# Patient Record
Sex: Female | Born: 1945 | Race: White | Hispanic: No | Smoking: Never smoker
Health system: Southern US, Community
[De-identification: ages and names within clinical notes are randomized; demographics above are authoritative.]

## PROBLEM LIST (undated history)

## (undated) DIAGNOSIS — E039 Hypothyroidism, unspecified: Secondary | ICD-10-CM

## (undated) DIAGNOSIS — N289 Disorder of kidney and ureter, unspecified: Secondary | ICD-10-CM

## (undated) DIAGNOSIS — F32A Depression, unspecified: Secondary | ICD-10-CM

## (undated) DIAGNOSIS — C9000 Multiple myeloma not having achieved remission: Secondary | ICD-10-CM

## (undated) DIAGNOSIS — K219 Gastro-esophageal reflux disease without esophagitis: Secondary | ICD-10-CM

## (undated) DIAGNOSIS — F419 Anxiety disorder, unspecified: Secondary | ICD-10-CM

## (undated) DIAGNOSIS — F329 Major depressive disorder, single episode, unspecified: Secondary | ICD-10-CM

## (undated) DIAGNOSIS — G8929 Other chronic pain: Secondary | ICD-10-CM

## (undated) DIAGNOSIS — N3281 Overactive bladder: Secondary | ICD-10-CM

## (undated) HISTORY — PX: LIMBAL STEM CELL TRANSPLANT: SHX1969

## (undated) HISTORY — PX: TONSILLECTOMY: SUR1361

## (undated) HISTORY — PX: CHOLECYSTECTOMY: SHX55

## (undated) HISTORY — PX: LEG SURGERY: SHX1003

## (undated) HISTORY — PX: BREAST SURGERY: SHX581

## (undated) HISTORY — PX: ABDOMINAL HYSTERECTOMY: SHX81

---

## 2007-07-31 ENCOUNTER — Ambulatory Visit: Payer: Self-pay | Admitting: Hematology & Oncology

## 2007-09-08 LAB — CBC WITH DIFFERENTIAL/PLATELET
BASO%: 0 % (ref 0.0–2.0)
LYMPH%: 23.3 % (ref 14.0–48.0)
MCHC: 34.6 g/dL (ref 32.0–36.0)
MONO#: 0.6 10*3/uL (ref 0.1–0.9)
Platelets: 290 10*3/uL (ref 145–400)
RBC: 3.35 10*6/uL — ABNORMAL LOW (ref 3.70–5.32)
RDW: 14.1 % (ref 11.3–14.5)
WBC: 4.7 10*3/uL (ref 3.9–10.0)

## 2007-09-08 LAB — CHCC SMEAR

## 2007-09-12 LAB — SPEP & IFE WITH QIG
Beta Globulin: 5.9 % (ref 4.7–7.2)
IgA: 9 mg/dL — ABNORMAL LOW (ref 68–378)
IgG (Immunoglobin G), Serum: 826 mg/dL (ref 694–1618)
Total Protein, Serum Electrophoresis: 6.3 g/dL (ref 6.0–8.3)

## 2007-09-12 LAB — COMPREHENSIVE METABOLIC PANEL
ALT: 21 U/L (ref 0–35)
AST: 17 U/L (ref 0–37)
Calcium: 9.3 mg/dL (ref 8.4–10.5)
Chloride: 105 mEq/L (ref 96–112)
Creatinine, Ser: 1.11 mg/dL (ref 0.40–1.20)
Total Bilirubin: 0.3 mg/dL (ref 0.3–1.2)

## 2007-10-04 ENCOUNTER — Ambulatory Visit: Payer: Self-pay | Admitting: Hematology & Oncology

## 2007-10-06 LAB — CBC & DIFF AND RETIC
BASO%: 0.4 % (ref 0.0–2.0)
EOS%: 2.9 % (ref 0.0–7.0)
LYMPH%: 25.4 % (ref 14.0–48.0)
MCH: 31 pg (ref 26.0–34.0)
MCHC: 34.4 g/dL (ref 32.0–36.0)
MONO#: 0.6 10*3/uL (ref 0.1–0.9)
Platelets: 330 10*3/uL (ref 145–400)
RBC: 3.3 10*6/uL — ABNORMAL LOW (ref 3.70–5.32)
Retic %: 2.2 % (ref 0.4–2.3)
WBC: 4.3 10*3/uL (ref 3.9–10.0)

## 2007-10-06 LAB — CHCC SMEAR

## 2007-10-10 LAB — COMPREHENSIVE METABOLIC PANEL
Albumin: 4.1 g/dL (ref 3.5–5.2)
Alkaline Phosphatase: 62 U/L (ref 39–117)
BUN: 18 mg/dL (ref 6–23)
Glucose, Bld: 134 mg/dL — ABNORMAL HIGH (ref 70–99)
Total Bilirubin: 0.5 mg/dL (ref 0.3–1.2)

## 2007-10-10 LAB — PROTEIN ELECTROPHORESIS, SERUM
Albumin ELP: 60.3 % (ref 55.8–66.1)
Alpha-1-Globulin: 5.7 % — ABNORMAL HIGH (ref 2.9–4.9)
Gamma Globulin: 10.2 % — ABNORMAL LOW (ref 11.1–18.8)

## 2007-10-10 LAB — IGG, IGA, IGM
IgA: 17 mg/dL — ABNORMAL LOW (ref 68–378)
IgM, Serum: 53 mg/dL — ABNORMAL LOW (ref 60–263)

## 2007-10-10 LAB — KAPPA/LAMBDA LIGHT CHAINS: Kappa free light chain: 5.94 mg/dL — ABNORMAL HIGH (ref 0.33–1.94)

## 2007-10-27 LAB — BASIC METABOLIC PANEL
CO2: 28 mEq/L (ref 19–32)
Calcium: 9.7 mg/dL (ref 8.4–10.5)
Creatinine, Ser: 1.08 mg/dL (ref 0.40–1.20)
Sodium: 143 mEq/L (ref 135–145)

## 2007-10-27 LAB — CBC WITH DIFFERENTIAL/PLATELET
BASO%: 0.2 % (ref 0.0–2.0)
EOS%: 2.6 % (ref 0.0–7.0)
LYMPH%: 20.7 % (ref 14.0–48.0)
MCH: 29.8 pg (ref 26.0–34.0)
MCHC: 33 g/dL (ref 32.0–36.0)
MONO#: 0.6 10*3/uL (ref 0.1–0.9)
MONO%: 9 % (ref 0.0–13.0)
Platelets: 354 10*3/uL (ref 145–400)
RBC: 3.64 10*6/uL — ABNORMAL LOW (ref 3.70–5.32)
WBC: 6.2 10*3/uL (ref 3.9–10.0)

## 2007-11-06 ENCOUNTER — Ambulatory Visit (HOSPITAL_COMMUNITY): Admission: RE | Admit: 2007-11-06 | Discharge: 2007-11-06 | Payer: Self-pay | Admitting: Hematology & Oncology

## 2007-11-14 ENCOUNTER — Ambulatory Visit: Payer: Self-pay | Admitting: Hematology & Oncology

## 2007-11-21 ENCOUNTER — Encounter: Admission: RE | Admit: 2007-11-21 | Discharge: 2008-01-31 | Payer: Self-pay | Admitting: Orthopaedic Surgery

## 2007-12-01 ENCOUNTER — Ambulatory Visit (HOSPITAL_COMMUNITY): Admission: RE | Admit: 2007-12-01 | Discharge: 2007-12-01 | Payer: Self-pay | Admitting: Hematology & Oncology

## 2007-12-04 LAB — CHCC SMEAR

## 2007-12-04 LAB — CBC WITH DIFFERENTIAL/PLATELET
BASO%: 0.4 % (ref 0.0–2.0)
EOS%: 3.4 % (ref 0.0–7.0)
HCT: 33.5 % — ABNORMAL LOW (ref 34.8–46.6)
HGB: 11.2 g/dL — ABNORMAL LOW (ref 11.6–15.9)
MCV: 89.3 fL (ref 81.0–101.0)
MONO#: 0.3 10*3/uL (ref 0.1–0.9)
NEUT#: 2.7 10*3/uL (ref 1.5–6.5)
NEUT%: 60.6 % (ref 39.6–76.8)
Platelets: 292 10*3/uL (ref 145–400)
lymph#: 1.3 10*3/uL (ref 0.9–3.3)

## 2007-12-04 LAB — TSH: TSH: 7.144 u[IU]/mL — ABNORMAL HIGH (ref 0.350–5.500)

## 2007-12-06 LAB — KAPPA/LAMBDA LIGHT CHAINS
Kappa free light chain: 5.23 mg/dL — ABNORMAL HIGH (ref 0.33–1.94)
Lambda Free Lght Chn: <0.72 mg/dL (ref 0.57–2.63)

## 2007-12-28 ENCOUNTER — Ambulatory Visit: Payer: Self-pay | Admitting: Hematology & Oncology

## 2008-01-01 LAB — CBC WITH DIFFERENTIAL/PLATELET
BASO%: 0.2 % (ref 0.0–2.0)
Eosinophils Absolute: 0.1 10*3/uL (ref 0.0–0.5)
HCT: 31.4 % — ABNORMAL LOW (ref 34.8–46.6)
LYMPH%: 22.2 % (ref 14.0–48.0)
MCHC: 33.8 g/dL (ref 32.0–36.0)
MCV: 88.6 fL (ref 81.0–101.0)
MONO#: 0.6 10*3/uL (ref 0.1–0.9)
MONO%: 9 % (ref 0.0–13.0)
NEUT%: 67.2 % (ref 39.6–76.8)
Platelets: 257 10*3/uL (ref 145–400)
RBC: 3.54 10*6/uL — ABNORMAL LOW (ref 3.70–5.32)
WBC: 6.9 10*3/uL (ref 3.9–10.0)

## 2008-01-15 LAB — CBC WITH DIFFERENTIAL/PLATELET
BASO%: 0.4 % (ref 0.0–2.0)
EOS%: 3.6 % (ref 0.0–7.0)
HCT: 35.9 % (ref 34.8–46.6)
LYMPH%: 30.9 % (ref 14.0–48.0)
MCH: 29.7 pg (ref 26.0–34.0)
MCHC: 32.5 g/dL (ref 32.0–36.0)
NEUT%: 52.4 % (ref 39.6–76.8)
Platelets: 274 10*3/uL (ref 145–400)
RBC: 3.92 10*6/uL (ref 3.70–5.32)

## 2008-01-17 LAB — PROTEIN ELECTROPHORESIS, SERUM
Albumin ELP: 60.3 % (ref 55.8–66.1)
Alpha-1-Globulin: 5.3 % — ABNORMAL HIGH (ref 2.9–4.9)
Alpha-2-Globulin: 12 % — ABNORMAL HIGH (ref 7.1–11.8)
Beta Globulin: 6.8 % (ref 4.7–7.2)
Total Protein, Serum Electrophoresis: 6.6 g/dL (ref 6.0–8.3)

## 2008-01-17 LAB — COMPREHENSIVE METABOLIC PANEL
ALT: 24 U/L (ref 0–35)
AST: 23 U/L (ref 0–37)
Alkaline Phosphatase: 55 U/L (ref 39–117)
Creatinine, Ser: 1.11 mg/dL (ref 0.40–1.20)
Sodium: 138 mEq/L (ref 135–145)
Total Bilirubin: 0.6 mg/dL (ref 0.3–1.2)

## 2008-02-09 ENCOUNTER — Ambulatory Visit: Payer: Self-pay | Admitting: Hematology & Oncology

## 2008-02-13 LAB — CBC WITH DIFFERENTIAL/PLATELET
Basophils Absolute: 0 10*3/uL (ref 0.0–0.1)
Eosinophils Absolute: 0.1 10*3/uL (ref 0.0–0.5)
HCT: 35 % (ref 34.8–46.6)
HGB: 11.6 g/dL (ref 11.6–15.9)
LYMPH%: 26.5 % (ref 14.0–48.0)
MCV: 87.5 fL (ref 81.0–101.0)
MONO#: 0.5 10*3/uL (ref 0.1–0.9)
MONO%: 9.8 % (ref 0.0–13.0)
NEUT#: 3.3 10*3/uL (ref 1.5–6.5)
NEUT%: 61.3 % (ref 39.6–76.8)
Platelets: 265 10*3/uL (ref 145–400)
RBC: 4 10*6/uL (ref 3.70–5.32)
WBC: 5.4 10*3/uL (ref 3.9–10.0)

## 2008-02-26 LAB — CBC WITH DIFFERENTIAL/PLATELET
BASO%: 0.5 % (ref 0.0–2.0)
EOS%: 2.6 % (ref 0.0–7.0)
HCT: 34.8 % (ref 34.8–46.6)
LYMPH%: 21.4 % (ref 14.0–48.0)
MCH: 29.1 pg (ref 26.0–34.0)
MCHC: 33.3 g/dL (ref 32.0–36.0)
MCV: 87.5 fL (ref 81.0–101.0)
MONO%: 9.5 % (ref 0.0–13.0)
NEUT%: 66 % (ref 39.6–76.8)
lymph#: 1.1 10*3/uL (ref 0.9–3.3)

## 2008-02-26 LAB — COMPREHENSIVE METABOLIC PANEL
ALT: 15 U/L (ref 0–35)
AST: 18 U/L (ref 0–37)
Alkaline Phosphatase: 54 U/L (ref 39–117)
BUN: 16 mg/dL (ref 6–23)
Creatinine, Ser: 1.03 mg/dL (ref 0.40–1.20)
Total Bilirubin: 0.4 mg/dL (ref 0.3–1.2)

## 2008-02-28 LAB — BETA 2 MICROGLOBULIN, SERUM: Beta-2 Microglobulin: 3.3 mg/L — ABNORMAL HIGH (ref 1.01–1.73)

## 2008-02-28 LAB — PROTEIN ELECTROPHORESIS, SERUM
Albumin ELP: 61.5 % (ref 55.8–66.1)
Alpha-1-Globulin: 5.2 % — ABNORMAL HIGH (ref 2.9–4.9)
Gamma Globulin: 10 % — ABNORMAL LOW (ref 11.1–18.8)

## 2008-02-28 LAB — KAPPA/LAMBDA LIGHT CHAINS

## 2008-02-28 LAB — IGG, IGA, IGM: IgG (Immunoglobin G), Serum: 711 mg/dL (ref 694–1618)

## 2008-03-25 LAB — CBC WITH DIFFERENTIAL/PLATELET
Eosinophils Absolute: 0.2 10*3/uL (ref 0.0–0.5)
HCT: 34.2 % — ABNORMAL LOW (ref 34.8–46.6)
LYMPH%: 25.5 % (ref 14.0–48.0)
MCHC: 33.4 g/dL (ref 32.0–36.0)
MCV: 89.3 fL (ref 81.0–101.0)
MONO#: 0.7 10*3/uL (ref 0.1–0.9)
MONO%: 10.6 % (ref 0.0–13.0)
NEUT#: 3.8 10*3/uL (ref 1.5–6.5)
NEUT%: 61 % (ref 39.6–76.8)
Platelets: 255 10*3/uL (ref 145–400)
RBC: 3.83 10*6/uL (ref 3.70–5.32)
WBC: 6.2 10*3/uL (ref 3.9–10.0)

## 2008-04-17 ENCOUNTER — Ambulatory Visit: Payer: Self-pay | Admitting: Hematology & Oncology

## 2008-05-14 ENCOUNTER — Ambulatory Visit: Payer: Self-pay | Admitting: Hematology & Oncology

## 2008-05-20 LAB — CBC WITH DIFFERENTIAL (CANCER CENTER ONLY)
BASO%: 0.3 % (ref 0.0–2.0)
EOS%: 4.6 % (ref 0.0–7.0)
HCT: 31.3 % — ABNORMAL LOW (ref 34.8–46.6)
LYMPH%: 25.3 % (ref 14.0–48.0)
MCH: 29.4 pg (ref 26.0–34.0)
MCHC: 33.4 g/dL (ref 32.0–36.0)
MCV: 88 fL (ref 81–101)
MONO%: 11.5 % (ref 0.0–13.0)
NEUT%: 58.3 % (ref 39.6–80.0)
Platelets: 321 10*3/uL (ref 145–400)
RDW: 15.7 % — ABNORMAL HIGH (ref 10.5–14.6)

## 2008-05-22 LAB — COMPREHENSIVE METABOLIC PANEL
Alkaline Phosphatase: 51 U/L (ref 39–117)
BUN: 15 mg/dL (ref 6–23)
Glucose, Bld: 107 mg/dL — ABNORMAL HIGH (ref 70–99)
Sodium: 142 mEq/L (ref 135–145)
Total Bilirubin: 0.4 mg/dL (ref 0.3–1.2)
Total Protein: 6.4 g/dL (ref 6.0–8.3)

## 2008-05-22 LAB — PROTEIN ELECTROPHORESIS, SERUM
Alpha-1-Globulin: 4.8 % (ref 2.9–4.9)
Alpha-2-Globulin: 11.6 % (ref 7.1–11.8)
Beta 2: 5.1 % (ref 3.2–6.5)
Beta Globulin: 6.7 % (ref 4.7–7.2)
Gamma Globulin: 10.1 % — ABNORMAL LOW (ref 11.1–18.8)

## 2008-05-30 ENCOUNTER — Encounter: Admission: RE | Admit: 2008-05-30 | Discharge: 2008-05-30 | Payer: Self-pay | Admitting: Hematology & Oncology

## 2008-06-10 ENCOUNTER — Encounter: Admission: RE | Admit: 2008-06-10 | Discharge: 2008-09-08 | Payer: Self-pay | Admitting: Orthopaedic Surgery

## 2008-06-17 ENCOUNTER — Ambulatory Visit: Payer: Self-pay | Admitting: Hematology & Oncology

## 2008-06-17 LAB — CBC WITH DIFFERENTIAL/PLATELET
Basophils Absolute: 0 10*3/uL (ref 0.0–0.1)
Eosinophils Absolute: 0.1 10*3/uL (ref 0.0–0.5)
HGB: 11.8 g/dL (ref 11.6–15.9)
LYMPH%: 22.9 % (ref 14.0–48.0)
MCV: 91.9 fL (ref 81.0–101.0)
MONO#: 0.5 10*3/uL (ref 0.1–0.9)
MONO%: 7.9 % (ref 0.0–13.0)
NEUT#: 4.2 10*3/uL (ref 1.5–6.5)
Platelets: 278 10*3/uL (ref 145–400)
RBC: 3.94 10*6/uL (ref 3.70–5.32)
WBC: 6.3 10*3/uL (ref 3.9–10.0)

## 2008-07-12 ENCOUNTER — Ambulatory Visit: Payer: Self-pay | Admitting: Hematology & Oncology

## 2008-07-18 LAB — CBC WITH DIFFERENTIAL (CANCER CENTER ONLY)
Eosinophils Absolute: 0.1 10*3/uL (ref 0.0–0.5)
HCT: 30 % — ABNORMAL LOW (ref 34.8–46.6)
LYMPH%: 28.3 % (ref 14.0–48.0)
MCH: 30.2 pg (ref 26.0–34.0)
MCV: 88 fL (ref 81–101)
MONO#: 0.5 10*3/uL (ref 0.1–0.9)
MONO%: 8.8 % (ref 0.0–13.0)
NEUT%: 60.3 % (ref 39.6–80.0)
Platelets: 262 10*3/uL (ref 145–400)
RBC: 3.41 10*6/uL — ABNORMAL LOW (ref 3.70–5.32)
WBC: 6 10*3/uL (ref 3.9–10.0)

## 2008-07-22 LAB — PROTEIN ELECTROPHORESIS, SERUM
Albumin ELP: 59.9 % (ref 55.8–66.1)
Beta Globulin: 7.5 % — ABNORMAL HIGH (ref 4.7–7.2)
Total Protein, Serum Electrophoresis: 5.6 g/dL — ABNORMAL LOW (ref 6.0–8.3)

## 2008-07-22 LAB — IGG, IGA, IGM
IgA: 8 mg/dL — ABNORMAL LOW (ref 68–378)
IgG (Immunoglobin G), Serum: 575 mg/dL — ABNORMAL LOW (ref 694–1618)

## 2008-07-22 LAB — KAPPA/LAMBDA LIGHT CHAINS
Kappa free light chain: 3.29 mg/dL — ABNORMAL HIGH (ref 0.33–1.94)
Kappa:Lambda Ratio: 3.32 — ABNORMAL HIGH (ref 0.26–1.65)

## 2008-07-22 LAB — COMPREHENSIVE METABOLIC PANEL
ALT: 21 U/L (ref 0–35)
AST: 21 U/L (ref 0–37)
BUN: 21 mg/dL (ref 6–23)
CO2: 25 mEq/L (ref 19–32)
Calcium: 8.6 mg/dL (ref 8.4–10.5)
Chloride: 104 mEq/L (ref 96–112)
Creatinine, Ser: 0.96 mg/dL (ref 0.40–1.20)
Total Bilirubin: 0.4 mg/dL (ref 0.3–1.2)

## 2008-08-22 LAB — CBC WITH DIFFERENTIAL (CANCER CENTER ONLY)
BASO#: 0 10*3/uL (ref 0.0–0.2)
Eosinophils Absolute: 0.2 10*3/uL (ref 0.0–0.5)
HGB: 11.5 g/dL — ABNORMAL LOW (ref 11.6–15.9)
LYMPH%: 22.5 % (ref 14.0–48.0)
MCH: 30.3 pg (ref 26.0–34.0)
MCV: 91 fL (ref 81–101)
MONO#: 0.6 10*3/uL (ref 0.1–0.9)
MONO%: 8.5 % (ref 0.0–13.0)
NEUT#: 4.4 10*3/uL (ref 1.5–6.5)
Platelets: 280 10*3/uL (ref 145–400)
RBC: 3.79 10*6/uL (ref 3.70–5.32)
WBC: 6.6 10*3/uL (ref 3.9–10.0)

## 2008-09-03 ENCOUNTER — Ambulatory Visit: Payer: Self-pay | Admitting: Hematology & Oncology

## 2008-09-19 LAB — CBC WITH DIFFERENTIAL (CANCER CENTER ONLY)
BASO%: 0.4 % (ref 0.0–2.0)
EOS%: 2.5 % (ref 0.0–7.0)
LYMPH#: 1.6 10*3/uL (ref 0.9–3.3)
LYMPH%: 21.1 % (ref 14.0–48.0)
MCHC: 34.1 g/dL (ref 32.0–36.0)
MONO#: 0.7 10*3/uL (ref 0.1–0.9)
Platelets: 287 10*3/uL (ref 145–400)
RDW: 12.6 % (ref 10.5–14.6)
WBC: 7.5 10*3/uL (ref 3.9–10.0)

## 2008-11-08 ENCOUNTER — Ambulatory Visit: Payer: Self-pay | Admitting: Hematology & Oncology

## 2008-11-14 ENCOUNTER — Encounter: Admission: RE | Admit: 2008-11-14 | Discharge: 2009-01-09 | Payer: Self-pay | Admitting: Orthopaedic Surgery

## 2009-01-03 ENCOUNTER — Ambulatory Visit: Payer: Self-pay | Admitting: Hematology & Oncology

## 2009-01-06 LAB — CBC WITH DIFFERENTIAL (CANCER CENTER ONLY)
BASO%: 0.2 % (ref 0.0–2.0)
LYMPH%: 28.7 % (ref 14.0–48.0)
MCH: 30.1 pg (ref 26.0–34.0)
MCHC: 33.3 g/dL (ref 32.0–36.0)
MCV: 90 fL (ref 81–101)
MONO%: 8.4 % (ref 0.0–13.0)
Platelets: 295 10*3/uL (ref 145–400)
RDW: 12.1 % (ref 10.5–14.6)

## 2009-01-29 ENCOUNTER — Encounter: Admission: RE | Admit: 2009-01-29 | Discharge: 2009-01-29 | Payer: Self-pay | Admitting: Internal Medicine

## 2009-02-28 ENCOUNTER — Ambulatory Visit: Payer: Self-pay | Admitting: Hematology & Oncology

## 2009-03-03 LAB — CBC WITH DIFFERENTIAL (CANCER CENTER ONLY)
BASO#: 0 10*3/uL (ref 0.0–0.2)
Eosinophils Absolute: 0.2 10*3/uL (ref 0.0–0.5)
LYMPH%: 32.6 % (ref 14.0–48.0)
MCH: 29.4 pg (ref 26.0–34.0)
MCV: 87 fL (ref 81–101)
MONO%: 7.9 % (ref 0.0–13.0)
NEUT%: 54 % (ref 39.6–80.0)
Platelets: 259 10*3/uL (ref 145–400)
RBC: 3.59 10*6/uL — ABNORMAL LOW (ref 3.70–5.32)

## 2009-03-05 LAB — IGG, IGA, IGM
IgA: 12 mg/dL — ABNORMAL LOW (ref 68–378)
IgM, Serum: 20 mg/dL — ABNORMAL LOW (ref 60–263)

## 2009-03-05 LAB — KAPPA/LAMBDA LIGHT CHAINS: Lambda Free Lght Chn: 0.76 mg/dL (ref 0.57–2.63)

## 2009-03-05 LAB — COMPREHENSIVE METABOLIC PANEL
ALT: 14 U/L (ref 0–35)
Alkaline Phosphatase: 52 U/L (ref 39–117)
Creatinine, Ser: 1.13 mg/dL (ref 0.40–1.20)
Sodium: 143 mEq/L (ref 135–145)
Total Bilirubin: 0.3 mg/dL (ref 0.3–1.2)
Total Protein: 6.3 g/dL (ref 6.0–8.3)

## 2009-03-05 LAB — PROTEIN ELECTROPHORESIS, SERUM
Albumin ELP: 60.6 % (ref 55.8–66.1)
Total Protein, Serum Electrophoresis: 6.3 g/dL (ref 6.0–8.3)

## 2009-04-03 ENCOUNTER — Encounter: Payer: Self-pay | Admitting: Hematology & Oncology

## 2009-04-03 ENCOUNTER — Ambulatory Visit (HOSPITAL_COMMUNITY): Admission: RE | Admit: 2009-04-03 | Discharge: 2009-04-03 | Payer: Self-pay | Admitting: Hematology & Oncology

## 2009-04-03 ENCOUNTER — Ambulatory Visit: Payer: Self-pay | Admitting: Vascular Surgery

## 2009-05-21 ENCOUNTER — Encounter: Admission: RE | Admit: 2009-05-21 | Discharge: 2009-06-18 | Payer: Self-pay | Admitting: Internal Medicine

## 2009-06-13 ENCOUNTER — Encounter: Admission: RE | Admit: 2009-06-13 | Discharge: 2009-06-13 | Payer: Self-pay | Admitting: Family Medicine

## 2009-06-19 ENCOUNTER — Ambulatory Visit (HOSPITAL_COMMUNITY): Admission: RE | Admit: 2009-06-19 | Discharge: 2009-06-19 | Payer: Self-pay | Admitting: Hematology & Oncology

## 2009-06-19 ENCOUNTER — Ambulatory Visit: Payer: Self-pay | Admitting: Hematology & Oncology

## 2009-06-25 LAB — CBC WITH DIFFERENTIAL (CANCER CENTER ONLY)
BASO#: 0 10*3/uL (ref 0.0–0.2)
Eosinophils Absolute: 0.2 10*3/uL (ref 0.0–0.5)
HGB: 9.9 g/dL — ABNORMAL LOW (ref 11.6–15.9)
MCH: 30.1 pg (ref 26.0–34.0)
MCV: 90 fL (ref 81–101)
MONO%: 9.4 % (ref 0.0–13.0)
NEUT#: 2.9 10*3/uL (ref 1.5–6.5)
RBC: 3.29 10*6/uL — ABNORMAL LOW (ref 3.70–5.32)

## 2009-06-27 LAB — SPEP & IFE WITH QIG
Gamma Globulin: 9.4 % — ABNORMAL LOW (ref 11.1–18.8)
IgA: 10 mg/dL — ABNORMAL LOW (ref 68–378)
IgG (Immunoglobin G), Serum: 709 mg/dL (ref 694–1618)
IgM, Serum: 15 mg/dL — ABNORMAL LOW (ref 60–263)

## 2009-06-27 LAB — COMPREHENSIVE METABOLIC PANEL
AST: 20 U/L (ref 0–37)
Alkaline Phosphatase: 53 U/L (ref 39–117)
BUN: 18 mg/dL (ref 6–23)
Creatinine, Ser: 1.05 mg/dL (ref 0.40–1.20)
Potassium: 4.2 mEq/L (ref 3.5–5.3)
Total Bilirubin: 0.4 mg/dL (ref 0.3–1.2)

## 2009-06-27 LAB — KAPPA/LAMBDA LIGHT CHAINS: Kappa:Lambda Ratio: 5.52 — ABNORMAL HIGH (ref 0.26–1.65)

## 2009-07-10 ENCOUNTER — Encounter (INDEPENDENT_AMBULATORY_CARE_PROVIDER_SITE_OTHER): Payer: Self-pay | Admitting: Interventional Radiology

## 2009-07-10 ENCOUNTER — Ambulatory Visit (HOSPITAL_COMMUNITY): Admission: RE | Admit: 2009-07-10 | Discharge: 2009-07-10 | Payer: Self-pay | Admitting: Hematology & Oncology

## 2009-07-24 ENCOUNTER — Ambulatory Visit: Payer: Self-pay | Admitting: Hematology & Oncology

## 2009-07-28 LAB — UIFE/LIGHT CHAINS/TP QN, 24-HR UR
Alpha 1, Urine: DETECTED — AB
Free Kappa Lt Chains,Ur: 6.06 mg/dL — ABNORMAL HIGH (ref 0.04–1.51)
Free Lambda Excretion/Day: 3.24 mg/d
Free Lt Chn Excr Rate: 81.81 mg/d
Gamma Globulin, Urine: DETECTED — AB
Time: 24 hours
Total Protein, Urine: 7.1 mg/dL

## 2009-07-29 ENCOUNTER — Inpatient Hospital Stay (HOSPITAL_COMMUNITY)
Admission: RE | Admit: 2009-07-29 | Discharge: 2009-08-08 | Payer: Self-pay | Admitting: Physical Medicine & Rehabilitation

## 2009-07-29 ENCOUNTER — Ambulatory Visit: Payer: Self-pay | Admitting: Physical Medicine & Rehabilitation

## 2009-08-02 ENCOUNTER — Ambulatory Visit: Payer: Self-pay | Admitting: Physical Medicine & Rehabilitation

## 2009-08-21 LAB — CBC WITH DIFFERENTIAL (CANCER CENTER ONLY)
BASO%: 0.5 % (ref 0.0–2.0)
Eosinophils Absolute: 0.4 10*3/uL (ref 0.0–0.5)
LYMPH#: 1.9 10*3/uL (ref 0.9–3.3)
MCV: 83 fL (ref 81–101)
MONO#: 0.5 10*3/uL (ref 0.1–0.9)
NEUT#: 3.6 10*3/uL (ref 1.5–6.5)
Platelets: 240 10*3/uL (ref 145–400)
RBC: 4.24 10*6/uL (ref 3.70–5.32)
RDW: 12.9 % (ref 10.5–14.6)
WBC: 6.4 10*3/uL (ref 3.9–10.0)

## 2009-08-27 ENCOUNTER — Ambulatory Visit: Payer: Self-pay | Admitting: Hematology & Oncology

## 2009-08-28 ENCOUNTER — Ambulatory Visit (HOSPITAL_BASED_OUTPATIENT_CLINIC_OR_DEPARTMENT_OTHER): Admission: RE | Admit: 2009-08-28 | Discharge: 2009-08-28 | Payer: Self-pay | Admitting: Hematology & Oncology

## 2009-08-28 ENCOUNTER — Ambulatory Visit: Payer: Self-pay | Admitting: Radiology

## 2009-08-28 LAB — BASIC METABOLIC PANEL - CANCER CENTER ONLY
BUN, Bld: 12 mg/dL (ref 7–22)
CO2: 28 mEq/L (ref 18–33)
Calcium: 8.8 mg/dL (ref 8.0–10.3)
Creat: 1 mg/dl (ref 0.6–1.2)
Glucose, Bld: 94 mg/dL (ref 73–118)
Sodium: 139 mEq/L (ref 128–145)

## 2009-08-28 LAB — CBC WITH DIFFERENTIAL (CANCER CENTER ONLY)
BASO#: 0.1 10*3/uL (ref 0.0–0.2)
BASO%: 0.9 % (ref 0.0–2.0)
Eosinophils Absolute: 0.3 10*3/uL (ref 0.0–0.5)
HCT: 34.1 % — ABNORMAL LOW (ref 34.8–46.6)
HGB: 11.8 g/dL (ref 11.6–15.9)
LYMPH#: 1.3 10*3/uL (ref 0.9–3.3)
LYMPH%: 16.2 % (ref 14.0–48.0)
MCV: 83 fL (ref 81–101)
MONO#: 0.6 10*3/uL (ref 0.1–0.9)
NEUT%: 72.1 % (ref 39.6–80.0)
RBC: 4.13 10*6/uL (ref 3.70–5.32)
RDW: 13.3 % (ref 10.5–14.6)
WBC: 8 10*3/uL (ref 3.9–10.0)

## 2009-08-28 LAB — PROTIME-INR (CHCC SATELLITE): Protime: 12 Seconds (ref 10.6–13.4)

## 2009-09-04 LAB — CBC WITH DIFFERENTIAL (CANCER CENTER ONLY)
BASO#: 0.1 10*3/uL (ref 0.0–0.2)
EOS%: 2.3 % (ref 0.0–7.0)
Eosinophils Absolute: 0.2 10*3/uL (ref 0.0–0.5)
HGB: 11 g/dL — ABNORMAL LOW (ref 11.6–15.9)
LYMPH#: 1.8 10*3/uL (ref 0.9–3.3)
MONO#: 1 10*3/uL — ABNORMAL HIGH (ref 0.1–0.9)
NEUT#: 3.7 10*3/uL (ref 1.5–6.5)
RBC: 3.9 10*6/uL (ref 3.70–5.32)
WBC: 6.7 10*3/uL (ref 3.9–10.0)

## 2009-09-04 LAB — PROTIME-INR (CHCC SATELLITE): Protime: 12 Seconds (ref 10.6–13.4)

## 2009-09-10 LAB — CBC WITH DIFFERENTIAL (CANCER CENTER ONLY)
BASO%: 0.6 % (ref 0.0–2.0)
EOS%: 3.5 % (ref 0.0–7.0)
LYMPH#: 1.7 10*3/uL (ref 0.9–3.3)
MCHC: 34 g/dL (ref 32.0–36.0)
NEUT#: 3.1 10*3/uL (ref 1.5–6.5)
RDW: 14.4 % (ref 10.5–14.6)

## 2009-09-10 LAB — PROTIME-INR (CHCC SATELLITE)
INR: 1 — ABNORMAL LOW (ref 2.0–3.5)
Protime: 12 Seconds (ref 10.6–13.4)

## 2009-09-26 ENCOUNTER — Ambulatory Visit: Payer: Self-pay | Admitting: Hematology & Oncology

## 2009-09-26 LAB — CBC WITH DIFFERENTIAL (CANCER CENTER ONLY)
EOS%: 2.6 % (ref 0.0–7.0)
LYMPH%: 28 % (ref 14.0–48.0)
MCH: 29 pg (ref 26.0–34.0)
MCHC: 34.2 g/dL (ref 32.0–36.0)
MCV: 85 fL (ref 81–101)
MONO%: 18.1 % — ABNORMAL HIGH (ref 0.0–13.0)
NEUT#: 2.7 10*3/uL (ref 1.5–6.5)
Platelets: 211 10*3/uL (ref 145–400)

## 2009-10-03 LAB — CBC WITH DIFFERENTIAL (CANCER CENTER ONLY)
BASO%: 0.4 % (ref 0.0–2.0)
Eosinophils Absolute: 0.2 10*3/uL (ref 0.0–0.5)
LYMPH%: 32 % (ref 14.0–48.0)
MCH: 29.1 pg (ref 26.0–34.0)
MCV: 85 fL (ref 81–101)
MONO%: 11.2 % (ref 0.0–13.0)
NEUT#: 2.1 10*3/uL (ref 1.5–6.5)
Platelets: 126 10*3/uL — ABNORMAL LOW (ref 145–400)
RBC: 3.43 10*6/uL — ABNORMAL LOW (ref 3.70–5.32)
RDW: 17.4 % — ABNORMAL HIGH (ref 10.5–14.6)
WBC: 4 10*3/uL (ref 3.9–10.0)

## 2009-10-06 ENCOUNTER — Ambulatory Visit (HOSPITAL_COMMUNITY): Admission: RE | Admit: 2009-10-06 | Discharge: 2009-10-06 | Payer: Self-pay | Admitting: Hematology & Oncology

## 2009-10-15 LAB — CBC WITH DIFFERENTIAL (CANCER CENTER ONLY)
BASO#: 0 10*3/uL (ref 0.0–0.2)
EOS%: 1.2 % (ref 0.0–7.0)
HCT: 32 % — ABNORMAL LOW (ref 34.8–46.6)
HGB: 10.7 g/dL — ABNORMAL LOW (ref 11.6–15.9)
LYMPH#: 1.7 10*3/uL (ref 0.9–3.3)
LYMPH%: 43.5 % (ref 14.0–48.0)
MCHC: 33.5 g/dL (ref 32.0–36.0)
MCV: 88 fL (ref 81–101)
NEUT%: 44.2 % (ref 39.6–80.0)
RDW: 18.5 % — ABNORMAL HIGH (ref 10.5–14.6)

## 2009-10-20 LAB — IGG, IGA, IGM: IgA: 41 mg/dL — ABNORMAL LOW (ref 68–378)

## 2009-10-20 LAB — COMPREHENSIVE METABOLIC PANEL
ALT: 19 U/L (ref 0–35)
Albumin: 3.9 g/dL (ref 3.5–5.2)
CO2: 23 mEq/L (ref 19–32)
Potassium: 4.3 mEq/L (ref 3.5–5.3)
Sodium: 142 mEq/L (ref 135–145)
Total Bilirubin: 0.5 mg/dL (ref 0.3–1.2)
Total Protein: 5.7 g/dL — ABNORMAL LOW (ref 6.0–8.3)

## 2009-10-20 LAB — KAPPA/LAMBDA LIGHT CHAINS
Kappa free light chain: 3.42 mg/dL — ABNORMAL HIGH (ref 0.33–1.94)
Lambda Free Lght Chn: 0.51 mg/dL — ABNORMAL LOW (ref 0.57–2.63)

## 2009-10-20 LAB — PROTEIN ELECTROPHORESIS, SERUM: Total Protein, Serum Electrophoresis: 5.7 g/dL — ABNORMAL LOW (ref 6.0–8.3)

## 2009-10-27 ENCOUNTER — Ambulatory Visit: Payer: Self-pay | Admitting: Hematology & Oncology

## 2009-10-31 LAB — CBC WITH DIFFERENTIAL (CANCER CENTER ONLY)
BASO#: 0 10*3/uL (ref 0.0–0.2)
BASO%: 0.4 % (ref 0.0–2.0)
Eosinophils Absolute: 0.2 10*3/uL (ref 0.0–0.5)
HCT: 32.3 % — ABNORMAL LOW (ref 34.8–46.6)
LYMPH#: 1.1 10*3/uL (ref 0.9–3.3)
LYMPH%: 31.1 % (ref 14.0–48.0)
MCH: 30.2 pg (ref 26.0–34.0)
MONO%: 12.2 % (ref 0.0–13.0)
NEUT#: 1.9 10*3/uL (ref 1.5–6.5)
NEUT%: 52 % (ref 39.6–80.0)
RBC: 3.58 10*6/uL — ABNORMAL LOW (ref 3.70–5.32)
WBC: 3.6 10*3/uL — ABNORMAL LOW (ref 3.9–10.0)

## 2009-10-31 LAB — COMPREHENSIVE METABOLIC PANEL
ALT: 25 U/L (ref 0–35)
Alkaline Phosphatase: 66 U/L (ref 39–117)
Calcium: 7.7 mg/dL — ABNORMAL LOW (ref 8.4–10.5)
Potassium: 3.9 mEq/L (ref 3.5–5.3)
Sodium: 141 mEq/L (ref 135–145)
Total Bilirubin: 0.5 mg/dL (ref 0.3–1.2)

## 2009-11-03 LAB — KAPPA/LAMBDA LIGHT CHAINS
Kappa free light chain: 4.7 mg/dL — ABNORMAL HIGH (ref 0.33–1.94)
Lambda Free Lght Chn: 1 mg/dL (ref 0.57–2.63)

## 2009-11-05 LAB — CBC WITH DIFFERENTIAL (CANCER CENTER ONLY)
BASO%: 0.4 % (ref 0.0–2.0)
EOS%: 3.8 % (ref 0.0–7.0)
HCT: 30.8 % — ABNORMAL LOW (ref 34.8–46.6)
HGB: 10.3 g/dL — ABNORMAL LOW (ref 11.6–15.9)
MCV: 91 fL (ref 81–101)
MONO%: 15.4 % — ABNORMAL HIGH (ref 0.0–13.0)
NEUT#: 1.8 10*3/uL (ref 1.5–6.5)
NEUT%: 50.5 % (ref 39.6–80.0)
Platelets: 112 10*3/uL — ABNORMAL LOW (ref 145–400)
RBC: 3.38 10*6/uL — ABNORMAL LOW (ref 3.70–5.32)
WBC: 3.6 10*3/uL — ABNORMAL LOW (ref 3.9–10.0)

## 2009-11-05 LAB — BASIC METABOLIC PANEL
BUN: 9 mg/dL (ref 6–23)
CO2: 23 mEq/L (ref 19–32)
Glucose, Bld: 82 mg/dL (ref 70–99)
Potassium: 3.7 mEq/L (ref 3.5–5.3)

## 2009-11-10 ENCOUNTER — Ambulatory Visit (HOSPITAL_COMMUNITY): Admission: RE | Admit: 2009-11-10 | Discharge: 2009-11-10 | Payer: Self-pay | Admitting: Hematology & Oncology

## 2009-11-12 ENCOUNTER — Ambulatory Visit: Payer: Self-pay | Admitting: Hematology & Oncology

## 2009-11-12 ENCOUNTER — Inpatient Hospital Stay (HOSPITAL_COMMUNITY): Admission: AD | Admit: 2009-11-12 | Discharge: 2009-11-15 | Payer: Self-pay | Admitting: Hematology & Oncology

## 2009-11-12 LAB — CBC WITH DIFFERENTIAL (CANCER CENTER ONLY)
BASO#: 0.1 10*3/uL (ref 0.0–0.2)
BASO%: 1.9 % (ref 0.0–2.0)
Eosinophils Absolute: 0.1 10*3/uL (ref 0.0–0.5)
LYMPH%: 15.9 % (ref 14.0–48.0)
MCV: 92 fL (ref 81–101)
MONO#: 0.7 10*3/uL (ref 0.1–0.9)
MONO%: 20.6 % — ABNORMAL HIGH (ref 0.0–13.0)
NEUT#: 1.9 10*3/uL (ref 1.5–6.5)
RDW: 16.4 % — ABNORMAL HIGH (ref 10.5–14.6)

## 2009-11-15 ENCOUNTER — Ambulatory Visit: Payer: Self-pay | Admitting: Oncology

## 2009-11-19 LAB — CULTURE, BLOOD (SINGLE)

## 2009-11-19 LAB — CBC WITH DIFFERENTIAL (CANCER CENTER ONLY)
Eosinophils Absolute: 0.1 10*3/uL (ref 0.0–0.5)
HCT: 35.8 % (ref 34.8–46.6)
LYMPH%: 33.7 % (ref 14.0–48.0)
MCH: 31 pg (ref 26.0–34.0)
MCV: 92 fL (ref 81–101)
MONO#: 0.7 10*3/uL (ref 0.1–0.9)
MONO%: 18.4 % — ABNORMAL HIGH (ref 0.0–13.0)
NEUT%: 46 % (ref 39.6–80.0)
RBC: 3.89 10*6/uL (ref 3.70–5.32)
RDW: 14.8 % — ABNORMAL HIGH (ref 10.5–14.6)
WBC: 3.9 10*3/uL (ref 3.9–10.0)

## 2009-11-19 LAB — COMPREHENSIVE METABOLIC PANEL
CO2: 22 mEq/L (ref 19–32)
Creatinine, Ser: 0.75 mg/dL (ref 0.40–1.20)
Glucose, Bld: 134 mg/dL — ABNORMAL HIGH (ref 70–99)
Total Bilirubin: 0.4 mg/dL (ref 0.3–1.2)

## 2009-12-02 ENCOUNTER — Ambulatory Visit: Payer: Self-pay | Admitting: Hematology & Oncology

## 2009-12-03 LAB — CBC WITH DIFFERENTIAL (CANCER CENTER ONLY)
BASO#: 0 10*3/uL (ref 0.0–0.2)
Eosinophils Absolute: 0.1 10*3/uL (ref 0.0–0.5)
HCT: 36.4 % (ref 34.8–46.6)
HGB: 12.3 g/dL (ref 11.6–15.9)
LYMPH%: 29.2 % (ref 14.0–48.0)
MCV: 94 fL (ref 81–101)
MONO#: 0.5 10*3/uL (ref 0.1–0.9)
NEUT%: 57.2 % (ref 39.6–80.0)
RBC: 3.88 10*6/uL (ref 3.70–5.32)
WBC: 4.8 10*3/uL (ref 3.9–10.0)

## 2009-12-05 LAB — IGG, IGA, IGM: IgM, Serum: 52 mg/dL — ABNORMAL LOW (ref 60–263)

## 2009-12-05 LAB — PROTEIN ELECTROPHORESIS, SERUM
Albumin ELP: 59.3 % (ref 55.8–66.1)
Beta 2: 5.5 % (ref 3.2–6.5)
Gamma Globulin: 12.3 % (ref 11.1–18.8)

## 2009-12-05 LAB — COMPREHENSIVE METABOLIC PANEL
CO2: 22 mEq/L (ref 19–32)
Glucose, Bld: 119 mg/dL — ABNORMAL HIGH (ref 70–99)
Sodium: 143 mEq/L (ref 135–145)
Total Bilirubin: 0.4 mg/dL (ref 0.3–1.2)
Total Protein: 5.9 g/dL — ABNORMAL LOW (ref 6.0–8.3)

## 2009-12-05 LAB — KAPPA/LAMBDA LIGHT CHAINS
Kappa free light chain: <0.06 mg/dL — ABNORMAL LOW (ref 0.33–1.94)
Lambda Free Lght Chn: <0.47 mg/dL — ABNORMAL LOW (ref 0.57–2.63)

## 2009-12-10 LAB — CBC WITH DIFFERENTIAL (CANCER CENTER ONLY)
BASO%: 0.6 % (ref 0.0–2.0)
EOS%: 4.9 % (ref 0.0–7.0)
HCT: 34.3 % — ABNORMAL LOW (ref 34.8–46.6)
LYMPH%: 30.4 % (ref 14.0–48.0)
MCH: 31.8 pg (ref 26.0–34.0)
MCHC: 33.9 g/dL (ref 32.0–36.0)
MCV: 94 fL (ref 81–101)
MONO%: 14.6 % — ABNORMAL HIGH (ref 0.0–13.0)
NEUT%: 49.5 % (ref 39.6–80.0)
RDW: 14.1 % (ref 10.5–14.6)

## 2009-12-17 LAB — CBC WITH DIFFERENTIAL (CANCER CENTER ONLY)
BASO#: 0 10*3/uL (ref 0.0–0.2)
BASO%: 0.5 % (ref 0.0–2.0)
EOS%: 2.7 % (ref 0.0–7.0)
HCT: 34.9 % (ref 34.8–46.6)
HGB: 11.5 g/dL — ABNORMAL LOW (ref 11.6–15.9)
LYMPH#: 1.4 10*3/uL (ref 0.9–3.3)
LYMPH%: 17 % (ref 14.0–48.0)
MCH: 31.5 pg (ref 26.0–34.0)
MCHC: 33.1 g/dL (ref 32.0–36.0)
MCV: 95 fL (ref 81–101)
MONO%: 9.3 % (ref 0.0–13.0)
NEUT%: 70.5 % (ref 39.6–80.0)
RDW: 13.7 % (ref 10.5–14.6)

## 2009-12-24 LAB — CBC WITH DIFFERENTIAL (CANCER CENTER ONLY)
BASO%: 0.6 % (ref 0.0–2.0)
EOS%: 1.7 % (ref 0.0–7.0)
HGB: 11.2 g/dL — ABNORMAL LOW (ref 11.6–15.9)
LYMPH#: 1.6 10*3/uL (ref 0.9–3.3)
MCH: 31.3 pg (ref 26.0–34.0)
MCHC: 32.8 g/dL (ref 32.0–36.0)
MONO%: 11.7 % (ref 0.0–13.0)
NEUT#: 4.8 10*3/uL (ref 1.5–6.5)
Platelets: 157 10*3/uL (ref 145–400)

## 2010-01-06 ENCOUNTER — Ambulatory Visit: Payer: Self-pay | Admitting: Hematology & Oncology

## 2010-01-07 LAB — CBC WITH DIFFERENTIAL (CANCER CENTER ONLY)
BASO#: 0 10*3/uL (ref 0.0–0.2)
Eosinophils Absolute: 0.1 10*3/uL (ref 0.0–0.5)
HGB: 10.6 g/dL — ABNORMAL LOW (ref 11.6–15.9)
LYMPH#: 2.2 10*3/uL (ref 0.9–3.3)
MCH: 32.2 pg (ref 26.0–34.0)
MONO#: 0.7 10*3/uL (ref 0.1–0.9)
MONO%: 12.8 % (ref 0.0–13.0)
NEUT#: 2.5 10*3/uL (ref 1.5–6.5)
Platelets: 264 10*3/uL (ref 145–400)
RBC: 3.3 10*6/uL — ABNORMAL LOW (ref 3.70–5.32)
WBC: 5.5 10*3/uL (ref 3.9–10.0)

## 2010-01-14 LAB — CBC WITH DIFFERENTIAL (CANCER CENTER ONLY)
BASO#: 0 10*3/uL (ref 0.0–0.2)
Eosinophils Absolute: 0.1 10*3/uL (ref 0.0–0.5)
HGB: 11 g/dL — ABNORMAL LOW (ref 11.6–15.9)
LYMPH%: 27.4 % (ref 14.0–48.0)
MCH: 31.8 pg (ref 26.0–34.0)
MCV: 95 fL (ref 81–101)
MONO#: 0.6 10*3/uL (ref 0.1–0.9)
Platelets: 199 10*3/uL (ref 145–400)
RBC: 3.45 10*6/uL — ABNORMAL LOW (ref 3.70–5.32)
WBC: 3.9 10*3/uL (ref 3.9–10.0)

## 2010-01-22 ENCOUNTER — Ambulatory Visit (HOSPITAL_COMMUNITY): Admission: RE | Admit: 2010-01-22 | Discharge: 2010-01-22 | Payer: Self-pay | Admitting: Hematology & Oncology

## 2010-01-23 LAB — CBC WITH DIFFERENTIAL (CANCER CENTER ONLY)
BASO#: 0 10*3/uL (ref 0.0–0.2)
Eosinophils Absolute: 0.1 10*3/uL (ref 0.0–0.5)
HCT: 33.3 % — ABNORMAL LOW (ref 34.8–46.6)
HGB: 10.9 g/dL — ABNORMAL LOW (ref 11.6–15.9)
LYMPH%: 23 % (ref 14.0–48.0)
MCH: 31.3 pg (ref 26.0–34.0)
MCV: 96 fL (ref 81–101)
MONO%: 12.7 % (ref 0.0–13.0)
RBC: 3.47 10*6/uL — ABNORMAL LOW (ref 3.70–5.32)

## 2010-01-27 LAB — PROTEIN ELECTROPHORESIS, SERUM
Albumin ELP: 59.5 % (ref 55.8–66.1)
Beta 2: 4.9 % (ref 3.2–6.5)

## 2010-01-27 LAB — COMPREHENSIVE METABOLIC PANEL
Albumin: 4 g/dL (ref 3.5–5.2)
Alkaline Phosphatase: 58 U/L (ref 39–117)
BUN: 13 mg/dL (ref 6–23)
CO2: 22 mEq/L (ref 19–32)
Glucose, Bld: 105 mg/dL — ABNORMAL HIGH (ref 70–99)
Total Bilirubin: 0.3 mg/dL (ref 0.3–1.2)

## 2010-01-27 LAB — KAPPA/LAMBDA LIGHT CHAINS
Kappa free light chain: 1.84 mg/dL (ref 0.33–1.94)
Kappa:Lambda Ratio: 2.56 — ABNORMAL HIGH (ref 0.26–1.65)
Lambda Free Lght Chn: 0.72 mg/dL (ref 0.57–2.63)

## 2010-01-27 LAB — IGG, IGA, IGM: IgM, Serum: 26 mg/dL — ABNORMAL LOW (ref 60–263)

## 2010-02-24 ENCOUNTER — Ambulatory Visit (HOSPITAL_COMMUNITY): Admission: RE | Admit: 2010-02-24 | Discharge: 2010-02-24 | Payer: Self-pay | Admitting: Hematology & Oncology

## 2010-02-26 ENCOUNTER — Ambulatory Visit: Payer: Self-pay | Admitting: Hematology & Oncology

## 2010-02-27 LAB — COMPREHENSIVE METABOLIC PANEL
AST: 21 U/L (ref 0–37)
Albumin: 4 g/dL (ref 3.5–5.2)
BUN: 12 mg/dL (ref 6–23)
Calcium: 8.8 mg/dL (ref 8.4–10.5)
Chloride: 105 mEq/L (ref 96–112)
Potassium: 4.6 mEq/L (ref 3.5–5.3)

## 2010-02-27 LAB — CBC WITH DIFFERENTIAL (CANCER CENTER ONLY)
BASO#: 0 10*3/uL (ref 0.0–0.2)
EOS%: 3.3 % (ref 0.0–7.0)
Eosinophils Absolute: 0.2 10*3/uL (ref 0.0–0.5)
HGB: 9.9 g/dL — ABNORMAL LOW (ref 11.6–15.9)
LYMPH#: 1.3 10*3/uL (ref 0.9–3.3)
NEUT#: 3 10*3/uL (ref 1.5–6.5)
RBC: 3.08 10*6/uL — ABNORMAL LOW (ref 3.70–5.32)
WBC: 5.1 10*3/uL (ref 3.9–10.0)

## 2010-03-09 LAB — BASIC METABOLIC PANEL - CANCER CENTER ONLY
Chloride: 103 mEq/L (ref 98–108)
Potassium: 4.4 mEq/L (ref 3.3–4.7)
Sodium: 140 mEq/L (ref 128–145)

## 2010-03-09 LAB — CBC WITH DIFFERENTIAL (CANCER CENTER ONLY)
BASO#: 0 10*3/uL (ref 0.0–0.2)
BASO%: 0.3 % (ref 0.0–2.0)
EOS%: 3.5 % (ref 0.0–7.0)
HGB: 10.6 g/dL — ABNORMAL LOW (ref 11.6–15.9)
LYMPH#: 0.8 10*3/uL — ABNORMAL LOW (ref 0.9–3.3)
MCHC: 33.5 g/dL (ref 32.0–36.0)
NEUT#: 3 10*3/uL (ref 1.5–6.5)
RDW: 11.8 % (ref 10.5–14.6)

## 2010-03-23 LAB — CMP (CANCER CENTER ONLY)
ALT(SGPT): 16 U/L (ref 10–47)
Albumin: 3.6 g/dL (ref 3.3–5.5)
BUN, Bld: 20 mg/dL (ref 7–22)
CO2: 28 mEq/L (ref 18–33)
Calcium: 9.5 mg/dL (ref 8.0–10.3)
Chloride: 103 mEq/L (ref 98–108)
Creat: 1.1 mg/dl (ref 0.6–1.2)

## 2010-03-23 LAB — CBC WITH DIFFERENTIAL (CANCER CENTER ONLY)
BASO#: 0 10*3/uL (ref 0.0–0.2)
HCT: 32.7 % — ABNORMAL LOW (ref 34.8–46.6)
HGB: 11.2 g/dL — ABNORMAL LOW (ref 11.6–15.9)
LYMPH#: 0.9 10*3/uL (ref 0.9–3.3)
MONO#: 0.5 10*3/uL (ref 0.1–0.9)
NEUT%: 50.8 % (ref 39.6–80.0)
RBC: 3.39 10*6/uL — ABNORMAL LOW (ref 3.70–5.32)
WBC: 3.2 10*3/uL — ABNORMAL LOW (ref 3.9–10.0)

## 2010-03-24 LAB — KAPPA/LAMBDA LIGHT CHAINS
Kappa free light chain: 2.27 mg/dL — ABNORMAL HIGH (ref 0.33–1.94)
Kappa:Lambda Ratio: 3.49 — ABNORMAL HIGH (ref 0.26–1.65)

## 2010-03-30 ENCOUNTER — Ambulatory Visit: Payer: Self-pay | Admitting: Hematology & Oncology

## 2010-03-30 LAB — CBC WITH DIFFERENTIAL (CANCER CENTER ONLY)
BASO%: 0.8 % (ref 0.0–2.0)
EOS%: 3.4 % (ref 0.0–7.0)
LYMPH%: 26.3 % (ref 14.0–48.0)
MCHC: 33.6 g/dL (ref 32.0–36.0)
MCV: 97 fL (ref 81–101)
MONO#: 0.5 10*3/uL (ref 0.1–0.9)
Platelets: 200 10*3/uL (ref 145–400)
RDW: 11.3 % (ref 10.5–14.6)
WBC: 3.7 10*3/uL — ABNORMAL LOW (ref 3.9–10.0)

## 2010-04-13 LAB — CBC WITH DIFFERENTIAL (CANCER CENTER ONLY)
BASO%: 0.3 % (ref 0.0–2.0)
EOS%: 3.7 % (ref 0.0–7.0)
HCT: 30 % — ABNORMAL LOW (ref 34.8–46.6)
LYMPH#: 0.7 10*3/uL — ABNORMAL LOW (ref 0.9–3.3)
MCHC: 34.5 g/dL (ref 32.0–36.0)
MONO#: 0.5 10*3/uL (ref 0.1–0.9)
NEUT#: 2 10*3/uL (ref 1.5–6.5)
NEUT%: 58.7 % (ref 39.6–80.0)
Platelets: 252 10*3/uL (ref 145–400)
RDW: 11.9 % (ref 10.5–14.6)
WBC: 3.4 10*3/uL — ABNORMAL LOW (ref 3.9–10.0)

## 2010-04-15 LAB — SPEP & IFE WITH QIG
Albumin ELP: 60.8 % (ref 55.8–66.1)
Beta 2: 5.1 % (ref 3.2–6.5)
Beta Globulin: 5.9 % (ref 4.7–7.2)
IgA: 42 mg/dL — ABNORMAL LOW (ref 68–378)
IgG (Immunoglobin G), Serum: 757 mg/dL (ref 694–1618)

## 2010-04-15 LAB — COMPREHENSIVE METABOLIC PANEL
CO2: 22 mEq/L (ref 19–32)
Chloride: 106 mEq/L (ref 96–112)
Potassium: 4.3 mEq/L (ref 3.5–5.3)
Sodium: 143 mEq/L (ref 135–145)

## 2010-04-15 LAB — KAPPA/LAMBDA LIGHT CHAINS
Kappa:Lambda Ratio: 3.8 — ABNORMAL HIGH (ref 0.26–1.65)
Lambda Free Lght Chn: 0.49 mg/dL — ABNORMAL LOW (ref 0.57–2.63)

## 2010-04-22 LAB — CBC WITH DIFFERENTIAL (CANCER CENTER ONLY)
Eosinophils Absolute: 0.2 10*3/uL (ref 0.0–0.5)
LYMPH#: 1 10*3/uL (ref 0.9–3.3)
MCH: 32.2 pg (ref 26.0–34.0)
MONO#: 0.6 10*3/uL (ref 0.1–0.9)
MONO%: 14 % — ABNORMAL HIGH (ref 0.0–13.0)
NEUT#: 2.4 10*3/uL (ref 1.5–6.5)
Platelets: 223 10*3/uL (ref 145–400)
RBC: 3.43 10*6/uL — ABNORMAL LOW (ref 3.70–5.32)
WBC: 4.1 10*3/uL (ref 3.9–10.0)

## 2010-05-07 ENCOUNTER — Ambulatory Visit: Payer: Self-pay | Admitting: Hematology & Oncology

## 2010-05-11 LAB — CBC WITH DIFFERENTIAL (CANCER CENTER ONLY)
BASO#: 0 10*3/uL (ref 0.0–0.2)
Eosinophils Absolute: 0.2 10*3/uL (ref 0.0–0.5)
HGB: 9.8 g/dL — ABNORMAL LOW (ref 11.6–15.9)
LYMPH%: 26.4 % (ref 14.0–48.0)
MCV: 95 fL (ref 81–101)
MONO#: 0.7 10*3/uL (ref 0.1–0.9)
Platelets: 260 10*3/uL (ref 145–400)
RBC: 3.01 10*6/uL — ABNORMAL LOW (ref 3.70–5.32)
WBC: 3.7 10*3/uL — ABNORMAL LOW (ref 3.9–10.0)

## 2010-05-11 LAB — CHCC SATELLITE - SMEAR

## 2010-05-13 LAB — IGG, IGA, IGM
IgA: 40 mg/dL — ABNORMAL LOW (ref 68–378)
IgM, Serum: 20 mg/dL — ABNORMAL LOW (ref 60–263)

## 2010-05-13 LAB — COMPREHENSIVE METABOLIC PANEL
ALT: 10 U/L (ref 0–35)
CO2: 22 mEq/L (ref 19–32)
Calcium: 8.7 mg/dL (ref 8.4–10.5)
Chloride: 106 mEq/L (ref 96–112)
Creatinine, Ser: 1.06 mg/dL (ref 0.40–1.20)
Sodium: 143 mEq/L (ref 135–145)
Total Protein: 6.2 g/dL (ref 6.0–8.3)

## 2010-05-13 LAB — PROTEIN ELECTROPHORESIS, SERUM
Albumin ELP: 59.9 % (ref 55.8–66.1)
Alpha-1-Globulin: 5.3 % — ABNORMAL HIGH (ref 2.9–4.9)
Beta 2: 4.8 % (ref 3.2–6.5)

## 2010-05-13 LAB — KAPPA/LAMBDA LIGHT CHAINS: Kappa free light chain: 2.05 mg/dL — ABNORMAL HIGH (ref 0.33–1.94)

## 2010-05-18 LAB — CBC WITH DIFFERENTIAL (CANCER CENTER ONLY)
BASO%: 0.5 % (ref 0.0–2.0)
EOS%: 5.7 % (ref 0.0–7.0)
HCT: 27.5 % — ABNORMAL LOW (ref 34.8–46.6)
HGB: 9.5 g/dL — ABNORMAL LOW (ref 11.6–15.9)
LYMPH#: 0.7 10*3/uL — ABNORMAL LOW (ref 0.9–3.3)
MCV: 95 fL (ref 81–101)
MONO%: 8.8 % (ref 0.0–13.0)
NEUT#: 2.6 10*3/uL (ref 1.5–6.5)
RBC: 2.9 10*6/uL — ABNORMAL LOW (ref 3.70–5.32)
WBC: 3.8 10*3/uL — ABNORMAL LOW (ref 3.9–10.0)

## 2010-06-19 ENCOUNTER — Ambulatory Visit: Payer: Self-pay | Admitting: Hematology & Oncology

## 2010-06-23 ENCOUNTER — Ambulatory Visit (HOSPITAL_COMMUNITY): Admission: RE | Admit: 2010-06-23 | Discharge: 2010-06-23 | Payer: Self-pay | Admitting: Hematology & Oncology

## 2010-06-30 LAB — CBC WITH DIFFERENTIAL (CANCER CENTER ONLY)
BASO#: 0 10*3/uL (ref 0.0–0.2)
BASO%: 0.3 % (ref 0.0–2.0)
HCT: 28.2 % — ABNORMAL LOW (ref 34.8–46.6)
HGB: 9.6 g/dL — ABNORMAL LOW (ref 11.6–15.9)
LYMPH#: 0.7 10*3/uL — ABNORMAL LOW (ref 0.9–3.3)
MONO#: 0.6 10*3/uL (ref 0.1–0.9)
NEUT%: 68.6 % (ref 39.6–80.0)
RBC: 2.94 10*6/uL — ABNORMAL LOW (ref 3.70–5.32)
WBC: 4.7 10*3/uL (ref 3.9–10.0)

## 2010-06-30 LAB — CMP (CANCER CENTER ONLY)
ALT(SGPT): 16 U/L (ref 10–47)
BUN, Bld: 11 mg/dL (ref 7–22)
CO2: 27 mEq/L (ref 18–33)
Calcium: 9 mg/dL (ref 8.0–10.3)
Chloride: 102 mEq/L (ref 98–108)
Creat: 1 mg/dl (ref 0.6–1.2)

## 2010-07-02 LAB — SPEP & IFE WITH QIG
Albumin ELP: 61 % (ref 55.8–66.1)
Alpha-1-Globulin: 5.1 % — ABNORMAL HIGH (ref 2.9–4.9)
Alpha-2-Globulin: 12.1 % — ABNORMAL HIGH (ref 7.1–11.8)
Gamma Globulin: 11.4 % (ref 11.1–18.8)

## 2010-07-02 LAB — BETA 2 MICROGLOBULIN, SERUM: Beta-2 Microglobulin: 4.97 mg/L — ABNORMAL HIGH (ref 1.01–1.73)

## 2010-07-02 LAB — KAPPA/LAMBDA LIGHT CHAINS: Kappa free light chain: 1.99 mg/dL — ABNORMAL HIGH (ref 0.33–1.94)

## 2010-07-21 ENCOUNTER — Ambulatory Visit: Payer: Self-pay | Admitting: Hematology & Oncology

## 2010-07-22 LAB — CBC WITH DIFFERENTIAL (CANCER CENTER ONLY)
BASO#: 0 10*3/uL (ref 0.0–0.2)
EOS%: 4.1 % (ref 0.0–7.0)
Eosinophils Absolute: 0.2 10*3/uL (ref 0.0–0.5)
LYMPH%: 19.6 % (ref 14.0–48.0)
MCH: 32 pg (ref 26.0–34.0)
MCHC: 33.1 g/dL (ref 32.0–36.0)
MONO#: 0.7 10*3/uL (ref 0.1–0.9)
NEUT%: 61.5 % (ref 39.6–80.0)

## 2010-07-24 LAB — COMPREHENSIVE METABOLIC PANEL
AST: 17 U/L (ref 0–37)
Chloride: 102 mEq/L (ref 96–112)
Potassium: 3.8 mEq/L (ref 3.5–5.3)
Total Protein: 6.3 g/dL (ref 6.0–8.3)

## 2010-07-24 LAB — KAPPA/LAMBDA LIGHT CHAINS
Kappa free light chain: 2.01 mg/dL — ABNORMAL HIGH (ref 0.33–1.94)
Kappa:Lambda Ratio: 4.1 — ABNORMAL HIGH (ref 0.26–1.65)
Lambda Free Lght Chn: 0.49 mg/dL — ABNORMAL LOW (ref 0.57–2.63)

## 2010-07-24 LAB — SPEP & IFE WITH QIG
Albumin ELP: 59.3 % (ref 55.8–66.1)
IgA: 36 mg/dL — ABNORMAL LOW (ref 68–378)
IgG (Immunoglobin G), Serum: 983 mg/dL (ref 694–1618)

## 2010-07-24 LAB — FERRITIN: Ferritin: 725 ng/mL — ABNORMAL HIGH (ref 10–291)

## 2010-08-06 LAB — BASIC METABOLIC PANEL
BUN: 16 mg/dL (ref 6–23)
CO2: 24 mEq/L (ref 19–32)
Calcium: 9.5 mg/dL (ref 8.4–10.5)
Chloride: 104 mEq/L (ref 96–112)
Creatinine, Ser: 1.18 mg/dL (ref 0.40–1.20)

## 2010-08-06 LAB — MANUAL DIFFERENTIAL (CHCC SATELLITE)
ALC: 0.6 10*3/uL (ref 0.6–2.2)
MONO: 12 % (ref 0–13)

## 2010-08-06 LAB — CBC WITH DIFFERENTIAL (CANCER CENTER ONLY)
HCT: 29.1 % — ABNORMAL LOW (ref 34.8–46.6)
MCH: 32.4 pg (ref 26.0–34.0)
MCHC: 33.8 g/dL (ref 32.0–36.0)
Platelets: 263 10*3/uL (ref 145–400)
RBC: 3.03 10*6/uL — ABNORMAL LOW (ref 3.70–5.32)
WBC: 5.1 10*3/uL (ref 3.9–10.0)

## 2010-08-12 ENCOUNTER — Encounter: Admission: RE | Admit: 2010-08-12 | Discharge: 2010-08-12 | Payer: Self-pay | Admitting: General Surgery

## 2010-08-25 ENCOUNTER — Ambulatory Visit: Payer: Self-pay | Admitting: Diagnostic Radiology

## 2010-08-25 ENCOUNTER — Ambulatory Visit (HOSPITAL_BASED_OUTPATIENT_CLINIC_OR_DEPARTMENT_OTHER)
Admission: RE | Admit: 2010-08-25 | Discharge: 2010-08-25 | Payer: Self-pay | Source: Home / Self Care | Admitting: Hematology & Oncology

## 2010-09-01 ENCOUNTER — Ambulatory Visit: Payer: Self-pay | Admitting: Hematology & Oncology

## 2010-09-02 LAB — CBC WITH DIFFERENTIAL (CANCER CENTER ONLY)
BASO%: 0.4 % (ref 0.0–2.0)
HCT: 32.7 % — ABNORMAL LOW (ref 34.8–46.6)
HGB: 11 g/dL — ABNORMAL LOW (ref 11.6–15.9)
LYMPH#: 0.8 10*3/uL — ABNORMAL LOW (ref 0.9–3.3)
MONO#: 0.5 10*3/uL (ref 0.1–0.9)
NEUT%: 61.2 % (ref 39.6–80.0)
RBC: 3.37 10*6/uL — ABNORMAL LOW (ref 3.70–5.32)
RDW: 12.9 % (ref 10.5–14.6)
WBC: 3.6 10*3/uL — ABNORMAL LOW (ref 3.9–10.0)

## 2010-09-02 LAB — CMP (CANCER CENTER ONLY)
ALT(SGPT): 33 U/L (ref 10–47)
CO2: 30 mEq/L (ref 18–33)
Calcium: 9.7 mg/dL (ref 8.0–10.3)
Chloride: 98 mEq/L (ref 98–108)
Creat: 1.4 mg/dl — ABNORMAL HIGH (ref 0.6–1.2)
Total Protein: 6.9 g/dL (ref 6.4–8.1)

## 2010-09-07 LAB — KAPPA/LAMBDA LIGHT CHAINS: Lambda Free Lght Chn: 0.82 mg/dL (ref 0.57–2.63)

## 2010-09-07 LAB — IRON AND TIBC: %SAT: 35 % (ref 20–55)

## 2010-09-07 LAB — SPEP & IFE WITH QIG
Alpha-1-Globulin: 5.1 % — ABNORMAL HIGH (ref 2.9–4.9)
Beta 2: 4.3 % (ref 3.2–6.5)
Beta Globulin: 5.5 % (ref 4.7–7.2)
Gamma Globulin: 13.8 % (ref 11.1–18.8)
IgA: 54 mg/dL — ABNORMAL LOW (ref 68–378)
IgM, Serum: 78 mg/dL (ref 60–263)

## 2010-09-07 LAB — LACTATE DEHYDROGENASE: LDH: 144 U/L (ref 94–250)

## 2010-09-29 LAB — CBC WITH DIFFERENTIAL (CANCER CENTER ONLY)
BASO%: 0.4 % (ref 0.0–2.0)
Eosinophils Absolute: 0.2 10*3/uL (ref 0.0–0.5)
HCT: 32 % — ABNORMAL LOW (ref 34.8–46.6)
LYMPH#: 1 10*3/uL (ref 0.9–3.3)
MONO#: 0.5 10*3/uL (ref 0.1–0.9)
NEUT%: 63.3 % (ref 39.6–80.0)
Platelets: 215 10*3/uL (ref 145–400)
RBC: 3.3 10*6/uL — ABNORMAL LOW (ref 3.70–5.32)
RDW: 12.2 % (ref 10.5–14.6)
WBC: 4.6 10*3/uL (ref 3.9–10.0)

## 2010-09-29 LAB — CMP (CANCER CENTER ONLY)
ALT(SGPT): 34 U/L (ref 10–47)
AST: 33 U/L (ref 11–38)
Albumin: 3.6 g/dL (ref 3.3–5.5)
CO2: 27 mEq/L (ref 18–33)
Calcium: 9.6 mg/dL (ref 8.0–10.3)
Chloride: 102 mEq/L (ref 98–108)
Potassium: 4.4 mEq/L (ref 3.3–4.7)

## 2010-09-29 LAB — CHCC SATELLITE - SMEAR

## 2010-10-02 LAB — SPEP & IFE WITH QIG
Albumin ELP: 58.4 % (ref 55.8–66.1)
Alpha-1-Globulin: 5.4 % — ABNORMAL HIGH (ref 2.9–4.9)
IgM, Serum: 37 mg/dL — ABNORMAL LOW (ref 60–263)
Total Protein, Serum Electrophoresis: 6.6 g/dL (ref 6.0–8.3)

## 2010-10-14 ENCOUNTER — Ambulatory Visit (HOSPITAL_BASED_OUTPATIENT_CLINIC_OR_DEPARTMENT_OTHER)
Admission: RE | Admit: 2010-10-14 | Discharge: 2010-10-14 | Payer: Self-pay | Source: Home / Self Care | Attending: Hematology & Oncology | Admitting: Hematology & Oncology

## 2010-10-26 ENCOUNTER — Ambulatory Visit: Payer: Self-pay | Admitting: Hematology & Oncology

## 2010-10-27 LAB — CMP (CANCER CENTER ONLY)
ALT(SGPT): 37 U/L (ref 10–47)
AST: 34 U/L (ref 11–38)
Albumin: 3.3 g/dL (ref 3.3–5.5)
Alkaline Phosphatase: 52 U/L (ref 26–84)
BUN, Bld: 11 mg/dL (ref 7–22)
CO2: 22 meq/L (ref 18–33)
Calcium: 8.6 mg/dL (ref 8.0–10.3)
Chloride: 104 meq/L (ref 98–108)
Creat: 1.2 mg/dL (ref 0.6–1.2)
Glucose, Bld: 127 mg/dL — ABNORMAL HIGH (ref 73–118)
Potassium: 3.1 meq/L — ABNORMAL LOW (ref 3.3–4.7)
Sodium: 143 meq/L (ref 128–145)
Total Bilirubin: 0.7 mg/dL (ref 0.20–1.60)
Total Protein: 6.8 g/dL (ref 6.4–8.1)

## 2010-10-27 LAB — CBC WITH DIFFERENTIAL (CANCER CENTER ONLY)
BASO#: 0 10*3/uL (ref 0.0–0.2)
BASO%: 0.3 % (ref 0.0–2.0)
EOS%: 2.6 % (ref 0.0–7.0)
Eosinophils Absolute: 0.1 10*3/uL (ref 0.0–0.5)
HCT: 30.9 % — ABNORMAL LOW (ref 34.8–46.6)
HGB: 10.4 g/dL — ABNORMAL LOW (ref 11.6–15.9)
LYMPH#: 1.2 10*3/uL (ref 0.9–3.3)
LYMPH%: 30.8 % (ref 14.0–48.0)
MCH: 32 pg (ref 26.0–34.0)
MCHC: 33.6 g/dL (ref 32.0–36.0)
MCV: 95 fL (ref 81–101)
MONO#: 0.5 10*3/uL (ref 0.1–0.9)
MONO%: 11.7 % (ref 0.0–13.0)
NEUT#: 2.2 10*3/uL (ref 1.5–6.5)
NEUT%: 54.6 % (ref 39.6–80.0)
Platelets: 165 10*3/uL (ref 145–400)
RBC: 3.24 10*6/uL — ABNORMAL LOW (ref 3.70–5.32)
RDW: 12.3 % (ref 10.5–14.6)
WBC: 4 10*3/uL (ref 3.9–10.0)

## 2010-11-02 LAB — FERRITIN: Ferritin: 529 ng/mL — ABNORMAL HIGH (ref 10–291)

## 2010-11-02 LAB — SPEP & IFE WITH QIG
Albumin ELP: 59 % (ref 55.8–66.1)
Alpha-1-Globulin: 5.2 % — ABNORMAL HIGH (ref 2.9–4.9)
Alpha-2-Globulin: 11.5 % (ref 7.1–11.8)
Beta 2: 5.3 % (ref 3.2–6.5)
Beta Globulin: 6.4 % (ref 4.7–7.2)
Gamma Globulin: 12.6 % (ref 11.1–18.8)
IgA: 108 mg/dL (ref 68–378)
IgG (Immunoglobin G), Serum: 921 mg/dL (ref 694–1618)
IgM, Serum: 26 mg/dL — ABNORMAL LOW (ref 60–263)
Total Protein, Serum Electrophoresis: 6.7 g/dL (ref 6.0–8.3)

## 2010-11-02 LAB — KAPPA/LAMBDA LIGHT CHAINS
Kappa free light chain: 3.06 mg/dL — ABNORMAL HIGH (ref 0.33–1.94)
Kappa:Lambda Ratio: 2.76 — ABNORMAL HIGH (ref 0.26–1.65)
Lambda Free Lght Chn: 1.11 mg/dL (ref 0.57–2.63)

## 2010-11-08 ENCOUNTER — Encounter: Payer: Self-pay | Admitting: Hematology & Oncology

## 2010-12-14 ENCOUNTER — Other Ambulatory Visit: Payer: Self-pay | Admitting: Hematology & Oncology

## 2010-12-14 ENCOUNTER — Encounter (HOSPITAL_BASED_OUTPATIENT_CLINIC_OR_DEPARTMENT_OTHER): Payer: Medicare Other | Admitting: Hematology & Oncology

## 2010-12-14 DIAGNOSIS — N289 Disorder of kidney and ureter, unspecified: Secondary | ICD-10-CM

## 2010-12-14 DIAGNOSIS — D801 Nonfamilial hypogammaglobulinemia: Secondary | ICD-10-CM

## 2010-12-14 DIAGNOSIS — C9000 Multiple myeloma not having achieved remission: Secondary | ICD-10-CM

## 2010-12-14 DIAGNOSIS — D649 Anemia, unspecified: Secondary | ICD-10-CM

## 2010-12-14 LAB — CBC WITH DIFFERENTIAL (CANCER CENTER ONLY)
BASO#: 0 10*3/uL (ref 0.0–0.2)
EOS%: 2.2 % (ref 0.0–7.0)
Eosinophils Absolute: 0.2 10*3/uL (ref 0.0–0.5)
HCT: 34.8 % (ref 34.8–46.6)
HGB: 11.8 g/dL (ref 11.6–15.9)
LYMPH#: 1 10*3/uL (ref 0.9–3.3)
MCHC: 34 g/dL (ref 32.0–36.0)
NEUT%: 73.9 % (ref 39.6–80.0)
RBC: 3.63 10*6/uL — ABNORMAL LOW (ref 3.70–5.32)

## 2010-12-14 LAB — COMPREHENSIVE METABOLIC PANEL
Albumin: 4.1 g/dL (ref 3.5–5.2)
BUN: 25 mg/dL — ABNORMAL HIGH (ref 6–23)
CO2: 26 mEq/L (ref 19–32)
Calcium: 9.9 mg/dL (ref 8.4–10.5)
Chloride: 102 mEq/L (ref 96–112)
Creatinine, Ser: 1.23 mg/dL — ABNORMAL HIGH (ref 0.40–1.20)
Glucose, Bld: 89 mg/dL (ref 70–99)
Potassium: 4.7 mEq/L (ref 3.5–5.3)

## 2010-12-31 LAB — CBC
HCT: 26.7 % — ABNORMAL LOW (ref 36.0–46.0)
Hemoglobin: 9.1 g/dL — ABNORMAL LOW (ref 12.0–15.0)
RBC: 2.76 MIL/uL — ABNORMAL LOW (ref 3.87–5.11)
WBC: 5.1 10*3/uL (ref 4.0–10.5)

## 2010-12-31 LAB — DIFFERENTIAL
Basophils Relative: 0 % (ref 0–1)
Lymphocytes Relative: 13 % (ref 12–46)
Monocytes Absolute: 0.7 10*3/uL (ref 0.1–1.0)
Monocytes Relative: 14 % — ABNORMAL HIGH (ref 3–12)
Neutro Abs: 3.5 10*3/uL (ref 1.7–7.7)
Neutrophils Relative %: 69 % (ref 43–77)

## 2011-01-03 LAB — URINE CULTURE: Special Requests: NEGATIVE

## 2011-01-03 LAB — CBC
HCT: 29.3 % — ABNORMAL LOW (ref 36.0–46.0)
MCHC: 32.8 g/dL (ref 30.0–36.0)
MCHC: 33 g/dL (ref 30.0–36.0)
MCV: 93.3 fL (ref 78.0–100.0)
MCV: 93.8 fL (ref 78.0–100.0)
MCV: 94.1 fL (ref 78.0–100.0)
Platelets: 103 10*3/uL — ABNORMAL LOW (ref 150–400)
Platelets: 136 10*3/uL — ABNORMAL LOW (ref 150–400)
RBC: 3.12 MIL/uL — ABNORMAL LOW (ref 3.87–5.11)
RBC: 3.16 MIL/uL — ABNORMAL LOW (ref 3.87–5.11)
WBC: 2.6 10*3/uL — ABNORMAL LOW (ref 4.0–10.5)
WBC: 2.9 10*3/uL — ABNORMAL LOW (ref 4.0–10.5)
WBC: 3.4 10*3/uL — ABNORMAL LOW (ref 4.0–10.5)
WBC: 4 10*3/uL (ref 4.0–10.5)

## 2011-01-03 LAB — BASIC METABOLIC PANEL
BUN: 12 mg/dL (ref 6–23)
BUN: 13 mg/dL (ref 6–23)
Calcium: 7 mg/dL — ABNORMAL LOW (ref 8.4–10.5)
Calcium: 7.7 mg/dL — ABNORMAL LOW (ref 8.4–10.5)
Chloride: 109 mEq/L (ref 96–112)
Chloride: 114 mEq/L — ABNORMAL HIGH (ref 96–112)
Creatinine, Ser: 0.77 mg/dL (ref 0.4–1.2)
Creatinine, Ser: 1.11 mg/dL (ref 0.4–1.2)
Creatinine, Ser: 1.13 mg/dL (ref 0.4–1.2)
GFR calc Af Amer: >60 mL/min (ref >60)
GFR calc non Af Amer: 49 mL/min — ABNORMAL LOW (ref >60)

## 2011-01-03 LAB — CROSSMATCH
ABO/RH(D): A NEG
Antibody Screen: NEGATIVE

## 2011-01-03 LAB — TSH: TSH: 0.261 u[IU]/mL — ABNORMAL LOW (ref 0.350–4.500)

## 2011-01-03 LAB — ABO/RH: ABO/RH(D): A NEG

## 2011-01-05 LAB — CBC
HCT: 27.2 % — ABNORMAL LOW (ref 36.0–46.0)
Hemoglobin: 8.9 g/dL — ABNORMAL LOW (ref 12.0–15.0)
MCV: 97.1 fL (ref 78.0–100.0)
RBC: 2.8 MIL/uL — ABNORMAL LOW (ref 3.87–5.11)
WBC: 4.1 10*3/uL (ref 4.0–10.5)

## 2011-01-05 LAB — CHROMOSOME ANALYSIS, BONE MARROW

## 2011-01-05 LAB — BONE MARROW EXAM

## 2011-01-11 ENCOUNTER — Other Ambulatory Visit: Payer: Self-pay | Admitting: Hematology & Oncology

## 2011-01-11 ENCOUNTER — Ambulatory Visit (HOSPITAL_COMMUNITY): Payer: Medicare Other | Attending: Hematology & Oncology

## 2011-01-11 DIAGNOSIS — D649 Anemia, unspecified: Secondary | ICD-10-CM | POA: Insufficient documentation

## 2011-01-11 DIAGNOSIS — D696 Thrombocytopenia, unspecified: Secondary | ICD-10-CM | POA: Insufficient documentation

## 2011-01-11 LAB — CBC
Hemoglobin: 9.4 g/dL — ABNORMAL LOW (ref 12.0–15.0)
MCHC: 32.2 g/dL (ref 30.0–36.0)
Platelets: 80 10*3/uL — ABNORMAL LOW (ref 150–400)
RBC: 2.93 MIL/uL — ABNORMAL LOW (ref 3.87–5.11)

## 2011-01-11 LAB — DIFFERENTIAL
Basophils Absolute: 0 10*3/uL (ref 0.0–0.1)
Basophils Relative: 0 % (ref 0–1)
Basophils Relative: 0 % (ref 0–1)
Monocytes Relative: 10 % (ref 3–12)
Neutro Abs: 4.4 10*3/uL (ref 1.7–7.7)
Neutro Abs: 4.4 10*3/uL (ref 1.7–7.7)
Neutrophils Relative %: 77 % (ref 43–77)
Neutrophils Relative %: 77 % (ref 43–77)

## 2011-01-14 NOTE — Op Note (Signed)
  [name], [name]             ACCOUNT NO.:  [id number]  MEDICAL RECORD NO.:  [id number]           PATIENT TYPE:  O  LOCATION:  MDC                          FACILITY:  [facility]  PHYSICIAN:  Rose Phi. Myna Hidalgo, M.D. DATE OF BIRTH:  12-04-1945  DATE OF PROCEDURE:  01/11/2011 DATE OF DISCHARGE:                              OPERATIVE REPORT   NATURE OF PROCEDURE:  Left posterior iliac crest bone marrow biopsy and aspirate.  [name] was brought to short stay unit.  She had a Port-A-Cath already placed.  The Port-A-Cath was accessed for IV fluid and sedation.  Her Mallampati score was 1.  Her ASA class was 1.  We placed her on to her right side.  We did the appropriate time-out procedure.  She then received a total of 50 mg of Demerol IV and Versed 6.5 mg IV for sedation.  The left posterior iliac crest region was prepped and draped in sterile fashion.  A 10 cc of 2% lidocaine was infiltrated under the skin down to the periosteum.  A #11 scalpel was used to make an incision into the skin.  Despite multiple attempts with a bone marrow aspiration needle, we could not aspirate any bone marrow.  I then used the Jamshidi biopsy needle.  We did obtain an aspirate with the Jamshidi biopsy needle.  We also obtained an excellent biopsy core.  The patient tolerated the procedure well.  There were no complications.     Rose Phi. Myna Hidalgo, M.D.     PRE/MEDQ  D:  01/11/2011  T:  01/11/2011  Job:  [id number]  Electronically Signed by Arlan Organ  on 01/14/2011 07:48:49 AM

## 2011-01-21 LAB — URINALYSIS, MICROSCOPIC ONLY
Hgb urine dipstick: NEGATIVE
Leukocytes, UA: NEGATIVE
Nitrite: NEGATIVE
Specific Gravity, Urine: 1.013 (ref 1.005–1.030)
Urobilinogen, UA: 0.2 mg/dL (ref 0.0–1.0)

## 2011-01-21 LAB — URINALYSIS, ROUTINE W REFLEX MICROSCOPIC
Bilirubin Urine: NEGATIVE
Glucose, UA: NEGATIVE mg/dL
Ketones, ur: NEGATIVE mg/dL
Nitrite: POSITIVE — AB
Protein, ur: NEGATIVE mg/dL
Specific Gravity, Urine: 1.007 (ref 1.005–1.030)
Urobilinogen, UA: 0.2 mg/dL (ref 0.0–1.0)
pH: 6 (ref 5.0–8.0)

## 2011-01-21 LAB — ABO/RH: ABO/RH(D): A NEG

## 2011-01-21 LAB — CBC
HCT: 24 % — ABNORMAL LOW (ref 36.0–46.0)
HCT: 24.2 % — ABNORMAL LOW (ref 36.0–46.0)
Hemoglobin: 8 g/dL — ABNORMAL LOW (ref 12.0–15.0)
Hemoglobin: 8.1 g/dL — ABNORMAL LOW (ref 12.0–15.0)
MCHC: 33.1 g/dL (ref 30.0–36.0)
MCHC: 33.5 g/dL (ref 30.0–36.0)
MCV: 88.1 fL (ref 78.0–100.0)
MCV: 88.3 fL (ref 78.0–100.0)
MCV: 88.7 fL (ref 78.0–100.0)
Platelets: 228 10*3/uL (ref 150–400)
Platelets: 270 10*3/uL (ref 150–400)
RBC: 4.01 MIL/uL (ref 3.87–5.11)
RBC: 2.71 MIL/uL — ABNORMAL LOW (ref 3.87–5.11)
RBC: 2.74 MIL/uL — ABNORMAL LOW (ref 3.87–5.11)
RDW: 16.2 % — ABNORMAL HIGH (ref 11.5–15.5)
RDW: 16.2 % — ABNORMAL HIGH (ref 11.5–15.5)
WBC: 7.6 10*3/uL (ref 4.0–10.5)
WBC: 7.3 10*3/uL (ref 4.0–10.5)
WBC: 7.9 10*3/uL (ref 4.0–10.5)

## 2011-01-21 LAB — DIFFERENTIAL
Basophils Absolute: 0 10*3/uL (ref 0.0–0.1)
Basophils Relative: 0 % (ref 0–1)
Eosinophils Absolute: 0.2 10*3/uL (ref 0.0–0.7)
Eosinophils Relative: 3 % (ref 0–5)
Lymphocytes Relative: 21 % (ref 12–46)
Lymphs Abs: 1.6 10*3/uL (ref 0.7–4.0)
Monocytes Absolute: 0.9 10*3/uL (ref 0.1–1.0)
Monocytes Relative: 12 % (ref 3–12)
Neutro Abs: 4.6 10*3/uL (ref 1.7–7.7)
Neutrophils Relative %: 63 % (ref 43–77)

## 2011-01-21 LAB — URINE CULTURE: Colony Count: >=100000

## 2011-01-21 LAB — CROSSMATCH
ABO/RH(D): A NEG
Antibody Screen: NEGATIVE

## 2011-01-21 LAB — COMPREHENSIVE METABOLIC PANEL
AST: 41 U/L — ABNORMAL HIGH (ref 0–37)
Albumin: 2.3 g/dL — ABNORMAL LOW (ref 3.5–5.2)
Alkaline Phosphatase: 76 U/L (ref 39–117)
Chloride: 103 mEq/L (ref 96–112)
GFR calc Af Amer: 58 mL/min — ABNORMAL LOW (ref >60)
Potassium: 3.6 mEq/L (ref 3.5–5.1)
Sodium: 139 mEq/L (ref 135–145)
Total Bilirubin: 0.5 mg/dL (ref 0.3–1.2)

## 2011-01-21 LAB — URINE MICROSCOPIC-ADD ON

## 2011-01-21 LAB — HEMOCCULT GUIAC POC 1CARD (OFFICE)
Fecal Occult Bld: NEGATIVE
Fecal Occult Bld: NEGATIVE

## 2011-01-22 LAB — PROTIME-INR
INR: 0.9 (ref 0.00–1.49)
Prothrombin Time: 11.8 seconds (ref 11.6–15.2)

## 2011-01-22 LAB — CBC
Hemoglobin: 9.4 g/dL — ABNORMAL LOW (ref 12.0–15.0)
MCHC: 32.8 g/dL (ref 30.0–36.0)
Platelets: 279 10*3/uL (ref 150–400)
RDW: 15.2 % (ref 11.5–15.5)

## 2011-01-22 LAB — APTT: aPTT: 25 seconds (ref 24–37)

## 2011-02-08 ENCOUNTER — Other Ambulatory Visit: Payer: Self-pay | Admitting: Hematology & Oncology

## 2011-02-08 ENCOUNTER — Encounter (HOSPITAL_BASED_OUTPATIENT_CLINIC_OR_DEPARTMENT_OTHER): Payer: Medicare Other | Admitting: Hematology & Oncology

## 2011-02-08 DIAGNOSIS — E559 Vitamin D deficiency, unspecified: Secondary | ICD-10-CM

## 2011-02-08 DIAGNOSIS — R5381 Other malaise: Secondary | ICD-10-CM

## 2011-02-08 DIAGNOSIS — C9000 Multiple myeloma not having achieved remission: Secondary | ICD-10-CM

## 2011-02-08 DIAGNOSIS — D801 Nonfamilial hypogammaglobulinemia: Secondary | ICD-10-CM

## 2011-02-08 DIAGNOSIS — D630 Anemia in neoplastic disease: Secondary | ICD-10-CM

## 2011-02-08 DIAGNOSIS — N289 Disorder of kidney and ureter, unspecified: Secondary | ICD-10-CM

## 2011-02-08 LAB — CMP (CANCER CENTER ONLY)
ALT(SGPT): 101 U/L — ABNORMAL HIGH (ref 10–47)
Alkaline Phosphatase: 63 U/L (ref 26–84)
CO2: 24 mEq/L (ref 18–33)
Creat: 1.3 mg/dl — ABNORMAL HIGH (ref 0.6–1.2)
Sodium: 132 mEq/L (ref 128–145)
Total Bilirubin: 0.6 mg/dl (ref 0.20–1.60)
Total Protein: 6.4 g/dL (ref 6.4–8.1)

## 2011-02-08 LAB — CBC WITH DIFFERENTIAL (CANCER CENTER ONLY)
BASO%: 0.2 % (ref 0.0–2.0)
HCT: 28.8 % — ABNORMAL LOW (ref 34.8–46.6)
LYMPH%: 12 % — ABNORMAL LOW (ref 14.0–48.0)
MCH: 33 pg (ref 26.0–34.0)
MCHC: 33.3 g/dL (ref 32.0–36.0)
MCV: 99 fL (ref 81–101)
MONO%: 11.1 % (ref 0.0–13.0)
NEUT%: 74.7 % (ref 39.6–80.0)
Platelets: 96 10*3/uL — ABNORMAL LOW (ref 145–400)
RDW: 19.5 % — ABNORMAL HIGH (ref 11.1–15.7)

## 2011-02-08 LAB — TECHNOLOGIST REVIEW CHCC SATELLITE

## 2011-02-09 LAB — VITAMIN D 25 HYDROXY (VIT D DEFICIENCY, FRACTURES): Vit D, 25-Hydroxy: 11 ng/mL — ABNORMAL LOW (ref 30–89)

## 2011-02-09 LAB — MAGNESIUM: Magnesium: 2.2 mg/dL (ref 1.5–2.5)

## 2011-03-09 ENCOUNTER — Other Ambulatory Visit: Payer: Self-pay | Admitting: Hematology & Oncology

## 2011-03-09 ENCOUNTER — Ambulatory Visit (HOSPITAL_COMMUNITY): Payer: Medicare Other | Attending: Hematology & Oncology

## 2011-03-09 DIAGNOSIS — C9000 Multiple myeloma not having achieved remission: Secondary | ICD-10-CM | POA: Insufficient documentation

## 2011-03-09 LAB — DIFFERENTIAL
Lymphocytes Relative: 18 % (ref 12–46)
Monocytes Absolute: 0.7 10*3/uL (ref 0.1–1.0)
Monocytes Relative: 11 % (ref 3–12)
Neutro Abs: 4.2 10*3/uL (ref 1.7–7.7)

## 2011-03-09 LAB — CBC
HCT: 32.9 % — ABNORMAL LOW (ref 36.0–46.0)
MCV: 100.9 fL — ABNORMAL HIGH (ref 78.0–100.0)
Platelets: 98 10*3/uL — ABNORMAL LOW (ref 150–400)
RBC: 3.26 MIL/uL — ABNORMAL LOW (ref 3.87–5.11)
RDW: 17.8 % — ABNORMAL HIGH (ref 11.5–15.5)
WBC: 6.1 10*3/uL (ref 4.0–10.5)

## 2011-03-10 NOTE — Op Note (Signed)
  [name], [name] NO.:  [id number]  MEDICAL RECORD NO.:  [id number]           PATIENT TYPE:  O  LOCATION:  MDC                          FACILITY[facility]  [facility]  PHYSICIAN:  Josph Macho, M.D.  DATE OF BIRTH:  23-Aug-1946  DATE OF PROCEDURE:  03/09/2011 DATE OF DISCHARGE:                              OPERATIVE REPORT   PROCEDURE:  Left posterior iliac crest bone marrow biopsy and aspirate.  [name] was brought to short-stay unit.  She had a Port-A-Cath accessed without difficulty.  Her Mallampati score was 1.  ASA class was II.  We did the appropriate time-out procedure with her.  She was then placed on to her right side.  The left posterior iliac crest region was prepped and draped in sterile fashion.  She received a total of 4.5 mg of Versed IV and 37.5 mg of Demerol IV for sedation.  10 mL of 2% lidocaine was infiltrated into the skin down to the periosteum.  #11 scalpel was used make an incision into the skin.  We obtained 2 bone marrow aspirates without difficulty.  The Jamshidi biopsy needle was then used to obtain an excellent bone marrow biopsy core.  The patient tolerated the procedure well.  There were no complications.     Josph Macho, M.D.     PRE/MEDQ  D:  03/09/2011  T:  03/09/2011  Job:  [id number]  Electronically Signed by Arlan Organ  on 03/10/2011 06:54:44 PM

## 2011-03-19 ENCOUNTER — Other Ambulatory Visit: Payer: Self-pay | Admitting: Family

## 2011-03-19 ENCOUNTER — Encounter (HOSPITAL_BASED_OUTPATIENT_CLINIC_OR_DEPARTMENT_OTHER): Payer: Medicare Other | Admitting: Hematology & Oncology

## 2011-03-19 ENCOUNTER — Ambulatory Visit (HOSPITAL_BASED_OUTPATIENT_CLINIC_OR_DEPARTMENT_OTHER)
Admission: RE | Admit: 2011-03-19 | Discharge: 2011-03-19 | Disposition: A | Discharge status: Home / Self Care | Payer: Medicare Other | Source: Ambulatory Visit | Attending: Family | Admitting: Family

## 2011-03-19 DIAGNOSIS — N289 Disorder of kidney and ureter, unspecified: Secondary | ICD-10-CM

## 2011-03-19 DIAGNOSIS — C9000 Multiple myeloma not having achieved remission: Secondary | ICD-10-CM | POA: Insufficient documentation

## 2011-03-19 DIAGNOSIS — R52 Pain, unspecified: Secondary | ICD-10-CM

## 2011-03-19 DIAGNOSIS — D801 Nonfamilial hypogammaglobulinemia: Secondary | ICD-10-CM

## 2011-03-19 DIAGNOSIS — R109 Unspecified abdominal pain: Secondary | ICD-10-CM | POA: Insufficient documentation

## 2011-03-19 DIAGNOSIS — D630 Anemia in neoplastic disease: Secondary | ICD-10-CM

## 2011-04-05 ENCOUNTER — Other Ambulatory Visit: Payer: Self-pay | Admitting: Family

## 2011-04-05 ENCOUNTER — Encounter (HOSPITAL_BASED_OUTPATIENT_CLINIC_OR_DEPARTMENT_OTHER): Payer: Medicare Other | Admitting: Hematology & Oncology

## 2011-04-05 DIAGNOSIS — N289 Disorder of kidney and ureter, unspecified: Secondary | ICD-10-CM

## 2011-04-05 DIAGNOSIS — D801 Nonfamilial hypogammaglobulinemia: Secondary | ICD-10-CM

## 2011-04-05 DIAGNOSIS — D649 Anemia, unspecified: Secondary | ICD-10-CM

## 2011-04-05 DIAGNOSIS — C9000 Multiple myeloma not having achieved remission: Secondary | ICD-10-CM

## 2011-04-05 LAB — COMPREHENSIVE METABOLIC PANEL
ALT: 11 U/L (ref 0–35)
AST: 15 U/L (ref 0–37)
Albumin: 3.7 g/dL (ref 3.5–5.2)
Calcium: 9 mg/dL (ref 8.4–10.5)
Chloride: 103 mEq/L (ref 96–112)
Creatinine, Ser: 1.1 mg/dL (ref 0.50–1.10)
Potassium: 4.4 mEq/L (ref 3.5–5.3)

## 2011-04-05 LAB — CBC WITH DIFFERENTIAL (CANCER CENTER ONLY)
BASO%: 0.3 % (ref 0.0–2.0)
EOS%: 2.1 % (ref 0.0–7.0)
LYMPH#: 1 10*3/uL (ref 0.9–3.3)
MCHC: 34.3 g/dL (ref 32.0–36.0)
NEUT#: 4.5 10*3/uL (ref 1.5–6.5)
Platelets: 115 10*3/uL — ABNORMAL LOW (ref 145–400)
RDW: 17.4 % — ABNORMAL HIGH (ref 11.1–15.7)

## 2011-05-19 ENCOUNTER — Ambulatory Visit: Payer: Medicare Other | Admitting: Physical Therapy

## 2011-06-07 ENCOUNTER — Encounter (HOSPITAL_BASED_OUTPATIENT_CLINIC_OR_DEPARTMENT_OTHER): Payer: Medicare Other | Admitting: Hematology & Oncology

## 2011-06-07 ENCOUNTER — Other Ambulatory Visit: Payer: Self-pay | Admitting: Hematology & Oncology

## 2011-06-07 DIAGNOSIS — C9000 Multiple myeloma not having achieved remission: Secondary | ICD-10-CM

## 2011-06-07 DIAGNOSIS — N289 Disorder of kidney and ureter, unspecified: Secondary | ICD-10-CM

## 2011-06-07 DIAGNOSIS — D649 Anemia, unspecified: Secondary | ICD-10-CM

## 2011-06-07 DIAGNOSIS — Z5111 Encounter for antineoplastic chemotherapy: Secondary | ICD-10-CM

## 2011-06-07 DIAGNOSIS — D638 Anemia in other chronic diseases classified elsewhere: Secondary | ICD-10-CM

## 2011-06-07 DIAGNOSIS — D801 Nonfamilial hypogammaglobulinemia: Secondary | ICD-10-CM

## 2011-06-07 LAB — CBC WITH DIFFERENTIAL (CANCER CENTER ONLY)
BASO%: 0.3 % (ref 0.0–2.0)
EOS%: 2.3 % (ref 0.0–7.0)
HCT: 32.2 % — ABNORMAL LOW (ref 34.8–46.6)
LYMPH#: 1.1 10*3/uL (ref 0.9–3.3)
MCHC: 34.8 g/dL (ref 32.0–36.0)
NEUT%: 70.6 % (ref 39.6–80.0)
RDW: 19.4 % — ABNORMAL HIGH (ref 11.1–15.7)

## 2011-06-07 LAB — CMP (CANCER CENTER ONLY)
AST: 22 U/L (ref 11–38)
Albumin: 3.3 g/dL (ref 3.3–5.5)
Alkaline Phosphatase: 52 U/L (ref 26–84)
BUN, Bld: 16 mg/dL (ref 7–22)
Calcium: 9.9 mg/dL (ref 8.0–10.3)
Chloride: 99 mEq/L (ref 98–108)
Glucose, Bld: 117 mg/dL (ref 73–118)
Potassium: 4.4 mEq/L (ref 3.3–4.7)
Sodium: 140 mEq/L (ref 128–145)
Total Protein: 6.3 g/dL — ABNORMAL LOW (ref 6.4–8.1)

## 2011-06-08 ENCOUNTER — Encounter (HOSPITAL_BASED_OUTPATIENT_CLINIC_OR_DEPARTMENT_OTHER): Payer: Medicare Other | Admitting: Hematology & Oncology

## 2011-06-08 DIAGNOSIS — C9000 Multiple myeloma not having achieved remission: Secondary | ICD-10-CM

## 2011-06-08 DIAGNOSIS — D649 Anemia, unspecified: Secondary | ICD-10-CM

## 2011-06-08 DIAGNOSIS — Z5111 Encounter for antineoplastic chemotherapy: Secondary | ICD-10-CM

## 2011-06-08 DIAGNOSIS — D801 Nonfamilial hypogammaglobulinemia: Secondary | ICD-10-CM

## 2011-06-09 LAB — SPEP & IFE WITH QIG
Albumin ELP: 66.6 % — ABNORMAL HIGH (ref 55.8–66.1)
Alpha-1-Globulin: 5.4 % — ABNORMAL HIGH (ref 2.9–4.9)
Alpha-2-Globulin: 12.5 % — ABNORMAL HIGH (ref 7.1–11.8)
Gamma Globulin: 4.1 % — ABNORMAL LOW (ref 11.1–18.8)
IgM, Serum: 27 mg/dL — ABNORMAL LOW (ref 52–322)
Total Protein, Serum Electrophoresis: 5.8 g/dL — ABNORMAL LOW (ref 6.0–8.3)

## 2011-06-09 LAB — LACTATE DEHYDROGENASE: LDH: 200 U/L (ref 94–250)

## 2011-06-09 LAB — KAPPA/LAMBDA LIGHT CHAINS: Kappa:Lambda Ratio: 7.21 — ABNORMAL HIGH (ref 0.26–1.65)

## 2011-06-14 ENCOUNTER — Other Ambulatory Visit: Payer: Self-pay | Admitting: Hematology & Oncology

## 2011-06-14 ENCOUNTER — Encounter (HOSPITAL_BASED_OUTPATIENT_CLINIC_OR_DEPARTMENT_OTHER): Payer: Medicare Other | Admitting: Hematology & Oncology

## 2011-06-14 DIAGNOSIS — E559 Vitamin D deficiency, unspecified: Secondary | ICD-10-CM

## 2011-06-14 DIAGNOSIS — D649 Anemia, unspecified: Secondary | ICD-10-CM

## 2011-06-14 DIAGNOSIS — D801 Nonfamilial hypogammaglobulinemia: Secondary | ICD-10-CM

## 2011-06-14 DIAGNOSIS — C9000 Multiple myeloma not having achieved remission: Secondary | ICD-10-CM

## 2011-06-14 DIAGNOSIS — N289 Disorder of kidney and ureter, unspecified: Secondary | ICD-10-CM

## 2011-06-14 DIAGNOSIS — Z5111 Encounter for antineoplastic chemotherapy: Secondary | ICD-10-CM

## 2011-06-14 LAB — CBC WITH DIFFERENTIAL (CANCER CENTER ONLY)
BASO%: 0.2 % (ref 0.0–2.0)
EOS%: 2.2 % (ref 0.0–7.0)
HCT: 29.9 % — ABNORMAL LOW (ref 34.8–46.6)
LYMPH#: 0.8 10*3/uL — ABNORMAL LOW (ref 0.9–3.3)
MCHC: 34.1 g/dL (ref 32.0–36.0)
NEUT%: 73 % (ref 39.6–80.0)
Platelets: 130 10*3/uL — ABNORMAL LOW (ref 145–400)
RDW: 19.8 % — ABNORMAL HIGH (ref 11.1–15.7)
WBC: 5.4 10*3/uL (ref 3.9–10.0)

## 2011-06-14 LAB — TECHNOLOGIST REVIEW CHCC SATELLITE

## 2011-06-14 LAB — COMPREHENSIVE METABOLIC PANEL
ALT: 16 U/L (ref 0–35)
Alkaline Phosphatase: 47 U/L (ref 39–117)
Creatinine, Ser: 1.04 mg/dL (ref 0.50–1.10)
Sodium: 145 mEq/L (ref 135–145)
Total Bilirubin: 0.6 mg/dL (ref 0.3–1.2)
Total Protein: 5.6 g/dL — ABNORMAL LOW (ref 6.0–8.3)

## 2011-06-15 ENCOUNTER — Emergency Department (INDEPENDENT_AMBULATORY_CARE_PROVIDER_SITE_OTHER): Payer: Medicare Other

## 2011-06-15 ENCOUNTER — Encounter: Payer: Self-pay | Admitting: *Deleted

## 2011-06-15 ENCOUNTER — Emergency Department (HOSPITAL_BASED_OUTPATIENT_CLINIC_OR_DEPARTMENT_OTHER)
Admission: EM | Admit: 2011-06-15 | Discharge: 2011-06-15 | Disposition: A | Discharge status: Home / Self Care | Payer: Medicare Other | Attending: Emergency Medicine | Admitting: Emergency Medicine

## 2011-06-15 DIAGNOSIS — C9000 Multiple myeloma not having achieved remission: Secondary | ICD-10-CM | POA: Insufficient documentation

## 2011-06-15 DIAGNOSIS — G319 Degenerative disease of nervous system, unspecified: Secondary | ICD-10-CM

## 2011-06-15 DIAGNOSIS — R0902 Hypoxemia: Secondary | ICD-10-CM

## 2011-06-15 DIAGNOSIS — I517 Cardiomegaly: Secondary | ICD-10-CM

## 2011-06-15 DIAGNOSIS — R0602 Shortness of breath: Secondary | ICD-10-CM | POA: Insufficient documentation

## 2011-06-15 DIAGNOSIS — R4182 Altered mental status, unspecified: Secondary | ICD-10-CM

## 2011-06-15 HISTORY — DX: Other chronic pain: G89.29

## 2011-06-15 HISTORY — DX: Major depressive disorder, single episode, unspecified: F32.9

## 2011-06-15 HISTORY — DX: Overactive bladder: N32.81

## 2011-06-15 HISTORY — DX: Depression, unspecified: F32.A

## 2011-06-15 HISTORY — DX: Multiple myeloma not having achieved remission: C90.00

## 2011-06-15 HISTORY — DX: Hypothyroidism, unspecified: E03.9

## 2011-06-15 HISTORY — DX: Anxiety disorder, unspecified: F41.9

## 2011-06-15 HISTORY — DX: Disorder of kidney and ureter, unspecified: N28.9

## 2011-06-15 HISTORY — DX: Gastro-esophageal reflux disease without esophagitis: K21.9

## 2011-06-15 LAB — COMPREHENSIVE METABOLIC PANEL
ALT: 19 U/L (ref 0–35)
Albumin: 4 g/dL (ref 3.5–5.2)
Alkaline Phosphatase: 46 U/L (ref 39–117)
Potassium: 4.3 mEq/L (ref 3.5–5.1)
Sodium: 142 mEq/L (ref 135–145)
Total Protein: 6.4 g/dL (ref 6.0–8.3)

## 2011-06-15 LAB — TSH: TSH: 0.884 u[IU]/mL (ref 0.350–4.500)

## 2011-06-15 LAB — URINALYSIS, ROUTINE W REFLEX MICROSCOPIC
Glucose, UA: NEGATIVE mg/dL
Hgb urine dipstick: NEGATIVE
Leukocytes, UA: NEGATIVE
Specific Gravity, Urine: 1.009 (ref 1.005–1.030)
Urobilinogen, UA: 0.2 mg/dL (ref 0.0–1.0)

## 2011-06-15 LAB — CBC
HCT: 27.1 % — ABNORMAL LOW (ref 36.0–46.0)
Hemoglobin: 9.1 g/dL — ABNORMAL LOW (ref 12.0–15.0)
RBC: 2.62 MIL/uL — ABNORMAL LOW (ref 3.87–5.11)
WBC: 9 10*3/uL (ref 4.0–10.5)

## 2011-06-15 LAB — CARDIAC PANEL(CRET KIN+CKTOT+MB+TROPI)
CK, MB: 4.3 ng/mL — ABNORMAL HIGH (ref 0.3–4.0)
Troponin I: <0.3 ng/mL (ref <0.30)

## 2011-06-15 LAB — PRO B NATRIURETIC PEPTIDE: Pro B Natriuretic peptide (BNP): 727.9 pg/mL — ABNORMAL HIGH (ref 0–125)

## 2011-06-15 MED ORDER — HEPARIN SOD (PORK) LOCK FLUSH 100 UNIT/ML IV SOLN
INTRAVENOUS | Status: AC
  Administered 2011-06-15: 18:00:00
  Filled 2011-06-15: qty 5

## 2011-06-15 NOTE — ED Notes (Signed)
Family at bedside. Pt oob to [facility] with assist x 1 for UA sample.
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
## 2011-06-15 NOTE — ED Provider Notes (Signed)
  Physical Exam  BP 146/127  Pulse 96  Temp(Src) 98.1 F (36.7 C) (Oral)  Resp 14  Ht 4\' 11"  (1.499 m)  Wt 142 lb (64.411 kg)  BMI 28.68 kg/m2  SpO2 97%  Physical Exam  ED Course  Procedures  MDM Patient was signed out to me with lab work-up pending.  CBC was not significantly changed and renal panel was unremarkable.  UA was also unremarkable.  TSH and T4 were pending but these will not return today.  I reassessed the patient and spoke with family who continued to be comfortable with plan for discharge.  Dr. Gustavo Lah office did call back to say that if patient was still altered she should be transferred and admitted to hospitalist.  This was not the case and patient agreed with this.  Patient was discharged home in good condition.  Family and patient were comfortable with plan.      Cyndra Numbers, MD 06/15/11 [phone]

## 2011-06-15 NOTE — ED Notes (Signed)
Pt here from dr Tama Gander office for evaluation of her behavior and change in mental status pt is awake and alert she appears to be oriented but according to family at bedside she often speaks of things in the past.
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
## 2011-06-16 ENCOUNTER — Encounter (HOSPITAL_BASED_OUTPATIENT_CLINIC_OR_DEPARTMENT_OTHER): Payer: Medicare Other | Admitting: Hematology & Oncology

## 2011-06-16 ENCOUNTER — Other Ambulatory Visit: Payer: Self-pay | Admitting: Hematology & Oncology

## 2011-06-16 DIAGNOSIS — R42 Dizziness and giddiness: Secondary | ICD-10-CM

## 2011-06-16 DIAGNOSIS — C9000 Multiple myeloma not having achieved remission: Secondary | ICD-10-CM

## 2011-06-16 DIAGNOSIS — M79609 Pain in unspecified limb: Secondary | ICD-10-CM

## 2011-06-16 LAB — CBC WITH DIFFERENTIAL (CANCER CENTER ONLY)
BASO%: 0.3 % (ref 0.0–2.0)
EOS%: 1 % (ref 0.0–7.0)
HCT: 27.3 % — ABNORMAL LOW (ref 34.8–46.6)
LYMPH#: 0.9 10*3/uL (ref 0.9–3.3)
MONO#: 0.9 10*3/uL (ref 0.1–0.9)
NEUT#: 4 10*3/uL (ref 1.5–6.5)
NEUT%: 68.5 % (ref 39.6–80.0)
RDW: 20 % — ABNORMAL HIGH (ref 11.1–15.7)
WBC: 5.8 10*3/uL (ref 3.9–10.0)

## 2011-06-16 LAB — CMP (CANCER CENTER ONLY)
ALT(SGPT): 17 U/L (ref 10–47)
AST: 16 U/L (ref 11–38)
Albumin: 3.3 g/dL (ref 3.3–5.5)
CO2: 27 mEq/L (ref 18–33)
Calcium: 9 mg/dL (ref 8.0–10.3)
Chloride: 102 mEq/L (ref 98–108)
Creat: 1.3 mg/dl — ABNORMAL HIGH (ref 0.6–1.2)
Potassium: 4.5 mEq/L (ref 3.3–4.7)
Sodium: 144 mEq/L (ref 128–145)
Total Protein: 5.8 g/dL — ABNORMAL LOW (ref 6.4–8.1)

## 2011-06-16 LAB — PROTIME-INR (CHCC SATELLITE): Protime: 10.8 Seconds (ref 10.6–13.4)

## 2011-06-22 ENCOUNTER — Other Ambulatory Visit: Payer: Self-pay | Admitting: Hematology & Oncology

## 2011-06-22 ENCOUNTER — Encounter (HOSPITAL_BASED_OUTPATIENT_CLINIC_OR_DEPARTMENT_OTHER): Payer: Medicare Other | Admitting: Hematology & Oncology

## 2011-06-22 DIAGNOSIS — Z5112 Encounter for antineoplastic immunotherapy: Secondary | ICD-10-CM

## 2011-06-22 DIAGNOSIS — D801 Nonfamilial hypogammaglobulinemia: Secondary | ICD-10-CM

## 2011-06-22 DIAGNOSIS — C9000 Multiple myeloma not having achieved remission: Secondary | ICD-10-CM

## 2011-06-22 DIAGNOSIS — D649 Anemia, unspecified: Secondary | ICD-10-CM

## 2011-06-22 DIAGNOSIS — N289 Disorder of kidney and ureter, unspecified: Secondary | ICD-10-CM

## 2011-06-22 LAB — CBC WITH DIFFERENTIAL (CANCER CENTER ONLY)
BASO#: 0 10*3/uL (ref 0.0–0.2)
BASO%: 0.2 % (ref 0.0–2.0)
EOS%: 3.2 % (ref 0.0–7.0)
HCT: 28.8 % — ABNORMAL LOW (ref 34.8–46.6)
HGB: 9.8 g/dL — ABNORMAL LOW (ref 11.6–15.9)
LYMPH#: 0.6 10*3/uL — ABNORMAL LOW (ref 0.9–3.3)
LYMPH%: 12.8 % — ABNORMAL LOW (ref 14.0–48.0)
MCHC: 34 g/dL (ref 32.0–36.0)
MCV: 105 fL — ABNORMAL HIGH (ref 81–101)
NEUT%: 69.5 % (ref 39.6–80.0)
RDW: 19.2 % — ABNORMAL HIGH (ref 11.1–15.7)

## 2011-06-23 ENCOUNTER — Encounter (HOSPITAL_BASED_OUTPATIENT_CLINIC_OR_DEPARTMENT_OTHER): Payer: Medicare Other | Admitting: Hematology & Oncology

## 2011-06-23 DIAGNOSIS — C9000 Multiple myeloma not having achieved remission: Secondary | ICD-10-CM

## 2011-06-24 ENCOUNTER — Encounter (HOSPITAL_BASED_OUTPATIENT_CLINIC_OR_DEPARTMENT_OTHER): Payer: Medicare Other | Admitting: Hematology & Oncology

## 2011-06-24 DIAGNOSIS — Z5112 Encounter for antineoplastic immunotherapy: Secondary | ICD-10-CM

## 2011-06-28 LAB — LACTATE DEHYDROGENASE: LDH: 196 U/L (ref 94–250)

## 2011-06-28 LAB — SPEP & IFE WITH QIG
Albumin ELP: 66.3 % — ABNORMAL HIGH (ref 55.8–66.1)
Alpha-1-Globulin: 6.1 % — ABNORMAL HIGH (ref 2.9–4.9)
Beta 2: 5.2 % (ref 3.2–6.5)
IgM, Serum: 21 mg/dL — ABNORMAL LOW (ref 52–322)

## 2011-06-28 LAB — COMPREHENSIVE METABOLIC PANEL
ALT: 17 U/L (ref 0–35)
BUN: 15 mg/dL (ref 6–23)
CO2: 25 mEq/L (ref 19–32)
Calcium: 9 mg/dL (ref 8.4–10.5)
Chloride: 105 mEq/L (ref 96–112)
Creatinine, Ser: 1.07 mg/dL (ref 0.50–1.10)
Glucose, Bld: 113 mg/dL — ABNORMAL HIGH (ref 70–99)

## 2011-06-28 LAB — KAPPA/LAMBDA LIGHT CHAINS
Kappa free light chain: 0.78 mg/dL (ref 0.33–1.94)
Kappa:Lambda Ratio: 7.09 — ABNORMAL HIGH (ref 0.26–1.65)

## 2011-07-05 ENCOUNTER — Other Ambulatory Visit: Payer: Self-pay | Admitting: Family

## 2011-07-05 ENCOUNTER — Other Ambulatory Visit: Payer: Self-pay | Admitting: Hematology & Oncology

## 2011-07-05 ENCOUNTER — Encounter (HOSPITAL_BASED_OUTPATIENT_CLINIC_OR_DEPARTMENT_OTHER): Payer: Medicare Other | Admitting: Hematology & Oncology

## 2011-07-05 DIAGNOSIS — D649 Anemia, unspecified: Secondary | ICD-10-CM

## 2011-07-05 DIAGNOSIS — C9000 Multiple myeloma not having achieved remission: Secondary | ICD-10-CM

## 2011-07-05 DIAGNOSIS — R5381 Other malaise: Secondary | ICD-10-CM

## 2011-07-05 DIAGNOSIS — D801 Nonfamilial hypogammaglobulinemia: Secondary | ICD-10-CM

## 2011-07-05 DIAGNOSIS — Z1231 Encounter for screening mammogram for malignant neoplasm of breast: Secondary | ICD-10-CM

## 2011-07-05 DIAGNOSIS — G8929 Other chronic pain: Secondary | ICD-10-CM

## 2011-07-05 DIAGNOSIS — N289 Disorder of kidney and ureter, unspecified: Secondary | ICD-10-CM

## 2011-07-05 DIAGNOSIS — Z5112 Encounter for antineoplastic immunotherapy: Secondary | ICD-10-CM

## 2011-07-05 LAB — CBC WITH DIFFERENTIAL (CANCER CENTER ONLY)
BASO#: 0 10*3/uL (ref 0.0–0.2)
BASO%: 0.4 % (ref 0.0–2.0)
EOS%: 2.9 % (ref 0.0–7.0)
HCT: 27.1 % — ABNORMAL LOW (ref 34.8–46.6)
HGB: 9.4 g/dL — ABNORMAL LOW (ref 11.6–15.9)
LYMPH#: 1.3 10*3/uL (ref 0.9–3.3)
LYMPH%: 23.3 % (ref 14.0–48.0)
MCHC: 34.7 g/dL (ref 32.0–36.0)
MCV: 105 fL — ABNORMAL HIGH (ref 81–101)
NEUT%: 60.3 % (ref 39.6–80.0)
RDW: 18.4 % — ABNORMAL HIGH (ref 11.1–15.7)

## 2011-07-05 LAB — COMPREHENSIVE METABOLIC PANEL
AST: 17 U/L (ref 0–37)
BUN: 19 mg/dL (ref 6–23)
CO2: 23 mEq/L (ref 19–32)
Calcium: 9.4 mg/dL (ref 8.4–10.5)
Chloride: 104 mEq/L (ref 96–112)
Creatinine, Ser: 1.18 mg/dL — ABNORMAL HIGH (ref 0.50–1.10)
Total Bilirubin: 0.5 mg/dL (ref 0.3–1.2)

## 2011-07-06 ENCOUNTER — Encounter (HOSPITAL_BASED_OUTPATIENT_CLINIC_OR_DEPARTMENT_OTHER): Payer: Medicare Other | Admitting: Hematology & Oncology

## 2011-07-06 DIAGNOSIS — Z5112 Encounter for antineoplastic immunotherapy: Secondary | ICD-10-CM

## 2011-07-06 DIAGNOSIS — C9000 Multiple myeloma not having achieved remission: Secondary | ICD-10-CM

## 2011-07-09 LAB — APTT: aPTT: 45 — ABNORMAL HIGH

## 2011-07-09 LAB — PLATELET COUNT: Platelets: 237

## 2011-07-09 LAB — PROTIME-INR: INR: 0.9

## 2011-07-13 ENCOUNTER — Other Ambulatory Visit: Payer: Self-pay | Admitting: Hematology & Oncology

## 2011-07-13 ENCOUNTER — Encounter (HOSPITAL_BASED_OUTPATIENT_CLINIC_OR_DEPARTMENT_OTHER): Payer: Medicare Other | Admitting: Hematology & Oncology

## 2011-07-13 ENCOUNTER — Ambulatory Visit (INDEPENDENT_AMBULATORY_CARE_PROVIDER_SITE_OTHER)
Admission: RE | Admit: 2011-07-13 | Discharge: 2011-07-13 | Disposition: A | Discharge status: Home / Self Care | Payer: Medicare Other | Source: Ambulatory Visit | Attending: Hematology & Oncology | Admitting: Hematology & Oncology

## 2011-07-13 DIAGNOSIS — R05 Cough: Secondary | ICD-10-CM

## 2011-07-13 DIAGNOSIS — R509 Fever, unspecified: Secondary | ICD-10-CM

## 2011-07-13 DIAGNOSIS — C9000 Multiple myeloma not having achieved remission: Secondary | ICD-10-CM | POA: Insufficient documentation

## 2011-07-13 DIAGNOSIS — R059 Cough, unspecified: Secondary | ICD-10-CM | POA: Insufficient documentation

## 2011-07-13 DIAGNOSIS — M8448XA Pathological fracture, other site, initial encounter for fracture: Secondary | ICD-10-CM | POA: Insufficient documentation

## 2011-07-13 LAB — CBC WITH DIFFERENTIAL (CANCER CENTER ONLY)
BASO#: 0 10*3/uL (ref 0.0–0.2)
Eosinophils Absolute: 0 10*3/uL (ref 0.0–0.5)
HCT: 27 % — ABNORMAL LOW (ref 34.8–46.6)
HGB: 9.2 g/dL — ABNORMAL LOW (ref 11.6–15.9)
MCH: 36.2 pg — ABNORMAL HIGH (ref 26.0–34.0)
MCHC: 34.1 g/dL (ref 32.0–36.0)
MONO%: 8.5 % (ref 0.0–13.0)
NEUT#: 8 10*3/uL — ABNORMAL HIGH (ref 1.5–6.5)
NEUT%: 82.9 % — ABNORMAL HIGH (ref 39.6–80.0)
RBC: 2.54 10*6/uL — ABNORMAL LOW (ref 3.70–5.32)

## 2011-07-13 LAB — CMP (CANCER CENTER ONLY)
Albumin: 3.6 g/dL (ref 3.3–5.5)
Alkaline Phosphatase: 51 U/L (ref 26–84)
BUN, Bld: 16 mg/dL (ref 7–22)
Calcium: 9.6 mg/dL (ref 8.0–10.3)
Chloride: 100 mEq/L (ref 98–108)
Creat: 1.2 mg/dl (ref 0.6–1.2)
Glucose, Bld: 173 mg/dL — ABNORMAL HIGH (ref 73–118)
Potassium: 3.7 mEq/L (ref 3.3–4.7)

## 2011-07-14 ENCOUNTER — Encounter: Payer: Medicare Other | Admitting: Hematology & Oncology

## 2011-07-14 ENCOUNTER — Inpatient Hospital Stay (HOSPITAL_COMMUNITY)
Admission: AD | Admit: 2011-07-14 | Discharge: 2011-07-22 | DRG: 866 | Disposition: A | Discharge status: Home / Self Care | Payer: Medicare Other | Source: Ambulatory Visit | Attending: Hematology & Oncology | Admitting: Hematology & Oncology

## 2011-07-14 ENCOUNTER — Ambulatory Visit (HOSPITAL_COMMUNITY): Payer: Medicare Other

## 2011-07-14 DIAGNOSIS — R197 Diarrhea, unspecified: Secondary | ICD-10-CM | POA: Diagnosis not present

## 2011-07-14 DIAGNOSIS — D61818 Other pancytopenia: Secondary | ICD-10-CM | POA: Diagnosis present

## 2011-07-14 DIAGNOSIS — E039 Hypothyroidism, unspecified: Secondary | ICD-10-CM | POA: Diagnosis present

## 2011-07-14 DIAGNOSIS — Z23 Encounter for immunization: Secondary | ICD-10-CM

## 2011-07-14 DIAGNOSIS — I10 Essential (primary) hypertension: Secondary | ICD-10-CM | POA: Diagnosis present

## 2011-07-14 DIAGNOSIS — C9000 Multiple myeloma not having achieved remission: Secondary | ICD-10-CM

## 2011-07-14 DIAGNOSIS — IMO0001 Reserved for inherently not codable concepts without codable children: Secondary | ICD-10-CM | POA: Diagnosis present

## 2011-07-14 DIAGNOSIS — E876 Hypokalemia: Secondary | ICD-10-CM | POA: Diagnosis present

## 2011-07-14 DIAGNOSIS — J45909 Unspecified asthma, uncomplicated: Secondary | ICD-10-CM | POA: Diagnosis present

## 2011-07-14 DIAGNOSIS — D631 Anemia in chronic kidney disease: Secondary | ICD-10-CM | POA: Diagnosis present

## 2011-07-14 DIAGNOSIS — J984 Other disorders of lung: Secondary | ICD-10-CM | POA: Diagnosis present

## 2011-07-14 DIAGNOSIS — Z880 Allergy status to penicillin: Secondary | ICD-10-CM

## 2011-07-14 DIAGNOSIS — D839 Common variable immunodeficiency, unspecified: Secondary | ICD-10-CM | POA: Diagnosis present

## 2011-07-14 DIAGNOSIS — N289 Disorder of kidney and ureter, unspecified: Secondary | ICD-10-CM | POA: Diagnosis present

## 2011-07-14 DIAGNOSIS — B370 Candidal stomatitis: Secondary | ICD-10-CM | POA: Diagnosis not present

## 2011-07-14 DIAGNOSIS — R112 Nausea with vomiting, unspecified: Secondary | ICD-10-CM | POA: Diagnosis present

## 2011-07-14 DIAGNOSIS — D630 Anemia in neoplastic disease: Secondary | ICD-10-CM | POA: Diagnosis present

## 2011-07-14 DIAGNOSIS — Z8701 Personal history of pneumonia (recurrent): Secondary | ICD-10-CM

## 2011-07-14 DIAGNOSIS — B9789 Other viral agents as the cause of diseases classified elsewhere: Principal | ICD-10-CM | POA: Diagnosis present

## 2011-07-14 DIAGNOSIS — Z79899 Other long term (current) drug therapy: Secondary | ICD-10-CM

## 2011-07-15 ENCOUNTER — Inpatient Hospital Stay (HOSPITAL_COMMUNITY): Payer: Medicare Other

## 2011-07-15 LAB — TSH: TSH: 1.633 u[IU]/mL (ref 0.350–4.500)

## 2011-07-15 LAB — COMPREHENSIVE METABOLIC PANEL
ALT: 29 U/L (ref 0–35)
Alkaline Phosphatase: 45 U/L (ref 39–117)
CO2: 28 mEq/L (ref 19–32)
Chloride: 107 mEq/L (ref 96–112)
GFR calc Af Amer: >60 mL/min (ref >60)
GFR calc non Af Amer: 54 mL/min — ABNORMAL LOW (ref >60)
Glucose, Bld: 126 mg/dL — ABNORMAL HIGH (ref 70–99)
Potassium: 3.3 mEq/L — ABNORMAL LOW (ref 3.5–5.1)
Sodium: 142 mEq/L (ref 135–145)
Total Bilirubin: 0.2 mg/dL — ABNORMAL LOW (ref 0.3–1.2)

## 2011-07-15 LAB — CBC
HCT: 22.6 % — ABNORMAL LOW (ref 36.0–46.0)
Hemoglobin: 7.4 g/dL — ABNORMAL LOW (ref 12.0–15.0)
RBC: 2.09 MIL/uL — ABNORMAL LOW (ref 3.87–5.11)

## 2011-07-15 MED ORDER — IOHEXOL 300 MG/ML  SOLN
100.0000 mL | Freq: Once | INTRAMUSCULAR | Status: AC | PRN
  Administered 2011-07-15: 86 mL via INTRAVENOUS

## 2011-07-16 LAB — BASIC METABOLIC PANEL
CO2: 26 mEq/L (ref 19–32)
Calcium: 8.5 mg/dL (ref 8.4–10.5)
Creatinine, Ser: 0.95 mg/dL (ref 0.50–1.10)
GFR calc non Af Amer: 59 mL/min — ABNORMAL LOW (ref >60)
Glucose, Bld: 104 mg/dL — ABNORMAL HIGH (ref 70–99)
Sodium: 141 mEq/L (ref 135–145)

## 2011-07-16 LAB — URINE CULTURE
Colony Count: NO GROWTH
Culture: NO GROWTH
Special Requests: NEGATIVE

## 2011-07-16 LAB — CBC
Hemoglobin: 7.3 g/dL — ABNORMAL LOW (ref 12.0–15.0)
MCH: 35.3 pg — ABNORMAL HIGH (ref 26.0–34.0)
MCHC: 32.6 g/dL (ref 30.0–36.0)
MCV: 108.2 fL — ABNORMAL HIGH (ref 78.0–100.0)
Platelets: 123 10*3/uL — ABNORMAL LOW (ref 150–400)

## 2011-07-17 DIAGNOSIS — C9000 Multiple myeloma not having achieved remission: Secondary | ICD-10-CM

## 2011-07-17 DIAGNOSIS — R509 Fever, unspecified: Secondary | ICD-10-CM

## 2011-07-17 LAB — TYPE AND SCREEN: Unit division: 0

## 2011-07-17 LAB — BASIC METABOLIC PANEL
Calcium: 8.9 mg/dL (ref 8.4–10.5)
GFR calc Af Amer: >60 mL/min (ref >60)
GFR calc non Af Amer: 56 mL/min — ABNORMAL LOW (ref >60)
Potassium: 3.5 mEq/L (ref 3.5–5.1)
Sodium: 142 mEq/L (ref 135–145)

## 2011-07-17 LAB — CBC
Hemoglobin: 10.5 g/dL — ABNORMAL LOW (ref 12.0–15.0)
MCH: 33.3 pg (ref 26.0–34.0)
MCHC: 33.2 g/dL (ref 30.0–36.0)
Platelets: 130 10*3/uL — ABNORMAL LOW (ref 150–400)
RDW: 20.5 % — ABNORMAL HIGH (ref 11.5–15.5)

## 2011-07-18 DIAGNOSIS — R5081 Fever presenting with conditions classified elsewhere: Secondary | ICD-10-CM

## 2011-07-18 DIAGNOSIS — C9000 Multiple myeloma not having achieved remission: Secondary | ICD-10-CM

## 2011-07-18 DIAGNOSIS — D709 Neutropenia, unspecified: Secondary | ICD-10-CM

## 2011-07-18 LAB — BASIC METABOLIC PANEL
CO2: 29 mEq/L (ref 19–32)
Calcium: 8.5 mg/dL (ref 8.4–10.5)
Glucose, Bld: 96 mg/dL (ref 70–99)
Potassium: 3.1 mEq/L — ABNORMAL LOW (ref 3.5–5.1)
Sodium: 143 mEq/L (ref 135–145)

## 2011-07-18 LAB — CBC
Hemoglobin: 10.3 g/dL — ABNORMAL LOW (ref 12.0–15.0)
MCH: 34.1 pg — ABNORMAL HIGH (ref 26.0–34.0)
Platelets: 129 10*3/uL — ABNORMAL LOW (ref 150–400)
RBC: 3.02 MIL/uL — ABNORMAL LOW (ref 3.87–5.11)
WBC: 3.9 10*3/uL — ABNORMAL LOW (ref 4.0–10.5)

## 2011-07-19 DIAGNOSIS — C9000 Multiple myeloma not having achieved remission: Secondary | ICD-10-CM

## 2011-07-19 LAB — DIFFERENTIAL
Basophils Absolute: 0 10*3/uL (ref 0.0–0.1)
Basophils Relative: 0 % (ref 0–1)
Eosinophils Absolute: 0.2 10*3/uL (ref 0.0–0.7)
Lymphocytes Relative: 15 % (ref 12–46)
Monocytes Relative: 15 % — ABNORMAL HIGH (ref 3–12)
Neutro Abs: 2.1 10*3/uL (ref 1.7–7.7)
Neutrophils Relative %: 64 % (ref 43–77)

## 2011-07-19 LAB — PROTIME-INR
INR: 1.03 (ref 0.00–1.49)
Prothrombin Time: 13.7 seconds (ref 11.6–15.2)

## 2011-07-19 LAB — CBC
HCT: 27 % — ABNORMAL LOW (ref 36.0–46.0)
Platelets: 123 10*3/uL — ABNORMAL LOW (ref 150–400)
RBC: 2.64 MIL/uL — ABNORMAL LOW (ref 3.87–5.11)
RDW: 19.1 % — ABNORMAL HIGH (ref 11.5–15.5)
WBC: 3.3 10*3/uL — ABNORMAL LOW (ref 4.0–10.5)

## 2011-07-19 LAB — BASIC METABOLIC PANEL
CO2: 23 mEq/L (ref 19–32)
Chloride: 115 mEq/L — ABNORMAL HIGH (ref 96–112)
GFR calc Af Amer: >90 mL/min (ref >90)
Potassium: 2.8 mEq/L — ABNORMAL LOW (ref 3.5–5.1)

## 2011-07-19 LAB — CULTURE, BLOOD (SINGLE)

## 2011-07-20 ENCOUNTER — Inpatient Hospital Stay (HOSPITAL_COMMUNITY): Payer: Medicare Other

## 2011-07-20 LAB — BASIC METABOLIC PANEL
BUN: 8 mg/dL (ref 6–23)
GFR calc Af Amer: 70 mL/min — ABNORMAL LOW (ref >90)
GFR calc non Af Amer: 60 mL/min — ABNORMAL LOW (ref >90)
Potassium: 4.6 mEq/L (ref 3.5–5.1)
Sodium: 140 mEq/L (ref 135–145)

## 2011-07-20 LAB — CBC
MCHC: 32.3 g/dL (ref 30.0–36.0)
Platelets: 144 10*3/uL — ABNORMAL LOW (ref 150–400)
RDW: 18.7 % — ABNORMAL HIGH (ref 11.5–15.5)
WBC: 3.9 10*3/uL — ABNORMAL LOW (ref 4.0–10.5)

## 2011-07-20 LAB — PREALBUMIN: Prealbumin: 20 mg/dL (ref 17.0–34.0)

## 2011-07-21 ENCOUNTER — Inpatient Hospital Stay (HOSPITAL_COMMUNITY): Payer: Medicare Other

## 2011-07-21 ENCOUNTER — Other Ambulatory Visit: Payer: Self-pay | Admitting: Interventional Radiology

## 2011-07-21 LAB — CBC
Hemoglobin: 11.2 g/dL — ABNORMAL LOW (ref 12.0–15.0)
MCH: 33.4 pg (ref 26.0–34.0)
MCHC: 32.7 g/dL (ref 30.0–36.0)
Platelets: 156 10*3/uL (ref 150–400)
RDW: 18.1 % — ABNORMAL HIGH (ref 11.5–15.5)

## 2011-07-21 LAB — CULTURE, BLOOD (ROUTINE X 2)
Culture  Setup Time: 201209270357
Culture: NO GROWTH

## 2011-07-21 LAB — BASIC METABOLIC PANEL
Calcium: 10.6 mg/dL — ABNORMAL HIGH (ref 8.4–10.5)
GFR calc Af Amer: 69 mL/min — ABNORMAL LOW (ref >90)
GFR calc non Af Amer: 60 mL/min — ABNORMAL LOW (ref >90)
Potassium: 4.3 mEq/L (ref 3.5–5.1)
Sodium: 142 mEq/L (ref 135–145)

## 2011-07-22 LAB — CBC
Hemoglobin: 10.5 g/dL — ABNORMAL LOW (ref 12.0–15.0)
Platelets: 156 10*3/uL (ref 150–400)
RBC: 3.14 MIL/uL — ABNORMAL LOW (ref 3.87–5.11)

## 2011-07-22 LAB — BASIC METABOLIC PANEL
CO2: 30 mEq/L (ref 19–32)
Calcium: 10.5 mg/dL (ref 8.4–10.5)
GFR calc non Af Amer: 55 mL/min — ABNORMAL LOW (ref >90)
Glucose, Bld: 121 mg/dL — ABNORMAL HIGH (ref 70–99)
Potassium: 3.9 mEq/L (ref 3.5–5.1)
Sodium: 141 mEq/L (ref 135–145)

## 2011-08-02 ENCOUNTER — Ambulatory Visit: Payer: Medicare Other

## 2011-08-04 ENCOUNTER — Other Ambulatory Visit: Payer: Self-pay | Admitting: Hematology & Oncology

## 2011-08-04 ENCOUNTER — Encounter (HOSPITAL_BASED_OUTPATIENT_CLINIC_OR_DEPARTMENT_OTHER): Payer: Medicare Other | Admitting: Hematology & Oncology

## 2011-08-04 DIAGNOSIS — D649 Anemia, unspecified: Secondary | ICD-10-CM

## 2011-08-04 DIAGNOSIS — D801 Nonfamilial hypogammaglobulinemia: Secondary | ICD-10-CM

## 2011-08-04 DIAGNOSIS — N289 Disorder of kidney and ureter, unspecified: Secondary | ICD-10-CM

## 2011-08-04 LAB — COMPREHENSIVE METABOLIC PANEL
Albumin: 4.1 g/dL (ref 3.5–5.2)
BUN: 13 mg/dL (ref 6–23)
CO2: 25 mEq/L (ref 19–32)
Calcium: 9.4 mg/dL (ref 8.4–10.5)
Glucose, Bld: 94 mg/dL (ref 70–99)
Potassium: 4.1 mEq/L (ref 3.5–5.3)
Sodium: 141 mEq/L (ref 135–145)
Total Protein: 6.6 g/dL (ref 6.0–8.3)

## 2011-08-04 LAB — CBC WITH DIFFERENTIAL (CANCER CENTER ONLY)
BASO#: 0 10*3/uL (ref 0.0–0.2)
Eosinophils Absolute: 0.2 10*3/uL (ref 0.0–0.5)
HGB: 12.5 g/dL (ref 11.6–15.9)
MCH: 35.3 pg — ABNORMAL HIGH (ref 26.0–34.0)
MCV: 103 fL — ABNORMAL HIGH (ref 81–101)
MONO#: 0.7 10*3/uL (ref 0.1–0.9)
MONO%: 14.9 % — ABNORMAL HIGH (ref 0.0–13.0)
NEUT#: 2.6 10*3/uL (ref 1.5–6.5)
Platelets: 167 10*3/uL (ref 145–400)
RBC: 3.54 10*6/uL — ABNORMAL LOW (ref 3.70–5.32)
WBC: 4.4 10*3/uL (ref 3.9–10.0)

## 2011-08-05 ENCOUNTER — Encounter: Payer: Medicare Other | Admitting: Hematology & Oncology

## 2011-08-05 ENCOUNTER — Ambulatory Visit: Payer: Medicare Other | Attending: Hematology & Oncology

## 2011-08-05 DIAGNOSIS — M6281 Muscle weakness (generalized): Secondary | ICD-10-CM | POA: Insufficient documentation

## 2011-08-05 DIAGNOSIS — C9000 Multiple myeloma not having achieved remission: Secondary | ICD-10-CM

## 2011-08-05 DIAGNOSIS — R5381 Other malaise: Secondary | ICD-10-CM | POA: Insufficient documentation

## 2011-08-05 DIAGNOSIS — Z5112 Encounter for antineoplastic immunotherapy: Secondary | ICD-10-CM

## 2011-08-05 DIAGNOSIS — IMO0001 Reserved for inherently not codable concepts without codable children: Secondary | ICD-10-CM | POA: Insufficient documentation

## 2011-08-05 NOTE — Discharge Summary (Signed)
[name], [name] NO.:  [id number]  MEDICAL RECORD NO.:  [id number]  LOCATION:  1311                         FACILITY:  [facility]  PHYSICIAN:  Josph Macho, M.D.  DATE OF BIRTH:  [birth year]  DATE OF ADMISSION:  07/14/2011 DATE OF DISCHARGE:  07/22/2011                              DISCHARGE SUMMARY   DISCHARGE DIAGNOSES: 1. Fever, likely secondary to viral syndrome. 2. Recurrent kappa light chain myeloma. 3. Patient is status post bone marrow biopsy and aspirate by     Interventional Radiology. 4. Chronic muscle pain. 5. Hypokalemia. 6. 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  CONDITION UPON DISCHARGE:  Stable.  ACTIVITIES:  As tolerated.  The patient will likely need outpatient physical therapy to try to help keep her leg strength up.  FOLLOWUP:  The patient will follow up with Dr. Myna Hidalgo in the [facility] in a week or so.  DISCHARGE MEDICATIONS: 1. Advair Diskus (250/50) one puff p.o. b.i.d. 2. Acyclovir 800 mg p.o. b.i.d. 3. Ambien CR 12.5 mg p.o. q.h.s. 4. Aspirin 81 mg p.o. q. day. 5. Celebrex 100 mg p.o. b.i.d. 6. Cymbalta 30 mg p.o. q. day. 7. Detrol LA 4 mg p.o. q. day. 8. Valium 5 mg p.o. q.6 hours p.r.n. 9. Lomotil 1-2 p.o. q.6 hours p.r.n. diarrhea. 10.Vicodin (10/500) one p.o. q.6 hours p.r.n. 11.Nexium 40 mg p.o. q. day. 12.Olopatadine eyedrops 0.1% one drop both eyes b.i.d. 13.Synthroid 0.05 mg p.o. q. day. 14.Vitamin D 50,000 units p.o. q. week.  HOSPITAL COURSE:  [name] was admitted because of fever.  She had a temperature of about 102 at home.  We admitted her.  Because of her myeloma, we started her empirically on IV antibiotics.  She was placed on Primaxin and vancomycin.  All of her cultures came back negative.  She had urine and blood cultures done. Again, these were all negative.  We did do a chest x-ray on her.  This was done in the office on the 25th.  The chest x-ray showed  some myelomatous involvement of the left clavicle.  There is no parenchymal lung involvement.  I did order a CT of the chest.  This was done on the 27th.  This did not show any evidence of pulmonary emboli.  She had some 3- mm nodules in the right upper lobe.  These were felt to be more inflammatory/infectious.  There are small bilateral pleural effusions. There is no hilar or mediastinal adenopathy.  Again, her cultures were all negative.  We did stop her antibiotics.  I felt that she may have had a viral syndrome.  She was put on a Tamiflu in the office and we continued her on this.  She did have problems with hypokalemia.  This may have been from the diuretic.  She was on torsemide.  I did start the torsemide as blood pressure was not a real problem for her.  We did replace her potassium with IV.  On the 4th, a count of her potassium was 3.9.  She will hold on taking potassium at home.  She did receive a dose of Aranesp in the hospital.  She got 300 mcg on July 19, 2011.  She responded well to this.  On the 3rd, her hemoglobin was 11.2.  On the 4th, her CBC showed a white cell count of 4, hemoglobin 10.5, hematocrit 32.8, platelet count 156.  Her BUN and creatinine were okay during the hospitalization.  On discharge, her sodium was 141, potassium 3.9, BUN 11, creatinine 1.05. Calcium was 10.5.  She did have a bone marrow biopsy and aspirate done by Interventional Radiology.  She was due to have one done to assess for response to her chemotherapy.  This was done on October 3.  She tolerated this well.  She defervesced quite nicely.  Again, she did not have any temperature over the past four hospital days.  Upon discharge, her temperature was 98.  Her blood pressure upon discharge was 117/65.  She was not complaining of any dyspnea.  There was no cough.  The patient does have asthma.  We did give her some nebulizers and inhalers in the hospital.  I thought she would  benefit from Advair as an outpatient.  As such, we gave her a prescription for Advair as an outpatient.  She did get physical therapy in the hospital.  I would like to continue physical therapy as an outpatient.  I think this will help her out quite a bit.  I felt that she was ready to be discharged home.  DISCHARGE PHYSICAL EXAM:  GENERAL:  Shows a well-developed, well- nourished white female, in no obvious distress.  She is alert and oriented times 3. HEAD AND NECK:  Show some trace periorbital edema.  She has good extraocular muscle movement.  She has no adenopathy in the neck.  There is no oral thrush. LUNGS:  Clear bilaterally.  She has occasional expiratory wheeze, which is mild. CARDIAC:  Regular rate and rhythm with normal S1 and S2.  There are no murmurs, rubs, or bruits. ABDOMINAL  EXAM:  Soft with good bowel sounds.  There is no palpable abdominal mass.  There is no fluid wave.  There is no palpable hepatosplenomegaly. EXTREMITIES:  Show firmness in her thighs, which is chronic.  She has no edema in her lower legs. NEUROLOGICAL EXAM:  Show no focal neurological deficits.     Josph Macho, M.D.     PRE/MEDQ  D:  07/22/2011  T:  07/22/2011  Job:  [id number]  Electronically Signed by Arlan Organ  on 08/05/2011 07:17:48 AM

## 2011-08-05 NOTE — H&P (Signed)
[name], [name] NO.:  [id number]  MEDICAL RECORD NO.:  [id number]  LOCATION:  1311                         FACILITY:  [facility]  PHYSICIAN:  Josph Macho, M.D.  DATE OF BIRTH:  08-Dec-1945  DATE OF ADMISSION:  07/14/2011 DATE OF DISCHARGE:                             HISTORY & PHYSICAL   REASON FOR ADMISSION: 1. Fever of unclear etiology. 2. Recurrent kappa light chain myeloma. 3. Nausea and vomiting. 4. Hypothyroidism. 5. History of asthma. 6. Probable viral syndrome.  HISTORY OF PRESENT ILLNESS:  [name] is a 65 year old white female with recurrent kappa light chain myeloma.  She has been on chemotherapy with Kyprolis.  Her last dose was back on September 18th.  She is in the office today again, IVIG.  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  She has had problems of pneumonia in the past.  She has had a temperature for the past couple of days.  On Tuesday, her temperature was 101.9.  We saw her in the office.  We gave her a dose of Rocephin.  We got a chest x-ray on her, which did not show any infiltrate.  I put her on some Tamiflu thinking that this may have been a viral syndrome.  She said she had diffuse arthralgias and myalgias.  In the office today, she had a low-grade temperature.  She is given IVIG.  She has some bouts of nausea and vomiting.  I felt that she needed to be admitted to help prevent dehydration, also to try to identify source of infection.  There has been no diarrhea.  She has had continued arthralgias.  There has been no rashes.  She has had, maybe a little bit of a sore throat. She has had no headache.  There has been no visual problems.  As such, we admitted her for management.  PAST MEDICAL HISTORY:  Remarkable for: 1. 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 3. Hypothyroidism. 4. History of asthma. 5. Hypertension.  ALLERGIES: 1. PENICILLIN. 2. DILAUDID. 3.  MORPHINE.  ADMISSION MEDICATIONS:  Are in the medication reconciliation sheet.  SOCIAL HISTORY:  Negative for tobacco or alcohol use.  PHYSICAL EXAM:  GENERAL:  This is a somewhat ill-appearing white female, in no obvious distress. VITAL SIGNS:  Temperature 100.9, pulse 88, respiratory rate 16, blood pressure 118/69. HEAD AND NECK EXAM:  Shows normocephalic, atraumatic skull.  There are no ocular or oral lesions.  There is no thrush.  There is no adenopathy in her neck.  Pupils react appropriately. LUNGS:  Show some scattered crackles bilaterally.  She has occasional expiratory wheeze. CARDIAC EXAM:  Regular rate and rhythm with normal S1 and S2.  There are no murmurs, rubs, or bruits. ABDOMINAL EXAM:  Soft with good bowel sounds.  There is no fluid wave. No palpable hepatosplenomegaly is noted.  There is no guarding or rebound tenderness. EXTREMITIES:  Show firmness in her thighs.  This is chronic for her. She has had thigh surgery in the past. SKIN EXAM:  Shows no rashes. NEUROLOGICAL EXAM:  No focal neurological deficits.  Labs are pending.  IMPRESSION:  [name] is a 65 year old white female with acute recurrent kappa light  chain myeloma.  She is being admitted now because of fever.  We will do blood and urine cultures on her.  We will start her on Primaxin.  I will continue her on the Tamiflu for right now.  We will get a CT of the chest to make sure that we are not missing some type of pneumonitis.  We will put her on IV fluids.  We will support her along.  Hopefully, this is just a viral syndrome so that she will improve.  For now, she is a full code.     Josph Macho, M.D.     PRE/MEDQ  D:  07/15/2011  T:  07/15/2011  Job:  [id number]  Electronically Signed by Arlan Organ  on 08/05/2011 07:17:50 AM

## 2011-08-09 ENCOUNTER — Ambulatory Visit: Payer: Medicare Other | Admitting: Physical Therapy

## 2011-08-10 ENCOUNTER — Other Ambulatory Visit: Payer: Self-pay | Admitting: Family

## 2011-08-10 ENCOUNTER — Ambulatory Visit (HOSPITAL_BASED_OUTPATIENT_CLINIC_OR_DEPARTMENT_OTHER): Payer: Medicare Other | Admitting: Hematology & Oncology

## 2011-08-10 DIAGNOSIS — N289 Disorder of kidney and ureter, unspecified: Secondary | ICD-10-CM

## 2011-08-10 DIAGNOSIS — R5381 Other malaise: Secondary | ICD-10-CM

## 2011-08-10 DIAGNOSIS — G8929 Other chronic pain: Secondary | ICD-10-CM

## 2011-08-10 DIAGNOSIS — D630 Anemia in neoplastic disease: Secondary | ICD-10-CM

## 2011-08-10 DIAGNOSIS — R5383 Other fatigue: Secondary | ICD-10-CM

## 2011-08-10 DIAGNOSIS — Z5111 Encounter for antineoplastic chemotherapy: Secondary | ICD-10-CM

## 2011-08-10 DIAGNOSIS — C9000 Multiple myeloma not having achieved remission: Secondary | ICD-10-CM

## 2011-08-10 LAB — COMPREHENSIVE METABOLIC PANEL
Albumin: 4.2 g/dL (ref 3.5–5.2)
Alkaline Phosphatase: 41 U/L (ref 39–117)
Calcium: 9.4 mg/dL (ref 8.4–10.5)
Chloride: 104 mEq/L (ref 96–112)
Glucose, Bld: 109 mg/dL — ABNORMAL HIGH (ref 70–99)
Potassium: 4.1 mEq/L (ref 3.5–5.3)
Sodium: 143 mEq/L (ref 135–145)
Total Protein: 6.1 g/dL (ref 6.0–8.3)

## 2011-08-10 LAB — CBC WITH DIFFERENTIAL (CANCER CENTER ONLY)
BASO#: 0 10*3/uL (ref 0.0–0.2)
Eosinophils Absolute: 0.4 10*3/uL (ref 0.0–0.5)
HGB: 12.2 g/dL (ref 11.6–15.9)
LYMPH#: 1 10*3/uL (ref 0.9–3.3)
MONO#: 0.7 10*3/uL (ref 0.1–0.9)
MONO%: 12 % (ref 0.0–13.0)
NEUT#: 3.6 10*3/uL (ref 1.5–6.5)
Platelets: 114 10*3/uL — ABNORMAL LOW (ref 145–400)
RBC: 3.48 10*6/uL — ABNORMAL LOW (ref 3.70–5.32)
WBC: 5.7 10*3/uL (ref 3.9–10.0)

## 2011-08-11 ENCOUNTER — Encounter (HOSPITAL_BASED_OUTPATIENT_CLINIC_OR_DEPARTMENT_OTHER): Payer: Medicare Other | Admitting: Hematology & Oncology

## 2011-08-11 ENCOUNTER — Encounter: Payer: Self-pay | Admitting: *Deleted

## 2011-08-11 DIAGNOSIS — Z5112 Encounter for antineoplastic immunotherapy: Secondary | ICD-10-CM

## 2011-08-11 DIAGNOSIS — C9000 Multiple myeloma not having achieved remission: Secondary | ICD-10-CM

## 2011-08-12 ENCOUNTER — Encounter (HOSPITAL_BASED_OUTPATIENT_CLINIC_OR_DEPARTMENT_OTHER): Payer: Medicare Other | Admitting: Hematology & Oncology

## 2011-08-12 DIAGNOSIS — C9000 Multiple myeloma not having achieved remission: Secondary | ICD-10-CM

## 2011-08-12 DIAGNOSIS — Z5112 Encounter for antineoplastic immunotherapy: Secondary | ICD-10-CM

## 2011-08-16 ENCOUNTER — Encounter (HOSPITAL_BASED_OUTPATIENT_CLINIC_OR_DEPARTMENT_OTHER): Payer: Medicare Other | Admitting: Hematology & Oncology

## 2011-08-16 ENCOUNTER — Other Ambulatory Visit: Payer: Self-pay | Admitting: Hematology & Oncology

## 2011-08-16 ENCOUNTER — Ambulatory Visit: Payer: Medicare Other | Admitting: Physical Therapy

## 2011-08-16 DIAGNOSIS — Z5112 Encounter for antineoplastic immunotherapy: Secondary | ICD-10-CM

## 2011-08-16 DIAGNOSIS — C9000 Multiple myeloma not having achieved remission: Secondary | ICD-10-CM

## 2011-08-16 DIAGNOSIS — D801 Nonfamilial hypogammaglobulinemia: Secondary | ICD-10-CM

## 2011-08-16 DIAGNOSIS — N289 Disorder of kidney and ureter, unspecified: Secondary | ICD-10-CM

## 2011-08-16 DIAGNOSIS — D649 Anemia, unspecified: Secondary | ICD-10-CM

## 2011-08-16 DIAGNOSIS — Z5111 Encounter for antineoplastic chemotherapy: Secondary | ICD-10-CM

## 2011-08-16 LAB — CMP (CANCER CENTER ONLY)
ALT(SGPT): 20 U/L (ref 10–47)
Alkaline Phosphatase: 42 U/L (ref 26–84)
Sodium: 138 mEq/L (ref 128–145)
Total Bilirubin: 0.6 mg/dl (ref 0.20–1.60)
Total Protein: 6.8 g/dL (ref 6.4–8.1)

## 2011-08-16 LAB — CBC WITH DIFFERENTIAL (CANCER CENTER ONLY)
EOS%: 2.4 % (ref 0.0–7.0)
Eosinophils Absolute: 0.2 10*3/uL (ref 0.0–0.5)
LYMPH#: 1 10*3/uL (ref 0.9–3.3)
MCH: 34.9 pg — ABNORMAL HIGH (ref 26.0–34.0)
MCHC: 34.2 g/dL (ref 32.0–36.0)
MONO%: 10.5 % (ref 0.0–13.0)
NEUT#: 4.9 10*3/uL (ref 1.5–6.5)
Platelets: 108 10*3/uL — ABNORMAL LOW (ref 145–400)
RBC: 3.35 10*6/uL — ABNORMAL LOW (ref 3.70–5.32)

## 2011-08-16 LAB — TECHNOLOGIST REVIEW CHCC SATELLITE

## 2011-08-16 LAB — IRON AND TIBC: %SAT: 40 % (ref 20–55)

## 2011-08-17 ENCOUNTER — Encounter (HOSPITAL_BASED_OUTPATIENT_CLINIC_OR_DEPARTMENT_OTHER): Payer: Medicare Other | Admitting: Hematology & Oncology

## 2011-08-17 DIAGNOSIS — Z5112 Encounter for antineoplastic immunotherapy: Secondary | ICD-10-CM

## 2011-08-17 DIAGNOSIS — C9000 Multiple myeloma not having achieved remission: Secondary | ICD-10-CM

## 2011-08-18 ENCOUNTER — Ambulatory Visit: Payer: Medicare Other

## 2011-08-23 ENCOUNTER — Ambulatory Visit: Payer: Medicare Other | Attending: Hematology & Oncology | Admitting: Physical Therapy

## 2011-08-23 DIAGNOSIS — R5381 Other malaise: Secondary | ICD-10-CM | POA: Insufficient documentation

## 2011-08-23 DIAGNOSIS — M6281 Muscle weakness (generalized): Secondary | ICD-10-CM | POA: Insufficient documentation

## 2011-08-23 DIAGNOSIS — IMO0001 Reserved for inherently not codable concepts without codable children: Secondary | ICD-10-CM | POA: Insufficient documentation

## 2011-08-25 ENCOUNTER — Ambulatory Visit: Payer: Medicare Other | Admitting: Physical Therapy

## 2011-08-30 ENCOUNTER — Other Ambulatory Visit: Payer: Self-pay | Admitting: Hematology & Oncology

## 2011-08-30 ENCOUNTER — Ambulatory Visit (HOSPITAL_BASED_OUTPATIENT_CLINIC_OR_DEPARTMENT_OTHER): Payer: Medicare Other | Admitting: Lab

## 2011-08-30 ENCOUNTER — Ambulatory Visit: Payer: Medicare Other

## 2011-08-30 DIAGNOSIS — C9000 Multiple myeloma not having achieved remission: Secondary | ICD-10-CM

## 2011-08-30 DIAGNOSIS — G8929 Other chronic pain: Secondary | ICD-10-CM

## 2011-08-30 DIAGNOSIS — R5383 Other fatigue: Secondary | ICD-10-CM

## 2011-08-30 DIAGNOSIS — R5381 Other malaise: Secondary | ICD-10-CM

## 2011-08-30 DIAGNOSIS — Z5112 Encounter for antineoplastic immunotherapy: Secondary | ICD-10-CM

## 2011-08-30 DIAGNOSIS — Z5111 Encounter for antineoplastic chemotherapy: Secondary | ICD-10-CM

## 2011-08-30 LAB — CBC WITH DIFFERENTIAL (CANCER CENTER ONLY)
BASO#: 0 10*3/uL (ref 0.0–0.2)
BASO%: 0.5 % (ref 0.0–2.0)
HCT: 27.6 % — ABNORMAL LOW (ref 34.8–46.6)
HGB: 9.5 g/dL — ABNORMAL LOW (ref 11.6–15.9)
LYMPH#: 0.5 10*3/uL — ABNORMAL LOW (ref 0.9–3.3)
MONO#: 0.4 10*3/uL (ref 0.1–0.9)
NEUT%: 72.1 % (ref 39.6–80.0)
RBC: 2.66 10*6/uL — ABNORMAL LOW (ref 3.70–5.32)
WBC: 4 10*3/uL (ref 3.9–10.0)

## 2011-08-30 MED ORDER — ALTEPLASE 2 MG IJ SOLR
2.0000 mg | Freq: Once | INTRAMUSCULAR | Status: AC | PRN
  Administered 2011-08-30: 2 mg
  Filled 2011-08-30: qty 2

## 2011-08-30 MED ORDER — DEXTROSE 5 % IV SOLN
25.0000 mg/m2 | Freq: Once | INTRAVENOUS | Status: AC
  Administered 2011-08-30: 40 mg via INTRAVENOUS
  Filled 2011-08-30: qty 20

## 2011-08-30 MED ORDER — SODIUM CHLORIDE 0.9 % IV SOLN: Freq: Once | INTRAVENOUS | Status: DC

## 2011-08-30 MED ORDER — SODIUM CHLORIDE 0.9 % IV SOLN
Freq: Once | INTRAVENOUS | Status: AC
  Administered 2011-08-30: 15:00:00 via INTRAVENOUS

## 2011-08-30 MED ORDER — SODIUM CHLORIDE 0.9 % IJ SOLN
10.0000 mL | INTRAMUSCULAR | Status: DC | PRN
  Filled 2011-08-30: qty 10

## 2011-08-30 MED ORDER — DEXAMETHASONE SODIUM PHOSPHATE 10 MG/ML IJ SOLN
10.0000 mg | Freq: Once | INTRAMUSCULAR | Status: AC
  Administered 2011-08-30: 10 mg via INTRAVENOUS

## 2011-08-30 MED ORDER — ONDANSETRON 8 MG/50ML IVPB (CHCC)
8.0000 mg | Freq: Once | INTRAVENOUS | Status: AC
  Administered 2011-08-30: 8 mg via INTRAVENOUS
  Filled 2011-08-30: qty 8

## 2011-08-30 MED ORDER — HEPARIN SOD (PORK) LOCK FLUSH 100 UNIT/ML IV SOLN
500.0000 [IU] | Freq: Once | INTRAVENOUS | Status: AC | PRN
  Filled 2011-08-30: qty 5

## 2011-08-31 ENCOUNTER — Ambulatory Visit (HOSPITAL_BASED_OUTPATIENT_CLINIC_OR_DEPARTMENT_OTHER): Payer: Medicare Other

## 2011-08-31 VITALS — BP 140/78 | HR 100 | Temp 97.7°F

## 2011-08-31 DIAGNOSIS — Z5111 Encounter for antineoplastic chemotherapy: Secondary | ICD-10-CM

## 2011-08-31 DIAGNOSIS — C9000 Multiple myeloma not having achieved remission: Secondary | ICD-10-CM

## 2011-08-31 DIAGNOSIS — Z452 Encounter for adjustment and management of vascular access device: Secondary | ICD-10-CM

## 2011-08-31 MED ORDER — SODIUM CHLORIDE 0.9 % IV SOLN
Freq: Once | INTRAVENOUS | Status: AC
  Administered 2011-08-31: 11:00:00 via INTRAVENOUS

## 2011-08-31 MED ORDER — HYDROCODONE-ACETAMINOPHEN 5-325 MG PO TABS
2.0000 | ORAL_TABLET | Freq: Once | ORAL | Status: AC
  Administered 2011-08-31: 2 via ORAL

## 2011-08-31 MED ORDER — PROMETHAZINE HCL 25 MG/ML IJ SOLN
12.5000 mg | Freq: Once | INTRAMUSCULAR | Status: AC
  Administered 2011-08-31: 12.5 mg via INTRAVENOUS

## 2011-08-31 MED ORDER — ONDANSETRON 8 MG/50ML IVPB (CHCC)
8.0000 mg | Freq: Once | INTRAVENOUS | Status: AC
  Administered 2011-08-31: 8 mg via INTRAVENOUS
  Filled 2011-08-31: qty 8

## 2011-08-31 MED ORDER — SODIUM CHLORIDE 0.9 % IV SOLN: Freq: Once | INTRAVENOUS | Status: DC

## 2011-08-31 MED ORDER — HEPARIN SOD (PORK) LOCK FLUSH 100 UNIT/ML IV SOLN
500.0000 [IU] | Freq: Once | INTRAVENOUS | Status: AC | PRN
  Administered 2011-08-31: 500 [IU]
  Filled 2011-08-31: qty 5

## 2011-08-31 MED ORDER — DEXAMETHASONE SODIUM PHOSPHATE 10 MG/ML IJ SOLN
10.0000 mg | Freq: Once | INTRAMUSCULAR | Status: AC
  Administered 2011-08-31: 10 mg via INTRAVENOUS

## 2011-08-31 MED ORDER — SODIUM CHLORIDE 0.9 % IJ SOLN
10.0000 mL | INTRAMUSCULAR | Status: DC | PRN
  Filled 2011-08-31: qty 10

## 2011-08-31 MED ORDER — ALTEPLASE 2 MG IJ SOLR
2.0000 mg | Freq: Once | INTRAMUSCULAR | Status: AC | PRN
  Administered 2011-08-31: 2 mg
  Filled 2011-08-31: qty 2

## 2011-08-31 MED ORDER — DEXTROSE 5 % IV SOLN
25.0000 mg/m2 | Freq: Once | INTRAVENOUS | Status: DC
  Filled 2011-08-31: qty 20

## 2011-09-01 ENCOUNTER — Ambulatory Visit: Payer: Medicare Other

## 2011-09-06 ENCOUNTER — Ambulatory Visit (HOSPITAL_BASED_OUTPATIENT_CLINIC_OR_DEPARTMENT_OTHER): Payer: Medicare Other | Admitting: Lab

## 2011-09-06 ENCOUNTER — Ambulatory Visit (HOSPITAL_BASED_OUTPATIENT_CLINIC_OR_DEPARTMENT_OTHER): Payer: Medicare Other

## 2011-09-06 ENCOUNTER — Ambulatory Visit: Payer: Medicare Other | Admitting: Physical Therapy

## 2011-09-06 DIAGNOSIS — C9000 Multiple myeloma not having achieved remission: Secondary | ICD-10-CM

## 2011-09-06 DIAGNOSIS — Z452 Encounter for adjustment and management of vascular access device: Secondary | ICD-10-CM

## 2011-09-06 DIAGNOSIS — Z5112 Encounter for antineoplastic immunotherapy: Secondary | ICD-10-CM

## 2011-09-06 LAB — CBC WITH DIFFERENTIAL (CANCER CENTER ONLY)
BASO%: 0 % (ref 0.0–2.0)
LYMPH#: 0.5 10*3/uL — ABNORMAL LOW (ref 0.9–3.3)
MONO#: 0.5 10*3/uL (ref 0.1–0.9)
Platelets: 112 10*3/uL — ABNORMAL LOW (ref 145–400)
RDW: 16.4 % — ABNORMAL HIGH (ref 11.1–15.7)
WBC: 4.9 10*3/uL (ref 3.9–10.0)

## 2011-09-06 LAB — CMP (CANCER CENTER ONLY)
ALT(SGPT): 17 U/L (ref 10–47)
AST: 18 U/L (ref 11–38)
Albumin: 3.4 g/dL (ref 3.3–5.5)
Calcium: 8.4 mg/dL (ref 8.0–10.3)
Chloride: 103 mEq/L (ref 98–108)
Potassium: 3.9 mEq/L (ref 3.3–4.7)
Sodium: 138 mEq/L (ref 128–145)

## 2011-09-06 LAB — TECHNOLOGIST REVIEW CHCC SATELLITE: Tech Review: 1

## 2011-09-06 MED ORDER — ONDANSETRON 8 MG/50ML IVPB (CHCC)
8.0000 mg | Freq: Once | INTRAVENOUS | Status: AC
  Administered 2011-09-06: 8 mg via INTRAVENOUS
  Filled 2011-09-06: qty 8

## 2011-09-06 MED ORDER — DEXAMETHASONE SODIUM PHOSPHATE 10 MG/ML IJ SOLN
10.0000 mg | Freq: Once | INTRAMUSCULAR | Status: AC
  Administered 2011-09-06: 10 mg via INTRAVENOUS

## 2011-09-06 MED ORDER — SODIUM CHLORIDE 0.9 % IV SOLN
Freq: Once | INTRAVENOUS | Status: AC
  Administered 2011-09-06: 12:00:00 via INTRAVENOUS

## 2011-09-06 MED ORDER — ALTEPLASE 2 MG IJ SOLR
2.0000 mg | Freq: Once | INTRAMUSCULAR | Status: AC | PRN
  Administered 2011-09-06: 2 mg
  Filled 2011-09-06: qty 2

## 2011-09-06 MED ORDER — DEXTROSE 5 % IV SOLN
25.0000 mg/m2 | Freq: Once | INTRAVENOUS | Status: AC
  Administered 2011-09-06: 40 mg via INTRAVENOUS
  Filled 2011-09-06: qty 20

## 2011-09-07 ENCOUNTER — Encounter: Payer: Self-pay | Admitting: Hematology & Oncology

## 2011-09-07 ENCOUNTER — Ambulatory Visit (HOSPITAL_BASED_OUTPATIENT_CLINIC_OR_DEPARTMENT_OTHER): Payer: Medicare Other

## 2011-09-07 ENCOUNTER — Other Ambulatory Visit: Payer: Self-pay | Admitting: Hematology & Oncology

## 2011-09-07 VITALS — BP 154/81 | HR 92 | Temp 97.7°F

## 2011-09-07 DIAGNOSIS — C9000 Multiple myeloma not having achieved remission: Secondary | ICD-10-CM

## 2011-09-07 DIAGNOSIS — Z452 Encounter for adjustment and management of vascular access device: Secondary | ICD-10-CM

## 2011-09-07 DIAGNOSIS — R05 Cough: Secondary | ICD-10-CM

## 2011-09-07 MED ORDER — DEXTROSE 5 % IV SOLN
25.0000 mg/m2 | Freq: Once | INTRAVENOUS | Status: DC
  Filled 2011-09-07: qty 20

## 2011-09-07 MED ORDER — SODIUM CHLORIDE 0.9 % IJ SOLN
3.0000 mL | INTRAMUSCULAR | Status: DC | PRN
  Administered 2011-09-07: 3 mL via INTRAVENOUS
  Filled 2011-09-07: qty 10

## 2011-09-07 MED ORDER — SODIUM CHLORIDE 0.9 % IV SOLN
Freq: Once | INTRAVENOUS | Status: AC
  Administered 2011-09-07: 12:00:00 via INTRAVENOUS

## 2011-09-07 MED ORDER — ONDANSETRON 8 MG/50ML IVPB (CHCC)
8.0000 mg | Freq: Once | INTRAVENOUS | Status: AC
  Administered 2011-09-07: 8 mg via INTRAVENOUS
  Filled 2011-09-07: qty 8

## 2011-09-07 MED ORDER — HYDROCODONE-HOMATROPINE 5-1.5 MG/5ML PO SYRP: 5.0000 mL | ORAL_SOLUTION | Freq: Four times a day (QID) | ORAL | Status: AC | PRN

## 2011-09-07 MED ORDER — ALTEPLASE 2 MG IJ SOLR
2.0000 mg | Freq: Once | INTRAMUSCULAR | Status: AC | PRN
  Administered 2011-09-07: 2 mg
  Filled 2011-09-07: qty 2

## 2011-09-07 MED ORDER — HEPARIN SOD (PORK) LOCK FLUSH 100 UNIT/ML IV SOLN
500.0000 [IU] | Freq: Once | INTRAVENOUS | Status: AC | PRN
  Administered 2011-09-07: 500 [IU]
  Filled 2011-09-07: qty 5

## 2011-09-07 MED ORDER — HEPARIN SOD (PORK) LOCK FLUSH 100 UNIT/ML IV SOLN
250.0000 [IU] | Freq: Once | INTRAVENOUS | Status: DC | PRN
  Filled 2011-09-07: qty 5

## 2011-09-07 MED ORDER — DEXAMETHASONE SODIUM PHOSPHATE 10 MG/ML IJ SOLN
10.0000 mg | Freq: Once | INTRAMUSCULAR | Status: AC
  Administered 2011-09-07: 10 mg via INTRAVENOUS

## 2011-09-08 ENCOUNTER — Encounter: Payer: Medicare Other | Admitting: Physical Therapy

## 2011-09-10 ENCOUNTER — Other Ambulatory Visit: Payer: Self-pay | Admitting: Hematology & Oncology

## 2011-09-13 ENCOUNTER — Other Ambulatory Visit: Payer: Medicare Other | Admitting: Lab

## 2011-09-13 ENCOUNTER — Encounter: Payer: Medicare Other | Admitting: Physical Therapy

## 2011-09-13 ENCOUNTER — Ambulatory Visit: Payer: Medicare Other

## 2011-09-13 ENCOUNTER — Ambulatory Visit: Payer: Medicare Other | Admitting: Family

## 2011-09-14 ENCOUNTER — Other Ambulatory Visit: Payer: Self-pay | Admitting: Hematology & Oncology

## 2011-09-14 ENCOUNTER — Ambulatory Visit (HOSPITAL_BASED_OUTPATIENT_CLINIC_OR_DEPARTMENT_OTHER): Payer: Medicare Other

## 2011-09-14 VITALS — BP 105/64 | HR 83 | Temp 97.8°F

## 2011-09-14 DIAGNOSIS — Z5112 Encounter for antineoplastic immunotherapy: Secondary | ICD-10-CM

## 2011-09-14 DIAGNOSIS — D631 Anemia in chronic kidney disease: Secondary | ICD-10-CM

## 2011-09-14 DIAGNOSIS — D649 Anemia, unspecified: Secondary | ICD-10-CM

## 2011-09-14 DIAGNOSIS — C9000 Multiple myeloma not having achieved remission: Secondary | ICD-10-CM

## 2011-09-14 DIAGNOSIS — N189 Chronic kidney disease, unspecified: Secondary | ICD-10-CM | POA: Insufficient documentation

## 2011-09-14 DIAGNOSIS — N289 Disorder of kidney and ureter, unspecified: Secondary | ICD-10-CM

## 2011-09-14 DIAGNOSIS — Z95828 Presence of other vascular implants and grafts: Secondary | ICD-10-CM

## 2011-09-14 DIAGNOSIS — R5381 Other malaise: Secondary | ICD-10-CM

## 2011-09-14 DIAGNOSIS — G8929 Other chronic pain: Secondary | ICD-10-CM

## 2011-09-14 DIAGNOSIS — D801 Nonfamilial hypogammaglobulinemia: Secondary | ICD-10-CM

## 2011-09-14 DIAGNOSIS — Z5111 Encounter for antineoplastic chemotherapy: Secondary | ICD-10-CM

## 2011-09-14 LAB — CMP (CANCER CENTER ONLY)
ALT(SGPT): 17 U/L (ref 10–47)
Albumin: 2.9 g/dL — ABNORMAL LOW (ref 3.3–5.5)
Alkaline Phosphatase: 37 U/L (ref 26–84)
CO2: 28 mEq/L (ref 18–33)
Glucose, Bld: 177 mg/dL — ABNORMAL HIGH (ref 73–118)
Potassium: 3.4 mEq/L (ref 3.3–4.7)
Sodium: 139 mEq/L (ref 128–145)
Total Bilirubin: 0.5 mg/dl (ref 0.20–1.60)
Total Protein: 5.7 g/dL — ABNORMAL LOW (ref 6.4–8.1)

## 2011-09-14 LAB — CBC WITH DIFFERENTIAL (CANCER CENTER ONLY)
BASO%: 0.2 % (ref 0.0–2.0)
Eosinophils Absolute: 0.1 10*3/uL (ref 0.0–0.5)
LYMPH%: 8.4 % — ABNORMAL LOW (ref 14.0–48.0)
MONO#: 0.5 10*3/uL (ref 0.1–0.9)
NEUT#: 4.2 10*3/uL (ref 1.5–6.5)
Platelets: 117 10*3/uL — ABNORMAL LOW (ref 145–400)
RBC: 2.49 10*6/uL — ABNORMAL LOW (ref 3.70–5.32)
RDW: 16.4 % — ABNORMAL HIGH (ref 11.1–15.7)
WBC: 5.2 10*3/uL (ref 3.9–10.0)

## 2011-09-14 MED ORDER — SODIUM CHLORIDE 0.9 % IV SOLN: Freq: Once | INTRAVENOUS | Status: DC

## 2011-09-14 MED ORDER — DARBEPOETIN ALFA-POLYSORBATE 300 MCG/0.6ML IJ SOLN
300.0000 ug | Freq: Once | INTRAMUSCULAR | Status: AC
  Administered 2011-09-14: 300 ug via SUBCUTANEOUS

## 2011-09-14 MED ORDER — SODIUM CHLORIDE 0.9 % IV SOLN
Freq: Once | INTRAVENOUS | Status: AC
  Administered 2011-09-14: 12:00:00 via INTRAVENOUS

## 2011-09-14 MED ORDER — SODIUM CHLORIDE 0.9 % IV SOLN
Freq: Once | INTRAVENOUS | Status: AC
  Administered 2011-09-14: 16:00:00 via INTRAVENOUS

## 2011-09-14 MED ORDER — ALTEPLASE 2 MG IJ SOLR
2.0000 mg | Freq: Once | INTRAMUSCULAR | Status: AC | PRN
  Administered 2011-09-14: 2 mg
  Filled 2011-09-14: qty 2

## 2011-09-14 MED ORDER — DEXAMETHASONE SODIUM PHOSPHATE 10 MG/ML IJ SOLN
10.0000 mg | Freq: Once | INTRAMUSCULAR | Status: AC
  Administered 2011-09-14: 10 mg via INTRAVENOUS

## 2011-09-14 MED ORDER — DIPHENHYDRAMINE HCL 25 MG PO TABS
25.0000 mg | ORAL_TABLET | Freq: Once | ORAL | Status: AC
  Administered 2011-09-14: 25 mg via ORAL
  Filled 2011-09-14: qty 1

## 2011-09-14 MED ORDER — ACETAMINOPHEN 325 MG PO TABS
650.0000 mg | ORAL_TABLET | Freq: Four times a day (QID) | ORAL | Status: DC | PRN
  Administered 2011-09-14: 650 mg via ORAL

## 2011-09-14 MED ORDER — DEXTROSE 5 % IV SOLN
25.0000 mg/m2 | Freq: Once | INTRAVENOUS | Status: AC
  Administered 2011-09-14: 40 mg via INTRAVENOUS
  Filled 2011-09-14: qty 20

## 2011-09-14 MED ORDER — ONDANSETRON 8 MG/50ML IVPB (CHCC)
8.0000 mg | Freq: Once | INTRAVENOUS | Status: AC
  Administered 2011-09-14: 8 mg via INTRAVENOUS
  Filled 2011-09-14: qty 8

## 2011-09-14 MED ORDER — HEPARIN SOD (PORK) LOCK FLUSH 100 UNIT/ML IV SOLN
500.0000 [IU] | Freq: Once | INTRAVENOUS | Status: AC | PRN
  Administered 2011-09-14: 500 [IU]
  Filled 2011-09-14: qty 5

## 2011-09-14 MED ORDER — SODIUM CHLORIDE 0.9 % IJ SOLN
10.0000 mL | INTRAMUSCULAR | Status: DC | PRN
  Administered 2011-09-14: 10 mL
  Filled 2011-09-14: qty 10

## 2011-09-14 MED ORDER — ALTEPLASE 2 MG IJ SOLR
2.0000 mg | Freq: Once | INTRAMUSCULAR | Status: AC
  Administered 2011-09-14: 2 mg
  Filled 2011-09-14: qty 2

## 2011-09-14 MED ORDER — IMMUNE GLOBULIN (HUMAN) 20 GM/200ML IV SOLN
50.0000 g | Freq: Once | INTRAVENOUS | Status: AC
  Administered 2011-09-14: 50 g via INTRAVENOUS
  Filled 2011-09-14: qty 500

## 2011-09-14 NOTE — Patient Instructions (Addendum)
[facility] Discharge Instructions for Patients Receiving Chemotherapy  Today you received the following chemotherapy agents Kyprolis  To help prevent nausea and vomiting after your treatment, we encourage you to take your nausea medication as directed.    If you develop nausea and vomiting that is not controlled by your nausea medication, call the clinic. If it is after clinic hours your family physician or the after hours number for the clinic or go to the Emergency Department.   BELOW ARE SYMPTOMS THAT SHOULD BE REPORTED IMMEDIATELY:  *FEVER GREATER THAN 100.5 F  *CHILLS WITH OR WITHOUT FEVER  NAUSEA AND VOMITING THAT IS NOT CONTROLLED WITH YOUR NAUSEA MEDICATION  *UNUSUAL SHORTNESS OF BREATH  *UNUSUAL BRUISING OR BLEEDING  TENDERNESS IN MOUTH AND THROAT WITH OR WITHOUT PRESENCE OF ULCERS  *URINARY PROBLEMS  *BOWEL PROBLEMS  UNUSUAL RASH Items with * indicate a potential emergency and should be followed up as soon as possible.   The clinic phone number is [phone].   I have been informed and understand all the instructions given to me. I know to contact the clinic, my physician, or go to the Emergency Department if any problems should occur. I do not have any questions at this time, but understand that I may call the clinic during office hours   should I have any questions or need assistance in obtaining follow up care.   [name] 09/14/2011 6:00 PM    __________________________________________  _____________  __________ Signature of Patient or Authorized Representative            Date                   Time   Waylan Rocher  __________________________________________ Nurse's Signature     November 2012  Sunday Monday Tuesday Wednesday Thursday Friday Saturday              1   2   3            4   5   TREATMENT-ORTHO   4:15 PM  (45 min.)  Elke Naumann-Houegnifio, PT  [facility] 6   7   TREATMENT-ORTHO   4:15 PM  (45 min.)  Elke Naumann-Houegnifio, PT  [facility] 8   9   10            11   12   LAB   1:00 PM  (15 min.)  Gwendolyn A. Thomas  [facility] [facility]   INFUSION 1H 30M   1:15 PM  (90 min.)  [facility] 7  [place] [facility] 13   INFUSION 1H 30M  10:45 AM  (90 min.)  [facility] 3  [place]  [facility] 14   TREATMENT-ORTHO   3:30 PM  (45 min.)  Kelly Takacs, PT  [facility] 15   16   17     Cycle 1, Day 1 Cycle 1, Day 2      18    19   LAB  10:30 AM  (15 min.)  Gwendolyn A. [facility]   INFUSION 1H 95M  11:00 AM  (90 min.)  [facility] 1  [place] [facility]   TREATMENT-ORTHO   4:15 PM  (45 min.)  Elke Naumann-Houegnifio, PT  [facility] 20   INFUSION 1H 95M  10:00  AM  (90 min.)  [facility] 2  [place] [facility] 21   22   23   24      Cycle 1, Day 8 Cycle 1, Day 9      25   26    LAB  10:45 AM  (15 min.)  Gwendolyn A. [facility]   EST PT 30  11:15 AM  (30 min.)  Colman Cater, FNP  [place] [facility]   INFUSION 1H 38M  12:00 PM  (90 min.)  [facility] 4  [place] [facility] 27   INFUSION 1H 38M  10:00 AM  (90 min.)  [facility] 3  [place] [facility] 28   TREATMENT-ORTHO   4:15 PM  (45 min.)  Elke Naumann-Houegnifio, PT  [facility] 29   30        Cycle 1, Day 15  Add'l treatments Cycle 1, Day 16       Treatment Details  08/30/2011 - Cycle 1, Day 1     Other: SCHEDULING COMMUNICATION     Nursing Communication: TREATMENT CONDITIONS     Hydration: sodium chloride      Nursing Orders: sodium chloride, heparin lock flush, heparin lock flush, alteplase (CATHFLO ACTIVASE), sodium chloride     Hydration: sodium chloride     Pre-Medications: ONDANSETRON IVPB ORDERABLE ([facility]), dexamethasone (DECADRON)     Chemotherapy: CARFILZOMIB CHEMO IV INFUSION     Post-Medications: sodium chloride     Emergency Medications: diphenhydrAMINE (BENADRYL), sodium chloride, methylPREDNISolone sodium succinate (SOLU-MEDROL), EPINEPHrine (ADRENALIN), EPINEPHrine (ADRENALIN), diphenhydrAMINE (BENADRYL), albuterol (PROVENTIL)  08/31/2011 - Cycle 1, Day 2     Other: SCHEDULING COMMUNICATION     Hydration: sodium chloride     Nursing Orders: sodium chloride, heparin lock flush, heparin lock flush, alteplase (CATHFLO ACTIVASE), sodium chloride     Hydration: sodium chloride     Pre-Medications: ONDANSETRON IVPB ORDERABLE ([facility]), dexamethasone (DECADRON)     Chemotherapy: CARFILZOMIB CHEMO IV INFUSION     Post-Medications: sodium chloride     Emergency Medications: diphenhydrAMINE (BENADRYL), sodium chloride, methylPREDNISolone sodium succinate (SOLU-MEDROL), EPINEPHrine (ADRENALIN), EPINEPHrine (ADRENALIN), diphenhydrAMINE (BENADRYL), albuterol (PROVENTIL)  09/06/2011 - Cycle 1, Day 8     Other: SCHEDULING COMMUNICATION     Nursing Communication: TREATMENT CONDITIONS     Hydration: sodium chloride     Nursing Orders: sodium chloride, heparin lock flush, heparin lock flush, alteplase (CATHFLO ACTIVASE), sodium chloride     Hydration: sodium chloride     Pre-Medications: ONDANSETRON IVPB ORDERABLE ([facility]), dexamethasone (DECADRON)     Chemotherapy: CARFILZOMIB CHEMO IV INFUSION     Post-Medications: sodium chloride     Emergency Medications: diphenhydrAMINE (BENADRYL), sodium chloride, methylPREDNISolone sodium succinate (SOLU-MEDROL), EPINEPHrine (ADRENALIN), EPINEPHrine (ADRENALIN), diphenhydrAMINE (BENADRYL), albuterol (PROVENTIL)  09/07/2011 - Cycle 1, Day 9     Other: SCHEDULING  COMMUNICATION     Hydration: sodium chloride     Nursing Orders: sodium chloride, heparin lock flush, heparin lock flush, alteplase (CATHFLO ACTIVASE), sodium chloride     Hydration: sodium chloride     Pre-Medications: ONDANSETRON IVPB ORDERABLE ([facility]), dexamethasone (DECADRON)     Chemotherapy: CARFILZOMIB CHEMO IV INFUSION     Post-Medications: sodium chloride     Emergency Medications: diphenhydrAMINE (BENADRYL), sodium chloride, methylPREDNISolone sodium succinate (SOLU-MEDROL), EPINEPHrine (ADRENALIN), EPINEPHrine (ADRENALIN), diphenhydrAMINE (BENADRYL), albuterol (PROVENTIL)  09/13/2011 - Cycle 1, Day 15  Other: SCHEDULING COMMUNICATION     Nursing Communication: TREATMENT CONDITIONS     Hydration: sodium chloride     Nursing Orders: sodium chloride, heparin lock flush, heparin lock flush, alteplase (CATHFLO ACTIVASE), sodium chloride     Hydration: sodium chloride     Pre-Medications: ONDANSETRON IVPB ORDERABLE ([facility]), dexamethasone (DECADRON)     Chemotherapy: CARFILZOMIB CHEMO IV INFUSION     Post-Medications: sodium chloride     Emergency Medications: diphenhydrAMINE (BENADRYL), sodium chloride, methylPREDNISolone sodium succinate (SOLU-MEDROL), EPINEPHrine (ADRENALIN), EPINEPHrine (ADRENALIN), diphenhydrAMINE (BENADRYL), albuterol (PROVENTIL)  09/14/2011 - Additional treatments     Nursing Communication: SCHEDULING COMMUNICATION     Pre-Medications: sodium chloride, heparin lock flush, heparin lock flush, alteplase (CATHFLO ACTIVASE), sodium chloride     Hydration: sodium chloride     Supportive Care: acetaminophen (TYLENOL), diphenhydrAMINE (BENADRYL), IMMUNE GLOBULIN IV ORDERABLE     Other: IMMUNE GLOBULIN IV ORDERABLE     Supportive Care: darbepoetin (ARANESP)     Nursing Communication: darbepoetin (ARANESP)  09/15/2011 - Cycle 1, Day 16     Other: SCHEDULING COMMUNICATION     Hydration: sodium chloride     Nursing Orders: sodium chloride, heparin lock flush, heparin  lock flush, alteplase (CATHFLO ACTIVASE), sodium chloride     Hydration: sodium chloride     Pre-Medications: ONDANSETRON IVPB ORDERABLE ([facility]), dexamethasone (DECADRON)     Chemotherapy: CARFILZOMIB CHEMO IV INFUSION     Post-Medications: sodium chloride     Emergency Medications: diphenhydrAMINE (BENADRYL), sodium chloride, methylPREDNISolone sodium succinate (SOLU-MEDROL), EPINEPHrine (ADRENALIN), EPINEPHrine (ADRENALIN), diphenhydrAMINE (BENADRYL), albuterol (PROVENTIL)     December 2012  Sunday Monday Tuesday Wednesday Thursday Friday Saturday                    1            2   3   LAB  11:00 AM  (15 min.)  Gwendolyn A. Thomas  [place] [facility]   INFUSION 1H 30M  11:30 AM  (90 min.)  [facility] 2  [place] [facility] 4   INFUSION 1H 30M  11:00 AM  (90 min.)  [facility] 4  [place] [facility] 5   6   7   8            9   10   LAB   9:45 AM  (15 min.)  Gwendolyn A. Thomas  [place] [facility]   EST PT 30  10:15 AM  (30 min.)  Nancy Rudolph, FNP  [place] [facility]   INFUSION 1H 30M  11:00 AM  (90 min.)  [facility] 4  [facility] 11   INFUSION 1H 30M  10:00 AM  (90 min.)  [facility] 3  [place] [facility] 12   13   14   15     Cycle 2, Day 1 Cycle 2, Day 2      16   17   18   19   20   21   22     Cycle 2, Day 8 Cycle 2, Day 9      23    24   25   26   27   28   29      Cycle 2, Day 15  Cycle 2, Day 16  30   31                             Treatment Details  09/27/2011 - Cycle 2, Day 1     Physician Communication: PHYSICIAN COMMUNICATION ORDER     Other: SCHEDULING COMMUNICATION     Nursing Communication: TREATMENT CONDITIONS     Hydration: sodium chloride     Nursing Orders: sodium chloride, heparin lock  flush, heparin lock flush, alteplase (CATHFLO ACTIVASE), sodium chloride     Hydration: sodium chloride     Pre-Medications: ONDANSETRON IVPB ORDERABLE ([facility]), dexamethasone (DECADRON)     Chemotherapy: CARFILZOMIB CHEMO IV INFUSION     Post-Medications: sodium chloride     Emergency Medications: diphenhydrAMINE (BENADRYL), sodium chloride, methylPREDNISolone sodium succinate (SOLU-MEDROL), EPINEPHrine (ADRENALIN), EPINEPHrine (ADRENALIN), diphenhydrAMINE (BENADRYL), albuterol (PROVENTIL)  09/28/2011 - Cycle 2, Day 2     Other: SCHEDULING COMMUNICATION     Hydration: sodium chloride     Nursing Orders: sodium chloride, heparin lock flush, heparin lock flush, alteplase (CATHFLO ACTIVASE), sodium chloride     Hydration: sodium chloride     Pre-Medications: ONDANSETRON IVPB ORDERABLE ([facility]), dexamethasone (DECADRON)     Chemotherapy: CARFILZOMIB CHEMO IV INFUSION     Post-Medications: sodium chloride     Emergency Medications: diphenhydrAMINE (BENADRYL), sodium chloride, methylPREDNISolone sodium succinate (SOLU-MEDROL), EPINEPHrine (ADRENALIN), EPINEPHrine (ADRENALIN), diphenhydrAMINE (BENADRYL), albuterol (PROVENTIL)  10/04/2011 - Cycle 2, Day 8     Other: SCHEDULING COMMUNICATION     Nursing Communication: TREATMENT CONDITIONS     Hydration: sodium chloride     Nursing Orders: sodium chloride, heparin lock flush, heparin lock flush, alteplase (CATHFLO ACTIVASE), sodium chloride     Hydration: sodium chloride     Pre-Medications: ONDANSETRON IVPB ORDERABLE ([facility]), dexamethasone (DECADRON)     Chemotherapy: CARFILZOMIB CHEMO IV INFUSION     Post-Medications: sodium chloride     Emergency Medications: diphenhydrAMINE (BENADRYL), sodium chloride, methylPREDNISolone sodium succinate (SOLU-MEDROL), EPINEPHrine (ADRENALIN), EPINEPHrine (ADRENALIN), diphenhydrAMINE (BENADRYL), albuterol (PROVENTIL)  10/05/2011 - Cycle 2, Day 9     Other: SCHEDULING COMMUNICATION     Hydration: sodium chloride      Nursing Orders: sodium chloride, heparin lock flush, heparin lock flush, alteplase (CATHFLO ACTIVASE), sodium chloride     Hydration: sodium chloride     Pre-Medications: ONDANSETRON IVPB ORDERABLE ([facility]), dexamethasone (DECADRON)     Chemotherapy: CARFILZOMIB CHEMO IV INFUSION     Post-Medications: sodium chloride     Emergency Medications: diphenhydrAMINE (BENADRYL), sodium chloride, methylPREDNISolone sodium succinate (SOLU-MEDROL), EPINEPHrine (ADRENALIN), EPINEPHrine (ADRENALIN), diphenhydrAMINE (BENADRYL), albuterol (PROVENTIL)  10/11/2011 - Cycle 2, Day 15     Other: SCHEDULING COMMUNICATION     Nursing Communication: TREATMENT CONDITIONS     Hydration: sodium chloride     Nursing Orders: sodium chloride, heparin lock flush, heparin lock flush, alteplase (CATHFLO ACTIVASE), sodium chloride     Hydration: sodium chloride     Pre-Medications: ONDANSETRON IVPB ORDERABLE ([facility]), dexamethasone (DECADRON)     Chemotherapy: CARFILZOMIB CHEMO IV INFUSION     Post-Medications: sodium chloride     Emergency Medications: diphenhydrAMINE (BENADRYL), sodium chloride, methylPREDNISolone sodium succinate (SOLU-MEDROL), EPINEPHrine (ADRENALIN), EPINEPHrine (ADRENALIN), diphenhydrAMINE (BENADRYL), albuterol (PROVENTIL)  10/13/2011 - Cycle 2, Day 16     Other: SCHEDULING COMMUNICATION     Hydration: sodium chloride     Nursing Orders: sodium chloride, heparin lock flush, heparin lock flush, alteplase (CATHFLO ACTIVASE), sodium chloride     Hydration: sodium chloride  Pre-Medications: ONDANSETRON IVPB ORDERABLE ([facility]), dexamethasone (DECADRON)     Chemotherapy: CARFILZOMIB CHEMO IV INFUSION     Post-Medications: sodium chloride     Emergency Medications: diphenhydrAMINE (BENADRYL), sodium chloride, methylPREDNISolone sodium succinate (SOLU-MEDROL), EPINEPHrine (ADRENALIN), EPINEPHrine (ADRENALIN), diphenhydrAMINE (BENADRYL), albuterol (PROVENTIL)    Results for orders placed in visit on  09/14/11  CBC WITH DIFFERENTIAL ([facility])      Component Value Range   WBC 5.2  3.9 - 10.0 (10e3/uL)   RBC 2.49 (*) 3.70 - 5.32 (10e6/uL)   HGB 8.7 (*) 11.6 - 15.9 (g/dL)   HCT 21.3 (*) 08.6 - 46.6 (%)   MCV 106 (*) 81 - 101 (fL)   MCH 34.9 (*) 26.0 - 34.0 (pg)   MCHC 32.8  32.0 - 36.0 (g/dL)   RDW 57.8 (*) 46.9 - 15.7 (%)   Platelets 117 (*) 145 - 400 (10e3/uL)   NEUT# 4.2  1.5 - 6.5 (10e3/uL)   LYMPH# 0.4 (*) 0.9 - 3.3 (10e3/uL)   MONO# 0.5  0.1 - 0.9 (10e3/uL)   Eosinophils Absolute 0.1  0.0 - 0.5 (10e3/uL)   BASO# 0.0  0.0 - 0.2 (10e3/uL)   NEUT% 80.5 (*) 39.6 - 80.0 (%)   LYMPH% 8.4 (*) 14.0 - 48.0 (%)   MONO% 9.0  0.0 - 13.0 (%)   EOS% 1.9  0.0 - 7.0 (%)   BASO% 0.2  0.0 - 2.0 (%)  COMPREHENSIVE METABOLIC PANEL      Component Value Range   Sodium 139  128 - 145 (mEq/L)   Potassium 3.4  3.3 - 4.7 (mEq/L)   Chloride 105  98 - 108 (mEq/L)   CO2 28  18 - 33 (mEq/L)   Glucose, Bld 177 (*) 73 - 118 (mg/dL)   BUN, Bld 12  7 - 22 (mg/dL)   Creat 0.7  0.6 - 1.2 (mg/dl)   Total Bilirubin 6.29  0.20 - 1.60 (mg/dl)   Alkaline Phosphatase 37  26 - 84 (U/L)   AST 16  11 - 38 (U/L)   ALT(SGPT) 17  10 - 47 (U/L)   Total Protein 5.7 (*) 6.4 - 8.1 (g/dL)   Albumin 2.9 (*) 3.3 - 5.5 (g/dL)   Calcium 8.3  8.0 - 52.8 (mg/dL)  LACTATE DEHYDROGENASE      Component Value Range   LD 193  94 - 250 (U/L)   Current outpatient prescriptions:acyclovir (ZOVIRAX) 400 MG tablet, Take 400 mg by mouth every 4 (four) hours while awake.  , Disp: , Rfl: ;  celecoxib (CELEBREX) 100 MG capsule, Take 100 mg by mouth 2 (two) times daily.  , Disp: , Rfl: ;  diazepam (VALIUM) 5 MG tablet, Take 5 mg by mouth every 6 (six) hours as needed. , Disp: , Rfl:  diphenoxylate-atropine (LOMOTIL) 2.5-0.025 MG per tablet, Take 1 tablet by mouth 4 (four) times daily as needed. , Disp: , Rfl: ;  DULoxetine (CYMBALTA) 30 MG capsule, Take 30 mg by mouth daily.  , Disp: , Rfl: ;  esomeprazole (NEXIUM) 40 MG capsule, Take  40 mg by mouth daily before breakfast.  , Disp: , Rfl: ;  fluticasone-salmeterol (ADVAIR HFA) 230-21 MCG/ACT inhaler, Inhale 2 puffs into the lungs 2 (two) times daily.  , Disp: , Rfl:  HYDROcodone-acetaminophen (LORTAB) 10-500 MG per tablet, Take 1 tablet by mouth every 6 (six) hours as needed.  , Disp: , Rfl: ;  HYDROcodone-homatropine (HYCODAN) 5-1.5 MG/5ML syrup, Take 5 mLs by mouth every 6 (six) hours as needed for  cough., Disp: 180 mL, Rfl: 2;  levothyroxine (SYNTHROID, LEVOTHROID) 50 MCG tablet, Take 50 mcg by mouth daily.  , Disp: , Rfl:  LORazepam (ATIVAN) 1 MG tablet, Take 1 mg by mouth every 8 (eight) hours. , Disp: , Rfl: ;  montelukast (SINGULAIR) 10 MG tablet, Take 10 mg by mouth at bedtime.  , Disp: , Rfl: ;  olopatadine (PATANOL) 0.1 % ophthalmic solution, 1 drop 2 (two) times daily.  , Disp: , Rfl: ;  pirbuterol (MAXAIR) 200 MCG/INH inhaler, Inhale 2 puffs into the lungs 4 (four) times daily.  , Disp: , Rfl:  potassium chloride (KLOR-CON) 10 MEQ CR tablet, Take 10 mEq by mouth daily.  , Disp: , Rfl: ;  tolterodine (DETROL LA) 4 MG 24 hr capsule, Take 4 mg by mouth daily.  , Disp: , Rfl: ;  torsemide (DEMADEX) 20 MG tablet, Take 20 mg by mouth daily.  , Disp: , Rfl: ;  Vitamin D, Ergocalciferol, (DRISDOL) 50000 UNITS CAPS, Take 50,000 Units by mouth every 7 (seven) days. Take on Monday , Disp: , Rfl:  zolpidem (AMBIEN CR) 12.5 MG CR tablet, Take 12.5 mg by mouth at bedtime as needed.  , Disp: , Rfl:  Current facility-administered medications:0.9 %  sodium chloride infusion, , Intravenous, Once, Josph Macho, MD;  0.9 %  sodium chloride infusion, , Intravenous, Once, Josph Macho, MD;  0.9 %  sodium chloride infusion, , Intravenous, Once, Josph Macho, MD;  acetaminophen (TYLENOL) tablet 650 mg, 650 mg, Oral, Q6H PRN, Josph Macho, MD, 650 mg at 09/14/11 1306 alteplase (CATHFLO ACTIVASE) injection 2 mg, 2 mg, Intracatheter, Once PRN, Josph Macho, MD, 2 mg at 09/14/11 1030;   alteplase (CATHFLO ACTIVASE) injection 2 mg, 2 mg, Intracatheter, Once, Colman Cater, FNP, 2 mg at 09/14/11 1030;  carfilzomib (KYPROLIS) 40 mg in dextrose 5 % 50 mL chemo infusion, 25 mg/m2 (Treatment Plan Actual), Intravenous, Once, Josph Macho, MD, 40 mg at 09/14/11 1609 darbepoetin (ARANESP) injection 300 mcg, 300 mcg, Subcutaneous, Once, Colman Cater, FNP, 300 mcg at 09/14/11 1505;  dexamethasone (DECADRON) injection 10 mg, 10 mg, Intravenous, Once, Josph Macho, MD, 10 mg at 09/14/11 1545;  diphenhydrAMINE (BENADRYL) tablet 25 mg, 25 mg, Oral, Once, Josph Macho, MD, 25 mg at 09/14/11 1306;  heparin lock flush 100 unit/mL, 500 Units, Intracatheter, Once PRN, Josph Macho, MD, 500 Units at 09/14/11 1711 Immune Globulin (Human) SOLN 50 g, 50 g, Intravenous, Once, Josph Macho, MD, 50 g at 09/14/11 1502;  ondansetron (ZOFRAN) IVPB 8 mg, 8 mg, Intravenous, Once, Josph Macho, MD, 8 mg at 09/14/11 1545;  sodium chloride 0.9 % injection 10 mL, 10 mL, Intracatheter, PRN, Josph Macho, MD, 10 mL at 09/14/11 1711 Facility-Administered Medications Ordered in Other Visits: 0.9 %  sodium chloride infusion, , Intravenous, Once, Josph Macho, MD;  0.9 %  sodium chloride infusion, , Intravenous, Once, Josph Macho, MD;  carfilzomib (KYPROLIS) 40 mg in dextrose 5 % 50 mL chemo infusion, 25 mg/m2 (Treatment Plan Actual), Intravenous, Once, Josph Macho, MD;  sodium chloride 0.9 % injection 10 mL, 10 mL, Intracatheter, PRN, Josph Macho, MD sodium chloride 0.9 % injection 10 mL, 10 mL, Intracatheter, PRN, Josph Macho, MD

## 2011-09-15 ENCOUNTER — Encounter: Payer: Medicare Other | Admitting: Physical Therapy

## 2011-09-15 ENCOUNTER — Ambulatory Visit (HOSPITAL_BASED_OUTPATIENT_CLINIC_OR_DEPARTMENT_OTHER): Payer: Medicare Other

## 2011-09-15 VITALS — BP 142/92 | HR 88 | Temp 97.1°F

## 2011-09-15 DIAGNOSIS — Z5112 Encounter for antineoplastic immunotherapy: Secondary | ICD-10-CM

## 2011-09-15 DIAGNOSIS — C9000 Multiple myeloma not having achieved remission: Secondary | ICD-10-CM

## 2011-09-15 MED ORDER — SODIUM CHLORIDE 0.9 % IV SOLN
Freq: Once | INTRAVENOUS | Status: AC
  Administered 2011-09-15: 13:00:00 via INTRAVENOUS

## 2011-09-15 MED ORDER — LORAZEPAM 2 MG/ML IJ SOLN
0.5000 mg | Freq: Once | INTRAMUSCULAR | Status: AC
  Administered 2011-09-15: 0.5 mg via INTRAVENOUS

## 2011-09-15 MED ORDER — SODIUM CHLORIDE 0.9 % IJ SOLN
10.0000 mL | INTRAMUSCULAR | Status: DC | PRN
  Administered 2011-09-15: 10 mL
  Filled 2011-09-15: qty 10

## 2011-09-15 MED ORDER — ONDANSETRON 8 MG/50ML IVPB (CHCC)
8.0000 mg | Freq: Once | INTRAVENOUS | Status: AC
  Administered 2011-09-15: 8 mg via INTRAVENOUS
  Filled 2011-09-15: qty 8

## 2011-09-15 MED ORDER — DEXTROSE 5 % IV SOLN
25.0000 mg/m2 | Freq: Once | INTRAVENOUS | Status: AC
  Administered 2011-09-15: 40 mg via INTRAVENOUS
  Filled 2011-09-15: qty 20

## 2011-09-15 MED ORDER — SODIUM CHLORIDE 0.9 % IV SOLN: Freq: Once | INTRAVENOUS | Status: DC

## 2011-09-15 MED ORDER — HEPARIN SOD (PORK) LOCK FLUSH 100 UNIT/ML IV SOLN
500.0000 [IU] | Freq: Once | INTRAVENOUS | Status: AC | PRN
  Administered 2011-09-15: 500 [IU]
  Filled 2011-09-15: qty 5

## 2011-09-15 MED ORDER — SODIUM CHLORIDE 0.9 % IV SOLN
Freq: Once | INTRAVENOUS | Status: AC
  Administered 2011-09-15: 12:00:00 via INTRAVENOUS

## 2011-09-15 MED ORDER — DEXAMETHASONE SODIUM PHOSPHATE 10 MG/ML IJ SOLN
10.0000 mg | Freq: Once | INTRAMUSCULAR | Status: AC
  Administered 2011-09-15: 10 mg via INTRAVENOUS

## 2011-09-16 ENCOUNTER — Ambulatory Visit: Payer: Medicare Other | Admitting: Physical Therapy

## 2011-09-16 LAB — SPEP & IFE WITH QIG
Albumin ELP: 61.6 % (ref 55.8–66.1)
Alpha-1-Globulin: 6 % — ABNORMAL HIGH (ref 2.9–4.9)
Beta 2: 5.4 % (ref 3.2–6.5)
Beta Globulin: 5.9 % (ref 4.7–7.2)
Total Protein, Serum Electrophoresis: 5.3 g/dL — ABNORMAL LOW (ref 6.0–8.3)

## 2011-09-16 LAB — KAPPA/LAMBDA LIGHT CHAINS
Kappa free light chain: 0.66 mg/dL (ref 0.33–1.94)
Lambda Free Lght Chn: 0.03 mg/dL — ABNORMAL LOW (ref 0.57–2.63)

## 2011-09-16 LAB — LACTATE DEHYDROGENASE: LDH: 193 U/L (ref 94–250)

## 2011-09-20 ENCOUNTER — Other Ambulatory Visit: Payer: Self-pay | Admitting: Hematology & Oncology

## 2011-09-20 ENCOUNTER — Telehealth: Payer: Self-pay | Admitting: *Deleted

## 2011-09-20 ENCOUNTER — Other Ambulatory Visit: Payer: Medicare Other | Admitting: Lab

## 2011-09-20 ENCOUNTER — Ambulatory Visit: Payer: Medicare Other

## 2011-09-20 NOTE — Telephone Encounter (Signed)
Pt cancelled 12-3 is having diarrhea will make 12-4 and 5 appointments. RN aware

## 2011-09-21 ENCOUNTER — Other Ambulatory Visit: Payer: Medicare Other | Admitting: Lab

## 2011-09-21 ENCOUNTER — Ambulatory Visit: Payer: Medicare Other

## 2011-09-22 ENCOUNTER — Ambulatory Visit: Payer: Medicare Other

## 2011-09-22 ENCOUNTER — Encounter: Payer: Self-pay | Admitting: Hematology & Oncology

## 2011-09-22 NOTE — Progress Notes (Signed)
Faxed Prescription and Letter of Medical Necessity form [facility] [phone]

## 2011-09-23 ENCOUNTER — Ambulatory Visit: Payer: Medicare Other | Attending: Hematology & Oncology | Admitting: Physical Therapy

## 2011-09-23 DIAGNOSIS — R5381 Other malaise: Secondary | ICD-10-CM | POA: Insufficient documentation

## 2011-09-23 DIAGNOSIS — M6281 Muscle weakness (generalized): Secondary | ICD-10-CM | POA: Insufficient documentation

## 2011-09-23 DIAGNOSIS — IMO0001 Reserved for inherently not codable concepts without codable children: Secondary | ICD-10-CM | POA: Insufficient documentation

## 2011-09-27 ENCOUNTER — Other Ambulatory Visit: Payer: Self-pay | Admitting: Hematology & Oncology

## 2011-09-27 ENCOUNTER — Encounter: Payer: Self-pay | Admitting: Family

## 2011-09-27 ENCOUNTER — Ambulatory Visit (HOSPITAL_BASED_OUTPATIENT_CLINIC_OR_DEPARTMENT_OTHER): Payer: Medicare Other

## 2011-09-27 ENCOUNTER — Other Ambulatory Visit (HOSPITAL_BASED_OUTPATIENT_CLINIC_OR_DEPARTMENT_OTHER): Payer: Medicare Other | Admitting: Lab

## 2011-09-27 ENCOUNTER — Ambulatory Visit (HOSPITAL_BASED_OUTPATIENT_CLINIC_OR_DEPARTMENT_OTHER): Payer: Medicare Other | Admitting: Family

## 2011-09-27 VITALS — BP 130/70 | HR 105 | Temp 98.6°F | Ht 59.0 in | Wt 142.3 lb

## 2011-09-27 DIAGNOSIS — C9000 Multiple myeloma not having achieved remission: Secondary | ICD-10-CM

## 2011-09-27 DIAGNOSIS — Z452 Encounter for adjustment and management of vascular access device: Secondary | ICD-10-CM

## 2011-09-27 DIAGNOSIS — C9002 Multiple myeloma in relapse: Secondary | ICD-10-CM

## 2011-09-27 DIAGNOSIS — N289 Disorder of kidney and ureter, unspecified: Secondary | ICD-10-CM

## 2011-09-27 DIAGNOSIS — Z5112 Encounter for antineoplastic immunotherapy: Secondary | ICD-10-CM

## 2011-09-27 DIAGNOSIS — D801 Nonfamilial hypogammaglobulinemia: Secondary | ICD-10-CM

## 2011-09-27 DIAGNOSIS — R5383 Other fatigue: Secondary | ICD-10-CM

## 2011-09-27 DIAGNOSIS — D649 Anemia, unspecified: Secondary | ICD-10-CM

## 2011-09-27 DIAGNOSIS — R197 Diarrhea, unspecified: Secondary | ICD-10-CM

## 2011-09-27 LAB — CBC WITH DIFFERENTIAL (CANCER CENTER ONLY)
BASO#: 0 10*3/uL (ref 0.0–0.2)
EOS%: 2.2 % (ref 0.0–7.0)
Eosinophils Absolute: 0.1 10*3/uL (ref 0.0–0.5)
HCT: 31 % — ABNORMAL LOW (ref 34.8–46.6)
HGB: 9.8 g/dL — ABNORMAL LOW (ref 11.6–15.9)
LYMPH#: 0.7 10*3/uL — ABNORMAL LOW (ref 0.9–3.3)
MCH: 34.9 pg — ABNORMAL HIGH (ref 26.0–34.0)
MCHC: 31.6 g/dL — ABNORMAL LOW (ref 32.0–36.0)
NEUT%: 74.6 % (ref 39.6–80.0)
RBC: 2.81 10*6/uL — ABNORMAL LOW (ref 3.70–5.32)

## 2011-09-27 LAB — COMPREHENSIVE METABOLIC PANEL
ALT: 12 U/L (ref 0–35)
AST: 17 U/L (ref 0–37)
BUN: 19 mg/dL (ref 6–23)
CO2: 26 mEq/L (ref 19–32)
Calcium: 9.3 mg/dL (ref 8.4–10.5)
Chloride: 103 mEq/L (ref 96–112)
Creatinine, Ser: 1.21 mg/dL — ABNORMAL HIGH (ref 0.50–1.10)
Total Bilirubin: 0.5 mg/dL (ref 0.3–1.2)

## 2011-09-27 LAB — FERRITIN: Ferritin: 1993 ng/mL — ABNORMAL HIGH (ref 10–291)

## 2011-09-27 MED ORDER — CARFILZOMIB CHEMO INJECTION 60 MG
25.0000 mg/m2 | Freq: Once | INTRAVENOUS | Status: AC
  Administered 2011-09-27: 40 mg via INTRAVENOUS
  Filled 2011-09-27: qty 20

## 2011-09-27 MED ORDER — ZOLEDRONIC ACID 4 MG/100ML IV SOLN
4.0000 mg | Freq: Once | INTRAVENOUS | Status: AC
  Administered 2011-09-27: 4 mg via INTRAVENOUS
  Filled 2011-09-27: qty 100

## 2011-09-27 MED ORDER — ONDANSETRON 8 MG/50ML IVPB (CHCC)
8.0000 mg | Freq: Once | INTRAVENOUS | Status: AC
  Administered 2011-09-27: 8 mg via INTRAVENOUS
  Filled 2011-09-27: qty 8

## 2011-09-27 MED ORDER — SODIUM CHLORIDE 0.9 % IV SOLN
Freq: Once | INTRAVENOUS | Status: AC
  Administered 2011-09-27: 13:00:00 via INTRAVENOUS

## 2011-09-27 MED ORDER — ALTEPLASE 2 MG IJ SOLR
2.0000 mg | Freq: Once | INTRAMUSCULAR | Status: AC | PRN
  Administered 2011-09-27: 2 mg
  Filled 2011-09-27: qty 2

## 2011-09-27 MED ORDER — HEPARIN SOD (PORK) LOCK FLUSH 100 UNIT/ML IV SOLN
500.0000 [IU] | Freq: Once | INTRAVENOUS | Status: AC | PRN
  Administered 2011-09-27: 500 [IU]
  Filled 2011-09-27: qty 5

## 2011-09-27 MED ORDER — DEXAMETHASONE SODIUM PHOSPHATE 10 MG/ML IJ SOLN
10.0000 mg | Freq: Once | INTRAMUSCULAR | Status: AC
  Administered 2011-09-27: 10 mg via INTRAVENOUS

## 2011-09-27 MED ORDER — SODIUM CHLORIDE 0.9 % IJ SOLN
10.0000 mL | INTRAMUSCULAR | Status: DC | PRN
  Administered 2011-09-27: 10 mL
  Filled 2011-09-27: qty 10

## 2011-09-27 MED ORDER — SODIUM CHLORIDE 0.9 % IV SOLN: Freq: Once | INTRAVENOUS | Status: DC

## 2011-09-27 NOTE — Progress Notes (Signed)
DIAGNOSIS: Patient Active Problem List  Diagnoses Date Noted  . Anemia in chronic renal disease 09/14/2011  . Myeloma 08/30/2011     Encounter Diagnosis  Name Primary?  . Multiple myeloma Yes    CURRENT THERAPY:  #1. Carfilzomib #2. IVIG every month. #3. Zometa.  INTERIM HISTORY: Patient comes in for scheduled chemotherapy. Chief complaint is of chronic fatigue on this treatment regimen. Starts feeling some better on her week off, and just prior to the next round. Also having several episodes of diarrhea daily. Has Lomotil prescription which she reports using once daily. Had nausea this a.m., has not been an ongoing problem with this treatment regimen.  She asked about delaying treatment the week of Christmas, going to [place] for a week long vacation. We will be happy to adjust the treatment schedule to accommodate her.  Has transient pain, uses Vicodin when necessary but does not require a daily dose.  PHYSICAL EXAM: BP 130/70  Pulse 105  Temp(Src) 98.6 F (37 C) (Oral)  Ht 4\' 11"  (1.499 m)  Wt 142 lb 5 oz (64.553 kg)  BMI 28.74 kg/m2 General: Well developed, well nourished, in no acute distress. Uses a walker for ambulation.  EENT: No ocular or oral lesions. No stomatitis.  Respiratory: Lungs are clear to auscultation bilaterally with normal respiratory movement and no accessory muscle use. Cardiac: No murmur, rub. Tachycardic, rate 105. No upper or lower extremity edema.  GI: Abdomen is soft, no palpable hepatosplenomegaly. No fluid wave. No tenderness. Musculoskeletal: Kyphosis,miled tenderness over the spine and hips. Lymph: No cervical, infraclavicular, axillary or inguinal adenopathy. Neuro: No focal neurological deficits. Psych: Alert and oriented X 3, appropriate mood and affect.   SOCIAL HISTORY:  . Marital Status: Widowed   . Smoking status: Never Smoker   . Smokeless tobacco: Never Used  . Alcohol Use: No  . Drug Use: No  . Sexually Active: No   LABORATORY  STUDIES:   Results for orders placed in visit on 09/27/11  CBC WITH DIFFERENTIAL ([facility])      Component Value Range   WBC 6.5  3.9 - 10.0 (10e3/uL)   RBC 2.81 (*) 3.70 - 5.32 (10e6/uL)   HGB 9.8 (*) 11.6 - 15.9 (g/dL)   HCT 16.1 (*) 09.6 - 46.6 (%)   MCV 110 (*) 81 - 101 (fL)   MCH 34.9 (*) 26.0 - 34.0 (pg)   MCHC 31.6 (*) 32.0 - 36.0 (g/dL)   RDW 04.5 (*) 40.9 - 15.7 (%)   Platelets 183  145 - 400 (10e3/uL)   NEUT# 4.9  1.5 - 6.5 (10e3/uL)   LYMPH# 0.7 (*) 0.9 - 3.3 (10e3/uL)   MONO# 0.8  0.1 - 0.9 (10e3/uL)   Eosinophils Absolute 0.1  0.0 - 0.5 (10e3/uL)   BASO# 0.0  0.0 - 0.2 (10e3/uL)   NEUT% 74.6  39.6 - 80.0 (%)   LYMPH% 10.4 (*) 14.0 - 48.0 (%)   MONO% 12.6  0.0 - 13.0 (%)   EOS% 2.2  0.0 - 7.0 (%)   BASO% 0.2  0.0 - 2.0 (%)    IMPRESSION: 65 year old white female with  #1. Recurrent kappa light chain myeloma, on carfilzomib, IVIG and Zometa. #2. Recurring diarrhea, 2-3 loose stools daily.  PLAN:  #1. Proceed with treatment today. #2. Increase Lomotil to twice daily dose.  #3. Delay treatment scheduled for December 24 and 26 to January 3 and 4 per her request to accommodate vacation.

## 2011-09-28 ENCOUNTER — Ambulatory Visit (HOSPITAL_BASED_OUTPATIENT_CLINIC_OR_DEPARTMENT_OTHER): Payer: Medicare Other

## 2011-09-28 VITALS — BP 170/77 | HR 107 | Temp 96.7°F

## 2011-09-28 DIAGNOSIS — Z5112 Encounter for antineoplastic immunotherapy: Secondary | ICD-10-CM

## 2011-09-28 DIAGNOSIS — L299 Pruritus, unspecified: Secondary | ICD-10-CM

## 2011-09-28 DIAGNOSIS — C9000 Multiple myeloma not having achieved remission: Secondary | ICD-10-CM

## 2011-09-28 MED ORDER — FAMOTIDINE IN NACL 20-0.9 MG/50ML-% IV SOLN
20.0000 mg | Freq: Two times a day (BID) | INTRAVENOUS | Status: DC
  Administered 2011-09-28: 20 mg via INTRAVENOUS

## 2011-09-28 MED ORDER — ONDANSETRON 8 MG/50ML IVPB (CHCC)
8.0000 mg | Freq: Once | INTRAVENOUS | Status: AC
  Administered 2011-09-28: 8 mg via INTRAVENOUS
  Filled 2011-09-28: qty 8

## 2011-09-28 MED ORDER — SODIUM CHLORIDE 0.9 % IJ SOLN
3.0000 mL | INTRAMUSCULAR | Status: DC | PRN
  Administered 2011-09-28: 3 mL via INTRAVENOUS
  Filled 2011-09-28: qty 10

## 2011-09-28 MED ORDER — SODIUM CHLORIDE 0.9 % IV SOLN
Freq: Once | INTRAVENOUS | Status: AC
  Administered 2011-09-28: 11:00:00 via INTRAVENOUS

## 2011-09-28 MED ORDER — HEPARIN SOD (PORK) LOCK FLUSH 100 UNIT/ML IV SOLN
500.0000 [IU] | Freq: Once | INTRAVENOUS | Status: AC | PRN
  Administered 2011-09-28: 500 [IU]
  Filled 2011-09-28: qty 5

## 2011-09-28 MED ORDER — SODIUM CHLORIDE 0.9 % IJ SOLN
10.0000 mL | INTRAMUSCULAR | Status: DC | PRN
  Administered 2011-09-28: 10 mL
  Filled 2011-09-28: qty 10

## 2011-09-28 MED ORDER — DEXAMETHASONE SODIUM PHOSPHATE 10 MG/ML IJ SOLN
10.0000 mg | Freq: Once | INTRAMUSCULAR | Status: AC
  Administered 2011-09-28: 10 mg via INTRAVENOUS

## 2011-09-28 MED ORDER — DEXTROSE 5 % IV SOLN
25.0000 mg/m2 | Freq: Once | INTRAVENOUS | Status: AC
  Administered 2011-09-28: 40 mg via INTRAVENOUS
  Filled 2011-09-28: qty 20

## 2011-09-28 MED ORDER — HEPARIN SOD (PORK) LOCK FLUSH 100 UNIT/ML IV SOLN
250.0000 [IU] | Freq: Once | INTRAVENOUS | Status: DC | PRN
  Filled 2011-09-28: qty 5

## 2011-09-28 MED ORDER — SODIUM CHLORIDE 0.9 % IV SOLN: Freq: Once | INTRAVENOUS | Status: DC

## 2011-09-30 ENCOUNTER — Encounter: Payer: Medicare Other | Admitting: Physical Therapy

## 2011-10-04 ENCOUNTER — Ambulatory Visit (HOSPITAL_BASED_OUTPATIENT_CLINIC_OR_DEPARTMENT_OTHER): Payer: Medicare Other

## 2011-10-04 ENCOUNTER — Other Ambulatory Visit: Payer: Medicare Other | Admitting: Lab

## 2011-10-04 ENCOUNTER — Other Ambulatory Visit (HOSPITAL_BASED_OUTPATIENT_CLINIC_OR_DEPARTMENT_OTHER): Payer: Medicare Other | Admitting: *Deleted

## 2011-10-04 ENCOUNTER — Other Ambulatory Visit: Payer: Self-pay | Admitting: Hematology & Oncology

## 2011-10-04 VITALS — BP 114/66 | HR 77 | Temp 98.7°F

## 2011-10-04 DIAGNOSIS — Z5112 Encounter for antineoplastic immunotherapy: Secondary | ICD-10-CM

## 2011-10-04 DIAGNOSIS — C9000 Multiple myeloma not having achieved remission: Secondary | ICD-10-CM

## 2011-10-04 DIAGNOSIS — Z452 Encounter for adjustment and management of vascular access device: Secondary | ICD-10-CM

## 2011-10-04 LAB — CBC WITH DIFFERENTIAL (CANCER CENTER ONLY)
BASO#: 0 10*3/uL (ref 0.0–0.2)
BASO%: 0.3 % (ref 0.0–2.0)
Eosinophils Absolute: 0.1 10*3/uL (ref 0.0–0.5)
HCT: 27.7 % — ABNORMAL LOW (ref 34.8–46.6)
HGB: 9 g/dL — ABNORMAL LOW (ref 11.6–15.9)
LYMPH#: 0.7 10*3/uL — ABNORMAL LOW (ref 0.9–3.3)
LYMPH%: 17.2 % (ref 14.0–48.0)
MCV: 110 fL — ABNORMAL HIGH (ref 81–101)
MONO#: 0.5 10*3/uL (ref 0.1–0.9)
NEUT%: 67.2 % (ref 39.6–80.0)
RBC: 2.51 10*6/uL — ABNORMAL LOW (ref 3.70–5.32)
WBC: 3.8 10*3/uL — ABNORMAL LOW (ref 3.9–10.0)

## 2011-10-04 MED ORDER — SODIUM CHLORIDE 0.9 % IJ SOLN
10.0000 mL | INTRAMUSCULAR | Status: DC | PRN
  Filled 2011-10-04: qty 10

## 2011-10-04 MED ORDER — HEPARIN SOD (PORK) LOCK FLUSH 100 UNIT/ML IV SOLN
250.0000 [IU] | Freq: Once | INTRAVENOUS | Status: AC | PRN
  Filled 2011-10-04: qty 5

## 2011-10-04 MED ORDER — DEXTROSE 5 % IV SOLN
25.0000 mg/m2 | Freq: Once | INTRAVENOUS | Status: AC
  Administered 2011-10-04: 40 mg via INTRAVENOUS
  Filled 2011-10-04: qty 20

## 2011-10-04 MED ORDER — ALTEPLASE 2 MG IJ SOLR
2.0000 mg | Freq: Once | INTRAMUSCULAR | Status: AC | PRN
  Administered 2011-10-04: 2 mg
  Filled 2011-10-04: qty 2

## 2011-10-04 MED ORDER — SODIUM CHLORIDE 0.9 % IV SOLN: Freq: Once | INTRAVENOUS | Status: DC

## 2011-10-04 MED ORDER — ONDANSETRON 8 MG/50ML IVPB (CHCC)
8.0000 mg | Freq: Once | INTRAVENOUS | Status: AC
  Administered 2011-10-04: 8 mg via INTRAVENOUS
  Filled 2011-10-04: qty 8

## 2011-10-04 MED ORDER — DEXAMETHASONE SODIUM PHOSPHATE 10 MG/ML IJ SOLN
10.0000 mg | Freq: Once | INTRAMUSCULAR | Status: AC
  Administered 2011-10-04: 10 mg via INTRAVENOUS

## 2011-10-04 MED ORDER — SODIUM CHLORIDE 0.9 % IV SOLN
Freq: Once | INTRAVENOUS | Status: AC
  Administered 2011-10-04: 13:00:00 via INTRAVENOUS

## 2011-10-04 MED ORDER — HEPARIN SOD (PORK) LOCK FLUSH 100 UNIT/ML IV SOLN
500.0000 [IU] | Freq: Once | INTRAVENOUS | Status: AC | PRN
  Filled 2011-10-04: qty 5

## 2011-10-05 ENCOUNTER — Other Ambulatory Visit: Payer: Self-pay | Admitting: *Deleted

## 2011-10-05 ENCOUNTER — Ambulatory Visit (HOSPITAL_BASED_OUTPATIENT_CLINIC_OR_DEPARTMENT_OTHER): Payer: Medicare Other

## 2011-10-05 VITALS — BP 150/83 | HR 85 | Temp 97.0°F

## 2011-10-05 DIAGNOSIS — C9000 Multiple myeloma not having achieved remission: Secondary | ICD-10-CM

## 2011-10-05 DIAGNOSIS — Z5112 Encounter for antineoplastic immunotherapy: Secondary | ICD-10-CM

## 2011-10-05 DIAGNOSIS — R300 Dysuria: Secondary | ICD-10-CM

## 2011-10-05 DIAGNOSIS — J40 Bronchitis, not specified as acute or chronic: Secondary | ICD-10-CM

## 2011-10-05 LAB — URINALYSIS, MICROSCOPIC (CHCC SATELLITE)
Ketones: NEGATIVE mg/dL
Nitrite: NEGATIVE
Protein: <30 mg/dL
pH: 6 (ref 4.60–8.00)

## 2011-10-05 MED ORDER — SODIUM CHLORIDE 0.9 % IJ SOLN
10.0000 mL | INTRAMUSCULAR | Status: DC | PRN
  Administered 2011-10-05: 10 mL
  Filled 2011-10-05: qty 10

## 2011-10-05 MED ORDER — HEPARIN SOD (PORK) LOCK FLUSH 100 UNIT/ML IV SOLN
500.0000 [IU] | Freq: Once | INTRAVENOUS | Status: AC | PRN
  Administered 2011-10-05: 500 [IU]
  Filled 2011-10-05: qty 5

## 2011-10-05 MED ORDER — ONDANSETRON 8 MG/50ML IVPB (CHCC)
8.0000 mg | Freq: Once | INTRAVENOUS | Status: AC
  Administered 2011-10-05: 8 mg via INTRAVENOUS
  Filled 2011-10-05: qty 8

## 2011-10-05 MED ORDER — ZOLPIDEM TARTRATE ER 12.5 MG PO TBCR: 12.5000 mg | EXTENDED_RELEASE_TABLET | Freq: Every evening | ORAL | Status: DC | PRN

## 2011-10-05 MED ORDER — SODIUM CHLORIDE 0.9 % IV SOLN
Freq: Once | INTRAVENOUS | Status: AC
  Administered 2011-10-05: 14:00:00 via INTRAVENOUS

## 2011-10-05 MED ORDER — SODIUM CHLORIDE 0.9 % IV SOLN
Freq: Once | INTRAVENOUS | Status: AC
  Administered 2011-10-05: 11:00:00 via INTRAVENOUS

## 2011-10-05 MED ORDER — MOXIFLOXACIN HCL 400 MG PO TABS: 400.0000 mg | ORAL_TABLET | Freq: Every day | ORAL | Status: AC

## 2011-10-05 MED ORDER — DEXAMETHASONE SODIUM PHOSPHATE 10 MG/ML IJ SOLN
10.0000 mg | Freq: Once | INTRAMUSCULAR | Status: AC
  Administered 2011-10-05: 10 mg via INTRAVENOUS

## 2011-10-05 MED ORDER — DEXTROSE 5 % IV SOLN
25.0000 mg/m2 | Freq: Once | INTRAVENOUS | Status: AC
  Administered 2011-10-05: 40 mg via INTRAVENOUS
  Filled 2011-10-05: qty 20

## 2011-10-05 NOTE — Patient Instructions (Signed)
[facility] Discharge Instructions for Patients Receiving Chemotherapy  Today you received the following chemotherapy agents Kyprolis.  To help prevent nausea and vomiting after your treatment, we encourage you to take your nausea medication.   If you develop nausea and vomiting that is not controlled by your nausea medication, call the clinic. If it is after clinic hours your family physician or the after hours number for the clinic or go to the Emergency Department.   BELOW ARE SYMPTOMS THAT SHOULD BE REPORTED IMMEDIATELY:  *FEVER GREATER THAN 100.5 F  *CHILLS WITH OR WITHOUT FEVER  NAUSEA AND VOMITING THAT IS NOT CONTROLLED WITH YOUR NAUSEA MEDICATION  *UNUSUAL SHORTNESS OF BREATH  *UNUSUAL BRUISING OR BLEEDING  TENDERNESS IN MOUTH AND THROAT WITH OR WITHOUT PRESENCE OF ULCERS  *URINARY PROBLEMS  *BOWEL PROBLEMS  UNUSUAL RASH Items with * indicate a potential emergency and should be followed up as soon as possible.  One of the nurses will contact you 24 hours after your treatment. Please let the nurse know about any problems that you may have experienced. Feel free to call the clinic you have any questions or concerns. The clinic phone number is [phone].   I have been informed and understand all the instructions given to me. I know to contact the clinic, my physician, or go to the Emergency Department if any problems should occur. I do not have any questions at this time, but understand that I may call the clinic during office hours   should I have any questions or need assistance in obtaining follow up care.    __________________________________________  _____________  __________ Signature of Patient or Authorized Representative            Date                   Time    __________________________________________ Nurse's Signature    

## 2011-10-07 MED ORDER — SULFAMETHOXAZOLE-TMP DS 800-160 MG PO TABS: 1.0000 | ORAL_TABLET | Freq: Two times a day (BID) | ORAL | Status: DC

## 2011-10-07 NOTE — Progress Notes (Signed)
Addended by: Wynonia Hazard on: 10/07/2011 05:53 PM   Modules accepted: Orders

## 2011-10-07 NOTE — Progress Notes (Signed)
Addended by: Wynonia Hazard on: 10/07/2011 06:49 PM   Modules accepted: Orders

## 2011-10-20 ENCOUNTER — Telehealth: Payer: Self-pay | Admitting: *Deleted

## 2011-10-20 ENCOUNTER — Other Ambulatory Visit: Payer: Self-pay | Admitting: *Deleted

## 2011-10-20 NOTE — Telephone Encounter (Signed)
Pt aware of 1-8 and 1-9

## 2011-10-26 ENCOUNTER — Other Ambulatory Visit: Payer: Self-pay | Admitting: Hematology & Oncology

## 2011-10-26 ENCOUNTER — Ambulatory Visit (HOSPITAL_BASED_OUTPATIENT_CLINIC_OR_DEPARTMENT_OTHER): Payer: Medicare Other

## 2011-10-26 ENCOUNTER — Other Ambulatory Visit (HOSPITAL_BASED_OUTPATIENT_CLINIC_OR_DEPARTMENT_OTHER): Payer: Medicare Other | Admitting: Lab

## 2011-10-26 ENCOUNTER — Telehealth: Payer: Self-pay | Admitting: Oncology

## 2011-10-26 DIAGNOSIS — C9000 Multiple myeloma not having achieved remission: Secondary | ICD-10-CM

## 2011-10-26 DIAGNOSIS — N289 Disorder of kidney and ureter, unspecified: Secondary | ICD-10-CM

## 2011-10-26 DIAGNOSIS — N189 Chronic kidney disease, unspecified: Secondary | ICD-10-CM

## 2011-10-26 DIAGNOSIS — Z5112 Encounter for antineoplastic immunotherapy: Secondary | ICD-10-CM

## 2011-10-26 DIAGNOSIS — D801 Nonfamilial hypogammaglobulinemia: Secondary | ICD-10-CM

## 2011-10-26 DIAGNOSIS — D649 Anemia, unspecified: Secondary | ICD-10-CM

## 2011-10-26 LAB — CBC WITH DIFFERENTIAL (CANCER CENTER ONLY)
Eosinophils Absolute: 0.2 10*3/uL (ref 0.0–0.5)
HCT: 28.1 % — ABNORMAL LOW (ref 34.8–46.6)
LYMPH%: 13.2 % — ABNORMAL LOW (ref 14.0–48.0)
MCH: 37 pg — ABNORMAL HIGH (ref 26.0–34.0)
MCV: 114 fL — ABNORMAL HIGH (ref 81–101)
MONO#: 0.6 10*3/uL (ref 0.1–0.9)
MONO%: 12.3 % (ref 0.0–13.0)
NEUT%: 70.1 % (ref 39.6–80.0)
RBC: 2.46 10*6/uL — ABNORMAL LOW (ref 3.70–5.32)
RDW: 16.4 % — ABNORMAL HIGH (ref 11.1–15.7)
WBC: 4.8 10*3/uL (ref 3.9–10.0)

## 2011-10-26 LAB — BASIC METABOLIC PANEL
CO2: 24 mEq/L (ref 19–32)
Chloride: 104 mEq/L (ref 96–112)
Creatinine, Ser: 1.17 mg/dL — ABNORMAL HIGH (ref 0.50–1.10)
Potassium: 3.8 mEq/L (ref 3.5–5.3)
Sodium: 140 mEq/L (ref 135–145)

## 2011-10-26 MED ORDER — HEPARIN SOD (PORK) LOCK FLUSH 100 UNIT/ML IV SOLN
500.0000 [IU] | Freq: Once | INTRAVENOUS | Status: AC | PRN
  Administered 2011-10-26: 500 [IU]
  Filled 2011-10-26: qty 5

## 2011-10-26 MED ORDER — ONDANSETRON 8 MG/50ML IVPB (CHCC)
8.0000 mg | Freq: Once | INTRAVENOUS | Status: AC
  Administered 2011-10-26: 8 mg via INTRAVENOUS
  Filled 2011-10-26: qty 8

## 2011-10-26 MED ORDER — DEXTROSE 5 % IV SOLN
25.0000 mg/m2 | Freq: Once | INTRAVENOUS | Status: AC
  Administered 2011-10-26: 40 mg via INTRAVENOUS
  Filled 2011-10-26: qty 20

## 2011-10-26 MED ORDER — SODIUM CHLORIDE 0.9 % IV SOLN: Freq: Once | INTRAVENOUS | Status: DC

## 2011-10-26 MED ORDER — ZOLEDRONIC ACID 4 MG/5ML IV CONC
3.3000 mg | Freq: Once | INTRAVENOUS | Status: AC
  Administered 2011-10-26: 3.3 mg via INTRAVENOUS
  Filled 2011-10-26: qty 4.13

## 2011-10-26 MED ORDER — IMMUNE GLOBULIN (HUMAN) 20 GM/200ML IV SOLN
50.0000 g | Freq: Once | INTRAVENOUS | Status: AC
  Administered 2011-10-26: 50 g via INTRAVENOUS
  Filled 2011-10-26: qty 500

## 2011-10-26 MED ORDER — ALTEPLASE 2 MG IJ SOLR
2.0000 mg | Freq: Once | INTRAMUSCULAR | Status: AC
  Administered 2011-10-26: 2 mg
  Filled 2011-10-26: qty 2

## 2011-10-26 MED ORDER — HEPARIN SOD (PORK) LOCK FLUSH 100 UNIT/ML IV SOLN
250.0000 [IU] | Freq: Once | INTRAVENOUS | Status: DC | PRN
  Filled 2011-10-26: qty 5

## 2011-10-26 MED ORDER — ACETAMINOPHEN 325 MG PO TABS
650.0000 mg | ORAL_TABLET | Freq: Once | ORAL | Status: AC
  Administered 2011-10-26: 650 mg via ORAL

## 2011-10-26 MED ORDER — SODIUM CHLORIDE 0.9 % IV SOLN
Freq: Once | INTRAVENOUS | Status: AC
  Administered 2011-10-26: 16:00:00 via INTRAVENOUS

## 2011-10-26 MED ORDER — DIPHENHYDRAMINE HCL 25 MG PO CAPS
25.0000 mg | ORAL_CAPSULE | Freq: Once | ORAL | Status: AC
  Administered 2011-10-26: 25 mg via ORAL

## 2011-10-26 MED ORDER — SODIUM CHLORIDE 0.9 % IJ SOLN
3.0000 mL | Freq: Once | INTRAMUSCULAR | Status: AC | PRN
  Administered 2011-10-26: 3 mL via INTRAVENOUS
  Filled 2011-10-26: qty 10

## 2011-10-26 MED ORDER — DEXAMETHASONE SODIUM PHOSPHATE 10 MG/ML IJ SOLN
10.0000 mg | Freq: Once | INTRAMUSCULAR | Status: AC
  Administered 2011-10-26: 10 mg via INTRAVENOUS

## 2011-10-26 MED ORDER — ZOLEDRONIC ACID 4 MG/5ML IV CONC: 4.0000 mg | Freq: Once | INTRAVENOUS | Status: DC

## 2011-10-27 ENCOUNTER — Ambulatory Visit (HOSPITAL_BASED_OUTPATIENT_CLINIC_OR_DEPARTMENT_OTHER): Payer: Medicare Other

## 2011-10-27 ENCOUNTER — Other Ambulatory Visit: Payer: Self-pay | Admitting: *Deleted

## 2011-10-27 ENCOUNTER — Ambulatory Visit (HOSPITAL_BASED_OUTPATIENT_CLINIC_OR_DEPARTMENT_OTHER)
Admission: RE | Admit: 2011-10-27 | Discharge: 2011-10-27 | Disposition: A | Discharge status: Home / Self Care | Payer: Medicare Other | Source: Ambulatory Visit | Attending: Hematology & Oncology | Admitting: Hematology & Oncology

## 2011-10-27 VITALS — BP 130/74 | HR 95 | Temp 97.6°F

## 2011-10-27 DIAGNOSIS — Z5112 Encounter for antineoplastic immunotherapy: Secondary | ICD-10-CM

## 2011-10-27 DIAGNOSIS — C9000 Multiple myeloma not having achieved remission: Secondary | ICD-10-CM

## 2011-10-27 DIAGNOSIS — F419 Anxiety disorder, unspecified: Secondary | ICD-10-CM

## 2011-10-27 DIAGNOSIS — Z1231 Encounter for screening mammogram for malignant neoplasm of breast: Secondary | ICD-10-CM | POA: Insufficient documentation

## 2011-10-27 MED ORDER — ALPRAZOLAM 0.25 MG PO TABS: 0.2500 mg | ORAL_TABLET | Freq: Three times a day (TID) | ORAL | Status: DC | PRN

## 2011-10-27 MED ORDER — DEXTROSE 5 % IV SOLN
25.0000 mg/m2 | Freq: Once | INTRAVENOUS | Status: AC
  Administered 2011-10-27: 40 mg via INTRAVENOUS
  Filled 2011-10-27: qty 20

## 2011-10-27 MED ORDER — SODIUM CHLORIDE 0.9 % IJ SOLN
10.0000 mL | INTRAMUSCULAR | Status: DC | PRN
  Administered 2011-10-27: 10 mL
  Filled 2011-10-27: qty 10

## 2011-10-27 MED ORDER — DEXAMETHASONE SODIUM PHOSPHATE 10 MG/ML IJ SOLN
10.0000 mg | Freq: Once | INTRAMUSCULAR | Status: AC
  Administered 2011-10-27: 10 mg via INTRAVENOUS

## 2011-10-27 MED ORDER — ONDANSETRON 8 MG/50ML IVPB (CHCC)
8.0000 mg | Freq: Once | INTRAVENOUS | Status: AC
  Administered 2011-10-27: 8 mg via INTRAVENOUS
  Filled 2011-10-27: qty 8

## 2011-10-27 MED ORDER — FAMOTIDINE 20 MG PO TABS
20.0000 mg | ORAL_TABLET | Freq: Once | ORAL | Status: AC
  Administered 2011-10-27: 20 mg via ORAL

## 2011-10-27 MED ORDER — HEPARIN SOD (PORK) LOCK FLUSH 100 UNIT/ML IV SOLN
500.0000 [IU] | Freq: Once | INTRAVENOUS | Status: AC | PRN
  Administered 2011-10-27: 500 [IU]
  Filled 2011-10-27: qty 5

## 2011-10-27 MED ORDER — SODIUM CHLORIDE 0.9 % IV SOLN
Freq: Once | INTRAVENOUS | Status: AC
  Administered 2011-10-27: 11:00:00 via INTRAVENOUS

## 2011-10-27 MED ORDER — SODIUM CHLORIDE 0.9 % IV SOLN
Freq: Once | INTRAVENOUS | Status: AC
  Administered 2011-10-27: 13:00:00 via INTRAVENOUS

## 2011-10-27 NOTE — Patient Instructions (Signed)
[facility] Discharge Instructions for Patients Receiving Chemotherapy  Today you received the following chemotherapy agents Kyprolis To help prevent nausea and vomiting after your treatment, we encourage you to take your nausea medication as prescribed.  If you develop nausea and vomiting that is not controlled by your nausea medication, call the clinic. If it is after clinic hours your family physician or the after hours number for the clinic or go to the Emergency Department.   BELOW ARE SYMPTOMS THAT SHOULD BE REPORTED IMMEDIATELY:  *FEVER GREATER THAN 100.5 F  *CHILLS WITH OR WITHOUT FEVER  NAUSEA AND VOMITING THAT IS NOT CONTROLLED WITH YOUR NAUSEA MEDICATION  *UNUSUAL SHORTNESS OF BREATH  *UNUSUAL BRUISING OR BLEEDING  TENDERNESS IN MOUTH AND THROAT WITH OR WITHOUT PRESENCE OF ULCERS  *URINARY PROBLEMS  *BOWEL PROBLEMS  UNUSUAL RASH Items with * indicate a potential emergency and should be followed up as soon as possible.  One of the nurses will contact you 24 hours after your treatment. Please let the nurse know about any problems that you may have experienced. Feel free to call the clinic you have any questions or concerns. The clinic phone number is [phone].   I have been informed and understand all the instructions given to me. I know to contact the clinic, my physician, or go to the Emergency Department if any problems should occur. I do not have any questions at this time, but understand that I may call the clinic during office hours   should I have any questions or need assistance in obtaining follow up care.    __________________________________________  _____________  __________ Signature of Patient or Authorized Representative            Date                   Time    __________________________________________ Nurse's Signature    

## 2011-11-01 ENCOUNTER — Encounter (HOSPITAL_COMMUNITY)
Admission: RE | Admit: 2011-11-01 | Discharge: 2011-11-01 | Disposition: A | Discharge status: Home / Self Care | Payer: Medicare Other | Source: Ambulatory Visit | Attending: Hematology & Oncology | Admitting: Hematology & Oncology

## 2011-11-01 ENCOUNTER — Other Ambulatory Visit: Payer: Self-pay | Admitting: Hematology & Oncology

## 2011-11-01 ENCOUNTER — Ambulatory Visit (HOSPITAL_BASED_OUTPATIENT_CLINIC_OR_DEPARTMENT_OTHER): Payer: Medicare Other

## 2011-11-01 ENCOUNTER — Other Ambulatory Visit (HOSPITAL_BASED_OUTPATIENT_CLINIC_OR_DEPARTMENT_OTHER): Payer: Medicare Other | Admitting: Lab

## 2011-11-01 DIAGNOSIS — D649 Anemia, unspecified: Secondary | ICD-10-CM

## 2011-11-01 DIAGNOSIS — C9000 Multiple myeloma not having achieved remission: Secondary | ICD-10-CM | POA: Insufficient documentation

## 2011-11-01 DIAGNOSIS — Z452 Encounter for adjustment and management of vascular access device: Secondary | ICD-10-CM

## 2011-11-01 DIAGNOSIS — Z5112 Encounter for antineoplastic immunotherapy: Secondary | ICD-10-CM

## 2011-11-01 DIAGNOSIS — N189 Chronic kidney disease, unspecified: Secondary | ICD-10-CM | POA: Insufficient documentation

## 2011-11-01 DIAGNOSIS — D631 Anemia in chronic kidney disease: Secondary | ICD-10-CM | POA: Insufficient documentation

## 2011-11-01 DIAGNOSIS — D801 Nonfamilial hypogammaglobulinemia: Secondary | ICD-10-CM

## 2011-11-01 DIAGNOSIS — N289 Disorder of kidney and ureter, unspecified: Secondary | ICD-10-CM

## 2011-11-01 LAB — HOLD TUBE, BLOOD BANK - CHCC SATELLITE

## 2011-11-01 LAB — TYPE AND SCREEN
Antibody Screen: NEGATIVE
Unit division: 0

## 2011-11-01 LAB — CBC WITH DIFFERENTIAL (CANCER CENTER ONLY)
BASO#: 0 10*3/uL (ref 0.0–0.2)
BASO%: 0.3 % (ref 0.0–2.0)
HCT: 27 % — ABNORMAL LOW (ref 34.8–46.6)
HGB: 8.6 g/dL — ABNORMAL LOW (ref 11.6–15.9)
LYMPH#: 0.6 10*3/uL — ABNORMAL LOW (ref 0.9–3.3)
MONO#: 0.5 10*3/uL (ref 0.1–0.9)
NEUT%: 65 % (ref 39.6–80.0)
WBC: 3.8 10*3/uL — ABNORMAL LOW (ref 3.9–10.0)

## 2011-11-01 LAB — PREPARE RBC (CROSSMATCH)

## 2011-11-01 MED ORDER — SODIUM CHLORIDE 0.9 % IV SOLN
Freq: Once | INTRAVENOUS | Status: AC
  Administered 2011-11-01: 16:00:00 via INTRAVENOUS

## 2011-11-01 MED ORDER — SODIUM CHLORIDE 0.9 % IJ SOLN
10.0000 mL | INTRAMUSCULAR | Status: DC | PRN
  Filled 2011-11-01: qty 10

## 2011-11-01 MED ORDER — SODIUM CHLORIDE 0.9 % IV SOLN
Freq: Once | INTRAVENOUS | Status: AC
  Administered 2011-11-01: 15:00:00 via INTRAVENOUS

## 2011-11-01 MED ORDER — ONDANSETRON 8 MG/50ML IVPB (CHCC)
8.0000 mg | Freq: Once | INTRAVENOUS | Status: AC
  Administered 2011-11-01: 8 mg via INTRAVENOUS
  Filled 2011-11-01: qty 8

## 2011-11-01 MED ORDER — ALTEPLASE 2 MG IJ SOLR
2.0000 mg | Freq: Once | INTRAMUSCULAR | Status: AC | PRN
  Administered 2011-11-01: 2 mg
  Filled 2011-11-01: qty 2

## 2011-11-01 MED ORDER — DEXTROSE 5 % IV SOLN
25.0000 mg/m2 | Freq: Once | INTRAVENOUS | Status: AC
  Administered 2011-11-01: 40 mg via INTRAVENOUS
  Filled 2011-11-01: qty 20

## 2011-11-01 MED ORDER — DEXAMETHASONE SODIUM PHOSPHATE 10 MG/ML IJ SOLN
10.0000 mg | Freq: Once | INTRAMUSCULAR | Status: AC
  Administered 2011-11-01: 10 mg via INTRAVENOUS

## 2011-11-01 MED ORDER — HEPARIN SOD (PORK) LOCK FLUSH 100 UNIT/ML IV SOLN
500.0000 [IU] | Freq: Once | INTRAVENOUS | Status: DC | PRN
  Filled 2011-11-01: qty 5

## 2011-11-01 NOTE — Patient Instructions (Signed)
[facility] Discharge Instructions for Patients Receiving Chemotherapy  Today you received the following chemotherapy agents  To help prevent nausea and vomiting after your treatment, we encourage you to take your nausea medication    If you develop nausea and vomiting that is not controlled by your nausea medication, call the clinic. If it is after clinic hours your family physician or the after hours number for the clinic or go to the Emergency Department.   BELOW ARE SYMPTOMS THAT SHOULD BE REPORTED IMMEDIATELY:  *FEVER GREATER THAN 100.5 F  *CHILLS WITH OR WITHOUT FEVER  NAUSEA AND VOMITING THAT IS NOT CONTROLLED WITH YOUR NAUSEA MEDICATION  *UNUSUAL SHORTNESS OF BREATH  *UNUSUAL BRUISING OR BLEEDING  TENDERNESS IN MOUTH AND THROAT WITH OR WITHOUT PRESENCE OF ULCERS  *URINARY PROBLEMS  *BOWEL PROBLEMS  UNUSUAL RASH Items with * indicate a potential emergency and should be followed up as soon as possible.  One of the nurses will contact you 24 hours after your treatment. Please let the nurse know about any problems that you may have experienced. Feel free to call the clinic you have any questions or concerns. The clinic phone number is [phone].   I have been informed and understand all the instructions given to me. I know to contact the clinic, my physician, or go to the Emergency Department if any problems should occur. I do not have any questions at this time, but understand that I may call the clinic during office hours   should I have any questions or need assistance in obtaining follow up care.    __________________________________________  _____________  __________ Signature of Patient or Authorized Representative            Date                   Time    __________________________________________ Nurse's Signature    

## 2011-11-02 ENCOUNTER — Ambulatory Visit (HOSPITAL_BASED_OUTPATIENT_CLINIC_OR_DEPARTMENT_OTHER): Payer: Medicare Other

## 2011-11-02 ENCOUNTER — Ambulatory Visit: Payer: Medicare Other

## 2011-11-02 VITALS — BP 144/80 | HR 90 | Temp 98.7°F

## 2011-11-02 DIAGNOSIS — D631 Anemia in chronic kidney disease: Secondary | ICD-10-CM

## 2011-11-02 DIAGNOSIS — C9000 Multiple myeloma not having achieved remission: Secondary | ICD-10-CM

## 2011-11-02 DIAGNOSIS — D649 Anemia, unspecified: Secondary | ICD-10-CM

## 2011-11-02 MED ORDER — DIPHENHYDRAMINE HCL 50 MG/ML IJ SOLN
25.0000 mg | Freq: Once | INTRAMUSCULAR | Status: AC
  Administered 2011-11-02: 25 mg via INTRAVENOUS

## 2011-11-02 MED ORDER — SODIUM CHLORIDE 0.9 % IV SOLN
Freq: Once | INTRAVENOUS | Status: AC
  Administered 2011-11-02: 10:00:00 via INTRAVENOUS

## 2011-11-02 MED ORDER — SODIUM CHLORIDE 0.9 % IJ SOLN
10.0000 mL | INTRAMUSCULAR | Status: DC | PRN
  Filled 2011-11-02: qty 10

## 2011-11-02 MED ORDER — SODIUM CHLORIDE 0.9 % IV SOLN: Freq: Once | INTRAVENOUS | Status: DC

## 2011-11-02 MED ORDER — HEPARIN SOD (PORK) LOCK FLUSH 100 UNIT/ML IV SOLN
500.0000 [IU] | Freq: Once | INTRAVENOUS | Status: AC | PRN
  Filled 2011-11-02: qty 5

## 2011-11-02 MED ORDER — DEXTROSE 5 % IV SOLN
25.0000 mg/m2 | Freq: Once | INTRAVENOUS | Status: AC
  Administered 2011-11-02: 40 mg via INTRAVENOUS
  Filled 2011-11-02: qty 20

## 2011-11-02 MED ORDER — DEXAMETHASONE SODIUM PHOSPHATE 10 MG/ML IJ SOLN
10.0000 mg | Freq: Once | INTRAMUSCULAR | Status: AC
  Administered 2011-11-02: 10 mg via INTRAVENOUS

## 2011-11-02 MED ORDER — FUROSEMIDE 10 MG/ML IJ SOLN
20.0000 mg | Freq: Once | INTRAMUSCULAR | Status: AC
  Administered 2011-11-02: 20 mg via INTRAVENOUS

## 2011-11-02 MED ORDER — ACETAMINOPHEN 325 MG PO TABS
650.0000 mg | ORAL_TABLET | Freq: Once | ORAL | Status: AC
  Administered 2011-11-02: 650 mg via ORAL

## 2011-11-02 MED ORDER — ONDANSETRON 8 MG/50ML IVPB (CHCC)
8.0000 mg | Freq: Once | INTRAVENOUS | Status: AC
  Administered 2011-11-02: 8 mg via INTRAVENOUS
  Filled 2011-11-02: qty 8

## 2011-11-05 ENCOUNTER — Encounter (HOSPITAL_COMMUNITY): Payer: Medicare Other

## 2011-11-08 ENCOUNTER — Ambulatory Visit (HOSPITAL_BASED_OUTPATIENT_CLINIC_OR_DEPARTMENT_OTHER): Payer: Medicare Other

## 2011-11-08 ENCOUNTER — Ambulatory Visit: Payer: Medicare Other | Admitting: Lab

## 2011-11-08 VITALS — BP 112/61 | HR 82 | Temp 97.7°F

## 2011-11-08 DIAGNOSIS — C9000 Multiple myeloma not having achieved remission: Secondary | ICD-10-CM

## 2011-11-08 DIAGNOSIS — Z5111 Encounter for antineoplastic chemotherapy: Secondary | ICD-10-CM

## 2011-11-08 DIAGNOSIS — G4701 Insomnia due to medical condition: Secondary | ICD-10-CM

## 2011-11-08 LAB — CMP (CANCER CENTER ONLY)
CO2: 29 mEq/L (ref 18–33)
Calcium: 9.4 mg/dL (ref 8.0–10.3)
Chloride: 103 mEq/L (ref 98–108)
Glucose, Bld: 114 mg/dL (ref 73–118)
Sodium: 143 mEq/L (ref 128–145)
Total Bilirubin: 0.6 mg/dl (ref 0.20–1.60)
Total Protein: 7.4 g/dL (ref 6.4–8.1)

## 2011-11-08 LAB — IRON AND TIBC: Iron: 106 ug/dL (ref 42–145)

## 2011-11-08 LAB — CBC WITH DIFFERENTIAL (CANCER CENTER ONLY)
BASO#: 0 10*3/uL (ref 0.0–0.2)
EOS%: 2.4 % (ref 0.0–7.0)
LYMPH%: 15.6 % (ref 14.0–48.0)
MCH: 33.7 pg (ref 26.0–34.0)
MCHC: 32.9 g/dL (ref 32.0–36.0)
MONO%: 12.2 % (ref 0.0–13.0)
NEUT#: 3.5 10*3/uL (ref 1.5–6.5)
Platelets: 113 10*3/uL — ABNORMAL LOW (ref 145–400)

## 2011-11-08 LAB — FERRITIN: Ferritin: 3187 ng/mL — ABNORMAL HIGH (ref 10–291)

## 2011-11-08 LAB — RETICULOCYTES (CHCC)
RBC.: 3.78 MIL/uL — ABNORMAL LOW (ref 3.87–5.11)
Retic Ct Pct: 3 % — ABNORMAL HIGH (ref 0.4–2.3)

## 2011-11-08 MED ORDER — DEXTROSE 5 % IV SOLN
25.0000 mg/m2 | Freq: Once | INTRAVENOUS | Status: AC
  Administered 2011-11-08: 40 mg via INTRAVENOUS
  Filled 2011-11-08: qty 20

## 2011-11-08 MED ORDER — SODIUM CHLORIDE 0.9 % IV SOLN
Freq: Once | INTRAVENOUS | Status: AC
  Administered 2011-11-08: 14:00:00 via INTRAVENOUS

## 2011-11-08 MED ORDER — ONDANSETRON 8 MG/50ML IVPB (CHCC)
8.0000 mg | Freq: Once | INTRAVENOUS | Status: AC
  Administered 2011-11-08: 8 mg via INTRAVENOUS

## 2011-11-08 MED ORDER — HEPARIN SOD (PORK) LOCK FLUSH 100 UNIT/ML IV SOLN
500.0000 [IU] | Freq: Once | INTRAVENOUS | Status: DC | PRN
  Administered 2011-11-08: 500 [IU]
  Filled 2011-11-08: qty 5

## 2011-11-08 MED ORDER — ALTEPLASE 2 MG IJ SOLR
2.0000 mg | Freq: Once | INTRAMUSCULAR | Status: AC | PRN
  Administered 2011-11-08: 2 mg
  Filled 2011-11-08: qty 2

## 2011-11-08 MED ORDER — TEMAZEPAM 15 MG PO CAPS: 15.0000 mg | ORAL_CAPSULE | Freq: Every evening | ORAL | Status: DC | PRN

## 2011-11-08 MED ORDER — SODIUM CHLORIDE 0.9 % IV SOLN: Freq: Once | INTRAVENOUS | Status: DC

## 2011-11-08 MED ORDER — DEXAMETHASONE SODIUM PHOSPHATE 10 MG/ML IJ SOLN
10.0000 mg | Freq: Once | INTRAMUSCULAR | Status: AC
  Administered 2011-11-08: 10 mg via INTRAVENOUS

## 2011-11-08 MED ORDER — SODIUM CHLORIDE 0.9 % IJ SOLN
10.0000 mL | INTRAMUSCULAR | Status: DC | PRN
  Administered 2011-11-08: 10 mL
  Filled 2011-11-08: qty 10

## 2011-11-08 NOTE — Progress Notes (Signed)
Addended by: Josph Macho on: 11/08/2011 06:17 PM   Modules accepted: Orders, Medications

## 2011-11-08 NOTE — Progress Notes (Signed)
Chart entered in error

## 2011-11-08 NOTE — Progress Notes (Signed)
Addended by: Rosario Adie A on: 11/08/2011 02:05 PM   Modules accepted: Orders

## 2011-11-09 ENCOUNTER — Encounter (HOSPITAL_COMMUNITY): Payer: Medicare Other

## 2011-11-09 ENCOUNTER — Ambulatory Visit (HOSPITAL_BASED_OUTPATIENT_CLINIC_OR_DEPARTMENT_OTHER): Payer: Medicare Other

## 2011-11-09 DIAGNOSIS — Z5112 Encounter for antineoplastic immunotherapy: Secondary | ICD-10-CM

## 2011-11-09 DIAGNOSIS — C9000 Multiple myeloma not having achieved remission: Secondary | ICD-10-CM

## 2011-11-09 MED ORDER — HEPARIN SOD (PORK) LOCK FLUSH 100 UNIT/ML IV SOLN
500.0000 [IU] | Freq: Once | INTRAVENOUS | Status: DC | PRN
  Filled 2011-11-09: qty 5

## 2011-11-09 MED ORDER — ONDANSETRON 8 MG/50ML IVPB (CHCC)
8.0000 mg | Freq: Once | INTRAVENOUS | Status: AC
  Administered 2011-11-09: 8 mg via INTRAVENOUS
  Filled 2011-11-09: qty 8

## 2011-11-09 MED ORDER — SODIUM CHLORIDE 0.9 % IV SOLN: Freq: Once | INTRAVENOUS | Status: DC

## 2011-11-09 MED ORDER — DEXTROSE 5 % IV SOLN
25.0000 mg/m2 | Freq: Once | INTRAVENOUS | Status: AC
  Administered 2011-11-09: 40 mg via INTRAVENOUS
  Filled 2011-11-09: qty 20

## 2011-11-09 MED ORDER — SODIUM CHLORIDE 0.9 % IJ SOLN
10.0000 mL | INTRAMUSCULAR | Status: DC | PRN
  Filled 2011-11-09: qty 10

## 2011-11-09 MED ORDER — DEXAMETHASONE SODIUM PHOSPHATE 10 MG/ML IJ SOLN
10.0000 mg | Freq: Once | INTRAMUSCULAR | Status: AC
  Administered 2011-11-09: 10 mg via INTRAVENOUS

## 2011-11-09 MED ORDER — SODIUM CHLORIDE 0.9 % IV SOLN
Freq: Once | INTRAVENOUS | Status: AC
  Administered 2011-11-09: 13:00:00 via INTRAVENOUS

## 2011-11-15 ENCOUNTER — Encounter (HOSPITAL_COMMUNITY): Payer: Medicare Other

## 2011-11-22 ENCOUNTER — Ambulatory Visit: Payer: Medicare Other | Admitting: Hematology & Oncology

## 2011-11-22 ENCOUNTER — Telehealth: Payer: Self-pay | Admitting: Hematology & Oncology

## 2011-11-22 ENCOUNTER — Other Ambulatory Visit: Payer: Medicare Other | Admitting: Lab

## 2011-11-22 ENCOUNTER — Ambulatory Visit: Payer: Medicare Other

## 2011-11-22 NOTE — Telephone Encounter (Signed)
Pt aware of 2-19 and 2-27 appointments all others have been cancelled.

## 2011-11-23 ENCOUNTER — Ambulatory Visit: Payer: Medicare Other

## 2011-11-25 NOTE — Telephone Encounter (Signed)
Erroneous encounter

## 2011-11-29 ENCOUNTER — Ambulatory Visit: Payer: Medicare Other

## 2011-11-29 ENCOUNTER — Other Ambulatory Visit: Payer: Medicare Other | Admitting: Lab

## 2011-11-30 ENCOUNTER — Ambulatory Visit: Payer: Medicare Other

## 2011-12-01 ENCOUNTER — Encounter (HOSPITAL_COMMUNITY): Payer: Self-pay | Admitting: Pharmacy Technician

## 2011-12-06 ENCOUNTER — Ambulatory Visit: Payer: Medicare Other

## 2011-12-06 ENCOUNTER — Other Ambulatory Visit: Payer: Medicare Other | Admitting: Lab

## 2011-12-06 ENCOUNTER — Other Ambulatory Visit: Payer: Self-pay | Admitting: *Deleted

## 2011-12-06 DIAGNOSIS — M62838 Other muscle spasm: Secondary | ICD-10-CM

## 2011-12-06 DIAGNOSIS — R197 Diarrhea, unspecified: Secondary | ICD-10-CM

## 2011-12-06 NOTE — Telephone Encounter (Signed)
Received [facility]ill [facility] from [facility] Lomotil and Valium. Pt with range of refills. Will route to Dr Myna Hidalgo for approval.

## 2011-12-07 ENCOUNTER — Inpatient Hospital Stay (HOSPITAL_COMMUNITY): Admission: RE | Admit: 2011-12-07 | Payer: Medicare Other | Source: Ambulatory Visit

## 2011-12-07 ENCOUNTER — Other Ambulatory Visit: Payer: Self-pay | Admitting: *Deleted

## 2011-12-07 ENCOUNTER — Ambulatory Visit: Payer: Medicare Other

## 2011-12-07 DIAGNOSIS — C9000 Multiple myeloma not having achieved remission: Secondary | ICD-10-CM

## 2011-12-07 MED ORDER — DIPHENOXYLATE-ATROPINE 2.5-0.025 MG PO TABS: 1.0000 | ORAL_TABLET | Freq: Four times a day (QID) | ORAL | Status: DC | PRN

## 2011-12-07 MED ORDER — DIAZEPAM 5 MG PO TABS: 5.0000 mg | ORAL_TABLET | Freq: Four times a day (QID) | ORAL | Status: DC | PRN

## 2011-12-07 NOTE — Progress Notes (Signed)
Spoke to regarding rescheduling her BMBX. She showed up this morning at [facility] as previously scheduled for her bx. Explained to her that her dgtr Victorino Dike called a few weeks ago & asked Alvino Chapel to cancel her appt as she couldn't take being on bedrest with her pregnancy along with thinking about her Mom's BMBX. As instructed her appt was cancelled but the pt was not informed of this by [facility]. Ms. Charlson was called at home and on her cell yesterday and today, leaving a message on both lines trying to r/s her appt for one Tuesday morning soon. It was arranged for her to come in on 12/14/11 at 0700 for an 0800 BMBX. She understands her instructions and asks that if Victorino Dike cancels any further appts that she be asked 1st before doing so. Explained that the office would make their best effort to do so.

## 2011-12-08 ENCOUNTER — Other Ambulatory Visit: Payer: Self-pay | Admitting: *Deleted

## 2011-12-08 ENCOUNTER — Telehealth: Payer: Self-pay | Admitting: Hematology & Oncology

## 2011-12-08 NOTE — Telephone Encounter (Signed)
Pt aware moved 2-27 to 3-4

## 2011-12-14 ENCOUNTER — Encounter (HOSPITAL_COMMUNITY): Payer: Self-pay

## 2011-12-14 ENCOUNTER — Ambulatory Visit (HOSPITAL_BASED_OUTPATIENT_CLINIC_OR_DEPARTMENT_OTHER): Payer: Medicare Other | Admitting: Hematology & Oncology

## 2011-12-14 ENCOUNTER — Ambulatory Visit (HOSPITAL_COMMUNITY)
Admission: RE | Admit: 2011-12-14 | Discharge: 2011-12-14 | Disposition: A | Discharge status: Home / Self Care | Payer: Medicare Other | Source: Ambulatory Visit | Attending: Hematology & Oncology | Admitting: Hematology & Oncology

## 2011-12-14 DIAGNOSIS — C9000 Multiple myeloma not having achieved remission: Secondary | ICD-10-CM

## 2011-12-14 DIAGNOSIS — D539 Nutritional anemia, unspecified: Secondary | ICD-10-CM | POA: Insufficient documentation

## 2011-12-14 DIAGNOSIS — E8809 Other disorders of plasma-protein metabolism, not elsewhere classified: Secondary | ICD-10-CM | POA: Insufficient documentation

## 2011-12-14 LAB — DIFFERENTIAL
Basophils Absolute: 0 10*3/uL (ref 0.0–0.1)
Basophils Relative: 0 % (ref 0–1)
Eosinophils Absolute: 0.2 10*3/uL (ref 0.0–0.7)
Eosinophils Relative: 4 % (ref 0–5)
Lymphocytes Relative: 16 % (ref 12–46)
Monocytes Absolute: 0.5 10*3/uL (ref 0.1–1.0)

## 2011-12-14 LAB — CBC
HCT: 27.7 % — ABNORMAL LOW (ref 36.0–46.0)
MCH: 33.2 pg (ref 26.0–34.0)
MCHC: 32.9 g/dL (ref 30.0–36.0)
MCV: 101.1 fL — ABNORMAL HIGH (ref 78.0–100.0)
Platelets: 190 10*3/uL (ref 150–400)
RDW: 19.2 % — ABNORMAL HIGH (ref 11.5–15.5)
WBC: 4.3 10*3/uL (ref 4.0–10.5)

## 2011-12-14 MED ORDER — SODIUM CHLORIDE 0.9 % IV SOLN
Freq: Once | INTRAVENOUS | Status: AC
  Administered 2011-12-14: 500 mL via INTRAVENOUS

## 2011-12-14 MED ORDER — MEPERIDINE HCL 50 MG/ML IJ SOLN
INTRAMUSCULAR | Status: AC
  Administered 2011-12-14: 25 mg
  Filled 2011-12-14: qty 1

## 2011-12-14 MED ORDER — MIDAZOLAM HCL 10 MG/2ML IJ SOLN
INTRAMUSCULAR | Status: AC
  Administered 2011-12-14: 5 mg
  Filled 2011-12-14: qty 2

## 2011-12-14 MED ORDER — MEPERIDINE HCL 25 MG/ML IJ SOLN
INTRAMUSCULAR | Status: AC | PRN
  Administered 2011-12-14: 25 mg via INTRAVENOUS

## 2011-12-14 NOTE — Progress Notes (Signed)
This office note has been dictated. I spent putting in all of her orders

## 2011-12-14 NOTE — ED Notes (Signed)
Patient denies pain and is resting comfortably.  

## 2011-12-14 NOTE — Discharge Instructions (Signed)
Do not drive  For 24 hours Do not go into public places today May resume your regular diet and take home medications as usual May experience small amount of tingling in leg (biopsy side) May take shower and remove bandage in am For any questions or concerns, call dr If bleeding occurs at site, hold pressure x10 minutes  If continues, call doctor 

## 2011-12-14 NOTE — ED Notes (Signed)
Vital signs stable. 

## 2011-12-14 NOTE — ED Notes (Signed)
Family updated as to patient's status.

## 2011-12-14 NOTE — ED Notes (Signed)
Patient is resting comfortably. 

## 2011-12-14 NOTE — Sedation Documentation (Signed)
Medication dose calculated and verified for [name] total demerol 25 and versed 7 mg

## 2011-12-14 NOTE — ED Notes (Signed)
Procedure tolerated well  Assisted c dressing/ up to wheelchair  tol well  Home c friend

## 2011-12-14 NOTE — Procedures (Signed)
Ms. Sarnowski was seen at [facility].  She was scheduled for a bone marrow biopsy today.  We performed the appropriate time-out procedure.  Her Mallampati score was 2.  Her ASA class was 2.  She had her Port-A-Cath accessed without difficulty.  She received a total of 7 mg of Versed and 25 mg Demerol for IV sedation.  She was placed onto her right side.  The left posterior iliac crest region was prepped and draped in sterile fashion.  10 cc of 2% Xylocaine was infiltrated under the skin down to the periosteum.  A #11 scalpel was used to make an incision into the skin.  Two bone marrow aspirates were obtained without difficulty.  We then obtained an excellent bone marrow biopsy core.  The patient tolerated the procedure well.  There were no complications.    ______________________________ Josph Macho, M.D. PRE/MEDQ  D:  12/14/2011  T:  12/14/2011  Job:  [id number]

## 2011-12-14 NOTE — ED Notes (Signed)
Sacral dsg cdi

## 2011-12-15 ENCOUNTER — Other Ambulatory Visit: Payer: Medicare Other | Admitting: Lab

## 2011-12-15 ENCOUNTER — Ambulatory Visit: Payer: Medicare Other

## 2011-12-15 ENCOUNTER — Ambulatory Visit: Payer: Medicare Other | Admitting: Hematology & Oncology

## 2011-12-16 ENCOUNTER — Other Ambulatory Visit: Payer: Self-pay | Admitting: Hematology & Oncology

## 2011-12-16 DIAGNOSIS — C9000 Multiple myeloma not having achieved remission: Secondary | ICD-10-CM

## 2011-12-17 ENCOUNTER — Other Ambulatory Visit: Payer: Self-pay | Admitting: Hematology & Oncology

## 2011-12-17 ENCOUNTER — Telehealth: Payer: Self-pay | Admitting: Hematology & Oncology

## 2011-12-17 NOTE — Telephone Encounter (Signed)
Talked to Patient she wants to go to [facility] for PT. Faxed referral to them

## 2011-12-20 ENCOUNTER — Other Ambulatory Visit: Payer: Self-pay | Admitting: *Deleted

## 2011-12-20 ENCOUNTER — Ambulatory Visit (HOSPITAL_BASED_OUTPATIENT_CLINIC_OR_DEPARTMENT_OTHER): Payer: Medicare Other | Admitting: Hematology & Oncology

## 2011-12-20 ENCOUNTER — Ambulatory Visit: Payer: Medicare Other | Admitting: Hematology & Oncology

## 2011-12-20 ENCOUNTER — Ambulatory Visit (HOSPITAL_BASED_OUTPATIENT_CLINIC_OR_DEPARTMENT_OTHER): Payer: Medicare Other

## 2011-12-20 VITALS — BP 106/59 | HR 88 | Temp 97.8°F

## 2011-12-20 DIAGNOSIS — D801 Nonfamilial hypogammaglobulinemia: Secondary | ICD-10-CM

## 2011-12-20 DIAGNOSIS — N289 Disorder of kidney and ureter, unspecified: Secondary | ICD-10-CM

## 2011-12-20 DIAGNOSIS — D631 Anemia in chronic kidney disease: Secondary | ICD-10-CM

## 2011-12-20 DIAGNOSIS — C9000 Multiple myeloma not having achieved remission: Secondary | ICD-10-CM

## 2011-12-20 DIAGNOSIS — Z452 Encounter for adjustment and management of vascular access device: Secondary | ICD-10-CM

## 2011-12-20 DIAGNOSIS — D509 Iron deficiency anemia, unspecified: Secondary | ICD-10-CM

## 2011-12-20 DIAGNOSIS — D649 Anemia, unspecified: Secondary | ICD-10-CM

## 2011-12-20 MED ORDER — DARBEPOETIN ALFA-POLYSORBATE 300 MCG/0.6ML IJ SOLN: 300.0000 ug | Freq: Once | INTRAMUSCULAR | Status: DC

## 2011-12-20 MED ORDER — LORAZEPAM 2 MG/ML IJ SOLN
0.5000 mg | Freq: Once | INTRAMUSCULAR | Status: AC
  Administered 2011-12-20: 0.5 mg via INTRAVENOUS

## 2011-12-20 MED ORDER — ALTEPLASE 2 MG IJ SOLR
2.0000 mg | Freq: Once | INTRAMUSCULAR | Status: AC
  Administered 2011-12-20: 2 mg
  Filled 2011-12-20: qty 2

## 2011-12-20 MED ORDER — ZOLEDRONIC ACID 4 MG/5ML IV CONC
3.3000 mg | Freq: Once | INTRAVENOUS | Status: AC
  Administered 2011-12-20: 3.3 mg via INTRAVENOUS
  Filled 2011-12-20: qty 4.13

## 2011-12-20 MED ORDER — IMMUNE GLOBULIN (HUMAN) 20 GM/200ML IV SOLN
40.0000 g | INTRAVENOUS | Status: DC
  Administered 2011-12-20: 40 g via INTRAVENOUS
  Filled 2011-12-20: qty 400

## 2011-12-20 MED ORDER — ONDANSETRON HCL 8 MG PO TABS: 8.0000 mg | ORAL_TABLET | Freq: Three times a day (TID) | ORAL | Status: DC | PRN

## 2011-12-20 NOTE — Telephone Encounter (Signed)
Pt c/o nausea after her needle was removed from her port. Didn't want anything additional given here. Requested something to be called in. [facility].

## 2011-12-21 NOTE — Progress Notes (Signed)
DIAGNOSES: 1. Kappa light chain myeloma. 2. 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 3. 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  CURRENT THERAPY: 1. The patient to start Pomalyst 4 mg p.o. daily. 2. Zometa 4 mg IV q.month. 3. IVIG 40 g q.4 weeks.  INTERVAL HISTORY:  [name] comes in for followup.  She has been on Kyprolis.  Actually she was on Kyprolis at [facility], and then we switched her back over to [place].  She lives in town.  We did go ahead and do a bone marrow biopsy on her.  We did this on 02/26.  The bone marrow report ([id number]) showed 13% plasma cells. There were some areas that did have up to 30% involvement.  I felt that this was indicative of progression.  She had been doing great on the Kyprolis.  She does feel tired.  She has been through a lot lately.  Her daughter just lost a child during her pregnancy.  I think she also lost I think some of her in-laws.  We got her back into physical therapy.  This is helping her.  She has a lot of leg stiffness from a rare reaction to radiation.  I talked to her at length about Pomalyst.  This is a new FDA approved oral drug for myeloma.  This essentially is second generation Revlimid.  PHYSICAL EXAMINATION:  General:  This is a well-developed, well- nourished white female in no obvious distress.  Vital signs:  Show a temperature of 97.8, pulse 88, respiratory rate 18, blood pressure 106/59.  Head and neck:  Exam shows a normocephalic, atraumatic skull. There are no ocular or oral lesions.  There are no palpable cervical or supraclavicular lymph nodes.  Lungs:  Clear bilaterally.  Cardiac: Regular rate and rhythm with normal S1, S2.  There are no murmurs, rubs or bruits.  Abdomen:  Soft with good bowel sounds.  There is no palpable abdominal mass.  There is no fluid wave.  There is no palpable hepatosplenomegaly.  Extremities:  Shows a lot of stiffness in her thighs bilaterally.  She has surgical  scars from past I think hip and femur surgery.  There may be some slight chronic edema in her legs. Skin:  Exam shows no rashes.  There is some dry skin.  Neurological: Exam shows no focal neurological deficit.  LABORATORY STUDIES:  White cell count 4.3, hemoglobin 9.1, hematocrit 27.2, platelet count 190.  IMPRESSION:  [name] is a 66 year old female with kappa light chain myeloma.  She actually has had myeloma now for about 20 years. Thankfully, she has responded well to therapy.  We now are just beginning to lose that response.  Again, I believe that Kyprolis would be a good idea for her.  We did go ahead and give her IVIG yesterday.  She also got Aranesp.  We will also gave her Zometa.  We will plan to get her back monthly.  Of note, she will take the Pomalyst 21 days on and 7 days off.  It is also recommended that patients get Decadron.  We will have to see about that with Ms. Ruthann Cancer.    ______________________________ Josph Macho, M.D. PRE/MEDQ  D:  12/21/2011  T:  12/21/2011  Job:  [id number]

## 2011-12-21 NOTE — Progress Notes (Signed)
This office note has been dictated.

## 2011-12-22 ENCOUNTER — Ambulatory Visit: Payer: Medicare Other

## 2011-12-22 ENCOUNTER — Ambulatory Visit (HOSPITAL_BASED_OUTPATIENT_CLINIC_OR_DEPARTMENT_OTHER): Payer: Medicare Other

## 2011-12-22 VITALS — BP 105/62 | HR 83 | Temp 98.2°F

## 2011-12-22 DIAGNOSIS — N189 Chronic kidney disease, unspecified: Secondary | ICD-10-CM

## 2011-12-22 DIAGNOSIS — D649 Anemia, unspecified: Secondary | ICD-10-CM

## 2011-12-22 DIAGNOSIS — N289 Disorder of kidney and ureter, unspecified: Secondary | ICD-10-CM

## 2011-12-22 MED ORDER — DARBEPOETIN ALFA-POLYSORBATE 300 MCG/0.6ML IJ SOLN
300.0000 ug | Freq: Once | INTRAMUSCULAR | Status: AC
  Administered 2011-12-22: 300 ug via SUBCUTANEOUS

## 2011-12-28 ENCOUNTER — Emergency Department (HOSPITAL_COMMUNITY): Payer: Medicare Other

## 2011-12-28 ENCOUNTER — Other Ambulatory Visit: Payer: Self-pay

## 2011-12-28 ENCOUNTER — Encounter (HOSPITAL_COMMUNITY): Payer: Self-pay | Admitting: *Deleted

## 2011-12-28 ENCOUNTER — Emergency Department (HOSPITAL_COMMUNITY)
Admission: EM | Admit: 2011-12-28 | Discharge: 2011-12-28 | Disposition: A | Discharge status: Home / Self Care | Payer: Medicare Other | Attending: Emergency Medicine | Admitting: Emergency Medicine

## 2011-12-28 DIAGNOSIS — E039 Hypothyroidism, unspecified: Secondary | ICD-10-CM | POA: Insufficient documentation

## 2011-12-28 DIAGNOSIS — N318 Other neuromuscular dysfunction of bladder: Secondary | ICD-10-CM | POA: Insufficient documentation

## 2011-12-28 DIAGNOSIS — J449 Chronic obstructive pulmonary disease, unspecified: Secondary | ICD-10-CM | POA: Insufficient documentation

## 2011-12-28 DIAGNOSIS — J4489 Other specified chronic obstructive pulmonary disease: Secondary | ICD-10-CM | POA: Insufficient documentation

## 2011-12-28 DIAGNOSIS — C9000 Multiple myeloma not having achieved remission: Secondary | ICD-10-CM | POA: Insufficient documentation

## 2011-12-28 DIAGNOSIS — M810 Age-related osteoporosis without current pathological fracture: Secondary | ICD-10-CM | POA: Insufficient documentation

## 2011-12-28 DIAGNOSIS — K219 Gastro-esophageal reflux disease without esophagitis: Secondary | ICD-10-CM | POA: Insufficient documentation

## 2011-12-28 DIAGNOSIS — G319 Degenerative disease of nervous system, unspecified: Secondary | ICD-10-CM | POA: Insufficient documentation

## 2011-12-28 DIAGNOSIS — R531 Weakness: Secondary | ICD-10-CM

## 2011-12-28 DIAGNOSIS — R5381 Other malaise: Secondary | ICD-10-CM | POA: Insufficient documentation

## 2011-12-28 LAB — URINE MICROSCOPIC-ADD ON

## 2011-12-28 LAB — COMPREHENSIVE METABOLIC PANEL
ALT: 20 U/L (ref 0–35)
AST: 22 U/L (ref 0–37)
Calcium: 11.1 mg/dL — ABNORMAL HIGH (ref 8.4–10.5)
Potassium: 3.5 mEq/L (ref 3.5–5.1)
Sodium: 141 mEq/L (ref 135–145)
Total Protein: 6.8 g/dL (ref 6.0–8.3)

## 2011-12-28 LAB — DIFFERENTIAL
Basophils Absolute: 0 10*3/uL (ref 0.0–0.1)
Eosinophils Absolute: 0.2 10*3/uL (ref 0.0–0.7)
Eosinophils Relative: 4 % (ref 0–5)
Lymphocytes Relative: 12 % (ref 12–46)
Neutrophils Relative %: 71 % (ref 43–77)

## 2011-12-28 LAB — CBC
MCH: 33 pg (ref 26.0–34.0)
MCV: 101.7 fL — ABNORMAL HIGH (ref 78.0–100.0)
Platelets: 176 10*3/uL (ref 150–400)
RDW: 18.9 % — ABNORMAL HIGH (ref 11.5–15.5)
WBC: 4 10*3/uL (ref 4.0–10.5)

## 2011-12-28 LAB — URINALYSIS, ROUTINE W REFLEX MICROSCOPIC
Bilirubin Urine: NEGATIVE
Glucose, UA: NEGATIVE mg/dL
Hgb urine dipstick: NEGATIVE
Ketones, ur: NEGATIVE mg/dL
Nitrite: NEGATIVE
Protein, ur: NEGATIVE mg/dL
Specific Gravity, Urine: 1.015 (ref 1.005–1.030)
Urobilinogen, UA: 0.2 mg/dL (ref 0.0–1.0)
pH: 6 (ref 5.0–8.0)

## 2011-12-28 MED ORDER — DULOXETINE HCL 30 MG PO CPEP: 30.0000 mg | ORAL_CAPSULE | Freq: Every day | ORAL | Status: DC

## 2011-12-28 MED ORDER — SODIUM CHLORIDE 0.9 % IJ SOLN
10.0000 mL | INTRAMUSCULAR | Status: DC | PRN
  Administered 2011-12-28: 10 mL

## 2011-12-28 MED ORDER — SODIUM CHLORIDE 0.9 % IJ SOLN: 10.0000 mL | Freq: Two times a day (BID) | INTRAMUSCULAR | Status: DC

## 2011-12-28 MED ORDER — HEPARIN SOD (PORK) LOCK FLUSH 100 UNIT/ML IV SOLN
500.0000 [IU] | INTRAVENOUS | Status: DC | PRN
  Administered 2011-12-28: 500 [IU]

## 2011-12-28 MED ORDER — HEPARIN SOD (PORK) LOCK FLUSH 100 UNIT/ML IV SOLN: 500.0000 [IU] | INTRAVENOUS | Status: DC

## 2011-12-28 MED ORDER — SODIUM CHLORIDE 0.9 % IV BOLUS (SEPSIS)
1000.0000 mL | Freq: Once | INTRAVENOUS | Status: AC
  Administered 2011-12-28: 1000 mL via INTRAVENOUS

## 2011-12-28 NOTE — Discharge Instructions (Signed)
Please read and follow all provided instructions.  Your diagnoses today include:  1. Weakness   2. Hypercalcemia     Tests performed today include:  Blood counts and electrolytes - showed mild elevation in calcium and creatinine  Head CT - no new changes  EKG  Urine test - no infection  Vital signs. See below for your results today.   Medications prescribed:   None   Home care instructions:  Follow any educational materials contained in this packet.  Follow-up instructions: Please follow-up with your primary care provider in the next 3 days for further evaluation of your symptoms. If you do not have a primary care doctor -- see below for referral information.   Return instructions:   Please return to the Emergency Department if you experience worsening symptoms.   Return with slurred speech, extremity weakness, persistent confusion, fever.   Please return if you have any other emergent concerns.  Additional Information:  Your vital signs today were: BP 104/54  Pulse 86  Temp(Src) 98.9 F (37.2 C) (Oral)  Resp 17  Ht 4\' 11"  (1.499 m)  Wt 152 lb (68.947 kg)  BMI 30.70 kg/m2  SpO2 97% If your blood pressure (BP) was elevated above 135/85 this visit, please have this repeated by your doctor within one month. -------------- No Primary Care Doctor Call [facility]  [phone] Other agencies that provide inexpensive medical care    [facility]  [phone]    [facility]  [phone]    [facility]  [phone]    [facility]  [phone]    [facility]  [phone]    [facility]  [phone] -------------- RESOURCE GUIDE:  Dental Problems  Patients with Medicaid: [facility] [phone] [address]                                            [phone] [address] Phone:  [phone]                                                   Phone:  [phone]  If unable to pay  or uninsured, contact:  [facility] or [facility]. to become qualified for the adult dental clinic.  Chronic Pain Problems Contact [facility]  [phone] Patients need to be referred by their primary care doctor.  Insufficient Money for Medicine Contact [employer]:  call "211" or [facility] [phone].  Psychological Services [facility]  [phone] [facility]  [phone] [facility]   [phone] (emergency services [phone])  Substance Abuse Resources [facility]  [phone] [facility] [phone] [facility] [phone] Floyd[facility] [phone] Residential & Outpatient Substance Abuse Program  [phone]  Abuse/Neglect [facility] Child Abuse Hotline 2603566297 [facility] Child Abuse Hotline [phone] (After Hours)  Emergency Shelter [facility] [phone]  Maternity Homes Room at the [place] [facility] [place] [phone] [place] Services [phone]  [facility] [place]  [place] Dept. [address]      [place] Phone:  [phone]                                   Phone:  [phone]                 Phone:  [place] Phone:  [place] [phone] (After Hours)

## 2011-12-28 NOTE — ED Provider Notes (Signed)
History     CSN: [id number]  Arrival date & time 12/28/11  1136   First MD Initiated Contact with Patient 12/28/11 1235      Chief Complaint  Patient presents with  . Weakness    (Consider location/radiation/quality/duration/timing/severity/associated sxs/prior treatment) HPI Comments: Patient with a history of multiple myeloma currently not taking chemotherapy however is due to restart-presents with family with complaint of worsening weakness and memory problems for the past several weeks. Family states these have been becoming more pronounced and will gradually worsening. Family states they were urged to bring the patient to the emergency department for evaluation of a stroke. No fever, upper respiratory symptoms, chest pain, shortness of breath, abdominal pain, extremity weakness, urinary symptoms.  Patient is a 66 y.o. female presenting with weakness. The history is provided by the patient and a relative.  Weakness The primary symptoms include memory loss. Primary symptoms do not include headaches, loss of consciousness, seizures, dizziness, visual change, paresthesias, focal weakness, loss of sensation, speech change, fever, nausea or vomiting. The symptoms began more than 1 week ago. The symptoms are worsening.  Additional symptoms include weakness. Additional symptoms do not include neck stiffness, pain or loss of balance.    Past Medical History  Diagnosis Date  . Myeloma   . Anxiety   . Hypothyroidism   . GERD (gastroesophageal reflux disease)   . Myeloma   . OAB (overactive bladder)   . COPD (chronic obstructive pulmonary disease)   . Osteoporosis   . Depression   . Chronic pain   . Renal insufficiency     Past Surgical History  Procedure Date  . Leg surgery   . Cholecystectomy   . Tonsillectomy   . Breast surgery   . Limbal stem cell transplant   . Abdominal hysterectomy     No family history on file.  History  Substance Use Topics  . Smoking status:  Never Smoker   . Smokeless tobacco: Never Used  . Alcohol Use: No    OB History    Grav Para Term Preterm Abortions TAB SAB Ect Mult Living                  Review of Systems  Constitutional: Negative for fever.  HENT: Negative for sore throat, rhinorrhea, neck pain and neck stiffness.   Eyes: Negative for redness.  Respiratory: Negative for cough.   Cardiovascular: Negative for chest pain.  Gastrointestinal: Negative for nausea, vomiting, abdominal pain and diarrhea.  Genitourinary: Negative for dysuria.  Musculoskeletal: Negative for myalgias.  Skin: Negative for rash.  Neurological: Positive for weakness. Negative for dizziness, speech change, focal weakness, seizures, loss of consciousness, syncope, facial asymmetry, speech difficulty, light-headedness, numbness, headaches, paresthesias and loss of balance.  Psychiatric/Behavioral: Positive for memory loss and confusion.    Allergies  Hydromorphone; Morphine and related; and Penicillins  Home Medications   Current Outpatient Rx  Name Route Sig Dispense Refill  . ACYCLOVIR 400 MG PO TABS Oral Take 800 mg by mouth 2 (two) times daily.     Marland Kitchen ALPRAZOLAM 0.25 MG PO TABS Oral Take 1 tablet (0.25 mg total) by mouth 3 (three) times daily as needed for sleep. 30 tablet 3  . ASPIRIN EC 81 MG PO TBEC Oral Take 81 mg by mouth at bedtime.    Marland Kitchen BENZONATATE 100 MG PO CAPS  TAKE ONE CAPSULE EVERY 12 HOURS AS NEEDED FOR COUGH 60 capsule 1  . CELECOXIB 100 MG PO CAPS Oral Take 100 mg  by mouth 2 (two) times daily.      Marland Kitchen DIAZEPAM 5 MG PO TABS Oral Take 1 tablet (5 mg total) by mouth every 6 (six) hours as needed. Muscle spasm 30 tablet 1  . DIPHENOXYLATE-ATROPINE 2.5-0.025 MG PO TABS Oral Take 1 tablet by mouth 4 (four) times daily as needed. Diarrhea 90 tablet 1  . DULOXETINE HCL 30 MG PO CPEP Oral Take 30 mg by mouth daily.      Marland Kitchen ESOMEPRAZOLE MAGNESIUM 40 MG PO CPDR Oral Take 40 mg by mouth daily before breakfast.      .  FLUTICASONE-SALMETEROL 250-50 MCG/DOSE IN AEPB Inhalation Inhale 1 puff into the lungs every 12 (twelve) hours.    Marland Kitchen LEVOTHYROXINE SODIUM 50 MCG PO TABS Oral Take 50 mcg by mouth at bedtime.     Marland Kitchen LORAZEPAM 1 MG PO TABS Oral Take 1 mg by mouth every 8 (eight) hours as needed. Nausea     . OLOPATADINE HCL 0.1 % OP SOLN Both Eyes Place 1 drop into both eyes 2 (two) times daily.     Marland Kitchen PIRBUTEROL ACETATE 200 MCG/INH IN AERB Inhalation Inhale 2 puffs into the lungs 4 (four) times daily as needed. Wheezing     . TOLTERODINE TARTRATE ER 4 MG PO CP24 Oral Take 4 mg by mouth daily.      . TORSEMIDE 20 MG PO TABS Oral Take 20 mg by mouth daily as needed. Fluid     . TRAMADOL HCL 50 MG PO TABS Oral Take 50 mg by mouth every 6 (six) hours as needed. Pain    . VITAMIN D (ERGOCALCIFEROL) 50000 UNITS PO CAPS Oral Take 50,000 Units by mouth every 7 (seven) days. Take on Monday     . ZOLPIDEM TARTRATE 5 MG PO TABS Oral Take 5 mg by mouth at bedtime.      BP 115/60  Pulse 94  Temp(Src) 98.9 F (37.2 C) (Oral)  Resp 16  Ht 4\' 11"  (1.499 m)  Wt 152 lb (68.947 kg)  BMI 30.70 kg/m2  SpO2 97%  Physical Exam  Nursing note and vitals reviewed. Constitutional: She is oriented to person, place, and time. She appears well-developed and well-nourished.  HENT:  Head: Normocephalic and atraumatic.  Eyes: Conjunctivae are normal. Pupils are equal, round, and reactive to light. Right eye exhibits no discharge. Left eye exhibits no discharge.  Neck: Normal range of motion. Neck supple.  Cardiovascular: Normal rate, regular rhythm and normal heart sounds.   Pulmonary/Chest: Effort normal and breath sounds normal. No respiratory distress.  Abdominal: Soft. There is no tenderness. A hernia is present. Hernia confirmed positive in the ventral area.       Hernia is nontender to palpation  Musculoskeletal: She exhibits no edema and no tenderness.  Neurological: She is alert and oriented to person, place, and time. She  has normal strength and normal reflexes. She displays normal reflexes. No cranial nerve deficit or sensory deficit. She exhibits normal muscle tone. She displays a negative Romberg sign. Coordination normal.  Skin: Skin is warm and dry.  Psychiatric: She has a normal mood and affect.    ED Course  Procedures (including critical care time)  Labs Reviewed  CBC - Abnormal; Notable for the following:    RBC 2.97 (*)    Hemoglobin 9.8 (*)    HCT 30.2 (*)    MCV 101.7 (*)    RDW 18.9 (*)    All other components within normal limits  DIFFERENTIAL - Abnormal;  Notable for the following:    Lymphs Abs 0.5 (*)    Monocytes Relative 13 (*)    All other components within normal limits  COMPREHENSIVE METABOLIC PANEL - Abnormal; Notable for the following:    Glucose, Bld 124 (*)    Creatinine, Ser 1.39 (*)    Calcium 11.1 (*)    GFR calc non Af Amer 39 (*)    GFR calc Af Amer 45 (*)    All other components within normal limits  URINALYSIS, ROUTINE W REFLEX MICROSCOPIC - Abnormal; Notable for the following:    Leukocytes, UA TRACE (*)    All other components within normal limits  URINE MICROSCOPIC-ADD ON - Abnormal; Notable for the following:    Squamous Epithelial / LPF FEW (*)    All other components within normal limits   Ct Head Wo Contrast  12/28/2011  *RADIOLOGY REPORT*  Clinical Data: Weakness, lethargy, multiple myeloma  CT HEAD WITHOUT CONTRAST  Technique:  Contiguous axial images were obtained from the base of the skull through the vertex without contrast.  Comparison: 06/15/2011  Findings: Stable diffuse brain atrophy.  No acute intracranial hemorrhage, infarction, mass lesion, midline shift, herniation, or extra-axial fluid collection.  Gray-white matter differentiation maintained.  Cisterns patent.  Cerebellar atrophy as well. Symmetric orbits.  Postop changes of the maxillary sinuses. Sinuses clear.  Mastoids clear.  Lytic skull lesions noted compatible with history of myeloma.   IMPRESSION: Stable brain atrophy.  No acute intracranial process by noncontrast CT.  Skull lytic lesions compatible with myeloma.  Original Report Authenticated By: Judie Petit. Ruel Favors, M.D.     1. Weakness   2. Hypercalcemia     12:47 PM Patient seen and examined. Work-up initiated.   Vital signs reviewed and are as follows: Filed Vitals:   12/28/11 1143  BP: 115/60  Pulse: 94  Temp: 98.9 F (37.2 C)  Resp: 16    Date: 12/28/2011  Rate: 86  Rhythm: normal sinus rhythm  QRS Axis: normal  Intervals: normal  ST/T Wave abnormalities: normal  Conduction Disutrbances:left anterior fascicular block  Narrative Interpretation:   Old EKG Reviewed: changes noted from 06/15/2011, new LA fascicular block  3:20 PM Results d/w Dr. Oletta Lamas. Positive findings: slightly elevated creatinine and elevated calcium.   All findings reviewed with patient and family. Will give fluid bolus and discharge to home. Patient is to follow-up with PCP regarding findings for further eval. They verbalize understanding and agree with plan.   3:58 PM Discussed results with patient's daughter.  4:12 PM Cymbalta 30mg  tab #14 written per daughter's request.   MDM  Patient with vague symptoms of weakness and abnormal memory/behavior change in setting of myeloma, anemia. Work-up is largely unconcerning. EKG normal. Head CT neg. Hypercalcemia and mild renal insufficiency. Fluids given. Pt will need PCP and oncology follow-up.         Renne Cr[place], [place] 12/28/11 1600  [place], [place] 12/28/11 [phone]

## 2011-12-28 NOTE — ED Notes (Signed)
IV team member at bedside for de-access of port a cath

## 2011-12-28 NOTE — ED Notes (Signed)
Pt assisted to BR without any problems.

## 2011-12-28 NOTE — ED Notes (Signed)
Patient is resting comfortably. Family member requesting to speak to MD.  PA made aware.

## 2011-12-28 NOTE — ED Notes (Signed)
Patient with hx of multiple myeloma.  Patient has increased weakness and lethargy.  Her md is concerned that she may have had a stroke.  Patient with no one sided deficits

## 2011-12-28 NOTE — ED Notes (Signed)
IV team member at bedsdie.

## 2011-12-28 NOTE — ED Notes (Signed)
Iv team paged for port a cath access

## 2011-12-29 NOTE — ED Provider Notes (Signed)
Medical screening examination/treatment/procedure(s) were performed by non-physician practitioner and as supervising physician I was immediately available for consultation/collaboration.   [name]. Oletta Lamas, MD 12/29/11 [id number]

## 2011-12-30 ENCOUNTER — Telehealth: Payer: Self-pay | Admitting: *Deleted

## 2011-12-30 ENCOUNTER — Other Ambulatory Visit: Payer: Self-pay | Admitting: Hematology & Oncology

## 2011-12-30 ENCOUNTER — Ambulatory Visit: Payer: Medicare Other | Attending: Hematology & Oncology

## 2011-12-30 DIAGNOSIS — M6281 Muscle weakness (generalized): Secondary | ICD-10-CM | POA: Insufficient documentation

## 2011-12-30 DIAGNOSIS — C9000 Multiple myeloma not having achieved remission: Secondary | ICD-10-CM

## 2011-12-30 DIAGNOSIS — R5381 Other malaise: Secondary | ICD-10-CM | POA: Insufficient documentation

## 2011-12-30 DIAGNOSIS — R5383 Other fatigue: Secondary | ICD-10-CM

## 2011-12-30 DIAGNOSIS — IMO0001 Reserved for inherently not codable concepts without codable children: Secondary | ICD-10-CM | POA: Insufficient documentation

## 2011-12-30 MED ORDER — HYDROCODONE-ACETAMINOPHEN 5-325 MG PO TABS: ORAL_TABLET | ORAL | Status: DC

## 2011-12-30 NOTE — Telephone Encounter (Addendum)
Received a call from pt's dgtr Victorino Dike. She is very concerned about her mother's condition and wanted to know if she could speak to Dr Myna Hidalgo. 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 Message given to Dr Myna Hidalgo.

## 2011-12-31 ENCOUNTER — Other Ambulatory Visit: Payer: Self-pay | Admitting: Hematology & Oncology

## 2011-12-31 ENCOUNTER — Encounter (HOSPITAL_COMMUNITY)
Admission: RE | Admit: 2011-12-31 | Discharge: 2011-12-31 | Disposition: A | Discharge status: Home / Self Care | Payer: Medicare Other | Source: Ambulatory Visit | Attending: Hematology & Oncology | Admitting: Hematology & Oncology

## 2011-12-31 ENCOUNTER — Other Ambulatory Visit (HOSPITAL_BASED_OUTPATIENT_CLINIC_OR_DEPARTMENT_OTHER): Payer: Medicare Other | Admitting: Lab

## 2011-12-31 ENCOUNTER — Other Ambulatory Visit: Payer: Medicare Other | Admitting: Lab

## 2011-12-31 ENCOUNTER — Telehealth: Payer: Self-pay | Admitting: Hematology & Oncology

## 2011-12-31 DIAGNOSIS — D6481 Anemia due to antineoplastic chemotherapy: Secondary | ICD-10-CM | POA: Insufficient documentation

## 2011-12-31 DIAGNOSIS — C9000 Multiple myeloma not having achieved remission: Secondary | ICD-10-CM

## 2011-12-31 DIAGNOSIS — T451X5A Adverse effect of antineoplastic and immunosuppressive drugs, initial encounter: Secondary | ICD-10-CM

## 2011-12-31 DIAGNOSIS — D631 Anemia in chronic kidney disease: Secondary | ICD-10-CM

## 2011-12-31 DIAGNOSIS — D509 Iron deficiency anemia, unspecified: Secondary | ICD-10-CM

## 2011-12-31 DIAGNOSIS — R5381 Other malaise: Secondary | ICD-10-CM

## 2011-12-31 DIAGNOSIS — R5383 Other fatigue: Secondary | ICD-10-CM

## 2011-12-31 LAB — BASIC METABOLIC PANEL
BUN: 16 mg/dL (ref 6–23)
Calcium: 9.9 mg/dL (ref 8.4–10.5)
Glucose, Bld: 137 mg/dL — ABNORMAL HIGH (ref 70–99)
Potassium: 3.9 mEq/L (ref 3.5–5.3)
Sodium: 140 mEq/L (ref 135–145)

## 2011-12-31 LAB — CBC WITH DIFFERENTIAL (CANCER CENTER ONLY)
BASO%: 0.2 % (ref 0.0–2.0)
EOS%: 4.1 % (ref 0.0–7.0)
LYMPH#: 0.6 10*3/uL — ABNORMAL LOW (ref 0.9–3.3)
MCHC: 32.2 g/dL (ref 32.0–36.0)
MONO#: 0.4 10*3/uL (ref 0.1–0.9)
NEUT#: 4.6 10*3/uL (ref 1.5–6.5)
Platelets: 165 10*3/uL (ref 145–400)
RDW: 18.7 % — ABNORMAL HIGH (ref 11.1–15.7)
WBC: 5.8 10*3/uL (ref 3.9–10.0)

## 2011-12-31 LAB — IRON AND TIBC
%SAT: 15 % — ABNORMAL LOW (ref 20–55)
TIBC: 217 ug/dL — ABNORMAL LOW (ref 250–470)
UIBC: 184 ug/dL (ref 125–400)

## 2011-12-31 LAB — TSH: TSH: 3.057 u[IU]/mL (ref 0.350–4.500)

## 2011-12-31 LAB — FERRITIN: Ferritin: 1585 ng/mL — ABNORMAL HIGH (ref 10–291)

## 2011-12-31 NOTE — Telephone Encounter (Signed)
Pt aware of 3-15 lab

## 2012-01-03 ENCOUNTER — Ambulatory Visit (HOSPITAL_BASED_OUTPATIENT_CLINIC_OR_DEPARTMENT_OTHER): Payer: Medicare Other

## 2012-01-03 ENCOUNTER — Ambulatory Visit: Payer: Medicare Other

## 2012-01-03 VITALS — BP 116/67 | HR 78 | Temp 97.5°F | Resp 16

## 2012-01-03 DIAGNOSIS — C9000 Multiple myeloma not having achieved remission: Secondary | ICD-10-CM

## 2012-01-03 DIAGNOSIS — N189 Chronic kidney disease, unspecified: Secondary | ICD-10-CM

## 2012-01-03 DIAGNOSIS — Z452 Encounter for adjustment and management of vascular access device: Secondary | ICD-10-CM

## 2012-01-03 DIAGNOSIS — D6481 Anemia due to antineoplastic chemotherapy: Secondary | ICD-10-CM

## 2012-01-03 LAB — PREPARE RBC (CROSSMATCH)

## 2012-01-03 MED ORDER — ALTEPLASE 2 MG IJ SOLR
2.0000 mg | Freq: Once | INTRAMUSCULAR | Status: AC
  Administered 2012-01-03: 2 mg
  Filled 2012-01-03: qty 2

## 2012-01-03 MED ORDER — FUROSEMIDE 10 MG/ML IJ SOLN
20.0000 mg | Freq: Once | INTRAMUSCULAR | Status: AC
  Administered 2012-01-03: 20 mg via INTRAVENOUS

## 2012-01-03 MED ORDER — ACETAMINOPHEN 325 MG PO TABS
650.0000 mg | ORAL_TABLET | Freq: Once | ORAL | Status: AC
  Administered 2012-01-03: 650 mg via ORAL

## 2012-01-03 MED ORDER — SODIUM CHLORIDE 0.9 % IV SOLN
250.0000 mL | Freq: Once | INTRAVENOUS | Status: AC
  Administered 2012-01-03: 250 mL via INTRAVENOUS

## 2012-01-03 MED ORDER — HEPARIN SOD (PORK) LOCK FLUSH 100 UNIT/ML IV SOLN
500.0000 [IU] | INTRAVENOUS | Status: AC | PRN
Start: 2012-01-03 — End: 2012-01-03
  Administered 2012-01-03: 500 [IU]
  Filled 2012-01-03: qty 5

## 2012-01-04 ENCOUNTER — Ambulatory Visit: Payer: Medicare Other

## 2012-01-04 LAB — TYPE AND SCREEN
ABO/RH(D): A NEG
Antibody Screen: NEGATIVE
Unit division: 0
Unit division: 0

## 2012-01-05 ENCOUNTER — Ambulatory Visit: Payer: Medicare Other

## 2012-01-05 ENCOUNTER — Other Ambulatory Visit: Payer: Medicare Other | Admitting: Lab

## 2012-01-05 ENCOUNTER — Ambulatory Visit: Payer: Medicare Other | Admitting: Hematology & Oncology

## 2012-01-06 ENCOUNTER — Ambulatory Visit: Payer: Medicare Other

## 2012-01-07 ENCOUNTER — Telehealth: Payer: Self-pay | Admitting: *Deleted

## 2012-01-07 NOTE — Telephone Encounter (Signed)
Message copied by Mirian Capuchin on Fri Jan 07, 2012  5:23 PM ------      Message from: Josph Macho      Created: Tue Jan 04, 2012  9:25 PM       Call - iron is low.  Need 510mg  feraheme q wk x 2.  Start next week. pete

## 2012-01-07 NOTE — Telephone Encounter (Signed)
Pt called and given Dr. Gustavo Lah message about her iron.  Pt will plan to have Feraheme with her next two Monday chemo txs.

## 2012-01-10 ENCOUNTER — Encounter: Payer: Medicare Other | Admitting: Physical Therapy

## 2012-01-11 ENCOUNTER — Other Ambulatory Visit: Payer: Medicare Other | Admitting: Lab

## 2012-01-11 ENCOUNTER — Ambulatory Visit: Payer: Medicare Other

## 2012-01-12 ENCOUNTER — Ambulatory Visit: Payer: Medicare Other

## 2012-01-12 ENCOUNTER — Ambulatory Visit: Payer: Medicare Other | Admitting: Physical Therapy

## 2012-01-13 ENCOUNTER — Other Ambulatory Visit: Payer: Self-pay | Admitting: *Deleted

## 2012-01-13 ENCOUNTER — Other Ambulatory Visit: Payer: Self-pay | Admitting: Hematology & Oncology

## 2012-01-13 ENCOUNTER — Ambulatory Visit (HOSPITAL_BASED_OUTPATIENT_CLINIC_OR_DEPARTMENT_OTHER)
Admission: RE | Admit: 2012-01-13 | Discharge: 2012-01-13 | Disposition: A | Discharge status: Home / Self Care | Payer: Medicare Other | Source: Ambulatory Visit | Attending: Hematology & Oncology | Admitting: Hematology & Oncology

## 2012-01-13 ENCOUNTER — Other Ambulatory Visit: Payer: Medicare Other | Admitting: Lab

## 2012-01-13 DIAGNOSIS — R059 Cough, unspecified: Secondary | ICD-10-CM | POA: Insufficient documentation

## 2012-01-13 DIAGNOSIS — C9000 Multiple myeloma not having achieved remission: Secondary | ICD-10-CM

## 2012-01-13 DIAGNOSIS — R0602 Shortness of breath: Secondary | ICD-10-CM | POA: Insufficient documentation

## 2012-01-13 DIAGNOSIS — R05 Cough: Secondary | ICD-10-CM

## 2012-01-13 DIAGNOSIS — J069 Acute upper respiratory infection, unspecified: Secondary | ICD-10-CM

## 2012-01-13 DIAGNOSIS — M8448XA Pathological fracture, other site, initial encounter for fracture: Secondary | ICD-10-CM

## 2012-01-13 LAB — CBC WITH DIFFERENTIAL (CANCER CENTER ONLY)
BASO%: 0.2 % (ref 0.0–2.0)
Eosinophils Absolute: 0.1 10*3/uL (ref 0.0–0.5)
HCT: 32 % — ABNORMAL LOW (ref 34.8–46.6)
LYMPH#: 0.3 10*3/uL — ABNORMAL LOW (ref 0.9–3.3)
LYMPH%: 6.4 % — ABNORMAL LOW (ref 14.0–48.0)
MCV: 100 fL (ref 81–101)
MONO#: 0.5 10*3/uL (ref 0.1–0.9)
Platelets: 158 10*3/uL (ref 145–400)
RBC: 3.19 10*6/uL — ABNORMAL LOW (ref 3.70–5.32)
RDW: 18 % — ABNORMAL HIGH (ref 11.1–15.7)
WBC: 5 10*3/uL (ref 3.9–10.0)

## 2012-01-13 MED ORDER — METHYLPREDNISOLONE (PAK) 4 MG PO TABS: ORAL_TABLET | ORAL | Status: AC

## 2012-01-13 MED ORDER — AZITHROMYCIN 250 MG PO TABS: ORAL_TABLET | ORAL | Status: AC

## 2012-01-13 MED ORDER — BENZONATATE 100 MG PO CAPS: 100.0000 mg | ORAL_CAPSULE | Freq: Three times a day (TID) | ORAL | Status: AC | PRN

## 2012-01-14 ENCOUNTER — Other Ambulatory Visit: Payer: Self-pay | Admitting: *Deleted

## 2012-01-14 ENCOUNTER — Telehealth: Payer: Self-pay | Admitting: Hematology & Oncology

## 2012-01-14 NOTE — Progress Notes (Signed)
Received a call from [place] (on behalf of her son) asking for a family meeting with Dr Myna Hidalgo so they can "all get on the same page". She stated they are at a point where they need to come together to help Ms. Idler with her living arrangements. Tamra is staying with them this weekend but they are interested in knowing what options are out there for her. Made an appt to see Dr Myna Hidalgo on Wed, April 3 @ 4:30 and contacted Cammy Copa (Child psychotherapist). She is going to contact Vivianna prior to their visit with Dr Myna Hidalgo to discuss their options.

## 2012-01-14 NOTE — Telephone Encounter (Signed)
Per RN and MD I canceled 4[facility]3 rehab appointment and left them a message because they close early on Friday's. I scheduled 01-19-12 MD at 430 pm family is aware of appointment

## 2012-01-17 ENCOUNTER — Encounter: Payer: Medicare Other | Admitting: Physical Therapy

## 2012-01-17 ENCOUNTER — Other Ambulatory Visit: Payer: Medicare Other | Admitting: Lab

## 2012-01-17 ENCOUNTER — Ambulatory Visit (HOSPITAL_BASED_OUTPATIENT_CLINIC_OR_DEPARTMENT_OTHER): Payer: Medicare Other

## 2012-01-17 VITALS — BP 123/67 | HR 67 | Temp 97.7°F

## 2012-01-17 DIAGNOSIS — D631 Anemia in chronic kidney disease: Secondary | ICD-10-CM

## 2012-01-17 DIAGNOSIS — D801 Nonfamilial hypogammaglobulinemia: Secondary | ICD-10-CM

## 2012-01-17 DIAGNOSIS — Z452 Encounter for adjustment and management of vascular access device: Secondary | ICD-10-CM

## 2012-01-17 DIAGNOSIS — C9000 Multiple myeloma not having achieved remission: Secondary | ICD-10-CM

## 2012-01-17 LAB — COMPREHENSIVE METABOLIC PANEL
Albumin: 4.1 g/dL (ref 3.5–5.2)
Alkaline Phosphatase: 47 U/L (ref 39–117)
BUN: 26 mg/dL — ABNORMAL HIGH (ref 6–23)
Glucose, Bld: 119 mg/dL — ABNORMAL HIGH (ref 70–99)
Total Bilirubin: 0.5 mg/dL (ref 0.3–1.2)

## 2012-01-17 LAB — CBC WITH DIFFERENTIAL (CANCER CENTER ONLY)
BASO#: 0 10*3/uL (ref 0.0–0.2)
Eosinophils Absolute: 0.4 10*3/uL (ref 0.0–0.5)
HCT: 34.9 % (ref 34.8–46.6)
HGB: 11.4 g/dL — ABNORMAL LOW (ref 11.6–15.9)
LYMPH%: 16.4 % (ref 14.0–48.0)
MCH: 32.9 pg (ref 26.0–34.0)
MCV: 101 fL (ref 81–101)
MONO%: 15.9 % — ABNORMAL HIGH (ref 0.0–13.0)
RBC: 3.46 10*6/uL — ABNORMAL LOW (ref 3.70–5.32)

## 2012-01-17 MED ORDER — ZOLEDRONIC ACID 4 MG/5ML IV CONC
3.3000 mg | Freq: Once | INTRAVENOUS | Status: AC
  Administered 2012-01-17: 3.3 mg via INTRAVENOUS
  Filled 2012-01-17: qty 4.13

## 2012-01-17 MED ORDER — ALTEPLASE 2 MG IJ SOLR
2.0000 mg | Freq: Once | INTRAMUSCULAR | Status: AC
  Administered 2012-01-17: 2 mg
  Filled 2012-01-17: qty 2

## 2012-01-17 MED ORDER — IMMUNE GLOBULIN (HUMAN) 20 GM/200ML IV SOLN
40.0000 g | INTRAVENOUS | Status: DC
  Administered 2012-01-17: 40 g via INTRAVENOUS
  Filled 2012-01-17: qty 400

## 2012-01-17 MED ORDER — HEPARIN SOD (PORK) LOCK FLUSH 100 UNIT/ML IV SOLN
500.0000 [IU] | Freq: Once | INTRAVENOUS | Status: AC
  Administered 2012-01-17: 500 [IU] via INTRAVENOUS
  Filled 2012-01-17: qty 5

## 2012-01-17 MED ORDER — SODIUM CHLORIDE 0.9 % IJ SOLN
10.0000 mL | INTRAMUSCULAR | Status: DC | PRN
  Administered 2012-01-17: 10 mL via INTRAVENOUS
  Filled 2012-01-17: qty 10

## 2012-01-17 NOTE — Patient Instructions (Signed)
Immune Globulin Injection What is this medicine? IMMUNE GLOBULIN (im MUNE GLOB yoo lin) helps to prevent or reduce the severity of certain infections in patients who are at risk. This medicine is collected from the pooled blood of many donors. It is used to treat immune system problems, thrombocytopenia, and Kawasaki syndrome. This medicine may be used for other purposes; ask your health care provider or pharmacist if you have questions. What should I tell my health care provider before I take this medicine? They need to know if you have any of these conditions: - diabetes - extremely low or no immune antibodies in the blood - heart disease - history of blood clots - hyperprolinemia - infection in the blood, sepsis - kidney disease - taking medicine that may change kidney function - ask your health care provider about your medicine - an unusual or allergic reaction to human immune globulin, albumin, maltose, sucrose, polysorbate 80, other medicines, foods, dyes, or preservatives - pregnant or trying to get pregnant - breast-feeding How should I use this medicine? This medicine is for injection into a muscle or infusion into a vein or skin. It is usually given by a health care professional in a hospital or clinic setting. In rare cases, some brands of this medicine might be given at home. You will be taught how to give this medicine. Use exactly as directed. Take your medicine at regular intervals. Do not take your medicine more often than directed. Talk to your pediatrician regarding the use of this medicine in children. Special care may be needed. Overdosage: If you think you have taken too much of this medicine contact a poison control center or emergency room at once. NOTE: This medicine is only for you. Do not share this medicine with others. What if I miss a dose? It is important not to miss your dose. Call your doctor or health care professional if you are unable to keep an  appointment. If you give yourself the medicine and you miss a dose, take it as soon as you can. If it is almost time for your next dose, take only that dose. Do not take double or extra doses. What may interact with this medicine? -aspirin and aspirin-like medicines -cisplatin -cyclosporine -medicines for infection like acyclovir, adefovir, amphotericin B, bacitracin, cidofovir, foscarnet, ganciclovir, gentamicin, pentamidine, vancomycin -NSAIDS, medicines for pain and inflammation, like ibuprofen or naproxen -pamidronate -vaccines -zoledronic acid This list may not describe all possible interactions. Give your health care provider a list of all the medicines, herbs, non-prescription drugs, or dietary supplements you use. Also tell them if you smoke, drink alcohol, or use illegal drugs. Some items may interact with your medicine. What should I watch for while using this medicine? Your condition will be monitored carefully while you are receiving this medicine. This medicine is made from pooled blood donations of many different people. It may be possible to pass an infection in this medicine. However, the donors are screened for infections and all products are tested for HIV and hepatitis. The medicine is treated to kill most or all bacteria and viruses. Talk to your doctor about the risks and benefits of this medicine. Do not have vaccinations for at least 14 days before, or until at least 3 months after receiving this medicine. What side effects may I notice from receiving this medicine? Side effects that you should report to your doctor or health care professional as soon as possible: -allergic reactions like skin rash, itching or hives, swelling of   the face, lips, or tongue -breathing problems -chest pain or tightness -fever, chills -headache with nausea, vomiting -neck pain or difficulty moving neck -pain when moving eyes -pain, swelling, warmth in the leg -problems with balance,  talking, walking -sudden weight gain -swelling of the ankles, feet, hands -trouble passing urine or change in the amount of urine Side effects that usually do not require medical attention (report to your doctor or health care professional if they continue or are bothersome): -dizzy, drowsy -flushing -increased sweating -leg cramps -muscle aches and pains -pain at site where injected This list may not describe all possible side effects. Call your doctor for medical advice about side effects. You may report side effects to FDA at 1-800-FDA-1088. Where should I keep my medicine? Keep out of the reach of children. This drug is usually given in a hospital or clinic and will not be stored at home. In rare cases, some brands of this medicine may be given at home. If you are using this medicine at home, you will be instructed on how to store this medicine. Throw away any unused medicine after the expiration date on the label. NOTE: This sheet is a summary. It may not cover all possible information. If you have questions about this medicine, talk to your doctor, pharmacist, or health care provider.  2012, Elsevier/Gold Standard. (12/25/2008 11:44:49 AM)Zoledronic Acid injection (Hypercalcemia, Oncology) What is this medicine? ZOLEDRONIC ACID (ZOE le dron ik AS id) lowers the amount of calcium loss from bone. It is used to treat too much calcium in your blood from cancer. It is also used to prevent complications of cancer that has spread to the bone. This medicine may be used for other purposes; ask your health care provider or pharmacist if you have questions. What should I tell my health care provider before I take this medicine? They need to know if you have any of these conditions: -aspirin-sensitive asthma -dental disease -kidney disease -an unusual or allergic reaction to zoledronic acid, other medicines, foods, dyes, or preservatives -pregnant or trying to get pregnant -breast-feeding How  should I use this medicine? This medicine is for infusion into a vein. It is given by a health care professional in a hospital or clinic setting. Talk to your pediatrician regarding the use of this medicine in children. Special care may be needed. Overdosage: If you think you have taken too much of this medicine contact a poison control center or emergency room at once. NOTE: This medicine is only for you. Do not share this medicine with others. What if I miss a dose? It is important not to miss your dose. Call your doctor or health care professional if you are unable to keep an appointment. What may interact with this medicine? -certain antibiotics given by injection -NSAIDs, medicines for pain and inflammation, like ibuprofen or naproxen -some diuretics like bumetanide, furosemide -teriparatide -thalidomide This list may not describe all possible interactions. Give your health care provider a list of all the medicines, herbs, non-prescription drugs, or dietary supplements you use. Also tell them if you smoke, drink alcohol, or use illegal drugs. Some items may interact with your medicine. What should I watch for while using this medicine? Visit your doctor or health care professional for regular checkups. It may be some time before you see the benefit from this medicine. Do not stop taking your medicine unless your doctor tells you to. Your doctor may order blood tests or other tests to see how you are doing. Women   should inform their doctor if they wish to become pregnant or think they might be pregnant. There is a potential for serious side effects to an unborn child. Talk to your health care professional or pharmacist for more information. You should make sure that you get enough calcium and vitamin D while you are taking this medicine. Discuss the foods you eat and the vitamins you take with your health care professional. Some people who take this medicine have severe bone, joint, and/or  muscle pain. This medicine may also increase your risk for a broken thigh bone. Tell your doctor right away if you have pain in your upper leg or groin. Tell your doctor if you have any pain that does not go away or that gets worse. What side effects may I notice from receiving this medicine? Side effects that you should report to your doctor or health care professional as soon as possible: -allergic reactions like skin rash, itching or hives, swelling of the face, lips, or tongue -anxiety, confusion, or depression -breathing problems -changes in vision -feeling faint or lightheaded, falls -jaw burning, cramping, pain -muscle cramps, stiffness, or weakness -trouble passing urine or change in the amount of urine Side effects that usually do not require medical attention (report to your doctor or health care professional if they continue or are bothersome): -bone, joint, or muscle pain -fever -hair loss -irritation at site where injected -loss of appetite -nausea, vomiting -stomach upset -tired This list may not describe all possible side effects. Call your doctor for medical advice about side effects. You may report side effects to FDA at 1-800-FDA-1088. Where should I keep my medicine? This drug is given in a hospital or clinic and will not be stored at home. NOTE: This sheet is a summary. It may not cover all possible information. If you have questions about this medicine, talk to your doctor, pharmacist, or health care provider.  2012, Elsevier/Gold Standard. (04/02/2011 9:06:58 AM) 

## 2012-01-18 ENCOUNTER — Encounter: Payer: Self-pay | Admitting: *Deleted

## 2012-01-18 ENCOUNTER — Telehealth: Payer: Self-pay | Admitting: *Deleted

## 2012-01-18 ENCOUNTER — Other Ambulatory Visit: Payer: Self-pay | Admitting: Hematology & Oncology

## 2012-01-18 NOTE — Progress Notes (Signed)
Clinical Social Worker received referral from RN for pt's family requesting information on home care, home health, SNF, ect..  Pt's family is scheduled to meet with Dr. Myna Hidalgo on 4/3 to discuss pt's options and concerns with pt living at home alone.  CSW contacted Storm Frisk (pt's daughter in-law) as requested but was unable to reach her; therefore left a message offering support and encouraging the family to call with questions or concerns.  CSW is available for support, please contact as needed.    Tamala Julian, MSW, LCSW Clinical Social Worker [facility] [phone]

## 2012-01-18 NOTE — Telephone Encounter (Addendum)
Message copied by Mirian Capuchin on Tue Jan 18, 2012  2:55 PM ------      Message from: Arlan Organ R      Created: Mon Jan 17, 2012  6:49 PM       Amy-we need to get [name] back as her calcium is on the high side. We need to treat this before it does cause any problems for her. He may need to let her daughter know so we can get this set up. This message given to pt's daughter, [name].  She is going to find a ride for [name] and call us back with a date and time.

## 2012-01-19 ENCOUNTER — Ambulatory Visit: Payer: Medicare Other

## 2012-01-19 ENCOUNTER — Encounter: Payer: Self-pay | Admitting: *Deleted

## 2012-01-19 ENCOUNTER — Ambulatory Visit: Payer: Medicare Other | Admitting: Hematology & Oncology

## 2012-01-19 ENCOUNTER — Encounter: Payer: Medicare Other | Admitting: Physical Therapy

## 2012-01-19 ENCOUNTER — Other Ambulatory Visit: Payer: Self-pay | Admitting: Hematology & Oncology

## 2012-01-19 VITALS — BP 125/67 | HR 76 | Temp 96.7°F

## 2012-01-19 DIAGNOSIS — C9000 Multiple myeloma not having achieved remission: Secondary | ICD-10-CM

## 2012-01-19 MED ORDER — SODIUM CHLORIDE 0.9 % IV SOLN
INTRAVENOUS | Status: DC
  Administered 2012-01-19: 10:00:00 via INTRAVENOUS

## 2012-01-19 NOTE — Progress Notes (Signed)
Clinical Social Worker spoke with pt's daughter Victorino Dike and daughter in-law Storm Frisk to offer support and asses for needs/concerns.  Pt's family has concerns for the pt living at home alone, and requested information/clarification on what resources were available.  CSW reviewed Home Health and Home Care services, and their differences associated with level of care and financial obligations.  CSW and pt's family also discussed ALF options.  Pt's family reported that the pt would prefer to stay in her home with home care/private duty services.  Her 2nd option would be to live with a family member, and 3rd option would be ALF.  Pt currently has 3 different insurance coverage's, but the family is unsure if there is any long term care coverage.  CSW encouraged pt's daughter to contact the insurance companies to determine pt's benefits and/or coverage policies.  Pt and family are scheduled to meet with Dr. Myna Hidalgo this afternoon to discuss pt's current medical status, long term prognosis, and long term care options.  Pt's family expressed interest in home health care coming into the home as soon as possible.  CSW encouraged the family to discuss HHC with Dr. Myna Hidalgo today.  CSW also encouraged the family to call after their meeting with any questions, concerns, or to scheduled a time to meet and further discuss long term care options.  Pt's family was appreciative if contact and stated they would call with any questions or concerns.  CSW is available for support and to assist as needed.             Tamala Julian, MSW, LCSW Clinical Social Worker [facility] [phone]

## 2012-01-19 NOTE — Progress Notes (Signed)
I had a family meeting and the pt was not present.  This is NOT something that I am going to bill for!!!!

## 2012-01-19 NOTE — Patient Instructions (Signed)
Hypercalcemia Hypercalcemia means the calcium in your blood is too high. A level above 10.5 milligrams per deciliter of blood is considered high. Calcium in our blood is important for the control of many things, such as:  Blood clotting.   Conducting of nerve impulses.   Muscle contraction.   Maintaining teeth and bone health.   Other body functions.  In the bloodstream, calcium maintains a constant balance with another mineral, phosphate. Calcium is absorbed into the body through the small intestine. This is helped by Vitamin D. Calcium levels are maintained mostly by vitamin D and a hormone (parathyroid hormone). But the kidneys also help. Hypercalcemia can happen when the concentration of calcium is too high for the kidneys to maintain balance. The body maintains a balance between the calcium we eat and the calcium already in our body. If calcium intake is increased or we cannot use calcium properly, there may be problems. Some common sources of calcium are:   Dairy products.   Nuts.   Eggs.   Whole grains.   Legumes.   Green leafy vegetables.  CAUSES There are many causes of this condition, but some common ones are:  Hyperparathyroidism. This is an over activity of the parathyroid gland.   Cancers of the breast, kidney, lung, head and neck are common causes of calcium increases.   Medications that cause you to urinate more often (diuretics), nausea, vomiting and diarrhea also increase the calcium in the blood.   Overuse of calcium-containing antacids.  SYMPTOMS  Many patients with mild hypercalcemia have no symptoms. For those with symptoms common problems include:  Loss of appetite.   Constipation.   Increased thirst.   Heart rhythm changes.   Abnormal thinking.   Nausea.   Abdominal pain.   Kidney stones.   Mood swings.   Coma and death when severe.   Vomiting.   Increased urination.   High blood pressure.   Confusion.  DIAGNOSIS   Your  caregiver will do a medical history and perform a physical exam on you.   Calcium and parathyroid hormone (PTH) may be measured with a blood test.  TREATMENT   The treatment depends on the calcium level and what is causing the higher level. Hypercalcemia can be lifethreatening. Fast lowering of the calcium level may be necessary.   With normal kidney function, fluids can be given by vein to clear the excess calcium. Hemodialysis works well to reduce dangerous calcium levels if there is poor kidney function. This is a procedure in which a machine is used to filter out unwanted substances. The blood is then returned to the body.   Drugs, such as diuretics, can be given after adequate fluid intake is established. These medications help the kidneys get rid of extra calcium. Drugs that lessen (inhibit) bone loss are helpful in gaining long-term control. Phosphate pills help lower high calcium levels caused by a low supply of phosphate. Anti-inflammatory agents such as steroids are helpful with some cancers and toxic levels of vitamin D.   Treatment of the underlying cause of the hypercalcemia will also correct the imbalance. Hyperparathyroidism is usually treated by surgical removal of one or more of the parathyroid glands and any tissue, other than the glands themselves, that is producing too much hormone.   The hypercalcemia caused by cancer is difficult to treat without controlling the cancer. Symptoms can be improved with fluids and drug therapy as outlined above.  PROGNOSIS   Surgery to remove the parathyroid glands is usually successful.   This also depends on the amount of damage to the kidneys and whether or not it can be treated.   Mild hypercalcemia can be controlled with good fluid intake and the use of effective medications.   Hypercalcemia often develops as a late complication of cancer. The expected outlook is poor without effective anticancer therapy.  PREVENTION   If you are at risk  for developing hypercalcemia, be familiar with early symptoms. Report these to your caregiver.   Good fluid intake (up to four quarts of liquid a day if possible) is helpful.   Try to control nausea and vomiting, and treat fevers to avoid dehydration.   Lowering the amount of calcium in your diet is not necessary. High blood calcium reduces absorption of calcium in the intestine.   Stay as active as possible.  SEEK IMMEDIATE MEDICAL CARE IF:   You develop chest pain, sweating, or shortness of breath.   You get confused, feel faint or pass out.   You develop severe nausea and vomiting.  MAKE SURE YOU:   Understand these instructions.   Will watch your condition.   Will get help right away if you are not doing well or get worse.  Document Released: 12/18/2004 Document Revised: 09/23/2011 Document Reviewed: 09/29/2010 ExitCare Patient Information 2012 [employer]. 

## 2012-01-19 NOTE — Progress Notes (Signed)
Addended by: Lacie Draft on: 01/19/2012 05:05 PM   Modules accepted: Orders

## 2012-01-24 ENCOUNTER — Ambulatory Visit: Payer: Medicare Other | Attending: Hematology & Oncology | Admitting: Physical Therapy

## 2012-01-24 DIAGNOSIS — M6281 Muscle weakness (generalized): Secondary | ICD-10-CM | POA: Insufficient documentation

## 2012-01-24 DIAGNOSIS — IMO0001 Reserved for inherently not codable concepts without codable children: Secondary | ICD-10-CM | POA: Insufficient documentation

## 2012-01-24 DIAGNOSIS — R5381 Other malaise: Secondary | ICD-10-CM | POA: Insufficient documentation

## 2012-01-25 ENCOUNTER — Ambulatory Visit: Payer: Medicare Other | Admitting: Hematology & Oncology

## 2012-01-25 ENCOUNTER — Ambulatory Visit: Payer: Medicare Other

## 2012-01-25 ENCOUNTER — Other Ambulatory Visit: Payer: Medicare Other | Admitting: Lab

## 2012-01-26 ENCOUNTER — Ambulatory Visit: Payer: Medicare Other | Admitting: Physical Therapy

## 2012-01-26 ENCOUNTER — Other Ambulatory Visit: Payer: Self-pay | Admitting: *Deleted

## 2012-01-26 ENCOUNTER — Ambulatory Visit: Payer: Medicare Other

## 2012-01-26 DIAGNOSIS — C9000 Multiple myeloma not having achieved remission: Secondary | ICD-10-CM

## 2012-01-26 MED ORDER — SULFAMETHOXAZOLE-TRIMETHOPRIM 800-160 MG PO TABS: ORAL_TABLET | ORAL | Status: DC

## 2012-01-26 MED ORDER — DEXAMETHASONE 4 MG PO TABS: ORAL_TABLET | ORAL | Status: DC

## 2012-01-26 MED ORDER — TRAZODONE HCL 50 MG PO TABS: 50.0000 mg | ORAL_TABLET | Freq: Every day | ORAL | Status: DC

## 2012-01-28 ENCOUNTER — Other Ambulatory Visit: Payer: Self-pay | Admitting: Hematology & Oncology

## 2012-01-28 ENCOUNTER — Other Ambulatory Visit: Payer: Self-pay | Admitting: *Deleted

## 2012-01-28 DIAGNOSIS — C9000 Multiple myeloma not having achieved remission: Secondary | ICD-10-CM

## 2012-01-28 MED ORDER — TEMAZEPAM 30 MG PO CAPS: 30.0000 mg | ORAL_CAPSULE | Freq: Every evening | ORAL | Status: DC | PRN

## 2012-01-28 NOTE — Telephone Encounter (Signed)
Pt called stating that she can't take Trazodone because "I have horrible nightmares". She stated that she would prefer to go back on the medication "I was just on". Explained to her that her medication was changed because her dgtr Victorino Dike called to report that she was taking 2 pills and still couldn't sleep. Marleny said that "during that period of time, I don't think anything could have made me sleep". She said she and Dr Myna Hidalgo discussed her taking 1 or 2 of the pills depending on how it worked. Reviewed with Dr Myna Hidalgo. Ok to resume Restoril 30 mg qhs. When this medication was called in, asked that the pharmacist explain to her that only 1 tab can be taken as it is the max dose. Trazodone was added to her allergy list.

## 2012-01-31 ENCOUNTER — Ambulatory Visit: Payer: Medicare Other | Admitting: Physical Therapy

## 2012-02-01 ENCOUNTER — Ambulatory Visit: Payer: Medicare Other

## 2012-02-01 ENCOUNTER — Other Ambulatory Visit: Payer: Medicare Other | Admitting: Lab

## 2012-02-02 ENCOUNTER — Ambulatory Visit: Payer: Medicare Other | Admitting: Physical Therapy

## 2012-02-02 ENCOUNTER — Ambulatory Visit: Payer: Medicare Other

## 2012-02-03 ENCOUNTER — Other Ambulatory Visit: Payer: Self-pay | Admitting: Hematology & Oncology

## 2012-02-04 ENCOUNTER — Other Ambulatory Visit: Payer: Self-pay | Admitting: *Deleted

## 2012-02-04 DIAGNOSIS — C9000 Multiple myeloma not having achieved remission: Secondary | ICD-10-CM

## 2012-02-04 MED ORDER — HYDROCODONE-ACETAMINOPHEN 5-325 MG PO TABS: ORAL_TABLET | ORAL | Status: DC

## 2012-02-04 NOTE — Telephone Encounter (Addendum)
Pt called requesting a refill for pain mediation for leg cramps & back pain (chronic). It was last given on 12/30/11. She also hasn't had a BM for 5 days. Denies any n/v but feels very uncomfortable. To try 1/2 bottle of Magnesium Citrate and increase if needed. Also asked her to pick up a mineral oil enema to use (instructions for use given) if the Mag Citrate doesn't work. Once she has a good BM she was advised to resume Miralax BID with 8 oz of water or juice. If she does not have a good BM, she knows to call the office. She verbalized understanding of all of the instructions.

## 2012-02-07 ENCOUNTER — Emergency Department (INDEPENDENT_AMBULATORY_CARE_PROVIDER_SITE_OTHER): Payer: Medicare Other

## 2012-02-07 ENCOUNTER — Emergency Department (HOSPITAL_BASED_OUTPATIENT_CLINIC_OR_DEPARTMENT_OTHER)
Admission: EM | Admit: 2012-02-07 | Discharge: 2012-02-07 | Disposition: A | Discharge status: Home / Self Care | Payer: Medicare Other | Attending: Emergency Medicine | Admitting: Emergency Medicine

## 2012-02-07 ENCOUNTER — Encounter: Payer: Medicare Other | Admitting: Physical Therapy

## 2012-02-07 ENCOUNTER — Encounter (HOSPITAL_BASED_OUTPATIENT_CLINIC_OR_DEPARTMENT_OTHER): Payer: Self-pay

## 2012-02-07 DIAGNOSIS — G8929 Other chronic pain: Secondary | ICD-10-CM | POA: Insufficient documentation

## 2012-02-07 DIAGNOSIS — E039 Hypothyroidism, unspecified: Secondary | ICD-10-CM | POA: Insufficient documentation

## 2012-02-07 DIAGNOSIS — R112 Nausea with vomiting, unspecified: Secondary | ICD-10-CM

## 2012-02-07 DIAGNOSIS — K5900 Constipation, unspecified: Secondary | ICD-10-CM

## 2012-02-07 DIAGNOSIS — J4489 Other specified chronic obstructive pulmonary disease: Secondary | ICD-10-CM | POA: Insufficient documentation

## 2012-02-07 DIAGNOSIS — R109 Unspecified abdominal pain: Secondary | ICD-10-CM | POA: Insufficient documentation

## 2012-02-07 DIAGNOSIS — R141 Gas pain: Secondary | ICD-10-CM

## 2012-02-07 DIAGNOSIS — Z79899 Other long term (current) drug therapy: Secondary | ICD-10-CM | POA: Insufficient documentation

## 2012-02-07 DIAGNOSIS — F411 Generalized anxiety disorder: Secondary | ICD-10-CM | POA: Insufficient documentation

## 2012-02-07 DIAGNOSIS — K219 Gastro-esophageal reflux disease without esophagitis: Secondary | ICD-10-CM | POA: Insufficient documentation

## 2012-02-07 DIAGNOSIS — R933 Abnormal findings on diagnostic imaging of other parts of digestive tract: Secondary | ICD-10-CM

## 2012-02-07 DIAGNOSIS — J449 Chronic obstructive pulmonary disease, unspecified: Secondary | ICD-10-CM | POA: Insufficient documentation

## 2012-02-07 LAB — DIFFERENTIAL
Basophils Absolute: 0 10*3/uL (ref 0.0–0.1)
Eosinophils Absolute: 0.6 10*3/uL (ref 0.0–0.7)
Lymphs Abs: 0.5 10*3/uL — ABNORMAL LOW (ref 0.7–4.0)
Monocytes Absolute: 0.7 10*3/uL (ref 0.1–1.0)
Neutrophils Relative %: 65 % (ref 43–77)

## 2012-02-07 LAB — BASIC METABOLIC PANEL
BUN: 25 mg/dL — ABNORMAL HIGH (ref 6–23)
Calcium: 9 mg/dL (ref 8.4–10.5)
Creatinine, Ser: 1.2 mg/dL — ABNORMAL HIGH (ref 0.50–1.10)
GFR calc Af Amer: 54 mL/min — ABNORMAL LOW (ref >90)
GFR calc non Af Amer: 47 mL/min — ABNORMAL LOW (ref >90)

## 2012-02-07 LAB — URINALYSIS, ROUTINE W REFLEX MICROSCOPIC
Bilirubin Urine: NEGATIVE
Ketones, ur: NEGATIVE mg/dL
Nitrite: NEGATIVE
Protein, ur: NEGATIVE mg/dL
Urobilinogen, UA: 0.2 mg/dL (ref 0.0–1.0)
pH: 5.5 (ref 5.0–8.0)

## 2012-02-07 LAB — CBC
Platelets: 164 10*3/uL (ref 150–400)
RBC: 3.44 MIL/uL — ABNORMAL LOW (ref 3.87–5.11)
WBC: 5.1 10*3/uL (ref 4.0–10.5)

## 2012-02-07 MED ORDER — IOHEXOL 300 MG/ML  SOLN
36.0000 mL | Freq: Once | INTRAMUSCULAR | Status: AC | PRN
  Administered 2012-02-07: 36 mL via INTRAVENOUS

## 2012-02-07 MED ORDER — FENTANYL CITRATE 0.05 MG/ML IJ SOLN
50.0000 ug | Freq: Once | INTRAMUSCULAR | Status: AC
  Administered 2012-02-07: 50 ug via INTRAVENOUS
  Filled 2012-02-07: qty 2

## 2012-02-07 MED ORDER — ONDANSETRON HCL 4 MG/2ML IJ SOLN
4.0000 mg | Freq: Once | INTRAMUSCULAR | Status: AC
  Administered 2012-02-07: 4 mg via INTRAVENOUS
  Filled 2012-02-07: qty 2

## 2012-02-07 MED ORDER — ONDANSETRON 8 MG PO TBDP
8.0000 mg | ORAL_TABLET | Freq: Once | ORAL | Status: AC
  Administered 2012-02-07: 8 mg via ORAL
  Filled 2012-02-07: qty 1

## 2012-02-07 MED ORDER — SODIUM CHLORIDE 0.9 % IV BOLUS (SEPSIS)
500.0000 mL | Freq: Once | INTRAVENOUS | Status: AC
  Administered 2012-02-07: 1000 mL via INTRAVENOUS

## 2012-02-07 MED ORDER — IOHEXOL 300 MG/ML  SOLN
100.0000 mL | Freq: Once | INTRAMUSCULAR | Status: AC | PRN
  Administered 2012-02-07: 100 mL via INTRAVENOUS

## 2012-02-07 MED ORDER — FENTANYL CITRATE 0.05 MG/ML IJ SOLN
50.0000 ug | Freq: Once | INTRAMUSCULAR | Status: AC
  Administered 2012-02-07: 50 ug via INTRAVENOUS

## 2012-02-07 MED ORDER — HEPARIN SOD (PORK) LOCK FLUSH 100 UNIT/ML IV SOLN
INTRAVENOUS | Status: AC
  Administered 2012-02-07: 500 [IU]
  Filled 2012-02-07: qty 5

## 2012-02-07 MED ORDER — POLYETHYLENE GLYCOL 3350 17 GM/SCOOP PO POWD: 17.0000 g | Freq: Three times a day (TID) | ORAL | Status: AC

## 2012-02-07 NOTE — ED Notes (Signed)
Pt is drinking PO contrast 

## 2012-02-07 NOTE — ED Provider Notes (Signed)
Patient at the time of signout had CT of the abdomen and pelvis as well as a urinalysis pending. CT was remarkable only for constipation. Patient did feel better following disimpaction by prior physician. Urinalysis was unremarkable. Results were shared with patient. Patient was told to take MiraLAX 3 times a day on a scheduled basis until regular bowel movements resume. She can followup with her other physicians regarding her symptoms. Patient was also told that she could use an enema as needed as well. Patient declined enema here in the emergency department if she was feeling somewhat better and preferred to do this at home. Patient was discharged in good condition.  1. Abdominal pain   2. Constipation      Cyndra Numbers, MD 02/07/12 [id number]

## 2012-02-07 NOTE — ED Notes (Signed)
MD at bedside. 

## 2012-02-07 NOTE — ED Notes (Signed)
Dr Alto Denver at bedside speaking with family.

## 2012-02-07 NOTE — ED Notes (Signed)
Family at bedside. 

## 2012-02-07 NOTE — ED Notes (Signed)
Patient transported to X-ray 

## 2012-02-07 NOTE — ED Notes (Signed)
Pt states that she has not had a BM for one week.  Pt states that she is getting nauseous and vomiting when eating, Dr Monika Salk has told her to come to ED for further treatment.

## 2012-02-07 NOTE — ED Notes (Signed)
Chart reviewed and care assumed. 

## 2012-02-07 NOTE — ED Notes (Signed)
Pt assisted to BR via w/c for urine specimen. 

## 2012-02-07 NOTE — ED Notes (Signed)
Pt returned from radiology.  She reports nausea and pain.  Pt medicated .  Pharmacy tech at bedside.

## 2012-02-07 NOTE — ED Provider Notes (Signed)
History     CSN: [id number]  Arrival date & time 02/07/12  1329   First MD Initiated Contact with Patient 02/07/12 1400      Chief Complaint  Patient presents with  . Constipation    Patient is a 66 y.o. female presenting with constipation. The history is provided by the patient.  Constipation  The current episode started 5 to 7 days ago. The onset was gradual. The problem occurs continuously. The problem has been gradually worsening. The pain is mild. The stool is described as hard. There was no prior successful therapy. Associated symptoms include abdominal pain, nausea and vomiting. Pertinent negatives include no fever, no diarrhea and no rectal pain.  nothing improves her symptoms Nothing worsens her symptoms  Pt report constipation for one week - she reports no BM and no flatus  Does report some vomiting and abdominal cramping She reports she has h/o multiple myeloma and recently switch chemo drugs.  Since the switch, she has had these symptoms She reports she has tried home enema and oral meds but no success  Past Medical History  Diagnosis Date  . Myeloma   . Anxiety   . Hypothyroidism   . GERD (gastroesophageal reflux disease)   . Myeloma   . OAB (overactive bladder)   . COPD (chronic obstructive pulmonary disease)   . Osteoporosis   . Depression   . Chronic pain   . Renal insufficiency     Past Surgical History  Procedure Date  . Leg surgery   . Cholecystectomy   . Tonsillectomy   . Breast surgery   . Limbal stem cell transplant   . Abdominal hysterectomy     History reviewed. No pertinent family history.  History  Substance Use Topics  . Smoking status: Never Smoker   . Smokeless tobacco: Never Used  . Alcohol Use: No    OB History    Grav Para Term Preterm Abortions TAB SAB Ect Mult Living                  Review of Systems  Constitutional: Negative for fever.  Gastrointestinal: Positive for nausea, vomiting, abdominal pain and  constipation. Negative for diarrhea and rectal pain.  All other systems reviewed and are negative.    Allergies  Trazodone and nefazodone; Hydromorphone; Morphine and related; and Penicillins  Home Medications   Current Outpatient Rx  Name Route Sig Dispense Refill  . ACYCLOVIR 400 MG PO TABS Oral Take 800 mg by mouth 2 (two) times daily.     Marland Kitchen ALPRAZOLAM 0.25 MG PO TABS Oral Take 1 tablet (0.25 mg total) by mouth 3 (three) times daily as needed for sleep. 30 tablet 3  . ASPIRIN EC 81 MG PO TBEC Oral Take 81 mg by mouth at bedtime.    . CELEBREX 100 MG PO CAPS  TAKE 1 CAPSULE TWICE A DAY 60 capsule 3  . CYMBALTA 30 MG PO CPEP  TAKE 1 CAPSULE BY MOUTH EVERY DAY 30 capsule 9  . DEXAMETHASONE 4 MG PO TABS  5 tabs (20 mg) weekly while on Revlimid 20 tablet 5  . DIAZEPAM 5 MG PO TABS Oral Take 1 tablet (5 mg total) by mouth every 6 (six) hours as needed. Muscle spasm 30 tablet 1  . DIPHENOXYLATE-ATROPINE 2.5-0.025 MG PO TABS Oral Take 1 tablet by mouth 4 (four) times daily as needed. Diarrhea 90 tablet 1  . FLUTICASONE-SALMETEROL 250-50 MCG/DOSE IN AEPB Inhalation Inhale 1 puff into the lungs every 12 (  twelve) hours.    Marland Kitchen HYDROCODONE-ACETAMINOPHEN 5-325 MG PO TABS  Take 1 or 2 tabs every 8 hours as needed for pain 60 tablet 1  . HYDROCODONE-HOMATROPINE 5-1.5 MG/5ML PO SYRP  Twice daily.    Marland Kitchen HYDROXYZINE HCL 25 MG PO TABS  25 mg as needed.    Marland Kitchen LEVOTHYROXINE SODIUM 50 MCG PO TABS Oral Take 50 mcg by mouth at bedtime.     Marland Kitchen LORAZEPAM 1 MG PO TABS Oral Take 1 mg by mouth every 8 (eight) hours as needed. Nausea     . NEXIUM 40 MG PO CPDR  TAKE 1 CAPSULE BY MOUTH EVERY DAY 30 capsule 8  . OLOPATADINE HCL 0.1 % OP SOLN Both Eyes Place 1 drop into both eyes 2 (two) times daily.     Marland Kitchen ONDANSETRON HCL 8 MG PO TABS  8 mg Once daily as needed.    Marland Kitchen PIRBUTEROL ACETATE 200 MCG/INH IN AERB Inhalation Inhale 2 puffs into the lungs 4 (four) times daily as needed. Wheezing     . POMALIDOMIDE 4 MG PO CAPS  Oral Take 4 mg by mouth Daily.    . SULFAMETHOXAZOLE-TMP DS 800-160 MG PO TABS Oral Take 1 tablet by mouth 2 (two) times daily. 10 tablet 0  . SULFAMETHOXAZOLE-TRIMETHOPRIM 800-160 MG PO TABS  Twice daily M-W-F 24 tablet 6  . TEMAZEPAM 30 MG PO CAPS Oral Take 1 capsule (30 mg total) by mouth at bedtime as needed for sleep. 30 capsule 2  . TOLTERODINE TARTRATE ER 4 MG PO CP24 Oral Take 4 mg by mouth daily.      . TORSEMIDE 20 MG PO TABS Oral Take 20 mg by mouth daily as needed. Fluid     . VITAMIN D (ERGOCALCIFEROL) 50000 UNITS PO CAPS Oral Take 50,000 Units by mouth every 7 (seven) days. Take on Monday     . ZOLPIDEM TARTRATE 5 MG PO TABS Oral Take 5 mg by mouth at bedtime.      BP 108/62  Pulse 91  Temp(Src) 98.3 F (36.8 C) (Oral)  Resp 17  Ht 5\' 4"  (1.626 m)  Wt 142 lb (64.411 kg)  BMI 24.37 kg/m2  SpO2 99%  Physical Exam CONSTITUTIONAL: Well developed/well nourished HEAD AND FACE: Normocephalic/atraumatic EYES: EOMI/PERRL ENMT: Mucous membranes moist NECK: supple no meningeal signs SPINE:entire spine nontender CV: S1/S2 noted, no murmurs/rubs/gallops noted LUNGS: Lungs are clear to auscultation bilaterally, no apparent distress ABDOMEN: soft, nontender, no rebound or guarding, +BS Rectal - no mass, no blood.  She has hard stool in vault, chaperone present GU:no cva tenderness NEURO: Pt is awake/alert, moves all extremitiesx4 EXTREMITIES: pulses normal, full ROM SKIN: warm, color normal PSYCH: no abnormalities of mood noted  ED Course  Fecal disimpaction Date/Time: 02/07/2012 2:30 PM Performed by: Joya Gaskins Authorized by: Joya Gaskins Consent: Verbal consent obtained. Patient sedated: no Patient tolerance: Patient tolerated the procedure well with no immediate complications. Comments: Fecal disimpaction performed with nurse present Disimpaction successful     3:14 PM Pt now with worsened pain She has diffuse lower abdominal tenderness Will get CT  imaging given recent vomiting She reports she can take fentanyl At signout to dr hunt, followup on CT imaging    Labs Reviewed  CBC  DIFFERENTIAL  BASIC METABOLIC PANEL     MDM  Nursing notes reviewed and considered in documentation Previous records reviewed and considered All labs/vitals reviewed and considered        Date: 02/07/2012  Rate: 92  Rhythm: normal sinus rhythm  QRS Axis: left  Intervals: normal  ST/T Wave abnormalities: normal  Conduction Disutrbances:none  Narrative Interpretation:   Old EKG Reviewed: unchanged    Joya Gaskins, MD 02/07/12 1515

## 2012-02-07 NOTE — ED Notes (Signed)
Pt returned from radiology.

## 2012-02-09 ENCOUNTER — Ambulatory Visit: Payer: Medicare Other | Admitting: Physical Therapy

## 2012-02-09 ENCOUNTER — Telehealth: Payer: Self-pay | Admitting: *Deleted

## 2012-02-09 NOTE — Telephone Encounter (Signed)
Pt called yesterday with new onset right sided "body pain". She said this has never happened before and wondered if it was a result of the Pomalyst. Explained that side effects from medication usually do not happen to one side of the body and not the other therefore the

## 2012-02-14 ENCOUNTER — Ambulatory Visit (HOSPITAL_BASED_OUTPATIENT_CLINIC_OR_DEPARTMENT_OTHER): Payer: Medicare Other

## 2012-02-14 ENCOUNTER — Ambulatory Visit: Payer: Medicare Other

## 2012-02-14 ENCOUNTER — Ambulatory Visit (HOSPITAL_BASED_OUTPATIENT_CLINIC_OR_DEPARTMENT_OTHER): Payer: Medicare Other | Admitting: Hematology & Oncology

## 2012-02-14 ENCOUNTER — Other Ambulatory Visit: Payer: Medicare Other | Admitting: Lab

## 2012-02-14 ENCOUNTER — Other Ambulatory Visit (HOSPITAL_BASED_OUTPATIENT_CLINIC_OR_DEPARTMENT_OTHER): Payer: Medicare Other | Admitting: Lab

## 2012-02-14 VITALS — BP 125/65 | HR 85 | Temp 96.8°F | Ht 59.0 in | Wt 133.0 lb

## 2012-02-14 VITALS — BP 136/79 | HR 96 | Temp 98.6°F

## 2012-02-14 DIAGNOSIS — D631 Anemia in chronic kidney disease: Secondary | ICD-10-CM

## 2012-02-14 DIAGNOSIS — C9000 Multiple myeloma not having achieved remission: Secondary | ICD-10-CM

## 2012-02-14 DIAGNOSIS — D509 Iron deficiency anemia, unspecified: Secondary | ICD-10-CM

## 2012-02-14 DIAGNOSIS — C9002 Multiple myeloma in relapse: Secondary | ICD-10-CM

## 2012-02-14 DIAGNOSIS — D801 Nonfamilial hypogammaglobulinemia: Secondary | ICD-10-CM

## 2012-02-14 DIAGNOSIS — D6481 Anemia due to antineoplastic chemotherapy: Secondary | ICD-10-CM

## 2012-02-14 LAB — CBC WITH DIFFERENTIAL (CANCER CENTER ONLY)
BASO#: 0 10*3/uL (ref 0.0–0.2)
EOS%: 11.3 % — ABNORMAL HIGH (ref 0.0–7.0)
HGB: 10.2 g/dL — ABNORMAL LOW (ref 11.6–15.9)
MCH: 33.3 pg (ref 26.0–34.0)
MCHC: 31.3 g/dL — ABNORMAL LOW (ref 32.0–36.0)
MONO%: 13.5 % — ABNORMAL HIGH (ref 0.0–13.0)
NEUT#: 2.5 10*3/uL (ref 1.5–6.5)
NEUT%: 59.4 % (ref 39.6–80.0)
RDW: 16.5 % — ABNORMAL HIGH (ref 11.1–15.7)

## 2012-02-14 MED ORDER — HEPARIN SOD (PORK) LOCK FLUSH 100 UNIT/ML IV SOLN
500.0000 [IU] | Freq: Once | INTRAVENOUS | Status: AC
  Administered 2012-02-14: 500 [IU] via INTRAVENOUS
  Filled 2012-02-14: qty 5

## 2012-02-14 MED ORDER — SODIUM CHLORIDE 0.9 % IJ SOLN
10.0000 mL | INTRAMUSCULAR | Status: DC | PRN
  Administered 2012-02-14: 10 mL via INTRAVENOUS
  Filled 2012-02-14: qty 10

## 2012-02-14 MED ORDER — DARBEPOETIN ALFA-POLYSORBATE 300 MCG/0.6ML IJ SOLN: 300.0000 ug | Freq: Once | INTRAMUSCULAR | Status: DC

## 2012-02-14 MED ORDER — METHYLPREDNISOLONE SODIUM SUCC 125 MG IJ SOLR
60.0000 mg | Freq: Once | INTRAMUSCULAR | Status: AC
  Administered 2012-02-14: 60 mg via INTRAVENOUS

## 2012-02-14 MED ORDER — ZOLEDRONIC ACID 4 MG/5ML IV CONC
3.3000 mg | Freq: Once | INTRAVENOUS | Status: AC
  Administered 2012-02-14: 3.3 mg via INTRAVENOUS
  Filled 2012-02-14: qty 4.13

## 2012-02-14 MED ORDER — ONDANSETRON 8 MG/50ML IVPB (CHCC)
8.0000 mg | Freq: Once | INTRAVENOUS | Status: AC
  Administered 2012-02-14: 8 mg via INTRAVENOUS

## 2012-02-14 MED ORDER — IMMUNE GLOBULIN (HUMAN) 20 GM/200ML IV SOLN
40.0000 g | INTRAVENOUS | Status: DC
  Administered 2012-02-14: 40 g via INTRAVENOUS
  Filled 2012-02-14: qty 400

## 2012-02-14 NOTE — Patient Instructions (Signed)

## 2012-02-14 NOTE — Progress Notes (Signed)
This office note has been dictated.

## 2012-02-14 NOTE — Progress Notes (Signed)
DIAGNOSES: 1. 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  CURRENT THERAPY: 1. Pomalidomide 4 mg p.o. daily/Decadron 20 mg p.o. q.week. 2. Zometa 4 mg IV q.month. 3. IVIG 40 g q.4 weeks.  INTERIM HISTORY:  Ms. Maret comes in for followup.  We have been having some issues with her medications.  We are both trying to figure out what medications she has been taking.  We have been trying to narrow down her medications.  She is on the pomalidomide.  She started this I think a few days ago. It is hard to say if she had any issues with this.  Typically, mental status changes is not one of the common side effects.  She has restarted the pomalidomide.  She has I think 9 days to go.  I told her with her next cycle of pomalidomide we will cut her dose down to 3 mg a day.  She is doing physical therapy.  I think this is a great idea for her. This gets her I think out of the house and does get her activity which she really enjoys.  She does not drive.  I will not let her drive.  I just do not think she is ready to sit behind the wheel of a car.  She is on aspirin.  She also is on acyclovir.  She was recently started on Bactrim.  I am still not sure as to what is going on with the Bactrim.  I want over all her medicines at length today.  We have tried to figure out what she was taking and what she was not taking.  There are a lot of medicines that she is off of.  She just takes some of these as needed.  It is very difficult to monitor her myeloma with lab work.  Her myeloma really does not produce a lot of kappa light chain in the blood or urine.  Her last kappa light chain that we checked on her was 0.66 mg/dL.  She I believe has never had a monoclonal spike in the serum. Her heavy chains are all depressed.  PHYSICAL EXAMINATION:  General:  This is a well-developed, well- nourished white female in no obvious distress.   She comes in with a walker because of past history of a reaction to radiation.  Of note, we last gave her iron last year.  We may actually have to consider giving her iron as her iron saturation was only 15% a month or so ago.  This is a somewhat petite white female in no obvious distress.  Vital signs: 96.8, pulse 85, respiratory rate 20, blood pressure 125/65.  Weight is 133.  Head and neck:  Exam shows a normocephalic, atraumatic skull. There are no ocular or oral lesions.  There are no palpable cervical or supraclavicular lymph nodes.  Lungs:  Clear bilaterally.  Cardiac: Regular rate and rhythm with normal S1 and S2.  There are no murmurs, rubs or bruits.  Abdomen exam soft with good bowel sounds.  There is no palpable abdominal mass.  There is no fluid wave.  There is no palpable hepatosplenomegaly.  Extremities:  Shows no edema in her legs.  She does have a little bit maybe stasis dermatitis type changes in her lower legs.  She has some slight peeling on her feet.  Neurological:  No focal neurological deficits.  LABORATORY STUDIES:  White cell count is 4.2, hemoglobin 10.2, hematocrit 32.6, platelet count is 212.  IMPRESSION:  Ms. Bradway is a 66 year old female with recurrent kappa light chain myeloma.  She actually has had myeloma I think for over 20 years.  She has been through transplant.  We now have her on pomalidomide as a "salvage therapy".  Again, will cut her pomalidomide dose down to 3 mg p.o. daily with her next cycle of treatment.  I do wonder about giving her some iron.  I think this may help her out.  She does get Aranesp.  She will go ahead and get Aranesp today.  I want to see her back in 1 month.    ______________________________ Josph Macho, M.D. PRE/MEDQ  D:  02/14/2012  T:  02/14/2012  Job:  [id number]

## 2012-02-15 ENCOUNTER — Ambulatory Visit: Payer: Medicare Other

## 2012-02-15 ENCOUNTER — Other Ambulatory Visit: Payer: Medicare Other | Admitting: Lab

## 2012-02-15 ENCOUNTER — Ambulatory Visit: Payer: Medicare Other | Admitting: Hematology & Oncology

## 2012-02-15 LAB — KAPPA/LAMBDA LIGHT CHAINS: Kappa:Lambda Ratio: 2.19 — ABNORMAL HIGH (ref 0.26–1.65)

## 2012-02-15 LAB — COMPREHENSIVE METABOLIC PANEL
AST: 16 U/L (ref 0–37)
Alkaline Phosphatase: 57 U/L (ref 39–117)
BUN: 23 mg/dL (ref 6–23)
Creatinine, Ser: 1.56 mg/dL — ABNORMAL HIGH (ref 0.50–1.10)
Potassium: 4.8 mEq/L (ref 3.5–5.3)

## 2012-02-15 LAB — RETICULOCYTES (CHCC): Retic Ct Pct: 1.7 % (ref 0.4–2.3)

## 2012-02-15 LAB — FERRITIN: Ferritin: 2378 ng/mL — ABNORMAL HIGH (ref 10–291)

## 2012-02-15 LAB — IRON AND TIBC
TIBC: 189 ug/dL — ABNORMAL LOW (ref 250–470)
UIBC: 83 ug/dL — ABNORMAL LOW (ref 125–400)

## 2012-02-16 ENCOUNTER — Ambulatory Visit: Payer: Medicare Other

## 2012-02-17 ENCOUNTER — Ambulatory Visit: Payer: Medicare Other | Attending: Hematology & Oncology

## 2012-02-17 DIAGNOSIS — M6281 Muscle weakness (generalized): Secondary | ICD-10-CM | POA: Insufficient documentation

## 2012-02-17 DIAGNOSIS — R262 Difficulty in walking, not elsewhere classified: Secondary | ICD-10-CM | POA: Insufficient documentation

## 2012-02-17 DIAGNOSIS — M2560 Stiffness of unspecified joint, not elsewhere classified: Secondary | ICD-10-CM | POA: Insufficient documentation

## 2012-02-17 DIAGNOSIS — IMO0001 Reserved for inherently not codable concepts without codable children: Secondary | ICD-10-CM | POA: Insufficient documentation

## 2012-02-22 ENCOUNTER — Ambulatory Visit: Payer: Medicare Other

## 2012-02-22 ENCOUNTER — Other Ambulatory Visit: Payer: Medicare Other | Admitting: Lab

## 2012-02-23 ENCOUNTER — Ambulatory Visit: Payer: Medicare Other

## 2012-02-24 ENCOUNTER — Encounter: Payer: Self-pay | Admitting: *Deleted

## 2012-02-24 ENCOUNTER — Ambulatory Visit: Payer: Medicare Other

## 2012-02-24 NOTE — Progress Notes (Signed)
Received fax from [facility] that they received the referral and will be in touch with [place] after they verify insurance coverage and made delivery arrangements.

## 2012-03-10 ENCOUNTER — Other Ambulatory Visit: Payer: Self-pay | Admitting: *Deleted

## 2012-03-10 DIAGNOSIS — R05 Cough: Secondary | ICD-10-CM

## 2012-03-10 MED ORDER — HYDROCODONE-HOMATROPINE 5-1.5 MG/5ML PO SYRP: 5.0000 mL | ORAL_SOLUTION | Freq: Four times a day (QID) | ORAL | Status: DC | PRN

## 2012-03-15 ENCOUNTER — Encounter (HOSPITAL_COMMUNITY)
Admission: RE | Admit: 2012-03-15 | Discharge: 2012-03-15 | Disposition: A | Discharge status: Home / Self Care | Payer: Medicare Other | Source: Ambulatory Visit | Attending: Hematology & Oncology | Admitting: Hematology & Oncology

## 2012-03-15 ENCOUNTER — Ambulatory Visit: Payer: Medicare Other | Admitting: Hematology & Oncology

## 2012-03-15 ENCOUNTER — Other Ambulatory Visit: Payer: Medicare Other | Admitting: Lab

## 2012-03-15 ENCOUNTER — Other Ambulatory Visit: Payer: Self-pay | Admitting: *Deleted

## 2012-03-15 ENCOUNTER — Ambulatory Visit (HOSPITAL_BASED_OUTPATIENT_CLINIC_OR_DEPARTMENT_OTHER): Payer: Medicare Other

## 2012-03-15 ENCOUNTER — Ambulatory Visit: Payer: Medicare Other

## 2012-03-15 ENCOUNTER — Other Ambulatory Visit (HOSPITAL_BASED_OUTPATIENT_CLINIC_OR_DEPARTMENT_OTHER): Payer: Medicare Other | Admitting: Lab

## 2012-03-15 VITALS — BP 112/62 | HR 80 | Temp 97.8°F

## 2012-03-15 DIAGNOSIS — C9000 Multiple myeloma not having achieved remission: Secondary | ICD-10-CM

## 2012-03-15 DIAGNOSIS — D631 Anemia in chronic kidney disease: Secondary | ICD-10-CM

## 2012-03-15 DIAGNOSIS — D509 Iron deficiency anemia, unspecified: Secondary | ICD-10-CM

## 2012-03-15 DIAGNOSIS — N189 Chronic kidney disease, unspecified: Secondary | ICD-10-CM

## 2012-03-15 DIAGNOSIS — D649 Anemia, unspecified: Secondary | ICD-10-CM

## 2012-03-15 DIAGNOSIS — T451X5A Adverse effect of antineoplastic and immunosuppressive drugs, initial encounter: Secondary | ICD-10-CM | POA: Insufficient documentation

## 2012-03-15 DIAGNOSIS — D801 Nonfamilial hypogammaglobulinemia: Secondary | ICD-10-CM

## 2012-03-15 LAB — CBC WITH DIFFERENTIAL (CANCER CENTER ONLY)
BASO%: 0 % (ref 0.0–2.0)
EOS%: 4.6 % (ref 0.0–7.0)
Eosinophils Absolute: 0.2 10*3/uL (ref 0.0–0.5)
LYMPH%: 16.2 % (ref 14.0–48.0)
MCH: 35.1 pg — ABNORMAL HIGH (ref 26.0–34.0)
MCHC: 32.7 g/dL (ref 32.0–36.0)
MCV: 108 fL — ABNORMAL HIGH (ref 81–101)
MONO%: 16.2 % — ABNORMAL HIGH (ref 0.0–13.0)
Platelets: 202 10*3/uL (ref 145–400)
RDW: 18.6 % — ABNORMAL HIGH (ref 11.1–15.7)

## 2012-03-15 LAB — TECHNOLOGIST REVIEW CHCC SATELLITE

## 2012-03-15 MED ORDER — ACETAMINOPHEN 325 MG PO TABS
650.0000 mg | ORAL_TABLET | Freq: Once | ORAL | Status: AC
  Administered 2012-03-15: 650 mg via ORAL

## 2012-03-15 MED ORDER — ZOLEDRONIC ACID 4 MG/5ML IV CONC
3.3000 mg | Freq: Once | INTRAVENOUS | Status: AC
  Administered 2012-03-15: 3.3 mg via INTRAVENOUS
  Filled 2012-03-15: qty 4.13

## 2012-03-15 MED ORDER — DIPHENHYDRAMINE HCL 25 MG PO CAPS: 25.0000 mg | ORAL_CAPSULE | Freq: Once | ORAL | Status: DC

## 2012-03-15 MED ORDER — IMMUNE GLOBULIN (HUMAN) 20 GM/200ML IV SOLN
40.0000 g | INTRAVENOUS | Status: DC
  Administered 2012-03-15: 40 g via INTRAVENOUS
  Filled 2012-03-15: qty 400

## 2012-03-15 NOTE — Telephone Encounter (Signed)
error 

## 2012-03-16 ENCOUNTER — Ambulatory Visit (HOSPITAL_BASED_OUTPATIENT_CLINIC_OR_DEPARTMENT_OTHER): Payer: Medicare Other

## 2012-03-16 VITALS — BP 102/55 | HR 76 | Temp 98.5°F | Resp 18

## 2012-03-16 DIAGNOSIS — D649 Anemia, unspecified: Secondary | ICD-10-CM

## 2012-03-16 DIAGNOSIS — C9000 Multiple myeloma not having achieved remission: Secondary | ICD-10-CM

## 2012-03-16 DIAGNOSIS — N189 Chronic kidney disease, unspecified: Secondary | ICD-10-CM

## 2012-03-16 DIAGNOSIS — D6481 Anemia due to antineoplastic chemotherapy: Secondary | ICD-10-CM

## 2012-03-16 DIAGNOSIS — N289 Disorder of kidney and ureter, unspecified: Secondary | ICD-10-CM

## 2012-03-16 DIAGNOSIS — D801 Nonfamilial hypogammaglobulinemia: Secondary | ICD-10-CM

## 2012-03-16 LAB — COMPREHENSIVE METABOLIC PANEL
Alkaline Phosphatase: 39 U/L (ref 39–117)
Glucose, Bld: 135 mg/dL — ABNORMAL HIGH (ref 70–99)
Sodium: 139 mEq/L (ref 135–145)
Total Bilirubin: 0.3 mg/dL (ref 0.3–1.2)
Total Protein: 5.5 g/dL — ABNORMAL LOW (ref 6.0–8.3)

## 2012-03-16 LAB — PREPARE RBC (CROSSMATCH)

## 2012-03-16 LAB — IRON AND TIBC
Iron: 147 ug/dL — ABNORMAL HIGH (ref 42–145)
UIBC: 100 ug/dL — ABNORMAL LOW (ref 125–400)

## 2012-03-16 LAB — KAPPA/LAMBDA LIGHT CHAINS: Lambda Free Lght Chn: 0.05 mg/dL — ABNORMAL LOW (ref 0.57–2.63)

## 2012-03-16 MED ORDER — SODIUM CHLORIDE 0.9 % IJ SOLN
10.0000 mL | INTRAMUSCULAR | Status: AC | PRN
  Administered 2012-03-16: 10 mL
  Filled 2012-03-16: qty 10

## 2012-03-16 MED ORDER — DARBEPOETIN ALFA-POLYSORBATE 300 MCG/0.6ML IJ SOLN
300.0000 ug | Freq: Once | INTRAMUSCULAR | Status: AC
  Administered 2012-03-16: 300 ug via SUBCUTANEOUS

## 2012-03-16 MED ORDER — ACETAMINOPHEN 325 MG PO TABS
650.0000 mg | ORAL_TABLET | Freq: Once | ORAL | Status: AC
  Administered 2012-03-16: 650 mg via ORAL

## 2012-03-16 MED ORDER — FUROSEMIDE 10 MG/ML IJ SOLN: 20.0000 mg | Freq: Once | INTRAMUSCULAR | Status: DC

## 2012-03-16 MED ORDER — DIPHENHYDRAMINE HCL 25 MG PO CAPS
25.0000 mg | ORAL_CAPSULE | Freq: Once | ORAL | Status: AC
  Administered 2012-03-16: 25 mg via ORAL

## 2012-03-16 MED ORDER — HEPARIN SOD (PORK) LOCK FLUSH 100 UNIT/ML IV SOLN
250.0000 [IU] | INTRAVENOUS | Status: DC | PRN
  Filled 2012-03-16: qty 5

## 2012-03-16 MED ORDER — SODIUM CHLORIDE 0.9 % IV SOLN
250.0000 mL | Freq: Once | INTRAVENOUS | Status: AC
  Administered 2012-03-16: 250 mL via INTRAVENOUS

## 2012-03-16 MED ORDER — HEPARIN SOD (PORK) LOCK FLUSH 100 UNIT/ML IV SOLN
500.0000 [IU] | Freq: Once | INTRAVENOUS | Status: AC
  Administered 2012-03-16: 500 [IU] via INTRAVENOUS
  Filled 2012-03-16: qty 5

## 2012-03-16 NOTE — Patient Instructions (Signed)
Blood Transfusion   A blood transfusion replaces your blood or some of its parts. Blood is replaced when you have lost blood because of surgery, an accident, or for severe blood conditions like anemia.  You can donate blood to be used on yourself if you have a planned surgery. If you lose blood during that surgery, your own blood can be given back to you.  Any blood given to you is checked to make sure it matches your blood type. Your temperature, blood pressure, and heart rate (vital signs) will be checked often.   GET HELP RIGHT AWAY IF:    You feel sick to your stomach (nauseous) or throw up (vomit).   You have watery poop (diarrhea).   You have shortness of breath or trouble breathing.   You have blood in your pee (urine) or have dark colored pee.   You have chest pain or tightness.   Your eyes or skin turn yellow (jaundice).   You have a temperature by mouth above 102 F (38.9 C), not controlled by medicine.   You start to shake and have chills.   You develop a a red rash (hives) or feel itchy.   You develop lightheadedness or feel confused.   You develop back, joint, or muscle pain.   You do not feel hungry (lost appetite).   You feel tired, restless, or nervous.   You develop belly (abdominal) cramps.  Document Released: 12/31/2008 Document Revised: 09/23/2011 Document Reviewed: 12/31/2008  [employer] Patient Information 2012 [employer].

## 2012-03-17 ENCOUNTER — Encounter: Payer: Self-pay | Admitting: Hematology & Oncology

## 2012-03-17 LAB — TYPE AND SCREEN
ABO/RH(D): A NEG
Antibody Screen: NEGATIVE
Unit division: 0

## 2012-03-21 ENCOUNTER — Other Ambulatory Visit: Payer: Self-pay | Admitting: *Deleted

## 2012-03-21 ENCOUNTER — Other Ambulatory Visit: Payer: Self-pay | Admitting: Hematology & Oncology

## 2012-03-21 DIAGNOSIS — D649 Anemia, unspecified: Secondary | ICD-10-CM

## 2012-03-21 DIAGNOSIS — C9000 Multiple myeloma not having achieved remission: Secondary | ICD-10-CM

## 2012-03-21 MED ORDER — MAGIC MOUTHWASH: 5.0000 mL | Freq: Four times a day (QID) | ORAL | Status: DC | PRN

## 2012-03-21 NOTE — Telephone Encounter (Signed)
Pt left message on voicemail stating she was having mouth sores that was causing her to have trouble eating. Returned her call. Asked if she noticed a white coating on her tongue and she said that she did. Explained that this was a common side effect of the oral treatment she was receiving. Reviewed with Dr. Myna Hidalgo. To start Magic Mouthwash with Nystatin. Called to [facility] and spoke to the pharmacist directly. Pt was given directions to swish and spit 4 times per day with understanding. To call back if worsens.

## 2012-03-22 ENCOUNTER — Other Ambulatory Visit (HOSPITAL_BASED_OUTPATIENT_CLINIC_OR_DEPARTMENT_OTHER): Payer: Medicare Other | Admitting: Lab

## 2012-03-22 ENCOUNTER — Other Ambulatory Visit: Payer: Self-pay | Admitting: *Deleted

## 2012-03-22 DIAGNOSIS — C9000 Multiple myeloma not having achieved remission: Secondary | ICD-10-CM

## 2012-03-22 DIAGNOSIS — J069 Acute upper respiratory infection, unspecified: Secondary | ICD-10-CM

## 2012-03-22 DIAGNOSIS — D649 Anemia, unspecified: Secondary | ICD-10-CM

## 2012-03-22 LAB — CBC WITH DIFFERENTIAL (CANCER CENTER ONLY)
BASO%: 0.3 % (ref 0.0–2.0)
EOS%: 18 % — ABNORMAL HIGH (ref 0.0–7.0)
LYMPH#: 0.5 10*3/uL — ABNORMAL LOW (ref 0.9–3.3)
MCHC: 32.4 g/dL (ref 32.0–36.0)
NEUT#: 1.8 10*3/uL (ref 1.5–6.5)
NEUT%: 48.3 % (ref 39.6–80.0)
Platelets: 97 10*3/uL — ABNORMAL LOW (ref 145–400)
RDW: 20.1 % — ABNORMAL HIGH (ref 11.1–15.7)

## 2012-03-22 LAB — CMP (CANCER CENTER ONLY)
ALT(SGPT): 24 U/L (ref 10–47)
AST: 15 U/L (ref 11–38)
Calcium: 9.6 mg/dL (ref 8.0–10.3)
Chloride: 103 mEq/L (ref 98–108)
Creat: 1.4 mg/dl — ABNORMAL HIGH (ref 0.6–1.2)
Sodium: 143 mEq/L (ref 128–145)

## 2012-03-22 MED ORDER — AZITHROMYCIN 250 MG PO TABS: ORAL_TABLET | ORAL | Status: AC

## 2012-03-23 LAB — KAPPA/LAMBDA LIGHT CHAINS
Kappa free light chain: 2 mg/dL — ABNORMAL HIGH (ref 0.33–1.94)
Kappa:Lambda Ratio: 2.6 — ABNORMAL HIGH (ref 0.26–1.65)
Lambda Free Lght Chn: 0.77 mg/dL (ref 0.57–2.63)

## 2012-03-23 LAB — FERRITIN: Ferritin: 1546 ng/mL — ABNORMAL HIGH (ref 10–291)

## 2012-03-23 LAB — IRON AND TIBC
%SAT: 31 % (ref 20–55)
TIBC: 192 ug/dL — ABNORMAL LOW (ref 250–470)

## 2012-03-24 ENCOUNTER — Other Ambulatory Visit: Payer: Self-pay | Admitting: *Deleted

## 2012-03-24 MED ORDER — POMALIDOMIDE 4 MG PO CAPS: 4.0000 mg | ORAL_CAPSULE | Freq: Every morning | ORAL | Status: DC

## 2012-03-24 NOTE — Telephone Encounter (Signed)
Received refill request from [facility] for Pomalyst. To continue on 3 mg per day per Dr Myna Hidalgo. Faxed back to [phone].

## 2012-03-27 ENCOUNTER — Other Ambulatory Visit: Payer: Self-pay | Admitting: Hematology & Oncology

## 2012-03-29 ENCOUNTER — Other Ambulatory Visit (HOSPITAL_BASED_OUTPATIENT_CLINIC_OR_DEPARTMENT_OTHER): Payer: Medicare Other | Admitting: Lab

## 2012-03-29 ENCOUNTER — Other Ambulatory Visit: Payer: Self-pay | Admitting: *Deleted

## 2012-03-29 ENCOUNTER — Other Ambulatory Visit: Payer: Medicare Other | Admitting: Lab

## 2012-03-29 DIAGNOSIS — C9000 Multiple myeloma not having achieved remission: Secondary | ICD-10-CM

## 2012-03-29 DIAGNOSIS — R2681 Unsteadiness on feet: Secondary | ICD-10-CM

## 2012-03-29 DIAGNOSIS — N189 Chronic kidney disease, unspecified: Secondary | ICD-10-CM

## 2012-03-29 DIAGNOSIS — R5383 Other fatigue: Secondary | ICD-10-CM

## 2012-03-29 DIAGNOSIS — D509 Iron deficiency anemia, unspecified: Secondary | ICD-10-CM

## 2012-03-29 LAB — CBC WITH DIFFERENTIAL (CANCER CENTER ONLY)
BASO%: 0.4 % (ref 0.0–2.0)
EOS%: 8.2 % — ABNORMAL HIGH (ref 0.0–7.0)
LYMPH#: 1.2 10*3/uL (ref 0.9–3.3)
LYMPH%: 24.1 % (ref 14.0–48.0)
MCHC: 32.5 g/dL (ref 32.0–36.0)
MONO#: 0.6 10*3/uL (ref 0.1–0.9)
Platelets: 189 10*3/uL (ref 145–400)
RDW: 19.1 % — ABNORMAL HIGH (ref 11.1–15.7)
WBC: 4.8 10*3/uL (ref 3.9–10.0)

## 2012-03-30 LAB — COMPREHENSIVE METABOLIC PANEL
ALT: 13 U/L (ref 0–35)
AST: 14 U/L (ref 0–37)
CO2: 29 mEq/L (ref 19–32)
Chloride: 102 mEq/L (ref 96–112)
Creatinine, Ser: 1.5 mg/dL — ABNORMAL HIGH (ref 0.50–1.10)
Sodium: 141 mEq/L (ref 135–145)
Total Bilirubin: 0.4 mg/dL (ref 0.3–1.2)
Total Protein: 5.9 g/dL — ABNORMAL LOW (ref 6.0–8.3)

## 2012-04-05 ENCOUNTER — Ambulatory Visit (HOSPITAL_BASED_OUTPATIENT_CLINIC_OR_DEPARTMENT_OTHER)
Admission: RE | Admit: 2012-04-05 | Discharge: 2012-04-05 | Disposition: A | Discharge status: Home / Self Care | Payer: Medicare Other | Source: Ambulatory Visit | Attending: Hematology & Oncology | Admitting: Hematology & Oncology

## 2012-04-05 ENCOUNTER — Other Ambulatory Visit: Payer: Medicare Other | Admitting: Lab

## 2012-04-05 ENCOUNTER — Ambulatory Visit (HOSPITAL_BASED_OUTPATIENT_CLINIC_OR_DEPARTMENT_OTHER): Payer: Medicare Other

## 2012-04-05 ENCOUNTER — Other Ambulatory Visit (HOSPITAL_BASED_OUTPATIENT_CLINIC_OR_DEPARTMENT_OTHER): Payer: Medicare Other | Admitting: Lab

## 2012-04-05 ENCOUNTER — Ambulatory Visit (HOSPITAL_BASED_OUTPATIENT_CLINIC_OR_DEPARTMENT_OTHER): Payer: Medicare Other | Admitting: Hematology & Oncology

## 2012-04-05 VITALS — BP 115/64 | HR 83 | Temp 98.7°F | Ht 60.0 in | Wt 132.0 lb

## 2012-04-05 DIAGNOSIS — D801 Nonfamilial hypogammaglobulinemia: Secondary | ICD-10-CM

## 2012-04-05 DIAGNOSIS — D631 Anemia in chronic kidney disease: Secondary | ICD-10-CM

## 2012-04-05 DIAGNOSIS — D649 Anemia, unspecified: Secondary | ICD-10-CM

## 2012-04-05 DIAGNOSIS — N289 Disorder of kidney and ureter, unspecified: Secondary | ICD-10-CM

## 2012-04-05 DIAGNOSIS — R0989 Other specified symptoms and signs involving the circulatory and respiratory systems: Secondary | ICD-10-CM | POA: Insufficient documentation

## 2012-04-05 DIAGNOSIS — R059 Cough, unspecified: Secondary | ICD-10-CM | POA: Insufficient documentation

## 2012-04-05 DIAGNOSIS — T451X5A Adverse effect of antineoplastic and immunosuppressive drugs, initial encounter: Secondary | ICD-10-CM

## 2012-04-05 DIAGNOSIS — D630 Anemia in neoplastic disease: Secondary | ICD-10-CM

## 2012-04-05 DIAGNOSIS — D6481 Anemia due to antineoplastic chemotherapy: Secondary | ICD-10-CM

## 2012-04-05 DIAGNOSIS — R05 Cough: Secondary | ICD-10-CM | POA: Insufficient documentation

## 2012-04-05 DIAGNOSIS — C9000 Multiple myeloma not having achieved remission: Secondary | ICD-10-CM | POA: Insufficient documentation

## 2012-04-05 DIAGNOSIS — IMO0002 Reserved for concepts with insufficient information to code with codable children: Secondary | ICD-10-CM | POA: Insufficient documentation

## 2012-04-05 DIAGNOSIS — N189 Chronic kidney disease, unspecified: Secondary | ICD-10-CM

## 2012-04-05 LAB — CBC WITH DIFFERENTIAL (CANCER CENTER ONLY)
BASO%: 1.1 % (ref 0.0–2.0)
Eosinophils Absolute: 0.5 10*3/uL (ref 0.0–0.5)
HCT: 33.2 % — ABNORMAL LOW (ref 34.8–46.6)
LYMPH#: 0.9 10*3/uL (ref 0.9–3.3)
LYMPH%: 18.3 % (ref 14.0–48.0)
MCV: 104 fL — ABNORMAL HIGH (ref 81–101)
MONO#: 0.4 10*3/uL (ref 0.1–0.9)
Platelets: 189 10*3/uL (ref 145–400)
RBC: 3.2 10*6/uL — ABNORMAL LOW (ref 3.70–5.32)
WBC: 4.7 10*3/uL (ref 3.9–10.0)

## 2012-04-05 MED ORDER — DARBEPOETIN ALFA-POLYSORBATE 300 MCG/0.6ML IJ SOLN: 300.0000 ug | Freq: Once | INTRAMUSCULAR | Status: DC

## 2012-04-05 MED ORDER — DARBEPOETIN ALFA-POLYSORBATE 300 MCG/0.6ML IJ SOLN
300.0000 ug | Freq: Once | INTRAMUSCULAR | Status: DC
  Administered 2012-04-05: 300 ug via SUBCUTANEOUS

## 2012-04-05 NOTE — Progress Notes (Signed)
This office note has been dictated.

## 2012-04-06 LAB — COMPREHENSIVE METABOLIC PANEL
ALT: 17 U/L (ref 0–35)
Albumin: 3.9 g/dL (ref 3.5–5.2)
CO2: 26 mEq/L (ref 19–32)
Calcium: 9.4 mg/dL (ref 8.4–10.5)
Chloride: 105 mEq/L (ref 96–112)
Glucose, Bld: 104 mg/dL — ABNORMAL HIGH (ref 70–99)
Potassium: 4.5 mEq/L (ref 3.5–5.3)
Sodium: 142 mEq/L (ref 135–145)
Total Bilirubin: 0.4 mg/dL (ref 0.3–1.2)
Total Protein: 5.9 g/dL — ABNORMAL LOW (ref 6.0–8.3)

## 2012-04-06 LAB — IRON AND TIBC
%SAT: 39 % (ref 20–55)
Iron: 86 ug/dL (ref 42–145)
UIBC: 136 ug/dL (ref 125–400)

## 2012-04-06 LAB — KAPPA/LAMBDA LIGHT CHAINS
Kappa free light chain: 1.81 mg/dL (ref 0.33–1.94)
Lambda Free Lght Chn: 0.03 mg/dL — ABNORMAL LOW (ref 0.57–2.63)

## 2012-04-06 LAB — FERRITIN: Ferritin: 3400 ng/mL — ABNORMAL HIGH (ref 10–291)

## 2012-04-06 NOTE — Progress Notes (Signed)
DIAGNOSES: 1. 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 3. 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  CURRENT THERAPY: 1. Pomalidomide 3 mg p.o. daily (21 days on/7 days off). 2. Decadron 20 mg p.o. q. week. 3. Zometa 3.3 mg IV q. month. 4. IVIG 40 g IV q. month.  INTERIM HISTORY:  [name] comes in for followup.  She is actually looking quite good.  She sounds stronger.  She looks better.  Hopefully, the pomalidomide is giving [place] some response now.  When we last checked her kappa light chain level, it was 1.04 mg/dL. She has not had any problems with bleeding.  She has had a little bit of "congestion."  She feels mostly this on the right side.  Will get a chest x-ray on her.  She was taking physical therapy.  I am not sure if she had to stop this because her benefits ran out.  We will have to find out about this.  She is on aspirin.  She is doing well on the aspirin.  This is as a DVT prophylactic.  Her appetite is improving.  She has had no nausea or vomiting.  She has had no diarrhea.  PHYSICAL EXAMINATION:  This is a somewhat petite white female who looks much more spry.  Vital signs:  Temperature of 98.5, pulse 83, respiratory rate 20, blood for blood pressure 115/64.  Weight is 132. Head and neck: Normocephalic, atraumatic skull.  There are no ocular or oral lesions.  There are no palpable cervical or supraclavicular lymph nodes.  Lungs:  Clear bilaterally.  There may be some slight crackles in the right lung.  Cardiac:  Regular rate and rhythm with a normal S1 and S2.  There are no murmurs, rubs or bruits.  Abdomen:  Soft with good bowel sounds.  There is no fluid wave.  There is no palpable abdominal mass.  There is no palpable hepatosplenomegaly.  Extremities:  No clubbing, cyanosis or edema.  Skin:  No rashes, ecchymosis or petechia.  LABORATORY STUDIES:  White cell count is 4.7, hemoglobin 10.9, hematocrit 33.2,  platelet count 189.  IMPRESSION:  [name] is a 66 year old white female with recurrent kappa light chain myeloma.  She is now on pomalidomide/Decadron.  She is doing very well with this.  We do need to set her up with IVIG and Zometa.  We will get this set up for a couple weeks.  We will give her Aredia today.  I want to try to make sure that her hemoglobin is maintained.  Again, will plan for a bone marrow after 4 cycles.  We really cannot assess her response to well as she does not make a lot of light chain in her serum or urine.  I am just glad to see that her quality of life is better.  This is important for [name].  I will see [name] back myself in another 6 weeks.    ______________________________ Josph Macho, M.D. PRE/MEDQ  D:  04/05/2012  T:  04/06/2012  Job:  [id number]

## 2012-04-07 ENCOUNTER — Telehealth: Payer: Self-pay | Admitting: *Deleted

## 2012-04-07 NOTE — Telephone Encounter (Addendum)
Message copied by Wynonia Hazard on Fri Apr 07, 2012  9:46 AM ------      Message from: Josph Macho      Created: Fri Apr 07, 2012  7:47 AM       Call and tell her that her chest x-ray is clear with no pneumonia. Pete  Spoke to pt yesterday. She was given the results.

## 2012-04-10 ENCOUNTER — Ambulatory Visit: Payer: Medicare Other | Admitting: Hematology & Oncology

## 2012-04-10 ENCOUNTER — Other Ambulatory Visit: Payer: Medicare Other | Admitting: Lab

## 2012-04-10 ENCOUNTER — Ambulatory Visit: Payer: Medicare Other

## 2012-04-12 ENCOUNTER — Other Ambulatory Visit: Payer: Medicare Other | Admitting: Lab

## 2012-04-19 ENCOUNTER — Other Ambulatory Visit (HOSPITAL_BASED_OUTPATIENT_CLINIC_OR_DEPARTMENT_OTHER): Payer: Medicare Other | Admitting: Lab

## 2012-04-19 ENCOUNTER — Ambulatory Visit (HOSPITAL_BASED_OUTPATIENT_CLINIC_OR_DEPARTMENT_OTHER): Payer: Medicare Other

## 2012-04-19 ENCOUNTER — Other Ambulatory Visit: Payer: Medicare Other | Admitting: Lab

## 2012-04-19 VITALS — BP 111/51 | HR 80 | Temp 98.1°F | Wt 132.0 lb

## 2012-04-19 DIAGNOSIS — C9000 Multiple myeloma not having achieved remission: Secondary | ICD-10-CM

## 2012-04-19 DIAGNOSIS — D801 Nonfamilial hypogammaglobulinemia: Secondary | ICD-10-CM

## 2012-04-19 DIAGNOSIS — N189 Chronic kidney disease, unspecified: Secondary | ICD-10-CM

## 2012-04-19 LAB — CBC WITH DIFFERENTIAL (CANCER CENTER ONLY)
BASO#: 0 10*3/uL (ref 0.0–0.2)
Eosinophils Absolute: 1.2 10*3/uL — ABNORMAL HIGH (ref 0.0–0.5)
HCT: 34 % — ABNORMAL LOW (ref 34.8–46.6)
HGB: 11.2 g/dL — ABNORMAL LOW (ref 11.6–15.9)
LYMPH%: 17.7 % (ref 14.0–48.0)
MCV: 106 fL — ABNORMAL HIGH (ref 81–101)
MONO#: 0.7 10*3/uL (ref 0.1–0.9)
NEUT%: 42.3 % (ref 39.6–80.0)
RBC: 3.2 10*6/uL — ABNORMAL LOW (ref 3.70–5.32)
RDW: 19.7 % — ABNORMAL HIGH (ref 11.1–15.7)
WBC: 4.6 10*3/uL (ref 3.9–10.0)

## 2012-04-19 MED ORDER — SODIUM CHLORIDE 0.9 % IV SOLN
Freq: Once | INTRAVENOUS | Status: AC
  Administered 2012-04-19: 10:00:00 via INTRAVENOUS

## 2012-04-19 MED ORDER — SODIUM CHLORIDE 0.9 % IJ SOLN
10.0000 mL | INTRAMUSCULAR | Status: DC | PRN
  Administered 2012-04-19: 10 mL via INTRAVENOUS
  Filled 2012-04-19: qty 10

## 2012-04-19 MED ORDER — IMMUNE GLOBULIN (HUMAN) 10 GM/200ML IV SOLN: 40.0000 g | Freq: Once | INTRAVENOUS | Status: DC

## 2012-04-19 MED ORDER — ZOLEDRONIC ACID 4 MG/5ML IV CONC
3.3000 mg | Freq: Once | INTRAVENOUS | Status: AC
  Administered 2012-04-19: 3.3 mg via INTRAVENOUS
  Filled 2012-04-19: qty 4.13

## 2012-04-19 MED ORDER — HEPARIN SOD (PORK) LOCK FLUSH 100 UNIT/ML IV SOLN
500.0000 [IU] | Freq: Once | INTRAVENOUS | Status: AC
  Administered 2012-04-19: 500 [IU] via INTRAVENOUS
  Filled 2012-04-19: qty 5

## 2012-04-19 MED ORDER — DIPHENHYDRAMINE HCL 25 MG PO CAPS
25.0000 mg | ORAL_CAPSULE | Freq: Once | ORAL | Status: AC
  Administered 2012-04-19: 25 mg via ORAL

## 2012-04-19 MED ORDER — ACETAMINOPHEN 325 MG PO TABS
650.0000 mg | ORAL_TABLET | Freq: Once | ORAL | Status: AC
  Administered 2012-04-19: 650 mg via ORAL

## 2012-04-19 MED ORDER — IMMUNE GLOBULIN (HUMAN) 20 GM/200ML IV SOLN
40.0000 g | Freq: Once | INTRAVENOUS | Status: AC
  Administered 2012-04-19: 40 g via INTRAVENOUS
  Filled 2012-04-19: qty 400

## 2012-04-19 NOTE — Patient Instructions (Signed)
Zoledronic Acid injection (Hypercalcemia, Oncology) What is this medicine? ZOLEDRONIC ACID (ZOE le dron ik AS id) lowers the amount of calcium loss from bone. It is used to treat too much calcium in your blood from cancer. It is also used to prevent complications of cancer that has spread to the bone. This medicine may be used for other purposes; ask your health care provider or pharmacist if you have questions. What should I tell my health care provider before I take this medicine? They need to know if you have any of these conditions: -aspirin-sensitive asthma -dental disease -kidney disease -an unusual or allergic reaction to zoledronic acid, other medicines, foods, dyes, or preservatives -pregnant or trying to get pregnant -breast-feeding How should I use this medicine? This medicine is for infusion into a vein. It is given by a health care professional in a hospital or clinic setting. Talk to your pediatrician regarding the use of this medicine in children. Special care may be needed. Overdosage: If you think you have taken too much of this medicine contact a poison control center or emergency room at once. NOTE: This medicine is only for you. Do not share this medicine with others. What if I miss a dose? It is important not to miss your dose. Call your doctor or health care professional if you are unable to keep an appointment. What may interact with this medicine? -certain antibiotics given by injection -NSAIDs, medicines for pain and inflammation, like ibuprofen or naproxen -some diuretics like bumetanide, furosemide -teriparatide -thalidomide This list may not describe all possible interactions. Give your health care provider a list of all the medicines, herbs, non-prescription drugs, or dietary supplements you use. Also tell them if you smoke, drink alcohol, or use illegal drugs. Some items may interact with your medicine. What should I watch for while using this medicine? Visit  your doctor or health care professional for regular checkups. It may be some time before you see the benefit from this medicine. Do not stop taking your medicine unless your doctor tells you to. Your doctor may order blood tests or other tests to see how you are doing. Women should inform their doctor if they wish to become pregnant or think they might be pregnant. There is a potential for serious side effects to an unborn child. Talk to your health care professional or pharmacist for more information. You should make sure that you get enough calcium and vitamin D while you are taking this medicine. Discuss the foods you eat and the vitamins you take with your health care professional. Some people who take this medicine have severe bone, joint, and/or muscle pain. This medicine may also increase your risk for a broken thigh bone. Tell your doctor right away if you have pain in your upper leg or groin. Tell your doctor if you have any pain that does not go away or that gets worse. What side effects may I notice from receiving this medicine? Side effects that you should report to your doctor or health care professional as soon as possible: -allergic reactions like skin rash, itching or hives, swelling of the face, lips, or tongue -anxiety, confusion, or depression -breathing problems -changes in vision -feeling faint or lightheaded, falls -jaw burning, cramping, pain -muscle cramps, stiffness, or weakness -trouble passing urine or change in the amount of urine Side effects that usually do not require medical attention (report to your doctor or health care professional if they continue or are bothersome): -bone, joint, or muscle pain -  fever -hair loss -irritation at site where injected -loss of appetite -nausea, vomiting -stomach upset -tired This list may not describe all possible side effects. Call your doctor for medical advice about side effects. You may report side effects to FDA at  1-800-FDA-1088. Where should I keep my medicine? This drug is given in a hospital or clinic and will not be stored at home. NOTE: This sheet is a summary. It may not cover all possible information. If you have questions about this medicine, talk to your doctor, pharmacist, or health care provider.  2012, Elsevier/Gold Standard. (04/02/2011 9:06:58 AM)Immune Globulin Injection What is this medicine? IMMUNE GLOBULIN (im MUNE GLOB yoo lin) helps to prevent or reduce the severity of certain infections in patients who are at risk. This medicine is collected from the pooled blood of many donors. It is used to treat immune system problems, thrombocytopenia, and Kawasaki syndrome. This medicine may be used for other purposes; ask your health care provider or pharmacist if you have questions. What should I tell my health care provider before I take this medicine? They need to know if you have any of these conditions: - diabetes - extremely low or no immune antibodies in the blood - heart disease - history of blood clots - hyperprolinemia - infection in the blood, sepsis - kidney disease - taking medicine that may change kidney function - ask your health care provider about your medicine - an unusual or allergic reaction to human immune globulin, albumin, maltose, sucrose, polysorbate 80, other medicines, foods, dyes, or preservatives - pregnant or trying to get pregnant - breast-feeding How should I use this medicine? This medicine is for injection into a muscle or infusion into a vein or skin. It is usually given by a health care professional in a hospital or clinic setting. In rare cases, some brands of this medicine might be given at home. You will be taught how to give this medicine. Use exactly as directed. Take your medicine at regular intervals. Do not take your medicine more often than directed. Talk to your pediatrician regarding the use of this medicine in children. Special care  may be needed. Overdosage: If you think you have taken too much of this medicine contact a poison control center or emergency room at once. NOTE: This medicine is only for you. Do not share this medicine with others. What if I miss a dose? It is important not to miss your dose. Call your doctor or health care professional if you are unable to keep an appointment. If you give yourself the medicine and you miss a dose, take it as soon as you can. If it is almost time for your next dose, take only that dose. Do not take double or extra doses. What may interact with this medicine? -aspirin and aspirin-like medicines -cisplatin -cyclosporine -medicines for infection like acyclovir, adefovir, amphotericin B, bacitracin, cidofovir, foscarnet, ganciclovir, gentamicin, pentamidine, vancomycin -NSAIDS, medicines for pain and inflammation, like ibuprofen or naproxen -pamidronate -vaccines -zoledronic acid This list may not describe all possible interactions. Give your health care provider a list of all the medicines, herbs, non-prescription drugs, or dietary supplements you use. Also tell them if you smoke, drink alcohol, or use illegal drugs. Some items may interact with your medicine. What should I watch for while using this medicine? Your condition will be monitored carefully while you are receiving this medicine. This medicine is made from pooled blood donations of many different people. It may be possible to pass an   infection in this medicine. However, the donors are screened for infections and all products are tested for HIV and hepatitis. The medicine is treated to kill most or all bacteria and viruses. Talk to your doctor about the risks and benefits of this medicine. Do not have vaccinations for at least 14 days before, or until at least 3 months after receiving this medicine. What side effects may I notice from receiving this medicine? Side effects that you should report to your doctor or health  care professional as soon as possible: -allergic reactions like skin rash, itching or hives, swelling of the face, lips, or tongue -breathing problems -chest pain or tightness -fever, chills -headache with nausea, vomiting -neck pain or difficulty moving neck -pain when moving eyes -pain, swelling, warmth in the leg -problems with balance, talking, walking -sudden weight gain -swelling of the ankles, feet, hands -trouble passing urine or change in the amount of urine Side effects that usually do not require medical attention (report to your doctor or health care professional if they continue or are bothersome): -dizzy, drowsy -flushing -increased sweating -leg cramps -muscle aches and pains -pain at site where injected This list may not describe all possible side effects. Call your doctor for medical advice about side effects. You may report side effects to FDA at 1-800-FDA-1088. Where should I keep my medicine? Keep out of the reach of children. This drug is usually given in a hospital or clinic and will not be stored at home. In rare cases, some brands of this medicine may be given at home. If you are using this medicine at home, you will be instructed on how to store this medicine. Throw away any unused medicine after the expiration date on the label. NOTE: This sheet is a summary. It may not cover all possible information. If you have questions about this medicine, talk to your doctor, pharmacist, or health care provider.  2012, Elsevier/Gold Standard. (12/25/2008 11:44:49 AM) 

## 2012-04-21 LAB — KAPPA/LAMBDA LIGHT CHAINS
Kappa free light chain: 1.82 mg/dL (ref 0.33–1.94)
Lambda Free Lght Chn: 1.32 mg/dL (ref 0.57–2.63)

## 2012-04-21 LAB — COMPREHENSIVE METABOLIC PANEL
BUN: 20 mg/dL (ref 6–23)
CO2: 23 mEq/L (ref 19–32)
Creatinine, Ser: 1.24 mg/dL — ABNORMAL HIGH (ref 0.50–1.10)
Glucose, Bld: 167 mg/dL — ABNORMAL HIGH (ref 70–99)
Total Bilirubin: 0.3 mg/dL (ref 0.3–1.2)

## 2012-04-26 ENCOUNTER — Other Ambulatory Visit: Payer: Self-pay | Admitting: Hematology & Oncology

## 2012-04-26 ENCOUNTER — Other Ambulatory Visit: Payer: Self-pay | Admitting: *Deleted

## 2012-04-26 ENCOUNTER — Other Ambulatory Visit: Payer: Medicare Other | Admitting: Lab

## 2012-04-26 NOTE — Telephone Encounter (Signed)
error 

## 2012-04-27 ENCOUNTER — Ambulatory Visit: Payer: Medicare Other

## 2012-04-27 ENCOUNTER — Other Ambulatory Visit: Payer: Self-pay | Admitting: *Deleted

## 2012-04-27 DIAGNOSIS — C9000 Multiple myeloma not having achieved remission: Secondary | ICD-10-CM

## 2012-04-27 MED ORDER — POMALIDOMIDE 3 MG PO CAPS: 3.0000 mg | ORAL_CAPSULE | Freq: Every morning | ORAL | Status: DC

## 2012-04-27 NOTE — Telephone Encounter (Signed)
Received refill request from [facility] for Pomalyst. To continue on 3 mg per day per Dr Myna Hidalgo. 21 days on 7 days off faxed back to [phone]. Authorization # [id number]

## 2012-05-01 ENCOUNTER — Other Ambulatory Visit: Payer: Self-pay | Admitting: *Deleted

## 2012-05-01 ENCOUNTER — Ambulatory Visit: Payer: Medicare Other | Attending: Hematology & Oncology

## 2012-05-01 DIAGNOSIS — M25659 Stiffness of unspecified hip, not elsewhere classified: Secondary | ICD-10-CM | POA: Insufficient documentation

## 2012-05-01 DIAGNOSIS — IMO0001 Reserved for inherently not codable concepts without codable children: Secondary | ICD-10-CM | POA: Insufficient documentation

## 2012-05-01 DIAGNOSIS — R5381 Other malaise: Secondary | ICD-10-CM | POA: Insufficient documentation

## 2012-05-01 DIAGNOSIS — C9000 Multiple myeloma not having achieved remission: Secondary | ICD-10-CM

## 2012-05-01 DIAGNOSIS — M25569 Pain in unspecified knee: Secondary | ICD-10-CM | POA: Insufficient documentation

## 2012-05-01 DIAGNOSIS — R451 Restlessness and agitation: Secondary | ICD-10-CM

## 2012-05-01 MED ORDER — TEMAZEPAM 15 MG PO CAPS: 15.0000 mg | ORAL_CAPSULE | Freq: Every evening | ORAL | Status: DC | PRN

## 2012-05-01 MED ORDER — HYDROCODONE-ACETAMINOPHEN 5-325 MG PO TABS: ORAL_TABLET | ORAL | Status: DC

## 2012-05-02 ENCOUNTER — Encounter: Payer: Self-pay | Admitting: *Deleted

## 2012-05-02 NOTE — Progress Notes (Signed)
Received prescription shipment confirmation that her Pomalyst was shipped on 05/01/12

## 2012-05-03 ENCOUNTER — Other Ambulatory Visit: Payer: Self-pay | Admitting: *Deleted

## 2012-05-03 ENCOUNTER — Other Ambulatory Visit: Payer: Medicare Other | Admitting: Lab

## 2012-05-03 DIAGNOSIS — C9000 Multiple myeloma not having achieved remission: Secondary | ICD-10-CM

## 2012-05-03 MED ORDER — HYDROCODONE-ACETAMINOPHEN 5-325 MG PO TABS: ORAL_TABLET | ORAL | Status: DC

## 2012-05-04 ENCOUNTER — Ambulatory Visit: Payer: Medicare Other | Admitting: Physical Therapy

## 2012-05-04 ENCOUNTER — Other Ambulatory Visit: Payer: Self-pay | Admitting: Hematology & Oncology

## 2012-05-08 ENCOUNTER — Ambulatory Visit: Payer: Medicare Other | Admitting: Physical Therapy

## 2012-05-08 ENCOUNTER — Ambulatory Visit: Payer: Medicare Other

## 2012-05-08 ENCOUNTER — Other Ambulatory Visit: Payer: Medicare Other | Admitting: Lab

## 2012-05-10 ENCOUNTER — Other Ambulatory Visit: Payer: Medicare Other | Admitting: Lab

## 2012-05-16 ENCOUNTER — Other Ambulatory Visit: Payer: Self-pay | Admitting: Hematology & Oncology

## 2012-05-17 ENCOUNTER — Other Ambulatory Visit (HOSPITAL_BASED_OUTPATIENT_CLINIC_OR_DEPARTMENT_OTHER): Payer: Medicare Other | Admitting: Lab

## 2012-05-17 ENCOUNTER — Telehealth: Payer: Self-pay | Admitting: Hematology & Oncology

## 2012-05-17 ENCOUNTER — Ambulatory Visit: Payer: Medicare Other | Admitting: Hematology & Oncology

## 2012-05-17 ENCOUNTER — Ambulatory Visit: Payer: Medicare Other

## 2012-05-17 NOTE — Telephone Encounter (Signed)
Patient called and cx appt for today due to being sick.  She stated she will call back to resch.  RN was notified of cx appt

## 2012-05-22 ENCOUNTER — Ambulatory Visit (HOSPITAL_BASED_OUTPATIENT_CLINIC_OR_DEPARTMENT_OTHER): Payer: Medicare Other

## 2012-05-22 VITALS — BP 103/56 | HR 71 | Temp 97.5°F | Resp 16

## 2012-05-22 DIAGNOSIS — C9000 Multiple myeloma not having achieved remission: Secondary | ICD-10-CM

## 2012-05-22 DIAGNOSIS — D801 Nonfamilial hypogammaglobulinemia: Secondary | ICD-10-CM

## 2012-05-22 DIAGNOSIS — N289 Disorder of kidney and ureter, unspecified: Secondary | ICD-10-CM

## 2012-05-22 DIAGNOSIS — D631 Anemia in chronic kidney disease: Secondary | ICD-10-CM

## 2012-05-22 DIAGNOSIS — D649 Anemia, unspecified: Secondary | ICD-10-CM

## 2012-05-22 LAB — CBC WITH DIFFERENTIAL (CANCER CENTER ONLY)
BASO%: 0.3 % (ref 0.0–2.0)
Eosinophils Absolute: 0.9 10*3/uL — ABNORMAL HIGH (ref 0.0–0.5)
MONO#: 0.6 10*3/uL (ref 0.1–0.9)
MONO%: 16.7 % — ABNORMAL HIGH (ref 0.0–13.0)
NEUT#: 1.4 10*3/uL — ABNORMAL LOW (ref 1.5–6.5)
Platelets: 104 10*3/uL — ABNORMAL LOW (ref 145–400)
RBC: 1.99 10*6/uL — ABNORMAL LOW (ref 3.70–5.32)
RDW: 17.4 % — ABNORMAL HIGH (ref 11.1–15.7)
WBC: 3.4 10*3/uL — ABNORMAL LOW (ref 3.9–10.0)

## 2012-05-22 LAB — TECHNOLOGIST REVIEW CHCC SATELLITE

## 2012-05-22 MED ORDER — SODIUM CHLORIDE 0.9 % IV SOLN
3.3000 mg | Freq: Once | INTRAVENOUS | Status: AC
  Administered 2012-05-22: 3.3 mg via INTRAVENOUS
  Filled 2012-05-22: qty 4.13

## 2012-05-22 MED ORDER — DARBEPOETIN ALFA-POLYSORBATE 300 MCG/0.6ML IJ SOLN
300.0000 ug | Freq: Once | INTRAMUSCULAR | Status: AC
  Administered 2012-05-22: 300 ug via SUBCUTANEOUS

## 2012-05-22 MED ORDER — DIPHENHYDRAMINE HCL 25 MG PO CAPS
25.0000 mg | ORAL_CAPSULE | Freq: Once | ORAL | Status: AC
  Administered 2012-05-22: 25 mg via ORAL

## 2012-05-22 MED ORDER — SODIUM CHLORIDE 0.9 % IJ SOLN
10.0000 mL | INTRAMUSCULAR | Status: DC | PRN
  Administered 2012-05-22: 10 mL via INTRAVENOUS
  Filled 2012-05-22: qty 10

## 2012-05-22 MED ORDER — IMMUNE GLOBULIN (HUMAN) 20 GM/200ML IV SOLN
0.7000 g/kg | Freq: Once | INTRAVENOUS | Status: AC
  Administered 2012-05-22: 40 g via INTRAVENOUS
  Filled 2012-05-22: qty 400

## 2012-05-22 MED ORDER — IMMUNE GLOBULIN (HUMAN) 10 GM/200ML IV SOLN: 40.0000 g | Freq: Once | INTRAVENOUS | Status: DC

## 2012-05-22 MED ORDER — HEPARIN SOD (PORK) LOCK FLUSH 100 UNIT/ML IV SOLN
500.0000 [IU] | Freq: Once | INTRAVENOUS | Status: AC
  Administered 2012-05-22: 500 [IU] via INTRAVENOUS
  Filled 2012-05-22: qty 5

## 2012-05-22 MED ORDER — IMMUNE GLOBULIN (HUMAN) 10 GM/200ML IV SOLN
40.0000 g | Freq: Once | INTRAVENOUS | Status: DC
  Filled 2012-05-22: qty 800

## 2012-05-22 MED ORDER — ACETAMINOPHEN 325 MG PO TABS
650.0000 mg | ORAL_TABLET | Freq: Once | ORAL | Status: AC
  Administered 2012-05-22: 650 mg via ORAL

## 2012-05-22 NOTE — Patient Instructions (Addendum)

## 2012-05-23 ENCOUNTER — Ambulatory Visit: Payer: Medicare Other | Attending: Hematology & Oncology

## 2012-05-23 DIAGNOSIS — M25659 Stiffness of unspecified hip, not elsewhere classified: Secondary | ICD-10-CM | POA: Insufficient documentation

## 2012-05-23 DIAGNOSIS — R5381 Other malaise: Secondary | ICD-10-CM | POA: Insufficient documentation

## 2012-05-23 DIAGNOSIS — IMO0001 Reserved for inherently not codable concepts without codable children: Secondary | ICD-10-CM | POA: Insufficient documentation

## 2012-05-23 DIAGNOSIS — M25569 Pain in unspecified knee: Secondary | ICD-10-CM | POA: Insufficient documentation

## 2012-05-24 LAB — PROTEIN ELECTROPHORESIS, SERUM, WITH REFLEX
Albumin ELP: 61.2 % (ref 55.8–66.1)
Alpha-1-Globulin: 6.3 % — ABNORMAL HIGH (ref 2.9–4.9)
Alpha-2-Globulin: 13.8 % — ABNORMAL HIGH (ref 7.1–11.8)
Gamma Globulin: 8 % — ABNORMAL LOW (ref 11.1–18.8)

## 2012-05-24 LAB — COMPREHENSIVE METABOLIC PANEL
ALT: 14 U/L (ref 0–35)
Albumin: 3.2 g/dL — ABNORMAL LOW (ref 3.5–5.2)
Alkaline Phosphatase: 29 U/L — ABNORMAL LOW (ref 39–117)
CO2: 27 mEq/L (ref 19–32)
Glucose, Bld: 140 mg/dL — ABNORMAL HIGH (ref 70–99)
Potassium: 4.2 mEq/L (ref 3.5–5.3)
Sodium: 141 mEq/L (ref 135–145)
Total Protein: 4.9 g/dL — ABNORMAL LOW (ref 6.0–8.3)

## 2012-05-24 LAB — LACTATE DEHYDROGENASE: LDH: 103 U/L (ref 94–250)

## 2012-05-24 LAB — IGG, IGA, IGM: IgA: 23 mg/dL — ABNORMAL LOW (ref 69–380)

## 2012-05-24 LAB — KAPPA/LAMBDA LIGHT CHAINS
Kappa:Lambda Ratio: 14.18 — ABNORMAL HIGH (ref 0.26–1.65)
Lambda Free Lght Chn: 0.11 mg/dL — ABNORMAL LOW (ref 0.57–2.63)

## 2012-05-26 ENCOUNTER — Other Ambulatory Visit: Payer: Self-pay | Admitting: *Deleted

## 2012-05-26 DIAGNOSIS — R05 Cough: Secondary | ICD-10-CM

## 2012-05-26 MED ORDER — HYDROCODONE-HOMATROPINE 5-1.5 MG/5ML PO SYRP: 5.0000 mL | ORAL_SOLUTION | Freq: Four times a day (QID) | ORAL | Status: DC | PRN

## 2012-05-30 ENCOUNTER — Ambulatory Visit: Payer: Medicare Other

## 2012-05-31 ENCOUNTER — Other Ambulatory Visit: Payer: Self-pay | Admitting: *Deleted

## 2012-05-31 DIAGNOSIS — R451 Restlessness and agitation: Secondary | ICD-10-CM

## 2012-06-01 ENCOUNTER — Ambulatory Visit: Payer: Medicare Other | Admitting: Physical Therapy

## 2012-06-01 MED ORDER — TEMAZEPAM 15 MG PO CAPS: 15.0000 mg | ORAL_CAPSULE | Freq: Every evening | ORAL | Status: DC | PRN

## 2012-06-01 NOTE — Telephone Encounter (Signed)
Received a refill request from [facility] for Restoril 30 mg qhs. This is a chronic med for the pt. Will route to Dr Myna Hidalgo for authorization then fax back to [facility] once approved.

## 2012-06-05 ENCOUNTER — Encounter: Payer: Self-pay | Admitting: *Deleted

## 2012-06-05 ENCOUNTER — Encounter (HOSPITAL_COMMUNITY)
Admission: RE | Admit: 2012-06-05 | Discharge: 2012-06-05 | Disposition: A | Discharge status: Home / Self Care | Payer: Medicare Other | Source: Ambulatory Visit | Attending: Hematology & Oncology | Admitting: Hematology & Oncology

## 2012-06-05 ENCOUNTER — Other Ambulatory Visit: Payer: Medicare Other | Admitting: Lab

## 2012-06-05 ENCOUNTER — Other Ambulatory Visit (HOSPITAL_BASED_OUTPATIENT_CLINIC_OR_DEPARTMENT_OTHER): Payer: Medicare Other | Admitting: Lab

## 2012-06-05 ENCOUNTER — Ambulatory Visit: Payer: Medicare Other | Admitting: Hematology & Oncology

## 2012-06-05 ENCOUNTER — Inpatient Hospital Stay (HOSPITAL_COMMUNITY): Payer: Medicare Other

## 2012-06-05 ENCOUNTER — Inpatient Hospital Stay (HOSPITAL_COMMUNITY)
Admission: AD | Admit: 2012-06-05 | Discharge: 2012-06-14 | DRG: 841 | Disposition: A | Discharge status: Home Health | Payer: Medicare Other | Source: Ambulatory Visit | Attending: Hematology & Oncology | Admitting: Hematology & Oncology

## 2012-06-05 ENCOUNTER — Encounter: Payer: Medicare Other | Admitting: Physical Therapy

## 2012-06-05 ENCOUNTER — Encounter (HOSPITAL_COMMUNITY): Payer: Self-pay | Admitting: *Deleted

## 2012-06-05 ENCOUNTER — Ambulatory Visit: Payer: Medicare Other

## 2012-06-05 VITALS — BP 121/54 | HR 101 | Temp 99.9°F | Resp 20 | Ht 60.0 in | Wt 141.0 lb

## 2012-06-05 DIAGNOSIS — R252 Cramp and spasm: Secondary | ICD-10-CM

## 2012-06-05 DIAGNOSIS — D649 Anemia, unspecified: Secondary | ICD-10-CM

## 2012-06-05 DIAGNOSIS — K219 Gastro-esophageal reflux disease without esophagitis: Secondary | ICD-10-CM

## 2012-06-05 DIAGNOSIS — R509 Fever, unspecified: Secondary | ICD-10-CM | POA: Diagnosis present

## 2012-06-05 DIAGNOSIS — E039 Hypothyroidism, unspecified: Secondary | ICD-10-CM | POA: Diagnosis present

## 2012-06-05 DIAGNOSIS — J449 Chronic obstructive pulmonary disease, unspecified: Secondary | ICD-10-CM | POA: Diagnosis present

## 2012-06-05 DIAGNOSIS — R05 Cough: Secondary | ICD-10-CM

## 2012-06-05 DIAGNOSIS — F3289 Other specified depressive episodes: Secondary | ICD-10-CM | POA: Diagnosis present

## 2012-06-05 DIAGNOSIS — C9000 Multiple myeloma not having achieved remission: Principal | ICD-10-CM | POA: Diagnosis present

## 2012-06-05 DIAGNOSIS — D630 Anemia in neoplastic disease: Secondary | ICD-10-CM | POA: Diagnosis present

## 2012-06-05 DIAGNOSIS — D631 Anemia in chronic kidney disease: Secondary | ICD-10-CM

## 2012-06-05 DIAGNOSIS — F329 Major depressive disorder, single episode, unspecified: Secondary | ICD-10-CM | POA: Diagnosis present

## 2012-06-05 DIAGNOSIS — K449 Diaphragmatic hernia without obstruction or gangrene: Secondary | ICD-10-CM | POA: Diagnosis present

## 2012-06-05 DIAGNOSIS — J4489 Other specified chronic obstructive pulmonary disease: Secondary | ICD-10-CM | POA: Diagnosis present

## 2012-06-05 DIAGNOSIS — K208 Other esophagitis without bleeding: Secondary | ICD-10-CM | POA: Diagnosis present

## 2012-06-05 DIAGNOSIS — N189 Chronic kidney disease, unspecified: Secondary | ICD-10-CM

## 2012-06-05 DIAGNOSIS — B3781 Candidal esophagitis: Secondary | ICD-10-CM | POA: Diagnosis present

## 2012-06-05 DIAGNOSIS — Z9484 Stem cells transplant status: Secondary | ICD-10-CM

## 2012-06-05 DIAGNOSIS — R5383 Other fatigue: Secondary | ICD-10-CM

## 2012-06-05 LAB — CBC WITH DIFFERENTIAL (CANCER CENTER ONLY)
BASO%: 0.3 % (ref 0.0–2.0)
EOS%: 3 % (ref 0.0–7.0)
HCT: 21.9 % — ABNORMAL LOW (ref 34.8–46.6)
LYMPH%: 7 % — ABNORMAL LOW (ref 14.0–48.0)
MCH: 35.6 pg — ABNORMAL HIGH (ref 26.0–34.0)
MCHC: 31.1 g/dL — ABNORMAL LOW (ref 32.0–36.0)
MCV: 115 fL — ABNORMAL HIGH (ref 81–101)
NEUT%: 86.4 % — ABNORMAL HIGH (ref 39.6–80.0)
RDW: 17.7 % — ABNORMAL HIGH (ref 11.1–15.7)

## 2012-06-05 LAB — CBC
HCT: 21.4 % — ABNORMAL LOW (ref 36.0–46.0)
MCV: 110.3 fL — ABNORMAL HIGH (ref 78.0–100.0)
RDW: 18 % — ABNORMAL HIGH (ref 11.5–15.5)
WBC: 7.4 10*3/uL (ref 4.0–10.5)

## 2012-06-05 LAB — TECHNOLOGIST REVIEW CHCC SATELLITE

## 2012-06-05 LAB — CREATININE, SERUM: GFR calc Af Amer: 48 mL/min — ABNORMAL LOW (ref >90)

## 2012-06-05 MED ORDER — PIRBUTEROL ACETATE 200 MCG/INH IN AERB: 2.0000 | INHALATION_SPRAY | Freq: Four times a day (QID) | RESPIRATORY_TRACT | Status: DC

## 2012-06-05 MED ORDER — OLOPATADINE HCL 0.1 % OP SOLN
1.0000 [drp] | Freq: Two times a day (BID) | OPHTHALMIC | Status: DC
  Administered 2012-06-05 – 2012-06-14 (×18): 1 [drp] via OPHTHALMIC
  Filled 2012-06-05 (×2): qty 5

## 2012-06-05 MED ORDER — FLUTICASONE-SALMETEROL 250-50 MCG/DOSE IN AEPB
1.0000 | INHALATION_SPRAY | Freq: Two times a day (BID) | RESPIRATORY_TRACT | Status: DC
  Administered 2012-06-05 – 2012-06-14 (×17): 1 via RESPIRATORY_TRACT
  Filled 2012-06-05 (×2): qty 14

## 2012-06-05 MED ORDER — PANTOPRAZOLE SODIUM 40 MG PO TBEC
80.0000 mg | DELAYED_RELEASE_TABLET | Freq: Every day | ORAL | Status: DC
  Administered 2012-06-06 – 2012-06-10 (×5): 80 mg via ORAL
  Filled 2012-06-05 (×6): qty 2

## 2012-06-05 MED ORDER — VITAMIN D (ERGOCALCIFEROL) 1.25 MG (50000 UNIT) PO CAPS
50000.0000 [IU] | ORAL_CAPSULE | ORAL | Status: DC
  Administered 2012-06-05 – 2012-06-12 (×2): 50000 [IU] via ORAL
  Filled 2012-06-05 (×3): qty 1

## 2012-06-05 MED ORDER — LORAZEPAM 1 MG PO TABS
1.0000 mg | ORAL_TABLET | Freq: Three times a day (TID) | ORAL | Status: DC | PRN
  Administered 2012-06-06 – 2012-06-13 (×7): 1 mg via ORAL
  Filled 2012-06-05 (×7): qty 1

## 2012-06-05 MED ORDER — LEVOTHYROXINE SODIUM 50 MCG PO TABS
50.0000 ug | ORAL_TABLET | Freq: Every day | ORAL | Status: DC
  Administered 2012-06-06 – 2012-06-14 (×9): 50 ug via ORAL
  Filled 2012-06-05 (×12): qty 1

## 2012-06-05 MED ORDER — ALBUTEROL SULFATE (5 MG/ML) 0.5% IN NEBU: 2.5000 mg | INHALATION_SOLUTION | Freq: Four times a day (QID) | RESPIRATORY_TRACT | Status: DC | PRN

## 2012-06-05 MED ORDER — ENOXAPARIN SODIUM 30 MG/0.3ML ~~LOC~~ SOLN
30.0000 mg | SUBCUTANEOUS | Status: DC
  Administered 2012-06-05: 30 mg via SUBCUTANEOUS
  Filled 2012-06-05 (×2): qty 0.3

## 2012-06-05 MED ORDER — POLYETHYLENE GLYCOL 3350 17 GM/SCOOP PO POWD
1.0000 | Freq: Every morning | ORAL | Status: DC
Start: 2012-06-06 — End: 2012-06-05

## 2012-06-05 MED ORDER — SODIUM CHLORIDE 0.9 % IV SOLN
INTRAVENOUS | Status: DC
  Administered 2012-06-05 – 2012-06-07 (×2): via INTRAVENOUS
  Administered 2012-06-07: 20 mL/h via INTRAVENOUS
  Administered 2012-06-09: 13:00:00 via INTRAVENOUS
  Administered 2012-06-11 (×2): 20 mL/h via INTRAVENOUS
  Administered 2012-06-12 (×2): via INTRAVENOUS

## 2012-06-05 MED ORDER — HYDROCODONE-HOMATROPINE 5-1.5 MG/5ML PO SYRP
5.0000 mL | ORAL_SOLUTION | Freq: Four times a day (QID) | ORAL | Status: DC | PRN
  Administered 2012-06-07 – 2012-06-11 (×5): 5 mL via ORAL
  Filled 2012-06-05 (×5): qty 5

## 2012-06-05 MED ORDER — ALBUTEROL SULFATE HFA 108 (90 BASE) MCG/ACT IN AERS
1.0000 | INHALATION_SPRAY | Freq: Four times a day (QID) | RESPIRATORY_TRACT | Status: DC
  Administered 2012-06-05: 1 via RESPIRATORY_TRACT
  Filled 2012-06-05: qty 6.7

## 2012-06-05 MED ORDER — HYDROCODONE-ACETAMINOPHEN 5-325 MG PO TABS
1.0000 | ORAL_TABLET | Freq: Four times a day (QID) | ORAL | Status: DC | PRN
  Administered 2012-06-05 – 2012-06-07 (×4): 1 via ORAL
  Filled 2012-06-05 (×5): qty 1

## 2012-06-05 MED ORDER — CELECOXIB 100 MG PO CAPS
100.0000 mg | ORAL_CAPSULE | Freq: Two times a day (BID) | ORAL | Status: DC
  Administered 2012-06-05 – 2012-06-14 (×18): 100 mg via ORAL
  Filled 2012-06-05 (×19): qty 1

## 2012-06-05 MED ORDER — ALPRAZOLAM 0.25 MG PO TABS
0.2500 mg | ORAL_TABLET | Freq: Three times a day (TID) | ORAL | Status: DC | PRN
  Administered 2012-06-06 – 2012-06-13 (×9): 0.25 mg via ORAL
  Filled 2012-06-05 (×9): qty 1

## 2012-06-05 MED ORDER — ACYCLOVIR 400 MG PO TABS
400.0000 mg | ORAL_TABLET | Freq: Two times a day (BID) | ORAL | Status: DC
  Administered 2012-06-05 – 2012-06-14 (×18): 400 mg via ORAL
  Filled 2012-06-05 (×20): qty 1

## 2012-06-05 MED ORDER — FESOTERODINE FUMARATE ER 4 MG PO TB24
4.0000 mg | ORAL_TABLET | Freq: Every day | ORAL | Status: DC
  Administered 2012-06-06 – 2012-06-14 (×9): 4 mg via ORAL
  Filled 2012-06-05 (×10): qty 1

## 2012-06-05 MED ORDER — ALBUTEROL SULFATE HFA 108 (90 BASE) MCG/ACT IN AERS
1.0000 | INHALATION_SPRAY | Freq: Four times a day (QID) | RESPIRATORY_TRACT | Status: DC
  Administered 2012-06-06 – 2012-06-08 (×9): 2 via RESPIRATORY_TRACT

## 2012-06-05 MED ORDER — LORAZEPAM 0.5 MG PO TABS
0.5000 mg | ORAL_TABLET | Freq: Once | ORAL | Status: AC
  Administered 2012-06-05: 0.5 mg via ORAL

## 2012-06-05 MED ORDER — MAGIC MOUTHWASH
5.0000 mL | Freq: Four times a day (QID) | ORAL | Status: DC | PRN
  Filled 2012-06-05: qty 5

## 2012-06-05 MED ORDER — ASPIRIN EC 81 MG PO TBEC
81.0000 mg | DELAYED_RELEASE_TABLET | Freq: Every day | ORAL | Status: DC
  Administered 2012-06-05 – 2012-06-11 (×7): 81 mg via ORAL
  Filled 2012-06-05 (×10): qty 1

## 2012-06-05 MED ORDER — POLYETHYLENE GLYCOL 3350 17 G PO PACK
17.0000 g | PACK | Freq: Every day | ORAL | Status: DC
  Administered 2012-06-06 – 2012-06-12 (×7): 17 g via ORAL
  Filled 2012-06-05 (×9): qty 1

## 2012-06-05 MED ORDER — HEPARIN SOD (PORK) LOCK FLUSH 100 UNIT/ML IV SOLN: 500.0000 [IU] | Freq: Every day | INTRAVENOUS | Status: DC | PRN

## 2012-06-05 MED ORDER — SODIUM CHLORIDE 0.9 % IJ SOLN: 10.0000 mL | INTRAMUSCULAR | Status: DC | PRN

## 2012-06-05 MED ORDER — DULOXETINE HCL 30 MG PO CPEP
30.0000 mg | ORAL_CAPSULE | Freq: Every day | ORAL | Status: DC
  Administered 2012-06-06 – 2012-06-14 (×9): 30 mg via ORAL
  Filled 2012-06-05 (×9): qty 1

## 2012-06-05 NOTE — H&P (Signed)
#   [id number] is dictation.

## 2012-06-06 ENCOUNTER — Inpatient Hospital Stay (HOSPITAL_COMMUNITY): Payer: Medicare Other

## 2012-06-06 ENCOUNTER — Encounter (HOSPITAL_COMMUNITY): Payer: Self-pay | Admitting: Radiology

## 2012-06-06 DIAGNOSIS — C9002 Multiple myeloma in relapse: Secondary | ICD-10-CM

## 2012-06-06 DIAGNOSIS — N289 Disorder of kidney and ureter, unspecified: Secondary | ICD-10-CM

## 2012-06-06 LAB — COMPREHENSIVE METABOLIC PANEL
ALT: 14 U/L (ref 0–35)
ALT: 16 U/L (ref 0–35)
AST: 14 U/L (ref 0–37)
AST: 18 U/L (ref 0–37)
BUN: 35 mg/dL — ABNORMAL HIGH (ref 6–23)
CO2: 25 mEq/L (ref 19–32)
Chloride: 102 mEq/L (ref 96–112)
Creatinine, Ser: 1.34 mg/dL — ABNORMAL HIGH (ref 0.50–1.10)
Creatinine, Ser: 1.32 mg/dL — ABNORMAL HIGH (ref 0.50–1.10)
GFR calc non Af Amer: 41 mL/min — ABNORMAL LOW (ref >90)
Sodium: 134 mEq/L — ABNORMAL LOW (ref 135–145)
Total Bilirubin: 0.3 mg/dL (ref 0.3–1.2)
Total Bilirubin: 0.7 mg/dL (ref 0.3–1.2)

## 2012-06-06 LAB — VITAMIN B12: Vitamin B-12: 261 pg/mL (ref 211–911)

## 2012-06-06 LAB — CBC WITH DIFFERENTIAL/PLATELET
Basophils Relative: 0 % (ref 0–1)
Eosinophils Relative: 2 % (ref 0–5)
Hemoglobin: 8.8 g/dL — ABNORMAL LOW (ref 12.0–15.0)
MCH: 33 pg (ref 26.0–34.0)
MCV: 98.9 fL (ref 78.0–100.0)
Monocytes Absolute: 0.2 10*3/uL (ref 0.1–1.0)
Monocytes Relative: 3 % (ref 3–12)
Neutrophils Relative %: 89 % — ABNORMAL HIGH (ref 43–77)
RBC: 2.67 MIL/uL — ABNORMAL LOW (ref 3.87–5.11)
WBC: 6.8 10*3/uL (ref 4.0–10.5)

## 2012-06-06 LAB — RETICULOCYTES (CHCC)
RBC.: 1.95 MIL/uL — ABNORMAL LOW (ref 3.87–5.11)
Retic Ct Pct: 5.1 % — ABNORMAL HIGH (ref 0.4–2.3)

## 2012-06-06 LAB — KAPPA/LAMBDA LIGHT CHAINS
Kappa free light chain: 2.13 mg/dL — ABNORMAL HIGH (ref 0.33–1.94)
Kappa:Lambda Ratio: 10.65 — ABNORMAL HIGH (ref 0.26–1.65)

## 2012-06-06 LAB — PROTIME-INR: Prothrombin Time: 14 seconds (ref 11.6–15.2)

## 2012-06-06 LAB — APTT: aPTT: 42 seconds — ABNORMAL HIGH (ref 24–37)

## 2012-06-06 MED ORDER — DIPHENHYDRAMINE HCL 50 MG/ML IJ SOLN
25.0000 mg | Freq: Once | INTRAMUSCULAR | Status: AC
  Administered 2012-06-06: 25 mg via INTRAVENOUS
  Filled 2012-06-06: qty 1

## 2012-06-06 MED ORDER — ENOXAPARIN SODIUM 40 MG/0.4ML ~~LOC~~ SOLN
40.0000 mg | SUBCUTANEOUS | Status: DC
  Administered 2012-06-06 – 2012-06-08 (×3): 40 mg via SUBCUTANEOUS
  Filled 2012-06-06 (×4): qty 0.4

## 2012-06-06 MED ORDER — MIDAZOLAM HCL 5 MG/5ML IJ SOLN
INTRAMUSCULAR | Status: AC | PRN
  Administered 2012-06-06 (×2): 1 mg via INTRAVENOUS

## 2012-06-06 MED ORDER — FENTANYL CITRATE 0.05 MG/ML IJ SOLN
INTRAMUSCULAR | Status: AC
  Filled 2012-06-06: qty 6

## 2012-06-06 MED ORDER — HYDROCODONE-ACETAMINOPHEN 5-325 MG PO TABS
1.0000 | ORAL_TABLET | Freq: Four times a day (QID) | ORAL | Status: DC | PRN
  Administered 2012-06-06 – 2012-06-07 (×2): 1 via ORAL
  Administered 2012-06-07 – 2012-06-08 (×2): 2 via ORAL
  Administered 2012-06-09 – 2012-06-11 (×4): 1 via ORAL
  Administered 2012-06-13: 2 via ORAL
  Administered 2012-06-13: 1 via ORAL
  Filled 2012-06-06: qty 1
  Filled 2012-06-06: qty 2
  Filled 2012-06-06 (×4): qty 1
  Filled 2012-06-06: qty 2
  Filled 2012-06-06 (×2): qty 1

## 2012-06-06 MED ORDER — CYANOCOBALAMIN 1000 MCG/ML IJ SOLN
1000.0000 ug | Freq: Every day | INTRAMUSCULAR | Status: AC
  Administered 2012-06-06 – 2012-06-09 (×4): 1000 ug via INTRAMUSCULAR
  Filled 2012-06-06 (×5): qty 1

## 2012-06-06 MED ORDER — FENTANYL CITRATE 0.05 MG/ML IJ SOLN
INTRAMUSCULAR | Status: AC | PRN
  Administered 2012-06-06: 100 ug via INTRAVENOUS
  Administered 2012-06-06: 50 ug via INTRAVENOUS

## 2012-06-06 MED ORDER — MIDAZOLAM HCL 2 MG/2ML IJ SOLN
INTRAMUSCULAR | Status: AC
  Filled 2012-06-06: qty 6

## 2012-06-06 MED ORDER — CHLORHEXIDINE GLUCONATE 0.12 % MT SOLN
15.0000 mL | Freq: Two times a day (BID) | OROMUCOSAL | Status: DC
  Administered 2012-06-06 – 2012-06-14 (×16): 15 mL via OROMUCOSAL
  Filled 2012-06-06 (×20): qty 15

## 2012-06-06 NOTE — Procedures (Signed)
CT R iliac bone marrow bx No complication No blood loss. See complete dictation in [facility].  

## 2012-06-06 NOTE — Plan of Care (Signed)
Problem: Phase II Progression Outcomes Goal: Progress activity as tolerated unless otherwise ordered Outcome: Progressing O2 sats dropped to 80% on RA during PT session/ambulation.

## 2012-06-06 NOTE — Progress Notes (Signed)
[name] had an uneventful night. She got her 2 units of blood. She might be going for her bone marrow test today.  She slept well. She's not complain of any pain.  Her chest x-ray turned out okay. There is no pneumonia.  This morning his blood work still is not done so will have this later on. Her admission lab work did not look that bad. Her renal function looked a little bit lower. This may benefit from some element of the hydration. She's not hypercalcemic. Her liver tests looked okay. Her potassium was fine.   Her vital signs are stable. She's afebrile. Blood pressure 121/64. Lungs are clear. Cardiac exam regular rate and rhythm with a normal S1 and S2. There are no murmurs rubs or bruits. Abdominal exam soft. She has good bowel sounds. There is no palpable hepatospleno megaly extremities shows the chronic trace edema. Neurological exam shows no focal neurological deficits.  I think the next "key" is the bone marrow test. This will really give [place] an idea as to how things are going with respect to her myeloma or if there is any evidence of myelodysplasia.  We will continue to try to adjust her medications so that she is on a more manageable regimen at home.  Pete e.

## 2012-06-06 NOTE — H&P (Signed)
[name] is an 66 y.o. female.   Chief Complaint: pt with hx of myeloma Has had drop in hemoglobin; weakness Scheduled now for bone marrow biopsy HPI: hypothyroid; GERD; COPD;CRI  Past Medical History  Diagnosis Date  . Myeloma   . Anxiety   . Hypothyroidism   . GERD (gastroesophageal reflux disease)   . Myeloma   . OAB (overactive bladder)   . COPD (chronic obstructive pulmonary disease)   . Osteoporosis   . Depression   . Chronic pain   . Renal insufficiency     Past Surgical History  Procedure Date  . Leg surgery   . Cholecystectomy   . Tonsillectomy   . Breast surgery   . Limbal stem cell transplant   . Abdominal hysterectomy     History reviewed. No pertinent family history. Social History:  reports that she has never smoked. She has never used smokeless tobacco. She reports that she does not drink alcohol or use illicit drugs.  Allergies:  Allergies  Allergen Reactions  . Trazodone And Nefazodone Other (See Comments)    nightmares  . Hydromorphone Itching  . Morphine And Related Rash  . Penicillins Rash    Medications Prior to Admission  Medication Sig Dispense Refill  . acyclovir (ZOVIRAX) 400 MG tablet Take 800 mg by mouth 2 (two) times daily.      Marland Kitchen ALPRAZolam (XANAX) 0.25 MG tablet Take 0.25 mg by mouth at bedtime as needed. Sleep.      Marland Kitchen Alum & Mag Hydroxide-Simeth (MAGIC MOUTHWASH) SOLN Take 5 mLs by mouth 4 (four) times daily as needed. [facility] formula--no Simethicone.      Marland Kitchen aspirin EC 81 MG tablet Take 81 mg by mouth at bedtime.      . benzonatate (TESSALON) 100 MG capsule Take 100 mg by mouth daily as needed. Cough.      . celecoxib (CELEBREX) 100 MG capsule Take 100 mg by mouth 2 (two) times daily.      . DULoxetine (CYMBALTA) 30 MG capsule Take 30 mg by mouth daily.      Marland Kitchen esomeprazole (NEXIUM) 40 MG capsule Take 40 mg by mouth daily before breakfast.      . Fluticasone-Salmeterol (ADVAIR) 250-50 MCG/DOSE AEPB Inhale 1 puff into the lungs  every 12 (twelve) hours.      Marland Kitchen HYDROcodone-acetaminophen (NORCO/VICODIN) 5-325 MG per tablet Take 1-2 tablets by mouth every 8 (eight) hours as needed. Pain.      Marland Kitchen HYDROcodone-homatropine (HYCODAN) 5-1.5 MG/5ML syrup Take 5 mLs by mouth every 6 (six) hours as needed. Cough.      . hydrOXYzine (ATARAX/VISTARIL) 25 MG tablet Take 25 mg by mouth 2 (two) times daily as needed. Allergy.      Marland Kitchen levothyroxine (SYNTHROID, LEVOTHROID) 50 MCG tablet Take 50 mcg by mouth at bedtime.       Marland Kitchen LORazepam (ATIVAN) 1 MG tablet Take 1 mg by mouth every 8 (eight) hours as needed. Nausea       . olopatadine (PATANOL) 0.1 % ophthalmic solution Place 1 drop into both eyes 2 (two) times daily.       . ondansetron (ZOFRAN) 8 MG tablet every 12 (twelve) hours as needed.       . pirbuterol (MAXAIR) 200 MCG/INH inhaler Inhale 1-2 puffs into the lungs every 12 (twelve) hours as needed. Wheezing/shortness of breath.  Use sparingly.      . pomalidomide (POMALYST) 3 MG capsule Take 3 mg by mouth daily.      Marland Kitchen  sulfamethoxazole-trimethoprim (BACTRIM DS) 800-160 MG per tablet Take 1 tablet by mouth 3 (three) times a week. On Monday, Wednesday and Friday.      . temazepam (RESTORIL) 30 MG capsule Take 30 mg by mouth at bedtime as needed. Sleep.      Marland Kitchen tolterodine (DETROL LA) 4 MG 24 hr capsule Take 4 mg by mouth daily.        . Vitamin D, Ergocalciferol, (DRISDOL) 50000 UNITS CAPS Take 50,000 Units by mouth every 7 (seven) days. Take on Monday      . DISCONTD: dexamethasone (DECADRON) 4 MG tablet 5 tabs (20 mg) weekly while on Revlimid  20 tablet  5  . DISCONTD: diazepam (VALIUM) 5 MG tablet Take 1 tablet (5 mg total) by mouth every 6 (six) hours as needed. Muscle spasm  30 tablet  1  . DISCONTD: diphenoxylate-atropine (LOMOTIL) 2.5-0.025 MG per tablet every morning. AS NEEDED      . DISCONTD: sulfamethoxazole-trimethoprim (BACTRIM DS) 800-160 MG per tablet       . DISCONTD: temazepam (RESTORIL) 15 MG capsule Take 1 capsule (15  mg total) by mouth at bedtime as needed for sleep.  30 capsule  0  . DISCONTD: torsemide (DEMADEX) 20 MG tablet Take 20 mg by mouth daily as needed. Fluid       . DISCONTD: traMADol (ULTRAM) 50 MG tablet TAKE 1 TABLET EVERY 6 HOURS AS NEEDED FOR PAIN  60 tablet  3  . DISCONTD: traZODone (DESYREL) 50 MG tablet every morning.        Results for orders placed during the hospital encounter of 06/05/12 (from the past 48 hour(s))  PREPARE RBC (CROSSMATCH)     Status: Normal   Collection Time   06/05/12  5:30 PM      Component Value Range Comment   Order Confirmation ORDER PROCESSED BY BLOOD BANK     TYPE AND SCREEN     Status: Normal (Preliminary result)   Collection Time   06/05/12  6:15 PM      Component Value Range Comment   ABO/RH(D) A NEG      Antibody Screen NEG      Sample Expiration 06/08/2012      Unit Number [id number]      Blood Component Type RBC LR PHER1      Unit division 00      Status of Unit ISSUED      Transfusion Status OK TO TRANSFUSE      Crossmatch Result Compatible      Unit Number [id number]      Blood Component Type RBC LR PHER1      Unit division 00      Status of Unit ISSUED      Transfusion Status OK TO TRANSFUSE      Crossmatch Result Compatible     CBC     Status: Abnormal   Collection Time   06/05/12  6:15 PM      Component Value Range Comment   WBC 7.4  4.0 - 10.5 K/uL    RBC 1.94 (*) 3.87 - 5.11 MIL/uL    Hemoglobin 6.9 (*) 12.0 - 15.0 g/dL    HCT 13.0 (*) 86.5 - 46.0 %    MCV 110.3 (*) 78.0 - 100.0 fL    MCH 35.6 (*) 26.0 - 34.0 pg    MCHC 32.2  30.0 - 36.0 g/dL    RDW 78.4 (*) 69.6 - 15.5 %    Platelets 177  150 - 400  K/uL   CREATININE, SERUM     Status: Abnormal   Collection Time   06/05/12  6:15 PM      Component Value Range Comment   Creatinine, Ser 1.31 (*) 0.50 - 1.10 mg/dL    GFR calc non Af Amer 42 (*) >90 mL/min    GFR calc Af Amer 48 (*) >90 mL/min   COMPREHENSIVE METABOLIC PANEL     Status: Abnormal   Collection Time   06/06/12   5:00 AM      Component Value Range Comment   Sodium 134 (*) 135 - 145 mEq/L    Potassium 3.7  3.5 - 5.1 mEq/L    Chloride 102  96 - 112 mEq/L    CO2 25  19 - 32 mEq/L    Glucose, Bld 130 (*) 70 - 99 mg/dL    BUN 29 (*) 6 - 23 mg/dL    Creatinine, Ser 7.82 (*) 0.50 - 1.10 mg/dL    Calcium 9.1  8.4 - 95.6 mg/dL    Total Protein 5.5 (*) 6.0 - 8.3 g/dL    Albumin 2.7 (*) 3.5 - 5.2 g/dL    AST 18  0 - 37 U/L    ALT 16  0 - 35 U/L    Alkaline Phosphatase 45  39 - 117 U/L    Total Bilirubin 0.7  0.3 - 1.2 mg/dL    GFR calc non Af Amer 41 (*) >90 mL/min    GFR calc Af Amer 48 (*) >90 mL/min   CBC WITH DIFFERENTIAL     Status: Abnormal   Collection Time   06/06/12  5:00 AM      Component Value Range Comment   WBC 6.8  4.0 - 10.5 K/uL    RBC 2.67 (*) 3.87 - 5.11 MIL/uL    Hemoglobin 8.8 (*) 12.0 - 15.0 g/dL    HCT 21.3 (*) 08.6 - 46.0 %    MCV 98.9  78.0 - 100.0 fL    MCH 33.0  26.0 - 34.0 pg    MCHC 33.3  30.0 - 36.0 g/dL    RDW 57.8 (*) 46.9 - 15.5 %    Platelets 137 (*) 150 - 400 K/uL    Neutrophils Relative 89 (*) 43 - 77 %    Lymphocytes Relative 6 (*) 12 - 46 %    Monocytes Relative 3  3 - 12 %    Eosinophils Relative 2  0 - 5 %    Basophils Relative 0  0 - 1 %    Neutro Abs 6.1  1.7 - 7.7 K/uL    Lymphs Abs 0.4 (*) 0.7 - 4.0 K/uL    Monocytes Absolute 0.2  0.1 - 1.0 K/uL    Eosinophils Absolute 0.1  0.0 - 0.7 K/uL    Basophils Absolute 0.0  0.0 - 0.1 K/uL    RBC Morphology OVAL MACROCYTES      WBC Morphology WHITE COUNT CONFIRMED ON SMEAR   MILD LEFT SHIFT (1-5% METAS, OCC MYELO, OCC BANDS)  LACTATE DEHYDROGENASE     Status: Normal   Collection Time   06/06/12  5:00 AM      Component Value Range Comment   LDH 154  94 - 250 U/L    Dg Chest Port 1 View  06/05/2012  *RADIOLOGY REPORT*  Clinical Data: Fever, cough, history of multiple myeloma  PORTABLE CHEST - 1 VIEW  Comparison: Chest x-ray of 04/05/2012  Findings: No active infiltrate or effusion is seen.  Mediastinal  contours are stable.  The heart is within upper limits of normal and stable.  A right-sided Port-A-Cath is present with the tip overlying the lower SVC near the expected right atrial junction. Multiple rib deformities are present most likely due to prior fracture and healing as well as possible myelomatous involvement.  IMPRESSION: No active lung disease.  Multiple bilateral rib fractures with healing.   Original Report Authenticated By: Juline Patch, M.D.     Review of Systems  Constitutional: Negative for fever.  Respiratory: Negative for shortness of breath.   Cardiovascular: Negative for chest pain.  Gastrointestinal: Negative for nausea and vomiting.  Neurological: Positive for weakness.    Blood pressure 121/64, pulse 78, temperature 98.3 F (36.8 C), temperature source Oral, resp. rate 18, height 4\' 11"  (1.499 m), weight 140 lb 3.4 oz (63.6 kg), SpO2 97.00%. Physical Exam  Constitutional: She is oriented to person, place, and time. She appears well-developed and well-nourished.  Cardiovascular: Normal rate, regular rhythm and normal heart sounds.   No murmur heard. Respiratory: Effort normal. She has wheezes.  GI: Soft. Bowel sounds are normal. There is tenderness.  Musculoskeletal: Normal range of motion.       Uses walker  Neurological: She is alert and oriented to person, place, and time.  Skin: Skin is warm and dry.  Psychiatric: She has a normal mood and affect. Her behavior is normal. Judgment and thought content normal.     Assessment/Plan Hx myeloma Recent drop in hgb; weakness Scheduled for BM bx in IR Pt aware of procedure benefits and risks and agreeable to proceed. Consent signed and in chart  Alexandrya Chim A 06/06/2012, 7:58 AM

## 2012-06-06 NOTE — Evaluation (Signed)
Physical Therapy Evaluation Patient Details Name: [name] MRN: [id number] DOB: 09-16-1946 Today's Date: 06/06/2012 Time: 4098-1191 PT Time Calculation (min): 24 min  PT Assessment / Plan / Recommendation Clinical Impression  66 yo female admitted with decreased Hgb and weakness. Pt demonstrates general weakness, decreased activity tolerance and limited LE ROM. O2 sats dropped to 80% on RA during/after ambulation. Discussed d/c plans with pt. Recommended pt have 24 hour supervision at discharge and HHPT. Pt  and friends present state that pt could possibly d/c to daughter's home. If 24 hour not available at home, then may need to consider ST rehab at [facility].     PT Assessment  Patient needs continued PT services    Follow Up Recommendations  Home health PT;Supervision/Assistance - 24 hour (If 24 hour not available, may need to consider SNF)    Barriers to Discharge        Equipment Recommendations  None recommended by PT    Recommendations for Other Services OT consult   Frequency Min 3X/week    Precautions / Restrictions Precautions Precautions: Fall Restrictions Weight Bearing Restrictions: No   Pertinent Vitals/Pain       Mobility  Bed Mobility Bed Mobility: Supine to Sit;Sit to Supine Supine to Sit: 4: Min assist;HOB elevated;With rails Sit to Supine: 4: Min assist Details for Bed Mobility Assistance: Assist for trunk to uprght/supine. Increased time. [place]iance on bedrail Transfers Transfers: Sit to Stand;Stand to Sit Sit to Stand: 4: Min assist;From bed;With upper extremity assist Stand to Sit: 4: Min assist;To bed;With upper extremity assist Details for Transfer Assistance: VCS safety, technique, hand placement. Assist to rise, stabilize, control descent.  Ambulation/Gait Ambulation/Gait Assistance: 1: +2 Total assist Ambulation/Gait: Patient Percentage: 80% Ambulation Distance (Feet): 25 Feet Assistive device: Rolling walker Ambulation/Gait Assistance  Details: VCs safety, posture, distance from RW. Fatigues eaisily. 2 standing rest breaks-pt propped elbows up on RW handles. O2 sats dropped to 80% on RA. +2 for safety due to weakness,poor tolerance. Gait Pattern: Step-through pattern;Decreased stride length;Decreased step length - right;Decreased step length - left;Trunk flexed    Exercises General Exercises - Lower Extremity Ankle Circles/Pumps: AROM;Both;10 reps;Supine Quad Sets: AROM;Both;10 reps;Supine Heel Slides: AAROM;Both;10 reps;Supine (with overpressure at end range of hip flexion for stretch) Hip ABduction/ADduction: AAROM;Both;10 reps;Supine (with overpressure at end range of hip abd for stretch) Other Exercises Other Exercises: Supine with hip/knee flexed ~ 60-70 degrees (pt unable to achieve 90-90) with passive IR/ER with overpressure at end ranges for stretch   PT Diagnosis: Difficulty walking;Generalized weakness  PT Problem List: Decreased strength;Decreased activity tolerance;Decreased range of motion;Decreased mobility;Decreased knowledge of use of DME;Cardiopulmonary status limiting activity PT Treatment Interventions: DME instruction;Gait training;Functional mobility training;Therapeutic activities;Therapeutic exercise;Patient/family education   PT Goals Acute Rehab PT Goals PT Goal Formulation: With patient Potential to Achieve Goals: Fair Pt will go Supine/Side to Sit: with supervision PT Goal: Supine/Side to Sit - Progress: Goal set today Pt will go Sit to Supine/Side: with supervision PT Goal: Sit to Supine/Side - Progress: Goal set today Pt will go Sit to Stand: with supervision PT Goal: Sit to Stand - Progress: Goal set today Pt will Ambulate: 51 - 150 feet;with supervision;with least restrictive assistive device (with O2 sats > 90%) PT Goal: Ambulate - Progress: Goal set today  Visit Information  Last PT Received On: 06/06/12 Assistance Needed: +2 (safety)    Subjective Data  Subjective: "I haven't been  up any" Patient Stated Goal: Home   Prior Functioning  Home Living Lives With: Alone  Type of Home: House Home Access: Level entry Home Layout: One level Home Adaptive Equipment: Walker - rolling;Tub transfer bench;Walker - four wheeled Additional Comments: Pt may need to d/c to daughter's home - bedroom on 1st level, 3-4 steps Prior Function Level of Independence: Independent with assistive device(s) Communication Communication: No difficulties    Cognition  Overall Cognitive Status: Appears within functional limits for tasks assessed/performed Arousal/Alertness: Awake/Rios Orientation Level: Appears intact for tasks assessed Behavior During Session: [facility] for tasks performed    Extremity/Trunk Assessment Right Lower Extremity Assessment RLE ROM/Strength/Tone: Deficits RLE ROM/Strength/Tone Deficits: Limited hip ROM (flex, abd/add, IR/ER). Strength 3+5 throughout Left Lower Extremity Assessment LLE ROM/Strength/Tone: Deficits LLE ROM/Strength/Tone Deficits: Limited hip ROM (flex, abd/add, IR/ER). Strength 3+5 throughout   Balance    End of Session PT - End of Session Equipment Utilized During Treatment: Gait belt Activity Tolerance: Patient limited by fatigue Patient left: in chair;with call bell/phone within reach;with family/visitor present  GP     [name] [facility] 06/06/2012, 3:42 PM [phone]

## 2012-06-06 NOTE — Progress Notes (Signed)
ANTICOAGULATION CONSULT NOTE: Lovenox Dose Adjustment  Pharmacy Consult for Lovenox Indication: VTE prophylaxis  Allergies  Allergen Reactions  . Trazodone And Nefazodone Other (See Comments)    nightmares  . Hydromorphone Itching  . Morphine And Related Rash  . Penicillins Rash    Patient Measurements: Height: 4\' 11"  (149.9 cm) Weight: 140 lb 3.4 oz (63.6 kg) IBW/kg (Calculated) : 43.2    Vital Signs: Temp: 99.3 F (37.4 C) (08/20 0430) Temp src: Oral (08/20 0430) BP: 95/52 mmHg (08/20 0430) Pulse Rate: 85  (08/20 0430)  Labs:  Basename 06/06/12 0825 06/06/12 0500 06/05/12 1815 06/05/12 1338  HGB -- 8.8* 6.9* --  HCT -- 26.4* 21.4* 21.9*  PLT -- 137* 177 164  APTT 42* -- -- --  LABPROT 14.0 -- -- --  INR 1.06 -- -- --  HEPARINUNFRC -- -- -- --  CREATININE -- 1.32* 1.31* 1.34*  CKTOTAL -- -- -- --  CKMB -- -- -- --  TROPONINI -- -- -- --    Estimated Creatinine Clearance: 34.5 ml/min (by C-G formula based on Cr of 1.32).   Medical History: Past Medical History  Diagnosis Date  . Myeloma   . Anxiety   . Hypothyroidism   . GERD (gastroesophageal reflux disease)   . Myeloma   . OAB (overactive bladder)   . COPD (chronic obstructive pulmonary disease)   . Osteoporosis   . Depression   . Chronic pain   . Renal insufficiency     Medications:  Scheduled:    . acyclovir  400 mg Oral BID  . albuterol  1-2 puff Inhalation QID  . aspirin EC  81 mg Oral QHS  . celecoxib  100 mg Oral BID  . chlorhexidine  15 mL Mouth Rinse BID  . DULoxetine  30 mg Oral Daily  . enoxaparin (LOVENOX) injection  40 mg Subcutaneous Q24H  . fesoterodine  4 mg Oral Daily  . Fluticasone-Salmeterol  1 puff Inhalation Q12H  . levothyroxine  50 mcg Oral QAC breakfast  . olopatadine  1 drop Both Eyes BID  . pantoprazole  80 mg Oral Daily  . polyethylene glycol  17 g Oral Daily  . Vitamin D (Ergocalciferol)  50,000 Units Oral Q7 days  . DISCONTD: albuterol  1 puff Inhalation Q6H    . DISCONTD: enoxaparin (LOVENOX) injection  30 mg Subcutaneous Q24H  . DISCONTD: pirbuterol  2 puff Inhalation QID  . DISCONTD: polyethylene glycol powder  1 Container Oral q morning - 10a    Assessment: 66 YOF on Lovenox 30mg  sq q24h for VTE ppx, pharmacy may adjust dose. Scr 1.3, CrCl >30. Wt >45kg. No bleeding reported. CBC reviewed, low H/H. FOB in office negative per MD note, MD thinks pt is hemolyzing.   Goal of Therapy:  Appropriate Lovenox dose for VTE ppx Monitor platelets by anticoagulation protocol: Yes   Plan:   Increase Lovenox to 40mg  sq q24h  Will monitor  CBC/Scr  Will monitor for s/sx bleeding  Adjust dose as necessary  Noted that pt is scheduled for biopsy. Per RN, unknown what time biopsy will be done. Lovenox dose due @ 1800, will defer adjustments in timing d/t biopsy to MD  Gwen Her PharmD  [phone] 06/06/2012 9:27 AM

## 2012-06-06 NOTE — H&P (Signed)
NAMELATICE, Stacie NO.:  Rios  MEDICAL RECORD NO.:  [id number]  LOCATION:  1313                         FACILITY:  [facility]  PHYSICIAN:  [name], M.D.  DATE OF BIRTH:  06-Jun-1946  DATE OF ADMISSION:  06/05/2012 DATE OF DISCHARGE:                             HISTORY & PHYSICAL   REASON FOR ADMISSION: 1. Profound anemia. 2. Kappa light chain myeloma-relapsed. 3. Significant fatigue.  HISTORY OF PRESENT ILLNESS:  [name] is a 66 year old white female with kappa light chain myeloma.  She is on pomalidomide and Decadron.  She actually has been doing fairly well with this.  I began to note that her hemoglobin was dropping.  We checked her CBC I can think a couple of weeks ago.  Hemoglobin 7.2.  Today, she comes in.  She is very weak.  She had a cough.  She had a low-grade temperature.  Her hemoglobin was down at 6.8.  White count of 7.4.  I felt that we had to admit her.  She needed to be transfused with blood because of her symptoms.  I also felt that she is going to need a bone marrow biopsy to assess for myeloma progression with a possibility of transformation to a myelodysplastic process.  She complains pain on the right side of her chest.  This happened after she picked up her grandchild.  We will get a chest x-ray to make sure there is no pneumonia.  We also can check for rib fracture.  She is on quite a bit of medications.  We really need to try to figure out how to improve her medication list.  This can happen when she is in the hospital.  She has had no obvious known melena or bright red blood per rectum.  She has had a chronic cough.  We found on different medications for this without much success.  She has started the next cycle of pomalidomide last Tuesday.  She has been getting IVIG in the office.  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  She last got IVIG back on August 5.  She has  hypothyroidism.  She is on Synthroid.  The patient had an episode of emesis yesterday.  There has been no diarrhea.  She has had no rashes.  There has been some occasional leg swelling.  She has incredibly stiff legs because of her reaction to radiation that she had many years ago.  She has had a surgery for this.  She has rods in her thighs.  PAST MEDICAL HISTORY:  Past medical history is remarkable for: 1. Hypothyroidism. 2. Anxiety. 3. GERD. 4. Recurrent kappa myeloma.  ALLERGIES: 1. DILAUDID. 2. TRAZODONE. 3. MORPHINE. 4. PENICILLIN.  MEDICATIONS: 1. Acyclovir 400 mg p.o. b.i.d. 2. Xanax 0.25 mg t.i.d. p.r.n. 3. Aspirin 81 mg p.o. daily. 4. Tessalon Perles 100 mg p.o. daily p.r.n. 5. Celebrex 100 mg p.o. b.i.d. 6. Cymbalta 30 mg p.o. at bedtime. 7. Advair Diskus (250/50) 1 puff q.12 h. p.r.n. 8. Norco (5/325) 1-2 p.o. q.8 h. p.r.n. 9. Hycodan elixir 5 mL p.o. q.6 h. p.r.n. cough. 10.Synthroid 0.05 mg p.o. at bedtime. 11.Maxair 200 mcg 1-2  puffs q.12 h. p.r.n. 12.Ativan 1 mg p.o. q.8 h. p.r.n. 13.Nexium 40 mg p.o. daily. 14.Patanol eyedrops 0.1% 1 drop both eyes b.i.d. 15.Detrol LA 4 mg p.o. daily. 16.Lomotil 1-2 p.o. q.6 h. p.r.n. 17.Demadex 20 mg p.o. daily. 18.Restoril 15 mg p.o. at bedtime p.r.n. 19.Ultram 50 mg p.o. q.6 h. p.r.n. pain. 20.Decadron 20 mg p.o. every week.  SOCIAL HISTORY:  Negative for tobacco or alcohol use.  FAMILY HISTORY:  Noncontributory.  PHYSICAL EXAMINATION:  GENERAL:  This is a somewhat chronically ill- appearing white female, in no obvious distress. VITAL SIGNS:  Temperature 99.9, pulse 94, respiratory rate 18, blood pressure 112/72, oxygen saturation 94%. HEAD AND NECK:  Shows a normocephalic, atraumatic skull.  There are no ocular or oral lesions.  There are no palpable cervical or supraclavicular lymph nodes. LUNGS:  Clear bilaterally.  There are no rales, wheezes, or rhonchi. CARDIAC:  Regular rate and rhythm with normal S1,  S2.  There are no murmurs, rubs, or bruits. ABDOMEN:  Soft with good bowel sounds.  There is no fluid wave.  There is no palpable hepatosplenomegaly. BACK:  Shows no tenderness over the spine, ribs, or hips. EXTREMITIES:  Shows decreased range of motion of her legs, which is chronic.  She has some trace edema in her lower legs. SKIN:  No rashes. NEUROLOGICAL:  Shows no focal neurological deficits.  LABORATORY DATA:  Laboratory studies show white cell count 7.4, hemoglobin 6.8, hematocrit 21.9, platelet count 164.  MCV is 115.  Peripheral smear shows some anisocytosis and poikilocytosis.  I do not see any obvious nucleated red cells.  There is some microcytic red cells.  There were no target cells.  She had some rouleaux formation. There was no spherocytes.  I saw no schistocyte.  White cells appear normal in morphological maturation.  I do not see any immature myeloid cells.  There may be an increase in lymphs and monocytes.  There are no hypersegmented polys.  I did not see any obvious blasts.  Platelets were adequate in number and size.  IMPRESSIONS:  [name] is a 66 year old female with relapse light chain myeloma.  She has actually done incredibly well.  I am not sure as to why she dropped her blood counts.  I did do a rectal exam in the office.  The rectal exam was negative for heme.  We got a good specimen.  I did send off a reticulocyte count.  We will see what that shows.  I cannot imagine her being iron deficient with MCV of 115.  We can think that she is hemolyzing.  One has to worry about the possibility of myelodysplastic issues.  She is now 65 years out from transplant, so she would be at risk for myelodysplasia.  She needs a bone marrow biopsy.  We will get that set up by Radiology in 1 or 2 days.  I will transfuse her 2 units.  I will also need to make sure we adjust her medications.  Her home list is ridiculous.  We really need to narrow this list  down.  I did do a chest x-ray on her in the hospital.  The chest x-ray did not show any obvious disease such as pneumonia.  It is hard to say how long she will be in the hospital.  We will just have to await the results of our studies.  We will support her as much as we need to.  For now, she is a full code.  We  may have to readdress this issue depending on what lab results we get back.     [name], M.D.     PRE/MEDQ  D:  06/05/2012  T:  06/06/2012  Job:  [id number]

## 2012-06-07 ENCOUNTER — Encounter: Payer: Medicare Other | Admitting: Physical Therapy

## 2012-06-07 DIAGNOSIS — E538 Deficiency of other specified B group vitamins: Secondary | ICD-10-CM

## 2012-06-07 DIAGNOSIS — R05 Cough: Secondary | ICD-10-CM

## 2012-06-07 DIAGNOSIS — E876 Hypokalemia: Secondary | ICD-10-CM

## 2012-06-07 LAB — BASIC METABOLIC PANEL
BUN: 21 mg/dL (ref 6–23)
Calcium: 9.1 mg/dL (ref 8.4–10.5)
Chloride: 105 mEq/L (ref 96–112)
Creatinine, Ser: 1.34 mg/dL — ABNORMAL HIGH (ref 0.50–1.10)
GFR calc Af Amer: 47 mL/min — ABNORMAL LOW (ref >90)
GFR calc non Af Amer: 41 mL/min — ABNORMAL LOW (ref >90)

## 2012-06-07 LAB — TYPE AND SCREEN
ABO/RH(D): A NEG
Antibody Screen: NEGATIVE
Unit division: 0

## 2012-06-07 LAB — CBC
HCT: 25 % — ABNORMAL LOW (ref 36.0–46.0)
MCH: 32.8 pg (ref 26.0–34.0)
MCHC: 32.8 g/dL (ref 30.0–36.0)
MCV: 100 fL (ref 78.0–100.0)
Platelets: 128 10*3/uL — ABNORMAL LOW (ref 150–400)
RDW: 23.4 % — ABNORMAL HIGH (ref 11.5–15.5)

## 2012-06-07 MED ORDER — POTASSIUM CHLORIDE 10 MEQ/50ML IV SOLN
10.0000 meq | INTRAVENOUS | Status: AC
  Administered 2012-06-07 (×3): 10 meq via INTRAVENOUS
  Filled 2012-06-07 (×3): qty 50

## 2012-06-07 NOTE — Progress Notes (Addendum)
Patients sats 87-90% on room air while lying in bed (just awoke from sleep). Reapplied Oxygen at 2l [place] with sats increasing to above 90 %. Patient reports that she wakes up at home during the night a lot.

## 2012-06-07 NOTE — Progress Notes (Signed)
Physical Therapy Treatment Patient Details Name: [name] MRN: [id number] DOB: 23-Nov-1945 Today's Date: 06/07/2012 Time: 1340-1400 PT Time Calculation (min): 20 min  PT Assessment / Plan / Recommendation Comments on Treatment Session  improvement in gait with RW and with exercise for core activation and LE strengthening. Pt motivated to get stronger and return to home    Follow Up Recommendations       Barriers to Discharge        Equipment Recommendations       Recommendations for Other Services    Frequency     Plan Equipment recommendations need to be updated    Precautions / Restrictions Precautions Precautions: Fall Restrictions Weight Bearing Restrictions: No   Pertinent Vitals/Pain Pt c/o pain in hands and shoulders.     Mobility  Bed Mobility Bed Mobility: Supine to Sit;Sit to Supine Supine to Sit: 4: Min assist;HOB elevated;With rails Sit to Supine: 4: Min assist Details for Bed Mobility Assistance: Assist for trunk to uprght/supine. Increased time. [place] on bedrail Transfers Transfers: Sit to Stand;Stand to Sit Sit to Stand: 4: Min assist;From bed;With upper extremity assist;Other (comment) (pt does not push up with arms, pulls up on walker) Stand to Sit: To bed;With upper extremity assist;4: Min guard Details for Transfer Assistance: VCS safety, technique, hand placement. Assist to rise, stabilize, control descent.  Ambulation/Gait Ambulation/Gait Assistance: 3: Mod assist Ambulation Distance (Feet): 50 Feet Assistive device: Rolling walker Ambulation/Gait Assistance Details: pt with steady cues to keep trunk elongated in extension Gait Pattern: Step-through pattern;Decreased stride length;Decreased step length - right;Decreased step length - left;Trunk flexed Gait velocity: decreased General Gait Details: pt self limits distance that she walks . Reports she is limited by leg weakness and dyspnea Stairs: No Wheelchair Mobility Wheelchair  Mobility: No    Exercises General Exercises - Lower Extremity Ankle Circles/Pumps: AROM;Both;10 reps;Supine Quad Sets: AROM;Both;10 reps;Supine Gluteal Sets: AROM;Both;10 reps;Supine Short Arc Quad: AROM;Both;10 reps;Supine Heel Slides: AAROM;Both;Supine;5 reps Hip ABduction/ADduction: AROM;Both;5 reps;Supine Straight Leg Raises: AROM;Both;5 reps;Supine Hip Flexion/Marching: AROM;Both;5 reps;Seated Other Exercises Other Exercises: pt issued hard copy of exercises for core and LE - copy place in shadow chart   PT Diagnosis:    PT Problem List:   PT Treatment Interventions:     PT Goals Acute Rehab PT Goals PT Goal Formulation: With patient Potential to Achieve Goals: Fair Pt will go Supine/Side to Sit: with supervision PT Goal: Supine/Side to Sit - Progress: Progressing toward goal Pt will go Sit to Supine/Side: with supervision PT Goal: Sit to Supine/Side - Progress: Progressing toward goal Pt will go Sit to Stand: with supervision PT Goal: Sit to Stand - Progress: Progressing toward goal Pt will Ambulate: 51 - 150 feet;with supervision;with least restrictive assistive device (with O2 sats > 90%) PT Goal: Ambulate - Progress: Progressing toward goal  Visit Information  Last PT Received On: 06/07/12 Assistance Needed: +1    Subjective Data  Subjective: I need to get stronger Patient Stated Goal: home   Cognition  Overall Cognitive Status: Appears within functional limits for tasks assessed/performed Arousal/Alertness: Awake/alert Orientation Level: Appears intact for tasks assessed Behavior During Session: [facility] for tasks performed    Balance     End of Session PT - End of Session Equipment Utilized During Treatment: Gait belt Activity Tolerance: Patient limited by fatigue Patient left: in bed;with call bell/phone within reach;Other (comment) (siting on EOB)   GP     [name] K. [name], [name] 06/07/2012, 3:25 PM

## 2012-06-07 NOTE — Progress Notes (Signed)
She had her bone marrow test yesterday. We will await the results in a couple days. We check her serum kappa light chain 2 days ago. This was slightly up at 2.3 mg/dL.  She's had no obvious bleeding. There's been no diarrhea. She's had a cough and this is more chronic. It is nonproductive. She is on inhalers.  Physical therapy has seen her. I very much appreciate all her work with her.  She's had no rashes. There's been no nausea.  Her hemoglobin was 8.2. Her vitamin B12 level was borderline. I will give her some B12 while she is in the hospital.  Her BUN and creatinine are stable. Potassium a little bit low. I will give her some IV potassium.  Her hemoglobin is dropping just a little bit. I'm not sure as to why. I'll make sure she has her stools checked.  Her physical exam is unchanged. Her vital signs are stable. Lungs are clear with occasional wheezes. She has good air movement in her lungs. Cardiac exam regular rate and rhythm with no murmurs rubs or bruits. Abdominal exam soft. There are good bowel sounds. There is no fluid wave. Extremities shows some slight trace edema which is chronic.  Again, the key will be her bone marrow results.  We will continue to pray for her.  Pete E.  Hebrews 12:12

## 2012-06-07 NOTE — Clinical Documentation Improvement (Signed)
RENAL FAILURE DOCUMENTATION CLARIFICATION QUERY  THIS DOCUMENT IS NOT A PERMANENT PART OF THE MEDICAL RECORD  TO RESPOND TO THE THIS QUERY, FOLLOW THE INSTRUCTIONS BELOW:  1. If needed, update documentation for the patient's encounter via the notes activity.  2. Access this query again and click edit on the In [employer].  3. After updating, or not, click F2 to complete all highlighted (required) fields concerning your review. Select "additional documentation in the medical record" OR "no additional documentation provided".  4. Click Sign note button.  5. The deficiency will fall out of your In Basket *Please let us know if you are not able to complete this workflow by phone or e-mail (listed below).  Please update your documentation within the medical record to reflect your response to this query.                                                                                    06/07/12  Dear Dr. Myna Hidalgo, [name]  In a better effort to capture your patient's severity of illness, reflect appropriate length of stay and utilization of resources, a review of the patient medical record has revealed the following indicators.    Based on your clinical judgment, please clarify and document in a progress note and/or discharge summary the clinical condition associated with the following supporting information:  In responding to this query please exercise your independent judgment.  The fact that a query is asked, does not imply that any particular answer is desired or expected.  Pt admitted with profound anemia  According to [facility] pt has history of renal insufficiency.    Please clarify if the "renal insufficiency" can be further specified as one of the diagnoses listed below and document in pn or d/c summary.   Possible Clinical Conditions?  _______Acute Renal Failure _______Acute Kidney Injury _______Acute on Chronic Renal Failure  _______Other  Condition_____________ _______Cannot Clinically Determine     Supporting Information:  Risk Factors: relapse light chain myeloma,  possibility of myelodysplastic issues, Profound anemia, Renal insufficiency  Signs and Symptoms:  Diagnostics: Component     Latest Ref Rng 06/05/2012         1:38 PM  BUN     6 - 23 mg/dL 35 (H)  Creatinine     0.50 - 1.10 mg/dL 1.61 (H)    Treatments: Monitoring sodium chloride 0.9 % injection 10 mL                      You may use possible, probable, or suspect with inpatient documentation. possible, probable, suspected diagnoses MUST be documented at the time of discharge  Reviewed:  no additional documentation provided ljh  Thank You,  Enis Slipper  RN, BSN, MSN/Inf, CCDS Clinical Documentation Specialist [facility] Dept Pager: [phone] / E-mail: [email or web address]  Health Information Management [facility]

## 2012-06-08 LAB — BASIC METABOLIC PANEL
Calcium: 8.8 mg/dL (ref 8.4–10.5)
GFR calc Af Amer: 49 mL/min — ABNORMAL LOW (ref >90)
GFR calc non Af Amer: 42 mL/min — ABNORMAL LOW (ref >90)
Glucose, Bld: 107 mg/dL — ABNORMAL HIGH (ref 70–99)
Sodium: 138 mEq/L (ref 135–145)

## 2012-06-08 LAB — CBC
Hemoglobin: 8.1 g/dL — ABNORMAL LOW (ref 12.0–15.0)
MCH: 33.3 pg (ref 26.0–34.0)
MCHC: 32.7 g/dL (ref 30.0–36.0)
RDW: 22.5 % — ABNORMAL HIGH (ref 11.5–15.5)

## 2012-06-08 LAB — ERYTHROPOIETIN: Erythropoietin: 95.1 m[IU]/mL — ABNORMAL HIGH (ref 2.6–34.0)

## 2012-06-08 MED ORDER — CYCLOBENZAPRINE HCL 5 MG PO TABS
5.0000 mg | ORAL_TABLET | Freq: Once | ORAL | Status: AC
Start: 2012-06-08 — End: 2012-06-09
  Administered 2012-06-09: 5 mg via ORAL
  Filled 2012-06-08: qty 1

## 2012-06-08 MED ORDER — ALBUTEROL SULFATE HFA 108 (90 BASE) MCG/ACT IN AERS
1.0000 | INHALATION_SPRAY | Freq: Three times a day (TID) | RESPIRATORY_TRACT | Status: DC
  Administered 2012-06-08 – 2012-06-10 (×6): 2 via RESPIRATORY_TRACT
  Administered 2012-06-10: 1 via RESPIRATORY_TRACT
  Administered 2012-06-10 – 2012-06-14 (×9): 2 via RESPIRATORY_TRACT

## 2012-06-08 NOTE — Progress Notes (Signed)
[name] had a good day yesterday. She did do physical therapy. She is quite motivated.  We are still awaiting the bone marrow biopsy results. We might be something later on today.  Her hemoglobin is 8.1. Were seen if is any blood in the stool. When I checked in the office this was negative. Her renal function is okay. Her erythropoietin level is pending.  Her physical exam shows vital signs 97.9 pulse 77 respiratory 17 blood pressure 133/75. Lungs are clear bilaterally. I will occasional wheeze might be noted. Shows good movement bilaterally. Cardiac exam regular rate and rhythm with no murmurs rubs or bruits. Abdominal exam soft good bowel sounds. There is no fluid wave. She does have a ventral hernia. Extremities shows no changes with her legs.  We will await the results of her bone marrow biopsy. This will really dictate our next recommendation.  Is some is with her today. It was nice to see him again. It is a see him in the gym.  Stacie E.

## 2012-06-08 NOTE — Progress Notes (Signed)
PT Cancellation Note  Treatment cancelled today due to pt too sleepy.  Attempted 3x to see pt at various times today Will check back with patient tomorrow  Donnetta Hail 06/08/2012, 4:46 PM

## 2012-06-08 NOTE — Progress Notes (Signed)
[name] is from [facility]. She lived in [place] and then [place] until her spouse died. At that time she moved up to [place] with her daughter. She misses her connections, particularly with the church that she had in [facility]. She is also anxious about the bone marrow biopsy results. She requested prayer about her health and about finding a church that feels like home here.   06/08/12 1400  Clinical Encounter Type  Visited With Patient  Visit Type Initial;Spiritual support;Social support  Referral From Nurse  Recommendations Follow up  Spiritual Encounters  Spiritual Needs Emotional;Prayer;Literature;[facility] text  Stress Factors  Patient Stress Factors Loss

## 2012-06-09 DIAGNOSIS — N189 Chronic kidney disease, unspecified: Secondary | ICD-10-CM

## 2012-06-09 DIAGNOSIS — N039 Chronic nephritic syndrome with unspecified morphologic changes: Secondary | ICD-10-CM

## 2012-06-09 DIAGNOSIS — D631 Anemia in chronic kidney disease: Secondary | ICD-10-CM

## 2012-06-09 LAB — BASIC METABOLIC PANEL
Calcium: 8.6 mg/dL (ref 8.4–10.5)
Chloride: 105 mEq/L (ref 96–112)
Creatinine, Ser: 1.11 mg/dL — ABNORMAL HIGH (ref 0.50–1.10)
GFR calc Af Amer: 59 mL/min — ABNORMAL LOW (ref >90)
Sodium: 140 mEq/L (ref 135–145)

## 2012-06-09 LAB — CBC
MCH: 33.2 pg (ref 26.0–34.0)
MCV: 102.5 fL — ABNORMAL HIGH (ref 78.0–100.0)
Platelets: 137 10*3/uL — ABNORMAL LOW (ref 150–400)
RDW: 21.5 % — ABNORMAL HIGH (ref 11.5–15.5)
WBC: 3.5 10*3/uL — ABNORMAL LOW (ref 4.0–10.5)

## 2012-06-09 LAB — MAGNESIUM: Magnesium: 1.9 mg/dL (ref 1.5–2.5)

## 2012-06-09 MED ORDER — CYCLOBENZAPRINE HCL 5 MG PO TABS
5.0000 mg | ORAL_TABLET | Freq: Three times a day (TID) | ORAL | Status: DC | PRN
  Administered 2012-06-09 – 2012-06-13 (×3): 5 mg via ORAL
  Filled 2012-06-09 (×3): qty 1

## 2012-06-09 MED ORDER — DARBEPOETIN ALFA-POLYSORBATE 300 MCG/0.6ML IJ SOLN
300.0000 ug | Freq: Once | INTRAMUSCULAR | Status: AC
  Administered 2012-06-09: 300 ug via SUBCUTANEOUS
  Filled 2012-06-09: qty 0.6

## 2012-06-09 NOTE — Progress Notes (Signed)
ANTICOAGULATION CONSULT NOTE: Lovenox Dose Adjustment  Pharmacy Consult for Lovenox Indication: VTE prophylaxis  Allergies  Allergen Reactions  . Trazodone And Nefazodone Other (See Comments)    nightmares  . Hydromorphone Itching  . Morphine And Related Rash  . Penicillins Rash    Patient Measurements: Height: 4\' 11"  (149.9 cm) Weight: 140 lb 3.4 oz (63.6 kg) IBW/kg (Calculated) : 43.2    Vital Signs: Temp: 98.3 F (36.8 C) (08/23 0630) Temp src: Oral (08/23 0630) BP: 129/69 mmHg (08/23 0630) Pulse Rate: 68  (08/23 0630)  Labs:  Basename 06/09/12 0430 06/08/12 0440 06/07/12 0515 06/06/12 0825  HGB 8.1* 8.1* -- --  HCT 25.0* 24.8* 25.0* --  PLT 137* 124* 128* --  APTT -- -- -- 42*  LABPROT -- -- -- 14.0  INR -- -- -- 1.06  HEPARINUNFRC -- -- -- --  CREATININE 1.11* 1.29* 1.34* --  CKTOTAL -- -- -- --  CKMB -- -- -- --  TROPONINI -- -- -- --    Estimated Creatinine Clearance: 41 ml/min (by C-G formula based on Cr of 1.11).   Medical History: Past Medical History  Diagnosis Date  . Myeloma   . Anxiety   . Hypothyroidism   . GERD (gastroesophageal reflux disease)   . Myeloma   . OAB (overactive bladder)   . COPD (chronic obstructive pulmonary disease)   . Osteoporosis   . Depression   . Chronic pain   . Renal insufficiency     Medications:  Scheduled:     . acyclovir  400 mg Oral BID  . albuterol  1-2 puff Inhalation TID  . aspirin EC  81 mg Oral QHS  . celecoxib  100 mg Oral BID  . chlorhexidine  15 mL Mouth Rinse BID  . cyanocobalamin  1,000 mcg Intramuscular Daily  . cyclobenzaprine  5 mg Oral Once  . DULoxetine  30 mg Oral Daily  . enoxaparin (LOVENOX) injection  40 mg Subcutaneous Q24H  . fesoterodine  4 mg Oral Daily  . Fluticasone-Salmeterol  1 puff Inhalation Q12H  . levothyroxine  50 mcg Oral QAC breakfast  . olopatadine  1 drop Both Eyes BID  . pantoprazole  80 mg Oral Daily  . polyethylene glycol  17 g Oral Daily  . Vitamin D  (Ergocalciferol)  50,000 Units Oral Q7 days  . DISCONTD: albuterol  1-2 puff Inhalation QID    Assessment:  35 YOF admitted with weakness and anemia.   Pharmacy asked to assist with Lovenox for VTE ppx. Currently on 40mg  sq q24h.  CrCl remains >30. Wt >45kg.  S/p BMB 8/20.  Fecal occult blood positive but CBC stable and VSS. On Aranesp, Protonix. ECASA 81mg  and Celebrex.  Goal of Therapy:  Appropriate Lovenox dose for VTE ppx Monitor platelets by anticoagulation protocol: Yes   Plan:   Cont. Lovenox to 40mg  SQ q24h.  Cont daily f/u.  Charolotte Eke, PharmD, pager [phone]. 06/09/2012,8:27 AM.

## 2012-06-09 NOTE — Progress Notes (Signed)
Physical Therapy Treatment Patient Details Name: [name] MRN: [id number] DOB: 24-Mar-1946 Today's Date: 06/09/2012 Time: 4098-1191 PT Time Calculation (min): 19 min  PT Assessment / Plan / Recommendation Comments on Treatment Session  pt feels that her ambulation and mobility are nearly back to baseline.  She still feels like HHPT would be beneficial to continue strengthening    Follow Up Recommendations  Home health PT;Supervision - Intermittent    Barriers to Discharge        Equipment Recommendations  None recommended by PT    Recommendations for Other Services    Frequency Min 3X/week   Plan Discharge plan remains appropriate    Precautions / Restrictions     Pertinent Vitals/Pain Pt c/o intermittent back pain when walking with back extended. She prefers to walk bent over rollator leaning on forearms    Mobility  Bed Mobility Bed Mobility: Supine to Sit;Sit to Supine Supine to Sit: HOB elevated;With rails;6: Modified independent (Device/Increase time) Transfers Transfers: Sit to Stand;Stand to Sit Sit to Stand: From bed;With upper extremity assist;5: Supervision Stand to Sit: To bed;With upper extremity assist;4: Min guard Details for Transfer Assistance: VCS safety, technique, hand placement. Assist to rise, stabilize, control descent.  Ambulation/Gait Ambulation/Gait Assistance: 5: Supervision Ambulation Distance (Feet): 200 Feet Assistive device: 4-wheeled walker Ambulation/Gait Assistance Details: pt occasionally leans forward to put forearms on RW.  she used her rollator today, but brakes need to be tightened.  Informed her son who will take care of it Gait Pattern: Step-through pattern;Decreased stride length;Decreased step length - right;Decreased step length - left;Trunk flexed General Gait Details: pt bothered by back pain and would intermittently walk bent over on forearms and then more erect. She had back pain when walking erect Stairs: No Wheelchair  Mobility Wheelchair Mobility: No    Exercises     PT Diagnosis:    PT Problem List:   PT Treatment Interventions:     PT Goals Acute Rehab PT Goals PT Goal: Supine/Side to Sit - Progress: Met PT Goal: Sit to Stand - Progress: Progressing toward goal PT Goal: Ambulate - Progress: Progressing toward goal  Visit Information  Last PT Received On: 06/09/12 Assistance Needed: +1    Subjective Data  Subjective: "I'm almost back to where I was" Patient Stated Goal: none stated   Cognition  Overall Cognitive Status: Appears within functional limits for tasks assessed/performed Arousal/Alertness: Awake/alert Orientation Level: Appears intact for tasks assessed Behavior During Session: [facility] for tasks performed    Balance  Balance Balance Assessed: No  End of Session PT - End of Session Equipment Utilized During Treatment: Oxygen (O2 sats >95 with ambulation on 2L) Activity Tolerance: Patient limited by fatigue Patient left: in bed;with call bell/phone within reach;Other (comment) (siting on EOB) Nurse Communication: Mobility status   GP     Bayard Hugger. Manson Passey, Advance [phone] 06/09/2012, 2:26 PM

## 2012-06-09 NOTE — Progress Notes (Signed)
Pt admitted

## 2012-06-09 NOTE — Progress Notes (Signed)
I had a long talk with [name] this morning. Her bone marrow results did come back. I spoke to the pathologist. The bone marrow does look more involvement by myeloma.  The is cytogenetics are still pending. Her marrow was somewhat hypercellular.  I don't see that we have to continue her on the,pomalyst at this point. These need to try to get her anemia better. As far as I can tell, she's not bleeding. We've done a rectal exam on her which was negative.  I will try Aranesp to see if this cannot help. Her erythropoiten level has been on the low side. There's no evidence of iron deficiency. It is possible that her medications might be causing the anemia.  She's been afebrile. She is doing physical therapy. Her appetite is picking up.  I talked to her. I don't see that we have a lot of options left for her myeloma. She's been through standard treatments for myeloma. I told her we get her back to [facility] if she would like and have them reevaluate her.   Her vital signs are stable. Oxygen saturation is 95%.  Her lungs are clear bilaterally. Cardiac exam regular rate and rhythm with a normal S1-S2. Abdominal exam soft. She has good bowel sounds. Extremities shows the firmness in her thighs.  Hopefully, we will be able to go home soon. It systemic difficult trying to treat the myeloma again. 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 I thought maybe she might be developing myelodysplasia. It will be interesting to see what the chromosome studies are like.  Pete E.

## 2012-06-10 DIAGNOSIS — C9000 Multiple myeloma not having achieved remission: Principal | ICD-10-CM

## 2012-06-10 DIAGNOSIS — D649 Anemia, unspecified: Secondary | ICD-10-CM

## 2012-06-10 LAB — BASIC METABOLIC PANEL
Calcium: 8.9 mg/dL (ref 8.4–10.5)
GFR calc Af Amer: 54 mL/min — ABNORMAL LOW (ref >90)
GFR calc non Af Amer: 47 mL/min — ABNORMAL LOW (ref >90)
Sodium: 141 mEq/L (ref 135–145)

## 2012-06-10 LAB — CBC
MCH: 32.2 pg (ref 26.0–34.0)
MCHC: 31 g/dL (ref 30.0–36.0)
Platelets: 166 10*3/uL (ref 150–400)
RDW: 20.9 % — ABNORMAL HIGH (ref 11.5–15.5)

## 2012-06-10 NOTE — Consult Note (Signed)
[facility] Consultation Note  Referring Provider: Dr. Christin Bach Primary Care Physician:  Lillia Mountain, MD  Reason for Consultation:  anemia  HPI: [name] is a 66 y.o. female whom we've been asked to see for evaluation of anemia.  Patient with history of multiple myeloma followed by Dr. Myna Hidalgo.  Recently admitted for progressive weakness and worsening anemia.  She denies any nausea, vomiting, dysphagia, abdominal pain, change in bowel habits, hematemesis, melena, hematochezia.  She had endoscopy, but many years ago, unclear reasons.  Had colonoscopy by her report within the past year or two, but I can't locate those records on the hospital-based or [facility]-based electronic medical record.   Past Medical History  Diagnosis Date  . Myeloma   . Anxiety   . Hypothyroidism   . GERD (gastroesophageal reflux disease)   . Myeloma   . OAB (overactive bladder)   . COPD (chronic obstructive pulmonary disease)   . Osteoporosis   . Depression   . Chronic pain   . Renal insufficiency     Past Surgical History  Procedure Date  . Leg surgery   . Cholecystectomy   . Tonsillectomy   . Breast surgery   . Limbal stem cell transplant   . Abdominal hysterectomy     Prior to Admission medications   Medication Sig Start Date End Date Taking? Authorizing Provider  acyclovir (ZOVIRAX) 400 MG tablet Take 800 mg by mouth 2 (two) times daily.   Yes Historical Provider, MD  ALPRAZolam Prudy Feeler) 0.25 MG tablet Take 0.25 mg by mouth at bedtime as needed. Sleep.   Yes Historical Provider, MD  Alum & Mag Hydroxide-Simeth (MAGIC MOUTHWASH) SOLN Take 5 mLs by mouth 4 (four) times daily as needed. [facility]'s formula--no Simethicone.   Yes Historical Provider, MD  aspirin EC 81 MG tablet Take 81 mg by mouth at bedtime.   Yes Historical Provider, MD  benzonatate (TESSALON) 100 MG capsule Take 100 mg by mouth daily as needed. Cough.   Yes Historical Provider, MD  celecoxib (CELEBREX) 100 MG  capsule Take 100 mg by mouth 2 (two) times daily.   Yes Historical Provider, MD  DULoxetine (CYMBALTA) 30 MG capsule Take 30 mg by mouth daily.   Yes Historical Provider, MD  esomeprazole (NEXIUM) 40 MG capsule Take 40 mg by mouth daily before breakfast.   Yes Historical Provider, MD  Fluticasone-Salmeterol (ADVAIR) 250-50 MCG/DOSE AEPB Inhale 1 puff into the lungs every 12 (twelve) hours.   Yes Historical Provider, MD  HYDROcodone-acetaminophen (NORCO/VICODIN) 5-325 MG per tablet Take 1-2 tablets by mouth every 8 (eight) hours as needed. Pain.   Yes Historical Provider, MD  HYDROcodone-homatropine (HYCODAN) 5-1.5 MG/5ML syrup Take 5 mLs by mouth every 6 (six) hours as needed. Cough.   Yes Historical Provider, MD  hydrOXYzine (ATARAX/VISTARIL) 25 MG tablet Take 25 mg by mouth 2 (two) times daily as needed. Allergy.   Yes Historical Provider, MD  levothyroxine (SYNTHROID, LEVOTHROID) 50 MCG tablet Take 50 mcg by mouth at bedtime.    Yes Historical Provider, MD  LORazepam (ATIVAN) 1 MG tablet Take 1 mg by mouth every 8 (eight) hours as needed. Nausea    Yes Historical Provider, MD  olopatadine (PATANOL) 0.1 % ophthalmic solution Place 1 drop into both eyes 2 (two) times daily.    Yes Historical Provider, MD  ondansetron (ZOFRAN) 8 MG tablet every 12 (twelve) hours as needed.  12/20/11  Yes Historical Provider, MD  pirbuterol (MAXAIR) 200 MCG/INH inhaler Inhale 1-2 puffs into the  lungs every 12 (twelve) hours as needed. Wheezing/shortness of breath.  Use sparingly.   Yes Historical Provider, MD  pomalidomide (POMALYST) 3 MG capsule Take 3 mg by mouth daily.   Yes Historical Provider, MD  sulfamethoxazole-trimethoprim (BACTRIM DS) 800-160 MG per tablet Take 1 tablet by mouth 3 (three) times a week. On Monday, Wednesday and Friday.   Yes Historical Provider, MD  temazepam (RESTORIL) 30 MG capsule Take 30 mg by mouth at bedtime as needed. Sleep.   Yes Historical Provider, MD  tolterodine (DETROL LA) 4 MG 24  hr capsule Take 4 mg by mouth daily.     Yes Historical Provider, MD  Vitamin D, Ergocalciferol, (DRISDOL) 50000 UNITS CAPS Take 50,000 Units by mouth every 7 (seven) days. Take on Monday    Historical Provider, MD    Current Facility-Administered Medications  Medication Dose Route Frequency Provider Last Rate Last Dose  . 0.9 %  sodium chloride infusion   Intravenous Continuous Josph Macho, MD 20 mL/hr at 06/09/12 1327    . acyclovir (ZOVIRAX) tablet 400 mg  400 mg Oral BID Josph Macho, MD   400 mg at 06/10/12 1005  . albuterol (PROVENTIL HFA;VENTOLIN HFA) 108 (90 BASE) MCG/ACT inhaler 1-2 puff  1-2 puff Inhalation TID Josph Macho, MD   2 puff at 06/10/12 0804  . albuterol (PROVENTIL) (5 MG/ML) 0.5% nebulizer solution 2.5 mg  2.5 mg Nebulization Q6H PRN Josph Macho, MD      . ALPRAZolam Prudy Feeler) tablet 0.25 mg  0.25 mg Oral TID PRN Josph Macho, MD   0.25 mg at 06/09/12 2137  . aspirin EC tablet 81 mg  81 mg Oral QHS Josph Macho, MD   81 mg at 06/09/12 2137  . celecoxib (CELEBREX) capsule 100 mg  100 mg Oral BID Josph Macho, MD   100 mg at 06/10/12 1005  . chlorhexidine (PERIDEX) 0.12 % solution 15 mL  15 mL Mouth Rinse BID Josph Macho, MD   15 mL at 06/10/12 1005  . cyclobenzaprine (FLEXERIL) tablet 5 mg  5 mg Oral TID PRN Josph Macho, MD   5 mg at 06/09/12 2137  . darbepoetin (ARANESP) injection 300 mcg  300 mcg Subcutaneous Once Josph Macho, MD   300 mcg at 06/09/12 1430  . DULoxetine (CYMBALTA) DR capsule 30 mg  30 mg Oral Daily Josph Macho, MD   30 mg at 06/10/12 1005  . fesoterodine (TOVIAZ) tablet 4 mg  4 mg Oral Daily Josph Macho, MD   4 mg at 06/10/12 1005  . Fluticasone-Salmeterol (ADVAIR) 250-50 MCG/DOSE inhaler 1 puff  1 puff Inhalation Q12H Josph Macho, MD   1 puff at 06/10/12 0803  . heparin lock flush 100 unit/mL  500 Units Intracatheter Daily PRN Josph Macho, MD      . HYDROcodone-acetaminophen (NORCO/VICODIN) 5-325 MG per  tablet 1-2 tablet  1-2 tablet Oral Q6H PRN Si Gaul, MD   1 tablet at 06/09/12 2320  . HYDROcodone-homatropine (HYCODAN) 5-1.5 MG/5ML syrup 5 mL  5 mL Oral Q6H PRN Josph Macho, MD   5 mL at 06/09/12 0845  . levothyroxine (SYNTHROID, LEVOTHROID) tablet 50 mcg  50 mcg Oral QAC breakfast Josph Macho, MD   50 mcg at 06/10/12 1004  . LORazepam (ATIVAN) tablet 1 mg  1 mg Oral Q8H PRN Josph Macho, MD   1 mg at 06/09/12 2320  . magic mouthwash  5 mL Oral  QID PRN Josph Macho, MD      . olopatadine (PATANOL) 0.1 % ophthalmic solution 1 drop  1 drop Both Eyes BID Josph Macho, MD   1 drop at 06/10/12 1005  . pantoprazole (PROTONIX) EC tablet 80 mg  80 mg Oral Daily Josph Macho, MD   80 mg at 06/10/12 1004  . polyethylene glycol (MIRALAX / GLYCOLAX) packet 17 g  17 g Oral Daily Josph Macho, MD   17 g at 06/10/12 1004  . sodium chloride 0.9 % injection 10 mL  10 mL Intracatheter PRN Josph Macho, MD      . Vitamin D (Ergocalciferol) (DRISDOL) capsule 50,000 Units  50,000 Units Oral Q7 days Josph Macho, MD   50,000 Units at 06/05/12 1851  . DISCONTD: enoxaparin (LOVENOX) injection 40 mg  40 mg Subcutaneous Q24H Gwen Her, PHARMD   40 mg at 06/08/12 1830  . DISCONTD: HYDROcodone-acetaminophen (NORCO/VICODIN) 5-325 MG per tablet 1 tablet  1 tablet Oral Q6H PRN Josph Macho, MD   1 tablet at 06/07/12 2138   Facility-Administered Medications Ordered in Other Encounters  Medication Dose Route Frequency Provider Last Rate Last Dose  . DISCONTD: darbepoetin (ARANESP) injection 300 mcg  300 mcg Subcutaneous Once Josph Macho, MD        Allergies as of 06/05/2012 - Review Complete 06/05/2012  Allergen Reaction Noted  . Trazodone and nefazodone Other (See Comments) 01/28/2012  . Hydromorphone Itching 09/13/2011  . Morphine and related Rash 06/15/2011  . Penicillins Rash 06/15/2011    History reviewed. No pertinent family history.  History   Social History    . Marital Status: Widowed    Spouse Name: N/A    Number of Children: N/A  . Years of Education: N/A   Occupational History  . Not on file.   Social History Main Topics  . Smoking status: Never Smoker   . Smokeless tobacco: Never Used  . Alcohol Use: No  . Drug Use: No  . Sexually Active: No   Other Topics Concern  . Not on file   Social History Narrative  . No narrative on file    Review of Systems: Positive bold Gen: Denies any fever, chills, rigors, night sweats, anorexia, fatigue, weakness, malaise, involuntary weight loss, and sleep disorder CV: Denies chest pain, angina, palpitations, syncope, orthopnea, PND, peripheral edema, and claudication. Resp: Denies dyspnea, cough, sputum, wheezing, coughing up blood. GI: Described in detail in HPI.    GU : Denies urinary burning, blood in urine, urinary frequency, urinary hesitancy, nocturnal urination, and urinary incontinence. MS: Denies joint pain or swelling.  Denies muscle weakness, cramps, atrophy.  Derm: Denies rash, itching, oral ulcerations, hives, unhealing ulcers.  Psych: Denies depression, anxiety, memory loss, suicidal ideation, hallucinations,  and confusion. Heme: Denies bruising, bleeding, and enlarged lymph nodes. Neuro:  Denies any headaches, dizziness, paresthesias. Endo:  Denies any problems with DM, thyroid, adrenal function.  Physical Exam: Vital signs in last 24 hours: Temp:  [98 F (36.7 C)-98.4 F (36.9 C)] 98 F (36.7 C) (08/24 0500) Pulse Rate:  [71-82] 71  (08/24 0500) Resp:  [18] 18  (08/24 0500) BP: (108-137)/(59-80) 108/76 mmHg (08/24 0500) SpO2:  [89 %-100 %] 89 % (08/24 0813) Last BM Date: 06/09/12 General:   Alert, chronically ill-appearing but in no acute distress, pleasant and cooperative in NAD Head:  Normocephalic and atraumatic. Eyes:  Sclera clear, no icterus.   Conjunctiva somewhat pale Mouth:  No deformity or  lesions.  Oropharynx pink & moist. Neck:  Supple; no masses or  thyromegaly. Lungs:  Clear throughout to auscultation.   No wheezes, crackles, or rhonchi. No acute distress. Heart:  Regular rate and rhythm; no murmurs, clicks, rubs,  or gallops. Abdomen:  Soft, ventral hernia, nontender and nondistended. No masses, hepatosplenomegaly or hernias noted. Normal bowel sounds, without guarding, and without rebound.     Msk:  Symmetrical without gross deformities. Normal posture. Pulses:  Normal pulses noted. Extremities:  Without clubbing or edema. Neurologic:  Diffusely weak but non-focal without lateralizing signs. Skin:  Intact without significant lesions or rashes. Psych:  Alert, normal mood and affect  Lab Results:  Basename 06/10/12 0645 06/09/12 0430 06/08/12 0440  WBC 3.6* 3.5* 4.7  HGB 8.9* 8.1* 8.1*  HCT 28.7* 25.0* 24.8*  PLT 166 137* 124*   BMET  Basename 06/10/12 0645 06/09/12 0430 06/08/12 0440  NA 141 140 138  K 4.0 3.5 3.9  CL 104 105 104  CO2 28 27 26   GLUCOSE 96 94 107*  BUN 15 13 16   CREATININE 1.19* 1.11* 1.29*  CALCIUM 8.9 8.6 8.8   LFT No results found for this basename: PROT,ALBUMIN,AST,ALT,ALKPHOS,BILITOT,BILIDIR,IBILI in the last 72 hours PT/INR No results found for this basename: LABPROT:2,INR:2 in the last 72 hours  Studies/Results: No results found.  Impression:  1.  Anemia.   2.  Hemoccult-positive stool, but no overt GI bleeding. 4.  Multiple myeloma.  Plan:  1.  Endoscopy tomorrow. 2.  Try to obtain outside colonoscopy report; however, if one has been done over the past 1-2 years, I doubt there is much utility in repeating it in absence of overt bleeding. 3.  Risks (bleeding, infection, bowel perforation that could require surgery, sedation-related changes in cardiopulmonary systems), benefits (identification and possible treatment of source of symptoms, exclusion of certain causes of symptoms), and alternatives (watchful waiting, radiographic imaging studies, empiric medical treatment) of upper endoscopy  (EGD) were explained to patient in detail and she wishes to proceed.   LOS: 5 days   Gabryella Murfin M  06/10/2012, 12:46 PM

## 2012-06-10 NOTE — Progress Notes (Signed)
Subjective: The patient is seen and examined. She is feeling well today with no specific complaints. She is afebrile. No nausea or vomiting, no constipation. Hb is better today. No rectal bleeding.  Objective: Vital signs in last 24 hours: Temp:  [98 F (36.7 C)-98.4 F (36.9 C)] 98 F (36.7 C) (08/24 0500) Pulse Rate:  [71-82] 71  (08/24 0500) Resp:  [18] 18  (08/24 0500) BP: (108-137)/(59-80) 108/76 mmHg (08/24 0500) SpO2:  [89 %-100 %] 89 % (08/24 0813)  Intake/Output from previous day: 08/23 0701 - 08/24 0700 In: 1161 [P.O.:440; I.V.:721] Out: 1400 [Urine:1400] Intake/Output this shift:    General appearance: alert, cooperative and no distress Resp: clear to auscultation bilaterally Cardio: regular rate and rhythm, S1, S2 normal, no murmur, click, rub or gallop GI: soft, non-tender; bowel sounds normal; no masses,  no organomegaly Extremities: extremities normal, atraumatic, no cyanosis or edema  Lab Results:   Basename 06/10/12 0645 06/09/12 0430  WBC 3.6* 3.5*  HGB 8.9* 8.1*  HCT 28.7* 25.0*  PLT 166 137*   BMET  Basename 06/10/12 0645 06/09/12 0430  NA 141 140  K 4.0 3.5  CL 104 105  CO2 28 27  GLUCOSE 96 94  BUN 15 13  CREATININE 1.19* 1.11*  CALCIUM 8.9 8.6    Studies/Results: No results found.  Medications: I have reviewed the patient's current medications.  Assessment/Plan: 1) Refractory Multiple Myeloma: Dr. Myna Hidalgo will decide future treatment plan. 2) Anemia: stable. No need for transfusion today. Continue Aranesp.   LOS: 5 days    La Shehan K. 06/10/2012

## 2012-06-11 ENCOUNTER — Encounter (HOSPITAL_COMMUNITY): Payer: Self-pay | Admitting: *Deleted

## 2012-06-11 ENCOUNTER — Encounter (HOSPITAL_COMMUNITY)
Admission: AD | Disposition: A | Discharge status: Home Health | Payer: Self-pay | Source: Ambulatory Visit | Attending: Hematology & Oncology

## 2012-06-11 DIAGNOSIS — B3781 Candidal esophagitis: Secondary | ICD-10-CM

## 2012-06-11 HISTORY — PX: ESOPHAGOGASTRODUODENOSCOPY: SHX5428

## 2012-06-11 LAB — BASIC METABOLIC PANEL
BUN: 13 mg/dL (ref 6–23)
CO2: 29 mEq/L (ref 19–32)
Chloride: 105 mEq/L (ref 96–112)
Glucose, Bld: 94 mg/dL (ref 70–99)
Potassium: 3.9 mEq/L (ref 3.5–5.1)

## 2012-06-11 LAB — CBC
HCT: 27.6 % — ABNORMAL LOW (ref 36.0–46.0)
Hemoglobin: 8.7 g/dL — ABNORMAL LOW (ref 12.0–15.0)
RBC: 2.67 MIL/uL — ABNORMAL LOW (ref 3.87–5.11)
WBC: 4.2 10*3/uL (ref 4.0–10.5)

## 2012-06-11 SURGERY — EGD (ESOPHAGOGASTRODUODENOSCOPY)
Anesthesia: Moderate Sedation

## 2012-06-11 MED ORDER — FLUCONAZOLE 200 MG PO TABS
200.0000 mg | ORAL_TABLET | Freq: Once | ORAL | Status: AC
  Administered 2012-06-11: 200 mg via ORAL
  Filled 2012-06-11: qty 1

## 2012-06-11 MED ORDER — MIDAZOLAM HCL 10 MG/2ML IJ SOLN
INTRAMUSCULAR | Status: AC
  Filled 2012-06-11: qty 4

## 2012-06-11 MED ORDER — FENTANYL CITRATE 0.05 MG/ML IJ SOLN
INTRAMUSCULAR | Status: DC | PRN
  Administered 2012-06-11 (×5): 25 ug via INTRAVENOUS

## 2012-06-11 MED ORDER — PANTOPRAZOLE SODIUM 40 MG IV SOLR
40.0000 mg | Freq: Two times a day (BID) | INTRAVENOUS | Status: DC
  Administered 2012-06-11 – 2012-06-12 (×4): 40 mg via INTRAVENOUS
  Filled 2012-06-11 (×9): qty 40

## 2012-06-11 MED ORDER — BUTAMBEN-TETRACAINE-BENZOCAINE 2-2-14 % EX AERO
INHALATION_SPRAY | CUTANEOUS | Status: DC | PRN
  Administered 2012-06-11: 2 via TOPICAL

## 2012-06-11 MED ORDER — FLUCONAZOLE 100 MG PO TABS
100.0000 mg | ORAL_TABLET | Freq: Every day | ORAL | Status: DC
  Administered 2012-06-12 – 2012-06-14 (×3): 100 mg via ORAL
  Filled 2012-06-11 (×4): qty 1

## 2012-06-11 MED ORDER — SODIUM CHLORIDE 0.9 % IV SOLN: INTRAVENOUS | Status: DC

## 2012-06-11 MED ORDER — MIDAZOLAM HCL 10 MG/2ML IJ SOLN
INTRAMUSCULAR | Status: DC | PRN
  Administered 2012-06-11 (×5): 2.5 mg via INTRAVENOUS

## 2012-06-11 MED ORDER — DIPHENHYDRAMINE HCL 50 MG/ML IJ SOLN
INTRAMUSCULAR | Status: AC
  Filled 2012-06-11: qty 1

## 2012-06-11 MED ORDER — FENTANYL CITRATE 0.05 MG/ML IJ SOLN
INTRAMUSCULAR | Status: AC
  Filled 2012-06-11: qty 4

## 2012-06-11 NOTE — Op Note (Signed)
[facility] [address] [place], [address]   ENDOSCOPY PROCEDURE REPORT  PATIENT: [name], [name]  MR#: [id number] BIRTHDATE: 1945-11-10 , 66  yrs. old GENDER: Female ENDOSCOPIST: Willis Modena, MD REFERRED BY:  Arlan Organ, M.D.  PROCEDURE DATE:  06/11/2012 PROCEDURE:  EGD, diagnostic  ASA CLASS:     Class III INDICATIONS:  anemia.   heme positive stool. MEDICATIONS: Cetacaine spray x 2, Fentanyl 125 mcg IV, and Versed 12.5 mg IV TOPICAL ANESTHETIC:  DESCRIPTION OF PROCEDURE: After the risks benefits and alternatives of the procedure were thoroughly explained, informed consent was obtained.  The Pentax Gastroscope [id number] endoscope was introduced through the mouth and advanced to the second portion of the duodenum. Without limitations.  The instrument was slowly withdrawn as the mucosa was fully examined.     Findings:  Mild candida esophagitis of proximal and mid esophagus. Moderate hiatal hernia.  Moderate (LA B/C) distal erosive erosive ulcerative esophagitis.  Small proximal gastric polyp.  Otherwise normal exam to second portion of duodenum.  ENDOSCOPIC IMPRESSION:     1.  As above. 2.  Hiatal hernia and esophagitis could lead to a degree of anemia.  RECOMMENDATIONS:     1.  Watch for potential complications including bleeding, chest pain, perforation. 2.  anti-reflux regimen to be follow 3.  Intensive PPI therapy. 4.  10-day course of fluconazole. 5.  Will follow.   eSigned:  Willis Modena, MD 06/11/2012 9:21 AM   CC:

## 2012-06-11 NOTE — Interval H&P Note (Signed)
History and Physical Interval Note:  06/11/2012 8:47 AM  [name]  has presented today for surgery, with the diagnosis of anemia  The various methods of treatment have been discussed with the patient and family. After consideration of risks, benefits and other options for treatment, the patient has consented to  Procedure(s) (LRB): ESOPHAGOGASTRODUODENOSCOPY (EGD) (N/A) as a surgical intervention .  The patient's history has been reviewed, patient examined, no change in status, stable for surgery.  I have reviewed the patient's chart and labs.  Questions were answered to the patient's satisfaction.     [name]  Assessment: 1.  Anemia. 2.  Hemoccult-positive stool.  Plan: 1.  Endoscopy. 2.  Risks (bleeding, infection, bowel perforation that could require surgery, sedation-related changes in cardiopulmonary systems), benefits (identification and possible treatment of source of symptoms, exclusion of certain causes of symptoms), and alternatives (watchful waiting, radiographic imaging studies, empiric medical treatment) of upper endoscopy (EGD) were explained to patient in detail and she wishes to proceed.

## 2012-06-11 NOTE — Progress Notes (Signed)
[name]   DOB:10-May-1946   ZO#:[id number]   JWJ#:[id number]  Subjective: patient sleeping, awoke easily, denies pain, overt bleeding, any breathing problems. No family in room   Objective: middle-aged White woman examined in bed Filed Vitals:   06/11/12 0935  BP: 95/52  Pulse:   Temp:   Resp: 12    Body mass index is 28.32 kg/(m^2).  Intake/Output Summary (Last 24 hours) at 06/11/12 1136 Last data filed at 06/11/12 0926  Gross per 24 hour  Intake 1031.67 ml  Output   2000 ml  Net -968.33 ml     Sclerae unicteric  Oropharynx no thrush  Lungs clear -- examined anterolaterally  Heart regular rate and rhythm  Abdomen benign  MSK no focal spinal tenderness, no peripheral edema  Neuro nonfocal    CBG (last 3)  No results found for this basename: GLUCAP:3 in the last 72 hours   Labs:  Lab Results  Component Value Date   WBC 4.2 06/11/2012   HGB 8.7* 06/11/2012   HCT 27.6* 06/11/2012   MCV 103.4* 06/11/2012   PLT 172 06/11/2012   NEUTROABS 6.1 06/06/2012    Urine Studies No results found for this basename: UACOL:2,UAPR:2,USPG:2,UPH:2,UTP:2,UGL:2,UKET:2,UBIL:2,UHGB:2,UNIT:2,UROB:2,ULEU:2,UEPI:2,UWBC:2,URBC:2,UBAC:2,CAST:2,CRYS:2,UCOM:2,BILUA:2 in the last 72 hours  Basic Metabolic Panel:  Lab 06/11/12 6213 06/10/12 0645 06/09/12 0430 06/08/12 0440 06/07/12 0515  NA 141 141 140 138 138  K 3.9 4.0 -- -- --  CL 105 104 105 104 105  CO2 29 28 27 26 25   GLUCOSE 94 96 94 107* 115*  BUN 13 15 13 16 21   CREATININE 1.15* 1.19* 1.11* 1.29* 1.34*  CALCIUM 8.8 8.9 8.6 8.8 9.1  MG -- -- 1.9 -- --  PHOS -- -- -- -- --   GFR Estimated Creatinine Clearance: 39.6 ml/min (by C-G formula based on Cr of 1.15). Liver Function Tests:  Lab 06/06/12 0500 06/05/12 1338  AST 18 14  ALT 16 14  ALKPHOS 45 39  BILITOT 0.7 0.3  PROT 5.5* 5.3*  ALBUMIN 2.7* 3.2*   No results found for this basename: LIPASE:5,AMYLASE:5 in the last 168 hours No results found for this basename:  AMMONIA:5 in the last 168 hours Coagulation profile  Lab 06/06/12 0825  INR 1.06  PROTIME --    CBC:  Lab 06/11/12 0500 06/10/12 0645 06/09/12 0430 06/08/12 0440 06/07/12 0515 06/06/12 0500  WBC 4.2 3.6* 3.5* 4.7 4.9 --  NEUTROABS -- -- -- -- -- 6.1  HGB 8.7* 8.9* 8.1* 8.1* 8.2* --  HCT 27.6* 28.7* 25.0* 24.8* 25.0* --  MCV 103.4* 104.0* 102.5* 102.1* 100.0 --  PLT 172 166 137* 124* 128* --   Cardiac Enzymes: No results found for this basename: CKTOTAL:5,CKMB:5,CKMBINDEX:5,TROPONINI:5 in the last 168 hours BNP: No components found with this basename: POCBNP:5 CBG: No results found for this basename: GLUCAP:5 in the last 168 hours D-Dimer No results found for this basename: DDIMER:2 in the last 72 hours Hgb A1c No results found for this basename: HGBA1C:2 in the last 72 hours Lipid Profile No results found for this basename: CHOL:2,HDL:2,LDLCALC:2,TRIG:2,CHOLHDL:2,LDLDIRECT:2 in the last 72 hours Thyroid function studies No results found for this basename: TSH,T4TOTAL,FREET3,T3FREE,THYROIDAB in the last 72 hours Anemia work up No results found for this basename: VITAMINB12:2,FOLATE:2,FERRITIN:2,TIBC:2,IRON:2,RETICCTPCT:2 in the last 72 hours Microbiology No results found for this or any previous visit (from the past 240 hour(s)).    Studies:  No results found.  Assessment: 66 y.o. [name] woman with  1) refractory myeloma, currently with 25% plasma cells in bone  marrow  2) anemia requiring transfusion  3) Mild candida esophagitis of proximal and mid esophagus.   4)Moderate hiatal hernia.   5) Moderate (LA B/C) distal erosive erosive  ulcerative esophagitis.   Plan:tolerated her procedures well; hemoglobin stable; likely anemia is due to her myeloma; already on darbepoietin; anticipate discharge soon   MAGRINAT,GUSTAV C 06/11/2012

## 2012-06-12 ENCOUNTER — Encounter (HOSPITAL_COMMUNITY): Payer: Self-pay | Admitting: Gastroenterology

## 2012-06-12 DIAGNOSIS — R609 Edema, unspecified: Secondary | ICD-10-CM

## 2012-06-12 DIAGNOSIS — I369 Nonrheumatic tricuspid valve disorder, unspecified: Secondary | ICD-10-CM

## 2012-06-12 LAB — IRON AND TIBC: UIBC: 126 ug/dL (ref 125–400)

## 2012-06-12 LAB — BASIC METABOLIC PANEL
BUN: 15 mg/dL (ref 6–23)
CO2: 32 mEq/L (ref 19–32)
Calcium: 9.3 mg/dL (ref 8.4–10.5)
GFR calc non Af Amer: 39 mL/min — ABNORMAL LOW (ref >90)
Glucose, Bld: 111 mg/dL — ABNORMAL HIGH (ref 70–99)

## 2012-06-12 LAB — CBC
HCT: 28.8 % — ABNORMAL LOW (ref 36.0–46.0)
Hemoglobin: 9 g/dL — ABNORMAL LOW (ref 12.0–15.0)
MCH: 32.5 pg (ref 26.0–34.0)
MCHC: 31.3 g/dL (ref 30.0–36.0)

## 2012-06-12 LAB — PREPARE RBC (CROSSMATCH)

## 2012-06-12 MED ORDER — TEMAZEPAM 15 MG PO CAPS
30.0000 mg | ORAL_CAPSULE | Freq: Once | ORAL | Status: AC
  Administered 2012-06-12: 30 mg via ORAL
  Filled 2012-06-12 (×2): qty 1

## 2012-06-12 MED ORDER — FUROSEMIDE 10 MG/ML IJ SOLN
10.0000 mg | Freq: Once | INTRAMUSCULAR | Status: AC
  Administered 2012-06-12: 10 mg via INTRAVENOUS
  Filled 2012-06-12: qty 1

## 2012-06-12 MED ORDER — ALTEPLASE 2 MG IJ SOLR
2.0000 mg | Freq: Once | INTRAMUSCULAR | Status: AC
  Administered 2012-06-12: 2 mg
  Filled 2012-06-12 (×2): qty 2

## 2012-06-12 MED ORDER — ALTEPLASE 100 MG IV SOLR
2.0000 mg | Freq: Once | INTRAVENOUS | Status: DC
  Administered 2012-06-12: 2 mg
  Filled 2012-06-12: qty 2

## 2012-06-12 NOTE — Care Management Note (Signed)
    Page 1 of 1   06/14/2012     11:47:40 AM   CARE MANAGEMENT NOTE 06/14/2012  Patient:  [name], [name]   Account Number:  [id number]  Date Initiated:  06/12/2012  Documentation initiated by:  Lanier Clam  Subjective/Objective Assessment:   ADMITTED W/ANEMIA.     Action/Plan:   FROM HOME   Anticipated DC Date:  06/14/2012   Anticipated DC Plan:  HOME/SELF CARE      DC Planning Services  CM consult      Choice offered to / List presented to:             Status of service:  Completed, signed off Medicare Important Message given?   (If response is "NO", the following Medicare IM given date fields will be blank) Date Medicare IM given:   Date Additional Medicare IM given:    Discharge Disposition:  HOME/SELF CARE  Per UR Regulation:  Reviewed for med. necessity/level of care/duration of stay  If discussed at Long Length of Stay Meetings, dates discussed:   06/13/2012    Comments:  06/14/12 Sinjin Amero RN,BSN NCM [phone] D/C HOME.NO HH ORDERS.  06/12/12 Natasha Burda RN,BSN NCM [phone] 1UPRBC,IV LASIX.

## 2012-06-12 NOTE — Progress Notes (Signed)
  Echocardiogram 2D Echocardiogram has been performed.  [name] 06/12/2012, 3:51 PM

## 2012-06-12 NOTE — Progress Notes (Addendum)
ANTICOAGULATION CONSULT NOTE: Lovenox Dose Adjustment  Pharmacy Consult for Lovenox Indication: VTE prophylaxis  Allergies  Allergen Reactions  . Trazodone And Nefazodone Other (See Comments)    nightmares  . Hydromorphone Itching  . Morphine And Related Rash  . Penicillins Rash    Patient Measurements: Height: 4\' 11"  (149.9 cm) Weight: 140 lb 3.4 oz (63.6 kg) IBW/kg (Calculated) : 43.2    Vital Signs: Temp: 97.6 F (36.4 C) (08/26 0556) Temp src: Oral (08/26 0556) BP: 132/58 mmHg (08/26 0556) Pulse Rate: 86  (08/26 0556)  Labs:  Basename 06/12/12 0705 06/11/12 0500 06/10/12 0645  HGB 9.0* 8.7* --  HCT 28.8* 27.6* 28.7*  PLT 192 172 166  APTT -- -- --  LABPROT -- -- --  INR -- -- --  HEPARINUNFRC -- -- --  CREATININE 1.39* 1.15* 1.19*  CKTOTAL -- -- --  CKMB -- -- --  TROPONINI -- -- --    Estimated Creatinine Clearance: 32.7 ml/min (by C-G formula based on Cr of 1.39).   Medical History: Past Medical History  Diagnosis Date  . Myeloma   . Anxiety   . Hypothyroidism   . GERD (gastroesophageal reflux disease)   . Myeloma   . OAB (overactive bladder)   . COPD (chronic obstructive pulmonary disease)   . Osteoporosis   . Depression   . Chronic pain   . Renal insufficiency     Medications:  Scheduled:     . acyclovir  400 mg Oral BID  . albuterol  1-2 puff Inhalation TID  . celecoxib  100 mg Oral BID  . chlorhexidine  15 mL Mouth Rinse BID  . DULoxetine  30 mg Oral Daily  . fesoterodine  4 mg Oral Daily  . fluconazole  100 mg Oral Daily  . fluconazole  200 mg Oral Once  . Fluticasone-Salmeterol  1 puff Inhalation Q12H  . furosemide  10 mg Intravenous Once  . levothyroxine  50 mcg Oral QAC breakfast  . olopatadine  1 drop Both Eyes BID  . pantoprazole (PROTONIX) IV  40 mg Intravenous Q12H  . polyethylene glycol  17 g Oral Daily  . Vitamin D (Ergocalciferol)  50,000 Units Oral Q7 days  . DISCONTD: aspirin EC  81 mg Oral QHS  . DISCONTD:  pantoprazole  80 mg Oral Daily    Assessment:  80 YOF admitted 8/19 with weakness and anemia.   Lovenox for VTE ppx ordered, pharmacy may adjust. Currently on 40mg  sq q24h.  CrCl remains >30. Wt >45kg.  S/P bone marrow biopsy 8/20.  Fecal occult blood positive 8/23 but CBC stable and VSS, no overt bleeding reported. Endo 8/25 = erosive esophagitis  On Aranesp, Protonix. ECASA 81mg  and Celebrex.  Goal of Therapy:  Appropriate Lovenox dose for VTE ppx Monitor platelets by anticoagulation protocol: Yes   Plan:   Cont. Lovenox to 40mg  SQ q24h.  Cont daily f/u.  Gwen Her PharmD  [phone] 06/12/2012 8:47 AM

## 2012-06-12 NOTE — Progress Notes (Signed)
An outstanding job by Dr. Dulce Sellar over the weekend. I am very much appreciate is prompt intervention in the endoscopy that he did. Hopefully, we can improve her anemia.  He is in better spirits today. He was a tough Friday when I last saw her. She had a lot to think about with respect to her bone marrow being a little bit worse. She has a better outlook.  She is very motivated. Hopefully physical therapy will come by today.  I do think that a blood transfusion can help her. I want to focus on her quality of life and her functional status. I have taken her iron levels. Her last ferritin was quite high which I think is more of an acute phase reactant.  Her vital signs are all stable. Blood pressure 132/58. Her exam is pretty much unchanged. Lungs are clear. Cardiac exam regular rate and rhythm. Oral exam is without mucositis or thrush. Abdominal exam soft. Slightly obese. She has good bowel sounds. No palpable hepatosplenomegaly. Extremities shows some chronic nonpitting edema in her lower legs. Skin exam no rashes. Neurological exam no focal neurological deficits.  We will go ahead and give her one unit of blood today. Again, I believe this will help with her functional status. We will try to get her ready for discharge by Wednesday. The family has a beach trip planned for the weekend. She really would like to go.   As far as myeloma therapy goes, we might be able to consider a couple options for treatment which we can do as an outpatient.   Pete E.

## 2012-06-12 NOTE — Progress Notes (Signed)
Physical Therapy Treatment Patient Details Name: [name] MRN: [id number] DOB: 07-17-1946 Today's Date: 06/12/2012 Time: 4098-1191 PT Time Calculation (min): 25 min  PT Assessment / Plan / Recommendation Comments on Treatment Session  Pt very motivated to get better.  Currently receiving one unit of blood even though her HgB is 9.0.       Follow Up Recommendations  Home health PT;Supervision - Intermittent    Barriers to Discharge        Equipment Recommendations  None recommended by PT    Recommendations for Other Services    Frequency Min 3X/week   Plan Discharge plan remains appropriate    Precautions / Restrictions Precautions Precautions: Fall Restrictions Weight Bearing Restrictions: No    Pertinent Vitals/Pain C/o low back pain meds requested repositioned    Mobility  Bed Mobility Bed Mobility: Supine to Sit Supine to Sit: 6: Modified independent (Device/Increase time) Details for Bed Mobility Assistance: increased time  Transfers Transfers: Sit to Stand;Stand to Sit Sit to Stand: 5: Supervision;From bed Stand to Sit: 5: Supervision;To chair/3-in-1 Details for Transfer Assistance: <25% VC's for safety and turn completion prior to sit  Ambulation/Gait Ambulation/Gait Assistance: 5: Supervision Assistive device: 4-wheeled walker Ambulation/Gait Assistance Details: Pt c/o low back pain that comes and goes so she was leaining her forearms on walker hand supports for increased support and back relief pain. Gait Pattern: Step-through pattern;Decreased stride length;Trunk flexed Gait velocity: decreased     PT Goals                                       progressing    Visit Information  Last PT Received On: 06/12/12 Assistance Needed: +1    Subjective Data  Subjective: My back hurts Patient Stated Goal: home   Cognition    good   Balance   fair+  End of Session PT - End of Session Equipment Utilized During Treatment: Gait belt Activity  Tolerance: Patient limited by pain Patient left: in chair;with call bell/phone within reach Nurse Communication: Mobility status   Felecia Shelling  PTA [facility]  Acute  Rehab Pager     [phone]

## 2012-06-12 NOTE — Progress Notes (Signed)
[facility] PROGRESS NOTE Subjective Pt doing well due to be discharged Wed. Pt notes that she has had colonoscopy within the last 2 years but can't remember who did it or where was done.   Objective: Vital signs in last 24 hours: Temp:  [97.6 F (36.4 C)-98.2 F (36.8 C)] 97.6 F (36.4 C) (08/26 0556) Pulse Rate:  [83-87] 86  (08/26 0556) Resp:  [11-20] 20  (08/26 0556) BP: (95-139)/(52-59) 132/58 mmHg (08/26 0556) SpO2:  [92 %-96 %] 92 % (08/26 0824) Last BM Date: 06/11/12  Intake/Output from previous day: 08/25 0701 - 08/26 0700 In: 906.7 [P.O.:480; I.V.:426.7] Out: 2500 [Urine:2500] Intake/Output this shift:      Lab Results:  Basename 06/12/12 0705 06/11/12 0500 06/10/12 0645  WBC 4.6 4.2 3.6*  HGB 9.0* 8.7* 8.9*  HCT 28.8* 27.6* 28.7*  PLT 192 172 166   BMET  Basename 06/12/12 0705 06/11/12 0500 06/10/12 0645  NA 142 141 141  K 4.4 3.9 4.0  CL 104 105 104  CO2 32 29 28  CREATININE 1.39* 1.15* 1.19*   LFT No results found for this basename: PROT:3ALBUMIN:3,AST:3,ALT:3,ALKPHOS:3,BILITOT:3,BILIDIR:3,IBILI:3 in the last 72 hours PT/INR No results found for this basename: LABPROT:3,INR:3 in the last 72 hours PANCREAS No results found for this basename: LIPASE:3 in the last 72 hours       Studies/Results: No results found.  Medications: I have reviewed the patient's current medications.  Assessment/Plan: 1. Anemia. EGD negative. ? If needs colon. Have asked her to check her info at home and let us know when and where the colonoscopy was done. Issues regarding treatment of her MM. Will be available for colonoscopy if felt needed by Dr Myna Hidalgo and Dr Doristine Locks.   Jj Enyeart JR,Tierra Divelbiss L 06/12/2012, 9:28 AM

## 2012-06-13 LAB — BASIC METABOLIC PANEL
BUN: 14 mg/dL (ref 6–23)
Chloride: 104 mEq/L (ref 96–112)
Creatinine, Ser: 1.21 mg/dL — ABNORMAL HIGH (ref 0.50–1.10)
GFR calc Af Amer: 53 mL/min — ABNORMAL LOW (ref >90)
GFR calc non Af Amer: 46 mL/min — ABNORMAL LOW (ref >90)
Potassium: 3.7 mEq/L (ref 3.5–5.1)

## 2012-06-13 LAB — CBC
HCT: 33.2 % — ABNORMAL LOW (ref 36.0–46.0)
MCHC: 31.9 g/dL (ref 30.0–36.0)
MCV: 100 fL (ref 78.0–100.0)
Platelets: 195 10*3/uL (ref 150–400)
RDW: 21 % — ABNORMAL HIGH (ref 11.5–15.5)
WBC: 5.5 10*3/uL (ref 4.0–10.5)

## 2012-06-13 LAB — TYPE AND SCREEN: Unit division: 0

## 2012-06-13 MED ORDER — NON FORMULARY: 20.0000 mg | Freq: Every morning | Status: DC

## 2012-06-13 MED ORDER — ESOMEPRAZOLE MAGNESIUM 20 MG PO CPDR
20.0000 mg | DELAYED_RELEASE_CAPSULE | Freq: Every day | ORAL | Status: DC
  Administered 2012-06-13: 20 mg via ORAL
  Filled 2012-06-13 (×2): qty 1

## 2012-06-13 MED ORDER — FERUMOXYTOL INJECTION 510 MG/17 ML
510.0000 mg | Freq: Once | INTRAVENOUS | Status: AC
  Administered 2012-06-13: 510 mg via INTRAVENOUS
  Filled 2012-06-13: qty 17

## 2012-06-13 NOTE — Progress Notes (Signed)
[name] is in great spirits this morning. She feels well. She tolerated her blood nicely. Her hemoglobin is 10.6.  Her echocardiogram showed a ejection fraction of 55-60%. She did do some physical therapy.  Her appetite is good.  I do think that she would benefit from some iron. I think with the findings on the endoscopy, that she may have some marginal iron stores. I don't think it would hurt to give her iron.  I think we will be ready to get her home tomorrow.  I do think we will be able to treat the myeloma in a week or so. She and her family will be going to the beach this weekend for labor day. I want her to enjoy this.  On physical exam, vital signs are stable. There is no oral lesions. She has no thrush. There is no adenopathy in her neck. She has good breath sounds bilaterally. Cardiac exam regular rate and rhythm with no murmurs rubs or bruits. Abdominal exam soft good bowel sounds. There is no fluid wave. She is no palpable hepatospleno megaly. Extremities shows some trace edema.  We will give her iron today. We will then plan for discharge in the morning. I believe she will be ready for discharge. She's improved quite nicely since admission.  Pete E.

## 2012-06-13 NOTE — Progress Notes (Signed)
Patient requesting restoril as she takes at home. Takes 30 mg capsule at home and been on it for few months. Dr. Arline Asp made aware orders received.

## 2012-06-13 NOTE — Progress Notes (Signed)
Physical Therapy Treatment Patient Details Name: [name] MRN: [id number] DOB: Jun 11, 1946 Today's Date: 06/13/2012 Time: 4098-1191 PT Time Calculation (min): 15 min  PT Assessment / Plan / Recommendation Comments on Treatment Session  pt has improved.  Ready to d/c home with family support and HHPT to continue strengthening    Follow Up Recommendations  Supervision - Intermittent;Home health PT    Barriers to Discharge        Equipment Recommendations  None recommended by PT    Recommendations for Other Services    Frequency     Plan Discharge plan needs to be updated;Discharge plan remains appropriate    Precautions / Restrictions     Pertinent Vitals/Pain Pt c/o back pain when walking    Mobility  Bed Mobility Details for Bed Mobility Assistance: increased time Transfers Transfers: Sit to Stand;Stand to Sit Sit to Stand: From bed;6: Modified independent (Device/Increase time);Other (comment) (sitting on edge of bed) Stand to Sit: 5: Supervision;To chair/3-in-1;6: Modified independent (Device/Increase time);To bed Ambulation/Gait Ambulation/Gait Assistance: 6: Modified independent (Device/Increase time) Ambulation Distance (Feet): 300 Feet Assistive device: 4-wheeled walker Ambulation/Gait Assistance Details: pt continues to lean on her forearms du to back pain Gait Pattern: Step-through pattern;Decreased stride length;Trunk flexed Gait velocity: decreased General Gait Details: pt likely at baseline for ambulation Stairs: No Wheelchair Mobility Wheelchair Mobility: No    Exercises     PT Diagnosis:    PT Problem List:   PT Treatment Interventions:     PT Goals Acute Rehab PT Goals PT Goal: Sit to Stand - Progress: Met PT Goal: Ambulate - Progress: Met  Visit Information  Last PT Received On: 06/13/12 Assistance Needed: +1    Subjective Data  Subjective: I'm going to the beach Patient Stated Goal: to go home tomorrow   Cognition  Overall  Cognitive Status: Appears within functional limits for tasks assessed/performed Arousal/Alertness: Awake/alert Orientation Level: Appears intact for tasks assessed Behavior During Session: [facility] for tasks performed    Balance  Balance Balance Assessed: No  End of Session PT - End of Session Activity Tolerance: Patient limited by pain Patient left: with call bell/phone within reach;in bed   GP     Yetta Barre, PT [phone] 06/13/2012, 4:29 PM

## 2012-06-14 LAB — BASIC METABOLIC PANEL
Chloride: 101 mEq/L (ref 96–112)
GFR calc Af Amer: 47 mL/min — ABNORMAL LOW (ref >90)
GFR calc non Af Amer: 41 mL/min — ABNORMAL LOW (ref >90)
Potassium: 3.5 mEq/L (ref 3.5–5.1)
Sodium: 139 mEq/L (ref 135–145)

## 2012-06-14 LAB — CBC
HCT: 32.3 % — ABNORMAL LOW (ref 36.0–46.0)
MCHC: 32.2 g/dL (ref 30.0–36.0)
RDW: 20.2 % — ABNORMAL HIGH (ref 11.5–15.5)
WBC: 5 10*3/uL (ref 4.0–10.5)

## 2012-06-14 MED ORDER — HEPARIN SOD (PORK) LOCK FLUSH 100 UNIT/ML IV SOLN
500.0000 [IU] | Freq: Once | INTRAVENOUS | Status: AC
  Administered 2012-06-14: 500 [IU] via INTRAVENOUS
  Filled 2012-06-14: qty 5

## 2012-06-14 MED ORDER — CYCLOBENZAPRINE HCL 5 MG PO TABS: 5.0000 mg | ORAL_TABLET | Freq: Three times a day (TID) | ORAL | Status: DC | PRN

## 2012-06-14 MED ORDER — ALBUTEROL SULFATE HFA 108 (90 BASE) MCG/ACT IN AERS: 1.0000 | INHALATION_SPRAY | Freq: Three times a day (TID) | RESPIRATORY_TRACT | Status: DC

## 2012-06-14 MED ORDER — FLUCONAZOLE 100 MG PO TABS: 100.0000 mg | ORAL_TABLET | Freq: Every day | ORAL | Status: AC

## 2012-06-14 NOTE — Discharge Planning (Signed)
Physician Discharge Summary  Patient ID: [name] MRN: [id number] DOB/AGE: [id number] y.o.  Admit date: 06/05/2012 Discharge date: 06/14/2012  Discharge Diagnoses:   1. Fever of unclear etiology, resolved.  2. 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  3.. Hypothyroidism.  4.. History of asthma.  5   Anemia, improved  Meds on Discharge: Medication List  As of 06/14/2012 12:53 PM   STOP taking these medications         dexamethasone 4 MG tablet      diazepam 5 MG tablet      diphenoxylate-atropine 2.5-0.025 MG per tablet      hydrOXYzine 25 MG tablet      LORazepam 1 MG tablet      ondansetron 8 MG tablet      POMALYST 3 MG capsule      sulfamethoxazole-trimethoprim 800-160 MG per tablet      temazepam 15 MG capsule      torsemide 20 MG tablet      traMADol 50 MG tablet      traZODone 50 MG tablet         TAKE these medications         acyclovir 400 MG tablet   Commonly known as: ZOVIRAX   Take 800 mg by mouth 2 (two) times daily.      albuterol 108 (90 BASE) MCG/ACT inhaler   Commonly known as: PROVENTIL HFA;VENTOLIN HFA   Inhale 1-2 puffs into the lungs 3 (three) times daily.      ALPRAZolam 0.25 MG tablet   Commonly known as: XANAX   Take 0.25 mg by mouth at bedtime as needed. Sleep.      aspirin EC 81 MG tablet   Take 81 mg by mouth at bedtime.      benzonatate 100 MG capsule   Commonly known as: TESSALON   Take 100 mg by mouth daily as needed. Cough.      celecoxib 100 MG capsule   Commonly known as: CELEBREX   Take 100 mg by mouth 2 (two) times daily.      cyclobenzaprine 5 MG tablet   Commonly known as: FLEXERIL   Take 1 tablet (5 mg total) by mouth 3 (three) times daily as needed for muscle spasms.      DULoxetine 30 MG capsule   Commonly known as: CYMBALTA   Take 30 mg by mouth daily.      esomeprazole 40 MG capsule   Commonly known as: NEXIUM   Take 40 mg by mouth daily before breakfast.     fluconazole 100 MG tablet   Commonly known as: DIFLUCAN   Take 1 tablet (100 mg total) by mouth daily.      Fluticasone-Salmeterol 250-50 MCG/DOSE Aepb   Commonly known as: ADVAIR   Inhale 1 puff into the lungs every 12 (twelve) hours.      HYDROcodone-acetaminophen 5-325 MG per tablet   Commonly known as: NORCO/VICODIN   Take 1-2 tablets by mouth every 8 (eight) hours as needed. Pain.      HYDROcodone-homatropine 5-1.5 MG/5ML syrup   Commonly known as: HYCODAN   Take 5 mLs by mouth every 6 (six) hours as needed. Cough.      levothyroxine 50 MCG tablet   Commonly known as: SYNTHROID, LEVOTHROID   Take 50 mcg by mouth at bedtime.      magic mouthwash Soln   Take 5 mLs by mouth 4 (four) times daily as needed. [employer]'s formula--no Simethicone.  olopatadine 0.1 % ophthalmic solution   Commonly known as: PATANOL   Place 1 drop into both eyes 2 (two) times daily.      pirbuterol 200 MCG/INH inhaler   Commonly known as: MAXAIR   Inhale 1-2 puffs into the lungs every 12 (twelve) hours as needed. Wheezing/shortness of breath.  Use sparingly.      temazepam 30 MG capsule   Commonly known as: RESTORIL   Take 30 mg by mouth at bedtime as needed. Sleep.      tolterodine 4 MG 24 hr capsule   Commonly known as: DETROL LA   Take 4 mg by mouth daily.      Vitamin D (Ergocalciferol) 50000 UNITS Caps   Commonly known as: DRISDOL   Take 50,000 Units by mouth every 7 (seven) days. Take on Monday            Labs:  CBC    Component Value Date/Time   WBC 5.0 06/14/2012 0500   WBC 7.4 06/05/2012 1338   WBC 6.3 06/17/2008 1346   RBC 3.24* 06/14/2012 0500   RBC 3.94 06/17/2008 1346   HGB 10.4* 06/14/2012 0500   HGB 6.8* 06/05/2012 1338   HGB 11.8 06/17/2008 1346   HCT 32.3* 06/14/2012 0500   HCT 21.9* 06/05/2012 1338   HCT 36.2 06/17/2008 1346   PLT 199 06/14/2012 0500   PLT 164 06/05/2012 1338   PLT 278 06/17/2008 1346   MCV 99.7 06/14/2012 0500   MCV 115* 06/05/2012 1338   MCV 91.9  06/17/2008 1346   MCH 32.1 06/14/2012 0500   MCH 35.6* 06/05/2012 1338   MCH 30.0 06/17/2008 1346   MCHC 32.2 06/14/2012 0500   MCHC 31.1* 06/05/2012 1338   MCHC 32.6 06/17/2008 1346   RDW 20.2* 06/14/2012 0500   RDW 17.7* 06/05/2012 1338   RDW 16.3* 06/17/2008 1346   LYMPHSABS 0.4* 06/06/2012 0500   LYMPHSABS 0.5* 06/05/2012 1338   LYMPHSABS 1.4 06/17/2008 1346   MONOABS 0.2 06/06/2012 0500   MONOABS 0.5 06/17/2008 1346   EOSABS 0.1 06/06/2012 0500   EOSABS 0.2 06/05/2012 1338   EOSABS 0.1 06/17/2008 1346   BASOSABS 0.0 06/06/2012 0500   BASOSABS 0.0 06/05/2012 1338   BASOSABS 0.0 06/17/2008 1346   CMP     Component Value Date/Time   NA 139 06/14/2012 0500   NA 143 03/22/2012 1208   K 3.5 06/14/2012 0500   K 4.5 03/22/2012 1208   CL 101 06/14/2012 0500   CL 103 03/22/2012 1208   CO2 30 06/14/2012 0500   CO2 29 03/22/2012 1208   GLUCOSE 110* 06/14/2012 0500   GLUCOSE 185* 03/22/2012 1208   BUN 14 06/14/2012 0500   BUN 17 03/22/2012 1208   CREATININE 1.33* 06/14/2012 0500   CREATININE 1.4* 03/22/2012 1208   CALCIUM 9.7 06/14/2012 0500   CALCIUM 9.6 03/22/2012 1208   PROT 5.5* 06/06/2012 0500   PROT 6.4 03/22/2012 1208   ALBUMIN 2.7* 06/06/2012 0500   AST 18 06/06/2012 0500   AST 15 03/22/2012 1208   ALT 16 06/06/2012 0500   ALKPHOS 45 06/06/2012 0500   ALKPHOS 43 03/22/2012 1208   BILITOT 0.7 06/06/2012 0500   BILITOT 0.60 03/22/2012 1208   GFRNONAA 41* 06/14/2012 0500   GFRAA 47* 06/14/2012 0500      Procedures: s/p upper endoscopy Dr. Dulce Sellar, 06/11/2012                        S/p Bone  Marrow biopsy, IR, Dr. Deanne Coffer, 06/06/2012                        S/p transfusion of 2 units of PRBCs 06/06/2012  Consultations:  Dr. Dulce Sellar, GI, 06/10/2012                             Interventional radiology 06/06/2012   Condition on Discharge: improved  Discharge Orders    Future Appointments: Provider: Department: Dept Phone: Center:   06/20/2012 2:00 PM Lorrene Reid, PT [facility] [phone] [facility]   06/22/2012 2:00 PM  Elke H Naumann-Houegnifio, PT [facility] [phone] [facility]     Future Orders Please Complete By Expires   De-access Port A Cath      Discharge patient         Follow-up Information    Follow up with Josph Macho, MD. (I will set up appt and chemo for when you get back from your vacation.)    Contact information:   [address] [place] [address] [phone]          HPI/ Hospital Course: Mrs. Westergren is a 66 year old white female with a history of  kappa light chain myeloma,  on pomalidomide and Decadron. Patient  Was in her usual state of health until recently. She presented for a  Follow up evaluation at the [facility] on 06/06/2012 where a drop of Hb from 7.2 hematocrit 21.9, platelet count 164. MCV was 115. Peripheral smear shows some anisocytosis and poikilocytosis.no  obvious nucleated red cells.some microcytic red cells, spherocytes or target cells were seen. She had some rouleaux formation.White cells appeared normal in morphological maturation without immature myeloid cells. There may be an increase in lymphs and monocytes. There were no hypersegmented polys or  obvious blasts. Platelets were adequate in number and size. 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 Her echocardiogram showed a ejection fraction of 55-60%   She was weak, symptomatic,had non productive cough and  low-grade temperature. Her hemoglobin was down at 6.8. White count of 7.4.She was admitted for management of symptoms and observation. She received 2 units of blood with good response. A bone marrow biopsy was scheduled  to assess for myeloma progression with a possibility of transformation to a myelodysplastic process. In addition, Iron IV was given on 8/27 to improve her stores and symptoms. A chest x-ray was negative. 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  Her echocardiogram showed a ejection fraction of 55-60%  A new bone marrow biopsy was performed by IR on 06/06/2012 .The plasma cells are increased in the bone marrow representing 25% of all cells associated with atypical cytologic features. Immunohistochemical stains highlight the increased plasma cell component present in the marrow which shows kappa light chain restriction consistent with plasma cell neoplasm. Immunohistochemical stain for CD138 was performed on clot and biopsy along with kappa and lambda stains performed on core biopsy. CD138 highlights the increased plasma cell component present in the marrow.Kappa and lambda stains show kappa light chain restriction in plasma cells.  In addition, patient received GI evaluation by Dr. Dulce Sellar on 8/24, who performed an upper endoscopy on 8/25, yielding negative results. No colonoscopy  was performed for a procedure was done about one or two years prior, negative findings.   Today, the patients reports feeling better, clinically stable. Her vital signs are adequate. No new issues are noted this morning. She is to be discharged in stable condition with home health PT.     SignedMarlowe Kays E 06/14/2012, 12:53 PM

## 2012-06-14 NOTE — Progress Notes (Signed)
Pt discharged home via family; Pt and family given and explained all discharge instructions, carenotes, and prescriptions; pt and family stated understanding and denied questions/concerns; all f/u appointments in place; IV removed without complicaitons; pt stable at time of discharge  

## 2012-06-15 ENCOUNTER — Encounter: Payer: Medicare Other | Admitting: Physical Therapy

## 2012-06-15 ENCOUNTER — Other Ambulatory Visit: Payer: Self-pay | Admitting: *Deleted

## 2012-06-15 DIAGNOSIS — C9000 Multiple myeloma not having achieved remission: Secondary | ICD-10-CM

## 2012-06-15 DIAGNOSIS — R2681 Unsteadiness on feet: Secondary | ICD-10-CM

## 2012-06-20 ENCOUNTER — Ambulatory Visit: Payer: Medicare Other

## 2012-06-22 ENCOUNTER — Other Ambulatory Visit: Payer: Self-pay | Admitting: *Deleted

## 2012-06-22 ENCOUNTER — Encounter: Payer: Medicare Other | Admitting: Physical Therapy

## 2012-06-22 NOTE — Telephone Encounter (Signed)
Erroneous encounter

## 2012-06-23 ENCOUNTER — Other Ambulatory Visit: Payer: Self-pay | Admitting: Hematology & Oncology

## 2012-06-23 ENCOUNTER — Other Ambulatory Visit: Payer: Self-pay | Admitting: *Deleted

## 2012-06-23 DIAGNOSIS — C9000 Multiple myeloma not having achieved remission: Secondary | ICD-10-CM

## 2012-06-23 MED ORDER — TEMAZEPAM 30 MG PO CAPS: 30.0000 mg | ORAL_CAPSULE | Freq: Every evening | ORAL | Status: DC | PRN

## 2012-06-24 ENCOUNTER — Other Ambulatory Visit: Payer: Self-pay | Admitting: Hematology & Oncology

## 2012-06-24 DIAGNOSIS — C9000 Multiple myeloma not having achieved remission: Secondary | ICD-10-CM

## 2012-06-29 ENCOUNTER — Ambulatory Visit: Payer: Medicare Other

## 2012-06-29 ENCOUNTER — Other Ambulatory Visit: Payer: Medicare Other | Admitting: Lab

## 2012-06-29 ENCOUNTER — Other Ambulatory Visit (HOSPITAL_BASED_OUTPATIENT_CLINIC_OR_DEPARTMENT_OTHER): Payer: Medicare Other | Admitting: Lab

## 2012-06-29 ENCOUNTER — Ambulatory Visit (HOSPITAL_BASED_OUTPATIENT_CLINIC_OR_DEPARTMENT_OTHER): Payer: Medicare Other

## 2012-06-29 ENCOUNTER — Other Ambulatory Visit: Payer: Self-pay | Admitting: *Deleted

## 2012-06-29 VITALS — BP 124/66 | HR 85 | Temp 98.2°F | Resp 20

## 2012-06-29 DIAGNOSIS — Z5112 Encounter for antineoplastic immunotherapy: Secondary | ICD-10-CM

## 2012-06-29 DIAGNOSIS — M2550 Pain in unspecified joint: Secondary | ICD-10-CM

## 2012-06-29 DIAGNOSIS — F419 Anxiety disorder, unspecified: Secondary | ICD-10-CM

## 2012-06-29 DIAGNOSIS — C9000 Multiple myeloma not having achieved remission: Secondary | ICD-10-CM

## 2012-06-29 DIAGNOSIS — Z5111 Encounter for antineoplastic chemotherapy: Secondary | ICD-10-CM

## 2012-06-29 DIAGNOSIS — R197 Diarrhea, unspecified: Secondary | ICD-10-CM

## 2012-06-29 DIAGNOSIS — Z452 Encounter for adjustment and management of vascular access device: Secondary | ICD-10-CM

## 2012-06-29 DIAGNOSIS — D649 Anemia, unspecified: Secondary | ICD-10-CM

## 2012-06-29 LAB — CBC WITH DIFFERENTIAL (CANCER CENTER ONLY)
BASO#: 0 10*3/uL (ref 0.0–0.2)
EOS%: 8 % — ABNORMAL HIGH (ref 0.0–7.0)
Eosinophils Absolute: 0.4 10*3/uL (ref 0.0–0.5)
HCT: 35.4 % (ref 34.8–46.6)
HGB: 11.4 g/dL — ABNORMAL LOW (ref 11.6–15.9)
LYMPH#: 1.1 10*3/uL (ref 0.9–3.3)
MCHC: 32.2 g/dL (ref 32.0–36.0)
MONO#: 0.6 10*3/uL (ref 0.1–0.9)
NEUT#: 2.8 10*3/uL (ref 1.5–6.5)
NEUT%: 56.4 % (ref 39.6–80.0)
RBC: 3.48 10*6/uL — ABNORMAL LOW (ref 3.70–5.32)
WBC: 5 10*3/uL (ref 3.9–10.0)

## 2012-06-29 LAB — CMP (CANCER CENTER ONLY)
ALT(SGPT): 25 U/L (ref 10–47)
Alkaline Phosphatase: 47 U/L (ref 26–84)
CO2: 28 mEq/L (ref 18–33)
Creat: 2.1 mg/dl — ABNORMAL HIGH (ref 0.6–1.2)
Total Bilirubin: 0.6 mg/dl (ref 0.20–1.60)

## 2012-06-29 MED ORDER — ONDANSETRON 8 MG/50ML IVPB (CHCC)
8.0000 mg | Freq: Once | INTRAVENOUS | Status: AC
  Administered 2012-06-29: 8 mg via INTRAVENOUS

## 2012-06-29 MED ORDER — HEPARIN SOD (PORK) LOCK FLUSH 100 UNIT/ML IV SOLN
500.0000 [IU] | Freq: Once | INTRAVENOUS | Status: AC
  Administered 2012-06-29: 500 [IU] via INTRAVENOUS
  Filled 2012-06-29: qty 5

## 2012-06-29 MED ORDER — HEPARIN SOD (PORK) LOCK FLUSH 100 UNIT/ML IV SOLN
500.0000 [IU] | Freq: Once | INTRAVENOUS | Status: DC | PRN
  Filled 2012-06-29: qty 5

## 2012-06-29 MED ORDER — CELECOXIB 100 MG PO CAPS: 100.0000 mg | ORAL_CAPSULE | Freq: Two times a day (BID) | ORAL | Status: DC

## 2012-06-29 MED ORDER — ALTEPLASE 2 MG IJ SOLR
2.0000 mg | Freq: Once | INTRAMUSCULAR | Status: AC | PRN
  Administered 2012-06-29: 2 mg
  Filled 2012-06-29: qty 2

## 2012-06-29 MED ORDER — SODIUM CHLORIDE 0.9 % IJ SOLN
10.0000 mL | INTRAMUSCULAR | Status: DC | PRN
  Filled 2012-06-29: qty 10

## 2012-06-29 MED ORDER — DEXAMETHASONE SODIUM PHOSPHATE 10 MG/ML IJ SOLN
10.0000 mg | Freq: Once | INTRAMUSCULAR | Status: AC
  Administered 2012-06-29: 10 mg via INTRAVENOUS

## 2012-06-29 MED ORDER — DIPHENOXYLATE-ATROPINE 2.5-0.025 MG PO TABS: 1.0000 | ORAL_TABLET | Freq: Four times a day (QID) | ORAL | Status: DC | PRN

## 2012-06-29 MED ORDER — SODIUM CHLORIDE 0.9 % IJ SOLN
10.0000 mL | INTRAMUSCULAR | Status: DC | PRN
  Administered 2012-06-29: 10 mL via INTRAVENOUS
  Filled 2012-06-29: qty 10

## 2012-06-29 MED ORDER — ALPRAZOLAM 0.25 MG PO TABS: 0.2500 mg | ORAL_TABLET | Freq: Three times a day (TID) | ORAL | Status: DC | PRN

## 2012-06-29 MED ORDER — BORTEZOMIB CHEMO SQ INJECTION 3.5 MG (2.5MG/ML)
1.3000 mg/m2 | INTRAMUSCULAR | Status: DC
  Administered 2012-06-29: 2 mg via SUBCUTANEOUS
  Filled 2012-06-29: qty 2

## 2012-06-29 MED ORDER — SODIUM CHLORIDE 0.9 % IV SOLN
Freq: Once | INTRAVENOUS | Status: AC
  Administered 2012-06-29: 11:00:00 via INTRAVENOUS

## 2012-06-29 MED ORDER — SODIUM CHLORIDE 0.9 % IV SOLN
60.0000 mg/m2 | Freq: Once | INTRAVENOUS | Status: AC
  Administered 2012-06-29: 100 mg via INTRAVENOUS
  Filled 2012-06-29: qty 20

## 2012-06-29 NOTE — Progress Notes (Signed)
This office note has been dictated.

## 2012-06-29 NOTE — Addendum Note (Signed)
Addended by: Josph Macho on: 06/29/2012 06:43 PM   Modules accepted: Level of Service

## 2012-06-29 NOTE — Patient Instructions (Signed)
Bendamustine Injection What is this medicine? BENDAMUSTINE (BEN da MUS teen) is an anti-cancer drug used to treat a certain type of leukemia. This medicine may be used for other purposes; ask your health care provider or pharmacist if you have questions. What should I tell my health care provider before I take this medicine? They need to know if you have any of these conditions: -kidney disease -liver disease -an unusual or allergic reaction to bendamustine, mannitol, other medicines, foods, dyes, or preservatives -pregnant or trying to get pregnant -breast-feeding How should I use this medicine? This medicine is for infusion into a vein. It is given by a health care professional in a hospital or clinic setting. Talk to your pediatrician regarding the use of this medicine in children. Special care may be needed. Overdosage: If you think you have taken too much of this medicine contact a poison control center or emergency room at once. NOTE: This medicine is only for you. Do not share this medicine with others. What if I miss a dose? It is important not to miss your dose. Call your doctor or health care professional if you are unable to keep an appointment. What may interact with this medicine? Do not take this medicine with any of the following medications: -clozapine This medicine may also interact with the following medications: -atazanavir -cimetidine -ciprofloxacin -enoxacin -fluvoxamine -medicines for seizures like carbamazepine and phenobarbital -mexiletine -rifampin -tacrine -thiabendazole -zileuton This list may not describe all possible interactions. Give your health care provider a list of all the medicines, herbs, non-prescription drugs, or dietary supplements you use. Also tell them if you smoke, drink alcohol, or use illegal drugs. Some items may interact with your medicine. What should I watch for while using this medicine? Your condition will be monitored carefully  while you are receiving this medicine. This drug may make you feel generally unwell. This is not uncommon, as chemotherapy can affect healthy cells as well as cancer cells. Report any side effects. Continue your course of treatment even though you feel ill unless your doctor tells you to stop. Call your doctor or health care professional for advice if you get a fever, chills or sore throat, or other symptoms of a cold or flu. Do not treat yourself. This drug decreases your body's ability to fight infections. Try to avoid being around people who are sick. This medicine may increase your risk to bruise or bleed. Call your doctor or health care professional if you notice any unusual bleeding. Be careful brushing and flossing your teeth or using a toothpick because you may get an infection or bleed more easily. If you have any dental work done, tell your dentist you are receiving this medicine. Avoid taking products that contain aspirin, acetaminophen, ibuprofen, naproxen, or ketoprofen unless instructed by your doctor. These medicines may hide a fever. Do not become pregnant while taking this medicine. Women should inform their doctor if they wish to become pregnant or think they might be pregnant. There is a potential for serious side effects to an unborn child. Men should inform their doctors if they wish to father a child. This medicine may lower sperm counts. Talk to your health care professional or pharmacist for more information. Do not breast-feed an infant while taking this medicine. What side effects may I notice from receiving this medicine? Side effects that you should report to your doctor or health care professional as soon as possible: -allergic reactions like skin rash, itching or hives, swelling of the face,   lips, or tongue -low blood counts - this medicine may decrease the number of white blood cells, red blood cells and platelets. You may be at increased risk for infections and  bleeding. -signs of infection - fever or chills, cough, sore throat, pain or difficulty passing urine -signs of decreased platelets or bleeding - bruising, pinpoint red spots on the skin, black, tarry stools, blood in the urine -signs of decreased red blood cells - unusually weak or tired, fainting spells, lightheadedness -trouble passing urine or change in the amount of urine Side effects that usually do not require medical attention (report to your doctor or health care professional if they continue or are bothersome): -diarrhea This list may not describe all possible side effects. Call your doctor for medical advice about side effects. You may report side effects to FDA at 1-800-FDA-1088. Where should I keep my medicine? This drug is given in a hospital or clinic and will not be stored at home. NOTE: This sheet is a summary. It may not cover all possible information. If you have questions about this medicine, talk to your doctor, pharmacist, or health care provider.  2012, [employer]vier/Gold Standard. (01/08/2008 3:58:27 PM)   Bortezomib injection What is this medicine? BORTEZOMIB (bor TEZ oh mib) is a chemotherapy drug. It slows the growth of cancer cells. This medicine is used to treat multiple myeloma, lymphoma, and other cancers. This medicine may be used for other purposes; ask your health care provider or pharmacist if you have questions. What should I tell my health care provider before I take this medicine? They need to know if you have any of these conditions: -heart disease -irregular heartbeat -liver disease -low blood counts, like low white blood cells, platelets, or hemoglobin -peripheral neuropathy -taking medicine for blood pressure -an unusual or allergic reaction to bortezomib, mannitol, boron, other medicines, foods, dyes, or preservatives -pregnant or trying to get pregnant -breast-feeding How should I use this medicine? This medicine is for injection into a vein or for  injection under the skin. It is given by a health care professional in a hospital or clinic setting. Talk to your pediatrician regarding the use of this medicine in children. Special care may be needed. Overdosage: If you think you have taken too much of this medicine contact a poison control center or emergency room at once. NOTE: This medicine is only for you. Do not share this medicine with others. What if I miss a dose? It is important not to miss your dose. Call your doctor or health care professional if you are unable to keep an appointment. What may interact with this medicine? -medicines for diabetes -medicines to increase blood counts like filgrastim, pegfilgrastim, sargramostim -zalcitabine Talk to your doctor or health care professional before taking any of these medicines: -acetaminophen -aspirin -ibuprofen -ketoprofen -naproxen This list may not describe all possible interactions. Give your health care provider a list of all the medicines, herbs, non-prescription drugs, or dietary supplements you use. Also tell them if you smoke, drink alcohol, or use illegal drugs. Some items may interact with your medicine. What should I watch for while using this medicine? Visit your doctor for checks on your progress. This drug may make you feel generally unwell. This is not uncommon, as chemotherapy can affect healthy cells as well as cancer cells. Report any side effects. Continue your course of treatment even though you feel ill unless your doctor tells you to stop. You may get drowsy or dizzy. Do not drive, use machinery,   or do anything that needs mental alertness until you know how this medicine affects you. Do not stand or sit up quickly, especially if you are an older patient. This reduces the risk of dizzy or fainting spells. In some cases, you may be given additional medicines to help with side effects. Follow all directions for their use. Call your doctor or health care professional  for advice if you get a fever, chills or sore throat, or other symptoms of a cold or flu. Do not treat yourself. This drug decreases your body's ability to fight infections. Try to avoid being around people who are sick. This medicine may increase your risk to bruise or bleed. Call your doctor or health care professional if you notice any unusual bleeding. Be careful brushing and flossing your teeth or using a toothpick because you may get an infection or bleed more easily. If you have any dental work done, tell your dentist you are receiving this medicine. Avoid taking products that contain aspirin, acetaminophen, ibuprofen, naproxen, or ketoprofen unless instructed by your doctor. These medicines may hide a fever. Do not become pregnant while taking this medicine. Women should inform their doctor if they wish to become pregnant or think they might be pregnant. There is a potential for serious side effects to an unborn child. Talk to your health care professional or pharmacist for more information. Do not breast-feed an infant while taking this medicine. You may have vomiting or diarrhea while taking this medicine. Drink water or other fluids as directed. What side effects may I notice from receiving this medicine? Side effects that you should report to your doctor or health care professional as soon as possible: -allergic reactions like skin rash, itching or hives, swelling of the face, lips, or tongue -breathing problems -changes in hearing -changes in vision -fast, irregular heartbeat -feeling faint or lightheaded, falls -pain, tingling, numbness in the hands or feet -seizures -swelling of the ankles, feet, hands -unusual bleeding or bruising -unusually weak or tired -vomiting Side effects that usually do not require medical attention (report to your doctor or health care professional if they continue or are bothersome): -changes in emotions or moods -constipation -diarrhea -loss of  appetite -headache -irritation at site where injected -nausea This list may not describe all possible side effects. Call your doctor for medical advice about side effects. You may report side effects to FDA at 1-800-FDA-1088. Where should I keep my medicine? This drug is given in a hospital or clinic and will not be stored at home. NOTE: This sheet is a summary. It may not cover all possible information. If you have questions about this medicine, talk to your doctor, pharmacist, or health care provider.  2012, Elsevier/Gold Standard. (11/11/2010 11:42:36 AM) 

## 2012-06-30 ENCOUNTER — Ambulatory Visit (HOSPITAL_BASED_OUTPATIENT_CLINIC_OR_DEPARTMENT_OTHER): Payer: Medicare Other

## 2012-06-30 VITALS — BP 131/66 | HR 90 | Temp 97.4°F | Resp 18

## 2012-06-30 DIAGNOSIS — C9000 Multiple myeloma not having achieved remission: Secondary | ICD-10-CM

## 2012-06-30 DIAGNOSIS — Z5111 Encounter for antineoplastic chemotherapy: Secondary | ICD-10-CM

## 2012-06-30 LAB — IGG, IGA, IGM
IgA: <6 mg/dL — ABNORMAL LOW (ref 69–380)
IgM, Serum: <5 mg/dL — ABNORMAL LOW (ref 52–322)

## 2012-06-30 LAB — KAPPA/LAMBDA LIGHT CHAINS: Kappa free light chain: 1.73 mg/dL (ref 0.33–1.94)

## 2012-06-30 MED ORDER — SODIUM CHLORIDE 0.9 % IV SOLN
60.0000 mg/m2 | Freq: Once | INTRAVENOUS | Status: AC
  Administered 2012-06-30: 100 mg via INTRAVENOUS
  Filled 2012-06-30: qty 20

## 2012-06-30 MED ORDER — ACETAMINOPHEN 325 MG PO TABS
650.0000 mg | ORAL_TABLET | Freq: Once | ORAL | Status: AC
  Administered 2012-06-30: 650 mg via ORAL

## 2012-06-30 MED ORDER — SODIUM CHLORIDE 0.9 % IV SOLN
Freq: Once | INTRAVENOUS | Status: AC
  Administered 2012-06-30: 12:00:00 via INTRAVENOUS

## 2012-06-30 MED ORDER — HEPARIN SOD (PORK) LOCK FLUSH 100 UNIT/ML IV SOLN
500.0000 [IU] | Freq: Once | INTRAVENOUS | Status: AC | PRN
  Administered 2012-06-30: 500 [IU]
  Filled 2012-06-30: qty 5

## 2012-06-30 MED ORDER — ONDANSETRON 8 MG/50ML IVPB (CHCC)
8.0000 mg | Freq: Once | INTRAVENOUS | Status: AC
  Administered 2012-06-30: 8 mg via INTRAVENOUS

## 2012-06-30 MED ORDER — DEXAMETHASONE SODIUM PHOSPHATE 10 MG/ML IJ SOLN
10.0000 mg | Freq: Once | INTRAMUSCULAR | Status: AC
  Administered 2012-06-30: 10 mg via INTRAVENOUS

## 2012-06-30 MED ORDER — SODIUM CHLORIDE 0.9 % IJ SOLN
10.0000 mL | INTRAMUSCULAR | Status: DC | PRN
  Administered 2012-06-30: 10 mL
  Filled 2012-06-30: qty 10

## 2012-06-30 NOTE — Progress Notes (Signed)
DIAGNOSIS:  Recurrent kappa light chain myeloma.  CURRENT THERAPY: 1. The patient to start salvage therapy with     bendamustine/Velcade/Decadron. 2. Aranesp 300 mcg subcu as needed for hemoglobin less than 10. 3. Zometa 4 mg IV q.3-4 weeks. 4. IVIG q.4 weeks.  INTERVAL HISTORY:  [name] comes in for her treatment.  She was hospitalized back in August.  She had marked anemia.  She did have some GI bleeding.  She underwent endoscopy.  We did give her iron in the hospital.  She also got  a dose of Aranesp because of some renal insufficiency.  She is looking a whole lot better now.  She had a good nice vacation down at the beach in [place] with her family.  She is very happy that she could go.  She had been through most, if not all, standard treatment for her myeloma.  She had been on pomalidomide.  She had a transient response to this.  She had a bone marrow test done while she was in the hospital.  Bone marrow biopsy showed about 25% plasma cells.  Again, she is feeling better.  She is eating better.  She is not having dyspnea. She has chronic lung problems.  PHYSICAL EXAMINATION:  Vital signs are relatively stable.  Blood pressure 124/66, temperature 98.2, pulse is 85.  Lungs:  Relatively clear.  She may have some respiratory wheezes.  Cardiac:  Regular rate and rhythm with a normal S1, S2.  Head and neck:  No ocular or oral lesions.  There is no adenopathy in the neck.  There is no mucositis. Abdomen:  Soft.  She has good bowel sounds.  There is no fluid wave. There is no palpable hepatosplenomegaly.  Extremities:  Shows the firmness in her thighs because of past reaction to radiation.  LABORATORY STUDIES:  White cell count 5, hemoglobin 11.4, hematocrit 35.4, platelet count 173.  Her BUN is 18, creatinine 2.1.  Total protein 6.6 with an albumin of 3.6.  IMPRESSION:  [name] is a 66 year old female with recurrent kappa light chain myeloma.  Hopefully,  we will be able to get a response with chemo.  She has not had this protocol.  Hopefully, we will see that she is responding.  I will not sure why she has the increase in creatinine.  I suppose it might be from her underlying myeloma itself.  Will hold the Zometa for now.  She does do well with IVIG.  This does help her immune system.  I am just glad to see that her quality of life is better.  Her performance status certainly is better (ECOG 1).  We will see her back as scheduled.    ______________________________ Josph Macho, M.D. PRE/MEDQ  D:  06/29/2012  T:  06/30/2012  Job:  [id number]

## 2012-06-30 NOTE — Patient Instructions (Signed)
Bendamustine Injection What is this medicine? BENDAMUSTINE (BEN da MUS teen) is an anti-cancer drug used to treat a certain type of leukemia. This medicine may be used for other purposes; ask your health care provider or pharmacist if you have questions. What should I tell my health care provider before I take this medicine? They need to know if you have any of these conditions: -kidney disease -liver disease -an unusual or allergic reaction to bendamustine, mannitol, other medicines, foods, dyes, or preservatives -pregnant or trying to get pregnant -breast-feeding How should I use this medicine? This medicine is for infusion into a vein. It is given by a health care professional in a hospital or clinic setting. Talk to your pediatrician regarding the use of this medicine in children. Special care may be needed. Overdosage: If you think you have taken too much of this medicine contact a poison control center or emergency room at once. NOTE: This medicine is only for you. Do not share this medicine with others. What if I miss a dose? It is important not to miss your dose. Call your doctor or health care professional if you are unable to keep an appointment. What may interact with this medicine? Do not take this medicine with any of the following medications: -clozapine This medicine may also interact with the following medications: -atazanavir -cimetidine -ciprofloxacin -enoxacin -fluvoxamine -medicines for seizures like carbamazepine and phenobarbital -mexiletine -rifampin -tacrine -thiabendazole -zileuton This list may not describe all possible interactions. Give your health care provider a list of all the medicines, herbs, non-prescription drugs, or dietary supplements you use. Also tell them if you smoke, drink alcohol, or use illegal drugs. Some items may interact with your medicine. What should I watch for while using this medicine? Your condition will be monitored carefully  while you are receiving this medicine. This drug may make you feel generally unwell. This is not uncommon, as chemotherapy can affect healthy cells as well as cancer cells. Report any side effects. Continue your course of treatment even though you feel ill unless your doctor tells you to stop. Call your doctor or health care professional for advice if you get a fever, chills or sore throat, or other symptoms of a cold or flu. Do not treat yourself. This drug decreases your body's ability to fight infections. Try to avoid being around people who are sick. This medicine may increase your risk to bruise or bleed. Call your doctor or health care professional if you notice any unusual bleeding. Be careful brushing and flossing your teeth or using a toothpick because you may get an infection or bleed more easily. If you have any dental work done, tell your dentist you are receiving this medicine. Avoid taking products that contain aspirin, acetaminophen, ibuprofen, naproxen, or ketoprofen unless instructed by your doctor. These medicines may hide a fever. Do not become pregnant while taking this medicine. Women should inform their doctor if they wish to become pregnant or think they might be pregnant. There is a potential for serious side effects to an unborn child. Men should inform their doctors if they wish to father a child. This medicine may lower sperm counts. Talk to your health care professional or pharmacist for more information. Do not breast-feed an infant while taking this medicine. What side effects may I notice from receiving this medicine? Side effects that you should report to your doctor or health care professional as soon as possible: -allergic reactions like skin rash, itching or hives, swelling of the face,   lips, or tongue -low blood counts - this medicine may decrease the number of white blood cells, red blood cells and platelets. You may be at increased risk for infections and  bleeding. -signs of infection - fever or chills, cough, sore throat, pain or difficulty passing urine -signs of decreased platelets or bleeding - bruising, pinpoint red spots on the skin, black, tarry stools, blood in the urine -signs of decreased red blood cells - unusually weak or tired, fainting spells, lightheadedness -trouble passing urine or change in the amount of urine Side effects that usually do not require medical attention (report to your doctor or health care professional if they continue or are bothersome): -diarrhea This list may not describe all possible side effects. Call your doctor for medical advice about side effects. You may report side effects to FDA at 1-800-FDA-1088. Where should I keep my medicine? This drug is given in a hospital or clinic and will not be stored at home. NOTE: This sheet is a summary. It may not cover all possible information. If you have questions about this medicine, talk to your doctor, pharmacist, or health care provider.  2012, [employer]. (01/08/2008 3:58:27 PM) 

## 2012-07-03 ENCOUNTER — Ambulatory Visit: Payer: Medicare Other | Attending: Hematology & Oncology

## 2012-07-03 DIAGNOSIS — IMO0001 Reserved for inherently not codable concepts without codable children: Secondary | ICD-10-CM | POA: Insufficient documentation

## 2012-07-03 DIAGNOSIS — R262 Difficulty in walking, not elsewhere classified: Secondary | ICD-10-CM | POA: Insufficient documentation

## 2012-07-03 DIAGNOSIS — M2560 Stiffness of unspecified joint, not elsewhere classified: Secondary | ICD-10-CM | POA: Insufficient documentation

## 2012-07-03 DIAGNOSIS — M6281 Muscle weakness (generalized): Secondary | ICD-10-CM | POA: Insufficient documentation

## 2012-07-05 ENCOUNTER — Other Ambulatory Visit (HOSPITAL_BASED_OUTPATIENT_CLINIC_OR_DEPARTMENT_OTHER): Payer: Medicare Other | Admitting: Lab

## 2012-07-05 ENCOUNTER — Ambulatory Visit (HOSPITAL_BASED_OUTPATIENT_CLINIC_OR_DEPARTMENT_OTHER): Payer: Medicare Other

## 2012-07-05 VITALS — BP 109/64 | HR 90 | Temp 98.5°F | Resp 20

## 2012-07-05 DIAGNOSIS — D631 Anemia in chronic kidney disease: Secondary | ICD-10-CM

## 2012-07-05 DIAGNOSIS — C9000 Multiple myeloma not having achieved remission: Secondary | ICD-10-CM

## 2012-07-05 DIAGNOSIS — Z5112 Encounter for antineoplastic immunotherapy: Secondary | ICD-10-CM

## 2012-07-05 LAB — CBC WITH DIFFERENTIAL (CANCER CENTER ONLY)
BASO#: 0 10*3/uL (ref 0.0–0.2)
BASO%: 0.2 % (ref 0.0–2.0)
EOS%: 6.5 % (ref 0.0–7.0)
HCT: 33.6 % — ABNORMAL LOW (ref 34.8–46.6)
HGB: 10.9 g/dL — ABNORMAL LOW (ref 11.6–15.9)
LYMPH#: 0.3 10*3/uL — ABNORMAL LOW (ref 0.9–3.3)
MCHC: 32.4 g/dL (ref 32.0–36.0)
MONO#: 0.5 10*3/uL (ref 0.1–0.9)
NEUT#: 3.6 10*3/uL (ref 1.5–6.5)
NEUT%: 76.3 % (ref 39.6–80.0)
WBC: 4.8 10*3/uL (ref 3.9–10.0)

## 2012-07-05 LAB — BASIC METABOLIC PANEL - CANCER CENTER ONLY
BUN, Bld: 23 mg/dL — ABNORMAL HIGH (ref 7–22)
CO2: 25 mEq/L (ref 18–33)
Chloride: 103 mEq/L (ref 98–108)
Creat: 1.2 mg/dl (ref 0.6–1.2)
Potassium: 4.4 mEq/L (ref 3.3–4.7)

## 2012-07-05 MED ORDER — BORTEZOMIB CHEMO SQ INJECTION 3.5 MG (2.5MG/ML)
1.3000 mg/m2 | Freq: Once | INTRAMUSCULAR | Status: AC
  Administered 2012-07-05: 2 mg via SUBCUTANEOUS
  Filled 2012-07-05: qty 2

## 2012-07-05 MED ORDER — ACETAMINOPHEN 325 MG PO TABS
650.0000 mg | ORAL_TABLET | Freq: Once | ORAL | Status: AC
  Administered 2012-07-05: 650 mg via ORAL

## 2012-07-05 MED ORDER — DARBEPOETIN ALFA-POLYSORBATE 300 MCG/0.6ML IJ SOLN: 300.0000 ug | Freq: Once | INTRAMUSCULAR | Status: DC

## 2012-07-05 MED ORDER — ZOLEDRONIC ACID 4 MG/5ML IV CONC
3.3000 mg | Freq: Once | INTRAVENOUS | Status: AC
  Administered 2012-07-05: 3.3 mg via INTRAVENOUS
  Filled 2012-07-05: qty 4.13

## 2012-07-05 NOTE — Patient Instructions (Addendum)
Bortezomib injection What is this medicine? BORTEZOMIB (bor TEZ oh mib) is a chemotherapy drug. It slows the growth of cancer cells. This medicine is used to treat multiple myeloma, lymphoma, and other cancers. This medicine may be used for other purposes; ask your health care provider or pharmacist if you have questions. What should I tell my health care provider before I take this medicine? They need to know if you have any of these conditions: -heart disease -irregular heartbeat -liver disease -low blood counts, like low white blood cells, platelets, or hemoglobin -peripheral neuropathy -taking medicine for blood pressure -an unusual or allergic reaction to bortezomib, mannitol, boron, other medicines, foods, dyes, or preservatives -pregnant or trying to get pregnant -breast-feeding How should I use this medicine? This medicine is for injection into a vein or for injection under the skin. It is given by a health care professional in a hospital or clinic setting. Talk to your pediatrician regarding the use of this medicine in children. Special care may be needed. Overdosage: If you think you have taken too much of this medicine contact a poison control center or emergency room at once. NOTE: This medicine is only for you. Do not share this medicine with others. What if I miss a dose? It is important not to miss your dose. Call your doctor or health care professional if you are unable to keep an appointment. What may interact with this medicine? -medicines for diabetes -medicines to increase blood counts like filgrastim, pegfilgrastim, sargramostim -zalcitabine Talk to your doctor or health care professional before taking any of these medicines: -acetaminophen -aspirin -ibuprofen -ketoprofen -naproxen This list may not describe all possible interactions. Give your health care provider a list of all the medicines, herbs, non-prescription drugs, or dietary supplements you use. Also  tell them if you smoke, drink alcohol, or use illegal drugs. Some items may interact with your medicine. What should I watch for while using this medicine? Visit your doctor for checks on your progress. This drug may make you feel generally unwell. This is not uncommon, as chemotherapy can affect healthy cells as well as cancer cells. Report any side effects. Continue your course of treatment even though you feel ill unless your doctor tells you to stop. You may get drowsy or dizzy. Do not drive, use machinery, or do anything that needs mental alertness until you know how this medicine affects you. Do not stand or sit up quickly, especially if you are an older patient. This reduces the risk of dizzy or fainting spells. In some cases, you may be given additional medicines to help with side effects. Follow all directions for their use. Call your doctor or health care professional for advice if you get a fever, chills or sore throat, or other symptoms of a cold or flu. Do not treat yourself. This drug decreases your body's ability to fight infections. Try to avoid being around people who are sick. This medicine may increase your risk to bruise or bleed. Call your doctor or health care professional if you notice any unusual bleeding. Be careful brushing and flossing your teeth or using a toothpick because you may get an infection or bleed more easily. If you have any dental work done, tell your dentist you are receiving this medicine. Avoid taking products that contain aspirin, acetaminophen, ibuprofen, naproxen, or ketoprofen unless instructed by your doctor. These medicines may hide a fever. Do not become pregnant while taking this medicine. Women should inform their doctor if they wish to   become pregnant or think they might be pregnant. There is a potential for serious side effects to an unborn child. Talk to your health care professional or pharmacist for more information. Do not breast-feed an infant while  taking this medicine. You may have vomiting or diarrhea while taking this medicine. Drink water or other fluids as directed. What side effects may I notice from receiving this medicine? Side effects that you should report to your doctor or health care professional as soon as possible: -allergic reactions like skin rash, itching or hives, swelling of the face, lips, or tongue -breathing problems -changes in hearing -changes in vision -fast, irregular heartbeat -feeling faint or lightheaded, falls -pain, tingling, numbness in the hands or feet -seizures -swelling of the ankles, feet, hands -unusual bleeding or bruising -unusually weak or tired -vomiting Side effects that usually do not require medical attention (report to your doctor or health care professional if they continue or are bothersome): -changes in emotions or moods -constipation -diarrhea -loss of appetite -headache -irritation at site where injected -nausea This list may not describe all possible side effects. Call your doctor for medical advice about side effects. You may report side effects to FDA at 1-800-FDA-1088. Where should I keep my medicine? This drug is given in a hospital or clinic and will not be stored at home. NOTE: This sheet is a summary. It may not cover all possible information. If you have questions about this medicine, talk to your doctor, pharmacist, or health care provider.  2012, [employer]. (11/11/2010 11:42:36 AM)Bendamustine Injection What is this medicine? BENDAMUSTINE (BEN da MUS teen) is an anti-cancer drug used to treat a certain type of leukemia. This medicine may be used for other purposes; ask your health care provider or pharmacist if you have questions. What should I tell my health care provider before I take this medicine? They need to know if you have any of these conditions: -kidney disease -liver disease -an unusual or allergic reaction to bendamustine, mannitol, other  medicines, foods, dyes, or preservatives -pregnant or trying to get pregnant -breast-feeding How should I use this medicine? This medicine is for infusion into a vein. It is given by a health care professional in a hospital or clinic setting. Talk to your pediatrician regarding the use of this medicine in children. Special care may be needed. Overdosage: If you think you have taken too much of this medicine contact a poison control center or emergency room at once. NOTE: This medicine is only for you. Do not share this medicine with others. What if I miss a dose? It is important not to miss your dose. Call your doctor or health care professional if you are unable to keep an appointment. What may interact with this medicine? Do not take this medicine with any of the following medications: -clozapine This medicine may also interact with the following medications: -atazanavir -cimetidine -ciprofloxacin -enoxacin -fluvoxamine -medicines for seizures like carbamazepine and phenobarbital -mexiletine -rifampin -tacrine -thiabendazole -zileuton This list may not describe all possible interactions. Give your health care provider a list of all the medicines, herbs, non-prescription drugs, or dietary supplements you use. Also tell them if you smoke, drink alcohol, or use illegal drugs. Some items may interact with your medicine. What should I watch for while using this medicine? Your condition will be monitored carefully while you are receiving this medicine. This drug may make you feel generally unwell. This is not uncommon, as chemotherapy can affect healthy cells as well as cancer cells.  Report any side effects. Continue your course of treatment even though you feel ill unless your doctor tells you to stop. Call your doctor or health care professional for advice if you get a fever, chills or sore throat, or other symptoms of a cold or flu. Do not treat yourself. This drug decreases your body's  ability to fight infections. Try to avoid being around people who are sick. This medicine may increase your risk to bruise or bleed. Call your doctor or health care professional if you notice any unusual bleeding. Be careful brushing and flossing your teeth or using a toothpick because you may get an infection or bleed more easily. If you have any dental work done, tell your dentist you are receiving this medicine. Avoid taking products that contain aspirin, acetaminophen, ibuprofen, naproxen, or ketoprofen unless instructed by your doctor. These medicines may hide a fever. Do not become pregnant while taking this medicine. Women should inform their doctor if they wish to become pregnant or think they might be pregnant. There is a potential for serious side effects to an unborn child. Men should inform their doctors if they wish to father a child. This medicine may lower sperm counts. Talk to your health care professional or pharmacist for more information. Do not breast-feed an infant while taking this medicine. What side effects may I notice from receiving this medicine? Side effects that you should report to your doctor or health care professional as soon as possible: -allergic reactions like skin rash, itching or hives, swelling of the face, lips, or tongue -low blood counts - this medicine may decrease the number of white blood cells, red blood cells and platelets. You may be at increased risk for infections and bleeding. -signs of infection - fever or chills, cough, sore throat, pain or difficulty passing urine -signs of decreased platelets or bleeding - bruising, pinpoint red spots on the skin, black, tarry stools, blood in the urine -signs of decreased red blood cells - unusually weak or tired, fainting spells, lightheadedness -trouble passing urine or change in the amount of urine Side effects that usually do not require medical attention (report to your doctor or health care professional if  they continue or are bothersome): -diarrhea This list may not describe all possible side effects. Call your doctor for medical advice about side effects. You may report side effects to FDA at 1-800-FDA-1088. Where should I keep my medicine? This drug is given in a hospital or clinic and will not be stored at home. NOTE: This sheet is a summary. It may not cover all possible information. If you have questions about this medicine, talk to your doctor, pharmacist, or health care provider.  2012, [employer]. (01/08/2008 3:58:27 PM)Zoledronic Acid injection (Hypercalcemia, Oncology) What is this medicine? ZOLEDRONIC ACID (ZOE le dron ik AS id) lowers the amount of calcium loss from bone. It is used to treat too much calcium in your blood from cancer. It is also used to prevent complications of cancer that has spread to the bone. This medicine may be used for other purposes; ask your health care provider or pharmacist if you have questions. What should I tell my health care provider before I take this medicine? They need to know if you have any of these conditions: -aspirin-sensitive asthma -dental disease -kidney disease -an unusual or allergic reaction to zoledronic acid, other medicines, foods, dyes, or preservatives -pregnant or trying to get pregnant -breast-feeding How should I use this medicine? This medicine is for infusion  into a vein. It is given by a health care professional in a hospital or clinic setting. Talk to your pediatrician regarding the use of this medicine in children. Special care may be needed. Overdosage: If you think you have taken too much of this medicine contact a poison control center or emergency room at once. NOTE: This medicine is only for you. Do not share this medicine with others. What if I miss a dose? It is important not to miss your dose. Call your doctor or health care professional if you are unable to keep an appointment. What may interact with  this medicine? -certain antibiotics given by injection -NSAIDs, medicines for pain and inflammation, like ibuprofen or naproxen -some diuretics like bumetanide, furosemide -teriparatide -thalidomide This list may not describe all possible interactions. Give your health care provider a list of all the medicines, herbs, non-prescription drugs, or dietary supplements you use. Also tell them if you smoke, drink alcohol, or use illegal drugs. Some items may interact with your medicine. What should I watch for while using this medicine? Visit your doctor or health care professional for regular checkups. It may be some time before you see the benefit from this medicine. Do not stop taking your medicine unless your doctor tells you to. Your doctor may order blood tests or other tests to see how you are doing. Women should inform their doctor if they wish to become pregnant or think they might be pregnant. There is a potential for serious side effects to an unborn child. Talk to your health care professional or pharmacist for more information. You should make sure that you get enough calcium and vitamin D while you are taking this medicine. Discuss the foods you eat and the vitamins you take with your health care professional. Some people who take this medicine have severe bone, joint, and/or muscle pain. This medicine may also increase your risk for a broken thigh bone. Tell your doctor right away if you have pain in your upper leg or groin. Tell your doctor if you have any pain that does not go away or that gets worse. What side effects may I notice from receiving this medicine? Side effects that you should report to your doctor or health care professional as soon as possible: -allergic reactions like skin rash, itching or hives, swelling of the face, lips, or tongue -anxiety, confusion, or depression -breathing problems -changes in vision -feeling faint or lightheaded, falls -jaw burning, cramping,  pain -muscle cramps, stiffness, or weakness -trouble passing urine or change in the amount of urine Side effects that usually do not require medical attention (report to your doctor or health care professional if they continue or are bothersome): -bone, joint, or muscle pain -fever -hair loss -irritation at site where injected -loss of appetite -nausea, vomiting -stomach upset -tired This list may not describe all possible side effects. Call your doctor for medical advice about side effects. You may report side effects to FDA at 1-800-FDA-1088. Where should I keep my medicine? This drug is given in a hospital or clinic and will not be stored at home. NOTE: This sheet is a summary. It may not cover all possible information. If you have questions about this medicine, talk to your doctor, pharmacist, or health care provider.  2012, [employer]. (04/02/2011 9:06:58 AM)

## 2012-07-06 ENCOUNTER — Ambulatory Visit: Payer: Medicare Other

## 2012-07-06 ENCOUNTER — Other Ambulatory Visit: Payer: Medicare Other | Admitting: Lab

## 2012-07-11 ENCOUNTER — Ambulatory Visit: Payer: Medicare Other

## 2012-07-12 ENCOUNTER — Encounter: Payer: Self-pay | Admitting: Hematology & Oncology

## 2012-07-12 ENCOUNTER — Encounter: Payer: Medicare Other | Admitting: Physical Therapy

## 2012-07-13 ENCOUNTER — Ambulatory Visit (HOSPITAL_BASED_OUTPATIENT_CLINIC_OR_DEPARTMENT_OTHER): Payer: Medicare Other

## 2012-07-13 ENCOUNTER — Other Ambulatory Visit (HOSPITAL_BASED_OUTPATIENT_CLINIC_OR_DEPARTMENT_OTHER): Payer: Medicare Other | Admitting: Lab

## 2012-07-13 VITALS — BP 101/68 | HR 84 | Temp 97.7°F | Resp 20

## 2012-07-13 DIAGNOSIS — D631 Anemia in chronic kidney disease: Secondary | ICD-10-CM

## 2012-07-13 DIAGNOSIS — C9000 Multiple myeloma not having achieved remission: Secondary | ICD-10-CM

## 2012-07-13 DIAGNOSIS — Z5112 Encounter for antineoplastic immunotherapy: Secondary | ICD-10-CM

## 2012-07-13 DIAGNOSIS — N189 Chronic kidney disease, unspecified: Secondary | ICD-10-CM

## 2012-07-13 LAB — CBC WITH DIFFERENTIAL (CANCER CENTER ONLY)
BASO#: 0 10*3/uL (ref 0.0–0.2)
Eosinophils Absolute: 0.2 10*3/uL (ref 0.0–0.5)
HCT: 31 % — ABNORMAL LOW (ref 34.8–46.6)
HGB: 10.2 g/dL — ABNORMAL LOW (ref 11.6–15.9)
LYMPH#: 0.6 10*3/uL — ABNORMAL LOW (ref 0.9–3.3)
MONO#: 0.7 10*3/uL (ref 0.1–0.9)
NEUT#: 3.9 10*3/uL (ref 1.5–6.5)
NEUT%: 71.4 % (ref 39.6–80.0)
RBC: 3.06 10*6/uL — ABNORMAL LOW (ref 3.70–5.32)

## 2012-07-13 MED ORDER — SODIUM CHLORIDE 0.9 % IV SOLN
Freq: Once | INTRAVENOUS | Status: AC
  Administered 2012-07-13: 11:00:00 via INTRAVENOUS

## 2012-07-13 MED ORDER — HEPARIN SOD (PORK) LOCK FLUSH 100 UNIT/ML IV SOLN
500.0000 [IU] | Freq: Once | INTRAVENOUS | Status: AC
  Administered 2012-07-13: 500 [IU] via INTRAVENOUS
  Filled 2012-07-13: qty 5

## 2012-07-13 MED ORDER — IMMUNE GLOBULIN (HUMAN) 20 GM/200ML IV SOLN
40.0000 g | Freq: Once | INTRAVENOUS | Status: AC
  Administered 2012-07-13: 40 g via INTRAVENOUS
  Filled 2012-07-13: qty 400

## 2012-07-13 MED ORDER — LORAZEPAM 0.5 MG PO TABS
0.5000 mg | ORAL_TABLET | Freq: Once | ORAL | Status: AC
  Administered 2012-07-13: 0.5 mg via ORAL

## 2012-07-13 MED ORDER — DIPHENHYDRAMINE HCL 25 MG PO CAPS
25.0000 mg | ORAL_CAPSULE | Freq: Once | ORAL | Status: AC
  Administered 2012-07-13: 25 mg via ORAL

## 2012-07-13 MED ORDER — SODIUM CHLORIDE 0.9 % IJ SOLN
10.0000 mL | INTRAMUSCULAR | Status: DC | PRN
  Administered 2012-07-13: 10 mL via INTRAVENOUS
  Filled 2012-07-13: qty 10

## 2012-07-13 MED ORDER — ACETAMINOPHEN 325 MG PO TABS
650.0000 mg | ORAL_TABLET | Freq: Once | ORAL | Status: AC
  Administered 2012-07-13: 650 mg via ORAL

## 2012-07-13 MED ORDER — BORTEZOMIB CHEMO SQ INJECTION 3.5 MG (2.5MG/ML)
1.3000 mg/m2 | Freq: Once | INTRAMUSCULAR | Status: AC
  Administered 2012-07-13: 2 mg via SUBCUTANEOUS
  Filled 2012-07-13: qty 2

## 2012-07-13 NOTE — Patient Instructions (Addendum)
Immune Globulin Injection What is this medicine? IMMUNE GLOBULIN (im MUNE GLOB yoo lin) helps to prevent or reduce the severity of certain infections in patients who are at risk. This medicine is collected from the pooled blood of many donors. It is used to treat immune system problems, thrombocytopenia, and Kawasaki syndrome. This medicine may be used for other purposes; ask your health care provider or pharmacist if you have questions. What should I tell my health care provider before I take this medicine? They need to know if you have any of these conditions: - diabetes - extremely low or no immune antibodies in the blood - heart disease - history of blood clots - hyperprolinemia - infection in the blood, sepsis - kidney disease - taking medicine that may change kidney function - ask your health care provider about your medicine - an unusual or allergic reaction to human immune globulin, albumin, maltose, sucrose, polysorbate 80, other medicines, foods, dyes, or preservatives - pregnant or trying to get pregnant - breast-feeding How should I use this medicine? This medicine is for injection into a muscle or infusion into a vein or skin. It is usually given by a health care professional in a hospital or clinic setting. In rare cases, some brands of this medicine might be given at home. You will be taught how to give this medicine. Use exactly as directed. Take your medicine at regular intervals. Do not take your medicine more often than directed. Talk to your pediatrician regarding the use of this medicine in children. Special care may be needed. Overdosage: If you think you have taken too much of this medicine contact a poison control center or emergency room at once. NOTE: This medicine is only for you. Do not share this medicine with others. What if I miss a dose? It is important not to miss your dose. Call your doctor or health care professional if you are unable to keep an  appointment. If you give yourself the medicine and you miss a dose, take it as soon as you can. If it is almost time for your next dose, take only that dose. Do not take double or extra doses. What may interact with this medicine? -aspirin and aspirin-like medicines -cisplatin -cyclosporine -medicines for infection like acyclovir, adefovir, amphotericin B, bacitracin, cidofovir, foscarnet, ganciclovir, gentamicin, pentamidine, vancomycin -NSAIDS, medicines for pain and inflammation, like ibuprofen or naproxen -pamidronate -vaccines -zoledronic acid This list may not describe all possible interactions. Give your health care provider a list of all the medicines, herbs, non-prescription drugs, or dietary supplements you use. Also tell them if you smoke, drink alcohol, or use illegal drugs. Some items may interact with your medicine. What should I watch for while using this medicine? Your condition will be monitored carefully while you are receiving this medicine. This medicine is made from pooled blood donations of many different people. It may be possible to pass an infection in this medicine. However, the donors are screened for infections and all products are tested for HIV and hepatitis. The medicine is treated to kill most or all bacteria and viruses. Talk to your doctor about the risks and benefits of this medicine. Do not have vaccinations for at least 14 days before, or until at least 3 months after receiving this medicine. What side effects may I notice from receiving this medicine? Side effects that you should report to your doctor or health care professional as soon as possible: -allergic reactions like skin rash, itching or hives, swelling of   the face, lips, or tongue -breathing problems -chest pain or tightness -fever, chills -headache with nausea, vomiting -neck pain or difficulty moving neck -pain when moving eyes -pain, swelling, warmth in the leg -problems with balance,  talking, walking -sudden weight gain -swelling of the ankles, feet, hands -trouble passing urine or change in the amount of urine Side effects that usually do not require medical attention (report to your doctor or health care professional if they continue or are bothersome): -dizzy, drowsy -flushing -increased sweating -leg cramps -muscle aches and pains -pain at site where injected This list may not describe all possible side effects. Call your doctor for medical advice about side effects. You may report side effects to FDA at 1-800-FDA-1088. Where should I keep my medicine? Keep out of the reach of children. This drug is usually given in a hospital or clinic and will not be stored at home. In rare cases, some brands of this medicine may be given at home. If you are using this medicine at home, you will be instructed on how to store this medicine. Throw away any unused medicine after the expiration date on the label. NOTE: This sheet is a summary. It may not cover all possible information. If you have questions about this medicine, talk to your doctor, pharmacist, or health care provider.  2012, [employer]. (12/25/2008 11:44:49 AM)Bortezomib injection What is this medicine? BORTEZOMIB (bor TEZ oh mib) is a chemotherapy drug. It slows the growth of cancer cells. This medicine is used to treat multiple myeloma, lymphoma, and other cancers. This medicine may be used for other purposes; ask your health care provider or pharmacist if you have questions. What should I tell my health care provider before I take this medicine? They need to know if you have any of these conditions: -heart disease -irregular heartbeat -liver disease -low blood counts, like low white blood cells, platelets, or hemoglobin -peripheral neuropathy -taking medicine for blood pressure -an unusual or allergic reaction to bortezomib, mannitol, boron, other medicines, foods, dyes, or preservatives -pregnant  or trying to get pregnant -breast-feeding How should I use this medicine? This medicine is for injection into a vein or for injection under the skin. It is given by a health care professional in a hospital or clinic setting. Talk to your pediatrician regarding the use of this medicine in children. Special care may be needed. Overdosage: If you think you have taken too much of this medicine contact a poison control center or emergency room at once. NOTE: This medicine is only for you. Do not share this medicine with others. What if I miss a dose? It is important not to miss your dose. Call your doctor or health care professional if you are unable to keep an appointment. What may interact with this medicine? -medicines for diabetes -medicines to increase blood counts like filgrastim, pegfilgrastim, sargramostim -zalcitabine Talk to your doctor or health care professional before taking any of these medicines: -acetaminophen -aspirin -ibuprofen -ketoprofen -naproxen This list may not describe all possible interactions. Give your health care provider a list of all the medicines, herbs, non-prescription drugs, or dietary supplements you use. Also tell them if you smoke, drink alcohol, or use illegal drugs. Some items may interact with your medicine. What should I watch for while using this medicine? Visit your doctor for checks on your progress. This drug may make you feel generally unwell. This is not uncommon, as chemotherapy can affect healthy cells as well as cancer cells. Report any side effects.  Continue your course of treatment even though you feel ill unless your doctor tells you to stop. You may get drowsy or dizzy. Do not drive, use machinery, or do anything that needs mental alertness until you know how this medicine affects you. Do not stand or sit up quickly, especially if you are an older patient. This reduces the risk of dizzy or fainting spells. In some cases, you may be given  additional medicines to help with side effects. Follow all directions for their use. Call your doctor or health care professional for advice if you get a fever, chills or sore throat, or other symptoms of a cold or flu. Do not treat yourself. This drug decreases your body's ability to fight infections. Try to avoid being around people who are sick. This medicine may increase your risk to bruise or bleed. Call your doctor or health care professional if you notice any unusual bleeding. Be careful brushing and flossing your teeth or using a toothpick because you may get an infection or bleed more easily. If you have any dental work done, tell your dentist you are receiving this medicine. Avoid taking products that contain aspirin, acetaminophen, ibuprofen, naproxen, or ketoprofen unless instructed by your doctor. These medicines may hide a fever. Do not become pregnant while taking this medicine. Women should inform their doctor if they wish to become pregnant or think they might be pregnant. There is a potential for serious side effects to an unborn child. Talk to your health care professional or pharmacist for more information. Do not breast-feed an infant while taking this medicine. You may have vomiting or diarrhea while taking this medicine. Drink water or other fluids as directed. What side effects may I notice from receiving this medicine? Side effects that you should report to your doctor or health care professional as soon as possible: -allergic reactions like skin rash, itching or hives, swelling of the face, lips, or tongue -breathing problems -changes in hearing -changes in vision -fast, irregular heartbeat -feeling faint or lightheaded, falls -pain, tingling, numbness in the hands or feet -seizures -swelling of the ankles, feet, hands -unusual bleeding or bruising -unusually weak or tired -vomiting Side effects that usually do not require medical attention (report to your doctor or  health care professional if they continue or are bothersome): -changes in emotions or moods -constipation -diarrhea -loss of appetite -headache -irritation at site where injected -nausea This list may not describe all possible side effects. Call your doctor for medical advice about side effects. You may report side effects to FDA at 1-800-FDA-1088. Where should I keep my medicine? This drug is given in a hospital or clinic and will not be stored at home. NOTE: This sheet is a summary. It may not cover all possible information. If you have questions about this medicine, talk to your doctor, pharmacist, or health care provider.  2012, [employer]. (11/11/2010 11:42:36 AM)

## 2012-07-14 ENCOUNTER — Other Ambulatory Visit: Payer: Self-pay | Admitting: Hematology & Oncology

## 2012-07-15 ENCOUNTER — Other Ambulatory Visit: Payer: Self-pay | Admitting: Hematology & Oncology

## 2012-07-17 ENCOUNTER — Ambulatory Visit: Payer: Medicare Other | Admitting: Physical Therapy

## 2012-07-19 ENCOUNTER — Ambulatory Visit: Payer: Medicare Other | Attending: Hematology & Oncology | Admitting: Physical Therapy

## 2012-07-19 DIAGNOSIS — M25569 Pain in unspecified knee: Secondary | ICD-10-CM | POA: Insufficient documentation

## 2012-07-19 DIAGNOSIS — R5381 Other malaise: Secondary | ICD-10-CM | POA: Insufficient documentation

## 2012-07-19 DIAGNOSIS — M25659 Stiffness of unspecified hip, not elsewhere classified: Secondary | ICD-10-CM | POA: Insufficient documentation

## 2012-07-19 DIAGNOSIS — IMO0001 Reserved for inherently not codable concepts without codable children: Secondary | ICD-10-CM | POA: Insufficient documentation

## 2012-07-21 ENCOUNTER — Ambulatory Visit: Payer: Medicare Other | Admitting: Physical Therapy

## 2012-07-24 ENCOUNTER — Ambulatory Visit: Payer: Medicare Other | Admitting: Physical Therapy

## 2012-07-25 ENCOUNTER — Ambulatory Visit: Payer: Medicare Other

## 2012-07-26 ENCOUNTER — Ambulatory Visit (HOSPITAL_BASED_OUTPATIENT_CLINIC_OR_DEPARTMENT_OTHER): Payer: Medicare Other

## 2012-07-26 ENCOUNTER — Encounter: Payer: Medicare Other | Admitting: Physical Therapy

## 2012-07-26 ENCOUNTER — Encounter: Payer: Self-pay | Admitting: Pharmacist

## 2012-07-26 ENCOUNTER — Ambulatory Visit (HOSPITAL_BASED_OUTPATIENT_CLINIC_OR_DEPARTMENT_OTHER): Payer: Medicare Other | Admitting: Hematology & Oncology

## 2012-07-26 ENCOUNTER — Other Ambulatory Visit (HOSPITAL_BASED_OUTPATIENT_CLINIC_OR_DEPARTMENT_OTHER): Payer: Medicare Other | Admitting: Lab

## 2012-07-26 VITALS — BP 108/63 | HR 91 | Temp 98.3°F | Resp 16 | Ht 59.0 in | Wt 135.0 lb

## 2012-07-26 DIAGNOSIS — N189 Chronic kidney disease, unspecified: Secondary | ICD-10-CM

## 2012-07-26 DIAGNOSIS — C9000 Multiple myeloma not having achieved remission: Secondary | ICD-10-CM

## 2012-07-26 DIAGNOSIS — D801 Nonfamilial hypogammaglobulinemia: Secondary | ICD-10-CM

## 2012-07-26 DIAGNOSIS — Z5111 Encounter for antineoplastic chemotherapy: Secondary | ICD-10-CM

## 2012-07-26 LAB — CBC WITH DIFFERENTIAL (CANCER CENTER ONLY)
BASO%: 0.3 % (ref 0.0–2.0)
EOS%: 4.9 % (ref 0.0–7.0)
HCT: 30.6 % — ABNORMAL LOW (ref 34.8–46.6)
LYMPH%: 15.6 % (ref 14.0–48.0)
MCHC: 31.7 g/dL — ABNORMAL LOW (ref 32.0–36.0)
MCV: 103 fL — ABNORMAL HIGH (ref 81–101)
NEUT%: 61.5 % (ref 39.6–80.0)
RDW: 18.4 % — ABNORMAL HIGH (ref 11.1–15.7)

## 2012-07-26 LAB — CMP (CANCER CENTER ONLY)
CO2: 28 mEq/L (ref 18–33)
Creat: 1.8 mg/dl — ABNORMAL HIGH (ref 0.6–1.2)
Glucose, Bld: 106 mg/dL (ref 73–118)
Total Bilirubin: 0.5 mg/dl (ref 0.20–1.60)
Total Protein: 6.9 g/dL (ref 6.4–8.1)

## 2012-07-26 LAB — TECHNOLOGIST REVIEW CHCC SATELLITE

## 2012-07-26 MED ORDER — HEPARIN SOD (PORK) LOCK FLUSH 100 UNIT/ML IV SOLN
500.0000 [IU] | Freq: Once | INTRAVENOUS | Status: AC | PRN
  Administered 2012-07-26: 500 [IU]
  Filled 2012-07-26: qty 5

## 2012-07-26 MED ORDER — DEXAMETHASONE SODIUM PHOSPHATE 10 MG/ML IJ SOLN
10.0000 mg | Freq: Once | INTRAMUSCULAR | Status: AC
  Administered 2012-07-26: 10 mg via INTRAVENOUS

## 2012-07-26 MED ORDER — SODIUM CHLORIDE 0.9 % IJ SOLN
10.0000 mL | INTRAMUSCULAR | Status: DC | PRN
  Administered 2012-07-26: 10 mL
  Filled 2012-07-26: qty 10

## 2012-07-26 MED ORDER — SODIUM CHLORIDE 0.9 % IV SOLN
Freq: Once | INTRAVENOUS | Status: AC
  Administered 2012-07-26: 13:00:00 via INTRAVENOUS

## 2012-07-26 MED ORDER — SODIUM CHLORIDE 0.9 % IV SOLN
60.0000 mg/m2 | Freq: Once | INTRAVENOUS | Status: AC
  Administered 2012-07-26: 100 mg via INTRAVENOUS
  Filled 2012-07-26: qty 20

## 2012-07-26 MED ORDER — ONDANSETRON 8 MG/50ML IVPB (CHCC)
8.0000 mg | Freq: Once | INTRAVENOUS | Status: AC
  Administered 2012-07-26: 8 mg via INTRAVENOUS

## 2012-07-26 MED ORDER — ACETAMINOPHEN 325 MG PO TABS
650.0000 mg | ORAL_TABLET | Freq: Once | ORAL | Status: AC
  Administered 2012-07-26: 650 mg via ORAL

## 2012-07-26 MED ORDER — ALTEPLASE 2 MG IJ SOLR
2.0000 mg | Freq: Once | INTRAMUSCULAR | Status: AC | PRN
  Administered 2012-07-26: 2 mg
  Filled 2012-07-26: qty 2

## 2012-07-26 MED ORDER — BORTEZOMIB CHEMO SQ INJECTION 3.5 MG (2.5MG/ML)
1.3000 mg/m2 | Freq: Once | INTRAMUSCULAR | Status: AC
  Administered 2012-07-26: 2 mg via SUBCUTANEOUS
  Filled 2012-07-26: qty 2

## 2012-07-26 NOTE — Progress Notes (Signed)
This office note has been dictated.

## 2012-07-26 NOTE — Patient Instructions (Signed)
Call if needed

## 2012-07-26 NOTE — Patient Instructions (Signed)
Bendamustine Injection What is this medicine? BENDAMUSTINE (BEN da MUS teen) is an anti-cancer drug used to treat a certain type of leukemia. This medicine may be used for other purposes; ask your health care provider or pharmacist if you have questions. What should I tell my health care provider before I take this medicine? They need to know if you have any of these conditions: -kidney disease -liver disease -an unusual or allergic reaction to bendamustine, mannitol, other medicines, foods, dyes, or preservatives -pregnant or trying to get pregnant -breast-feeding How should I use this medicine? This medicine is for infusion into a vein. It is given by a health care professional in a hospital or clinic setting. Talk to your pediatrician regarding the use of this medicine in children. Special care may be needed. Overdosage: If you think you have taken too much of this medicine contact a poison control center or emergency room at once. NOTE: This medicine is only for you. Do not share this medicine with others. What if I miss a dose? It is important not to miss your dose. Call your doctor or health care professional if you are unable to keep an appointment. What may interact with this medicine? Do not take this medicine with any of the following medications: -clozapine This medicine may also interact with the following medications: -atazanavir -cimetidine -ciprofloxacin -enoxacin -fluvoxamine -medicines for seizures like carbamazepine and phenobarbital -mexiletine -rifampin -tacrine -thiabendazole -zileuton This list may not describe all possible interactions. Give your health care provider a list of all the medicines, herbs, non-prescription drugs, or dietary supplements you use. Also tell them if you smoke, drink alcohol, or use illegal drugs. Some items may interact with your medicine. What should I watch for while using this medicine? Your condition will be monitored carefully  while you are receiving this medicine. This drug may make you feel generally unwell. This is not uncommon, as chemotherapy can affect healthy cells as well as cancer cells. Report any side effects. Continue your course of treatment even though you feel ill unless your doctor tells you to stop. Call your doctor or health care professional for advice if you get a fever, chills or sore throat, or other symptoms of a cold or flu. Do not treat yourself. This drug decreases your body's ability to fight infections. Try to avoid being around people who are sick. This medicine may increase your risk to bruise or bleed. Call your doctor or health care professional if you notice any unusual bleeding. Be careful brushing and flossing your teeth or using a toothpick because you may get an infection or bleed more easily. If you have any dental work done, tell your dentist you are receiving this medicine. Avoid taking products that contain aspirin, acetaminophen, ibuprofen, naproxen, or ketoprofen unless instructed by your doctor. These medicines may hide a fever. Do not become pregnant while taking this medicine. Women should inform their doctor if they wish to become pregnant or think they might be pregnant. There is a potential for serious side effects to an unborn child. Men should inform their doctors if they wish to father a child. This medicine may lower sperm counts. Talk to your health care professional or pharmacist for more information. Do not breast-feed an infant while taking this medicine. What side effects may I notice from receiving this medicine? Side effects that you should report to your doctor or health care professional as soon as possible: -allergic reactions like skin rash, itching or hives, swelling of the face,   lips, or tongue -low blood counts - this medicine may decrease the number of white blood cells, red blood cells and platelets. You may be at increased risk for infections and  bleeding. -signs of infection - fever or chills, cough, sore throat, pain or difficulty passing urine -signs of decreased platelets or bleeding - bruising, pinpoint red spots on the skin, black, tarry stools, blood in the urine -signs of decreased red blood cells - unusually weak or tired, fainting spells, lightheadedness -trouble passing urine or change in the amount of urine Side effects that usually do not require medical attention (report to your doctor or health care professional if they continue or are bothersome): -diarrhea This list may not describe all possible side effects. Call your doctor for medical advice about side effects. You may report side effects to FDA at 1-800-FDA-1088. Where should I keep my medicine? This drug is given in a hospital or clinic and will not be stored at home. NOTE: This sheet is a summary. It may not cover all possible information. If you have questions about this medicine, talk to your doctor, pharmacist, or health care provider.  2012, [employer]vier/Gold Standard. (01/08/2008 3:58:27 PM)   Bortezomib injection What is this medicine? BORTEZOMIB (bor TEZ oh mib) is a chemotherapy drug. It slows the growth of cancer cells. This medicine is used to treat multiple myeloma, lymphoma, and other cancers. This medicine may be used for other purposes; ask your health care provider or pharmacist if you have questions. What should I tell my health care provider before I take this medicine? They need to know if you have any of these conditions: -heart disease -irregular heartbeat -liver disease -low blood counts, like low white blood cells, platelets, or hemoglobin -peripheral neuropathy -taking medicine for blood pressure -an unusual or allergic reaction to bortezomib, mannitol, boron, other medicines, foods, dyes, or preservatives -pregnant or trying to get pregnant -breast-feeding How should I use this medicine? This medicine is for injection into a vein or for  injection under the skin. It is given by a health care professional in a hospital or clinic setting. Talk to your pediatrician regarding the use of this medicine in children. Special care may be needed. Overdosage: If you think you have taken too much of this medicine contact a poison control center or emergency room at once. NOTE: This medicine is only for you. Do not share this medicine with others. What if I miss a dose? It is important not to miss your dose. Call your doctor or health care professional if you are unable to keep an appointment. What may interact with this medicine? -medicines for diabetes -medicines to increase blood counts like filgrastim, pegfilgrastim, sargramostim -zalcitabine Talk to your doctor or health care professional before taking any of these medicines: -acetaminophen -aspirin -ibuprofen -ketoprofen -naproxen This list may not describe all possible interactions. Give your health care provider a list of all the medicines, herbs, non-prescription drugs, or dietary supplements you use. Also tell them if you smoke, drink alcohol, or use illegal drugs. Some items may interact with your medicine. What should I watch for while using this medicine? Visit your doctor for checks on your progress. This drug may make you feel generally unwell. This is not uncommon, as chemotherapy can affect healthy cells as well as cancer cells. Report any side effects. Continue your course of treatment even though you feel ill unless your doctor tells you to stop. You may get drowsy or dizzy. Do not drive, use machinery,   or do anything that needs mental alertness until you know how this medicine affects you. Do not stand or sit up quickly, especially if you are an older patient. This reduces the risk of dizzy or fainting spells. In some cases, you may be given additional medicines to help with side effects. Follow all directions for their use. Call your doctor or health care professional  for advice if you get a fever, chills or sore throat, or other symptoms of a cold or flu. Do not treat yourself. This drug decreases your body's ability to fight infections. Try to avoid being around people who are sick. This medicine may increase your risk to bruise or bleed. Call your doctor or health care professional if you notice any unusual bleeding. Be careful brushing and flossing your teeth or using a toothpick because you may get an infection or bleed more easily. If you have any dental work done, tell your dentist you are receiving this medicine. Avoid taking products that contain aspirin, acetaminophen, ibuprofen, naproxen, or ketoprofen unless instructed by your doctor. These medicines may hide a fever. Do not become pregnant while taking this medicine. Women should inform their doctor if they wish to become pregnant or think they might be pregnant. There is a potential for serious side effects to an unborn child. Talk to your health care professional or pharmacist for more information. Do not breast-feed an infant while taking this medicine. You may have vomiting or diarrhea while taking this medicine. Drink water or other fluids as directed. What side effects may I notice from receiving this medicine? Side effects that you should report to your doctor or health care professional as soon as possible: -allergic reactions like skin rash, itching or hives, swelling of the face, lips, or tongue -breathing problems -changes in hearing -changes in vision -fast, irregular heartbeat -feeling faint or lightheaded, falls -pain, tingling, numbness in the hands or feet -seizures -swelling of the ankles, feet, hands -unusual bleeding or bruising -unusually weak or tired -vomiting Side effects that usually do not require medical attention (report to your doctor or health care professional if they continue or are bothersome): -changes in emotions or moods -constipation -diarrhea -loss of  appetite -headache -irritation at site where injected -nausea This list may not describe all possible side effects. Call your doctor for medical advice about side effects. You may report side effects to FDA at 1-800-FDA-1088. Where should I keep my medicine? This drug is given in a hospital or clinic and will not be stored at home. NOTE: This sheet is a summary. It may not cover all possible information. If you have questions about this medicine, talk to your doctor, pharmacist, or health care provider.  2012, Elsevier/Gold Standard. (11/11/2010 11:42:36 AM) 

## 2012-07-27 ENCOUNTER — Ambulatory Visit: Payer: Medicare Other | Admitting: Hematology & Oncology

## 2012-07-27 ENCOUNTER — Ambulatory Visit (HOSPITAL_BASED_OUTPATIENT_CLINIC_OR_DEPARTMENT_OTHER): Payer: Medicare Other

## 2012-07-27 ENCOUNTER — Other Ambulatory Visit: Payer: Medicare Other | Admitting: Lab

## 2012-07-27 VITALS — BP 137/72 | HR 97 | Temp 97.0°F | Resp 20

## 2012-07-27 DIAGNOSIS — C9000 Multiple myeloma not having achieved remission: Secondary | ICD-10-CM

## 2012-07-27 DIAGNOSIS — Z5111 Encounter for antineoplastic chemotherapy: Secondary | ICD-10-CM

## 2012-07-27 DIAGNOSIS — L299 Pruritus, unspecified: Secondary | ICD-10-CM

## 2012-07-27 MED ORDER — DEXAMETHASONE SODIUM PHOSPHATE 10 MG/ML IJ SOLN
10.0000 mg | Freq: Once | INTRAMUSCULAR | Status: AC
  Administered 2012-07-27: 10 mg via INTRAVENOUS

## 2012-07-27 MED ORDER — FAMOTIDINE IN NACL 20-0.9 MG/50ML-% IV SOLN
40.0000 mg | Freq: Once | INTRAVENOUS | Status: AC
  Administered 2012-07-27: 40 mg via INTRAVENOUS

## 2012-07-27 MED ORDER — SODIUM CHLORIDE 0.9 % IV SOLN
60.0000 mg/m2 | Freq: Once | INTRAVENOUS | Status: AC
  Administered 2012-07-27: 100 mg via INTRAVENOUS
  Filled 2012-07-27: qty 20

## 2012-07-27 MED ORDER — SODIUM CHLORIDE 0.9 % IJ SOLN
10.0000 mL | INTRAMUSCULAR | Status: DC | PRN
  Administered 2012-07-27: 10 mL
  Filled 2012-07-27: qty 10

## 2012-07-27 MED ORDER — HEPARIN SOD (PORK) LOCK FLUSH 100 UNIT/ML IV SOLN
500.0000 [IU] | Freq: Once | INTRAVENOUS | Status: AC | PRN
  Administered 2012-07-27: 500 [IU]
  Filled 2012-07-27: qty 5

## 2012-07-27 MED ORDER — SODIUM CHLORIDE 0.9 % IV SOLN
Freq: Once | INTRAVENOUS | Status: AC
  Administered 2012-07-27: 12:00:00 via INTRAVENOUS

## 2012-07-27 MED ORDER — ONDANSETRON 8 MG/50ML IVPB (CHCC)
8.0000 mg | Freq: Once | INTRAVENOUS | Status: AC
  Administered 2012-07-27: 8 mg via INTRAVENOUS

## 2012-07-27 NOTE — Patient Instructions (Addendum)
Bendamustine Injection What is this medicine? BENDAMUSTINE (BEN da MUS teen) is an anti-cancer drug used to treat a certain type of leukemia. This medicine may be used for other purposes; ask your health care provider or pharmacist if you have questions. What should I tell my health care provider before I take this medicine? They need to know if you have any of these conditions: -kidney disease -liver disease -an unusual or allergic reaction to bendamustine, mannitol, other medicines, foods, dyes, or preservatives -pregnant or trying to get pregnant -breast-feeding How should I use this medicine? This medicine is for infusion into a vein. It is given by a health care professional in a hospital or clinic setting. Talk to your pediatrician regarding the use of this medicine in children. Special care may be needed. Overdosage: If you think you have taken too much of this medicine contact a poison control center or emergency room at once. NOTE: This medicine is only for you. Do not share this medicine with others. What if I miss a dose? It is important not to miss your dose. Call your doctor or health care professional if you are unable to keep an appointment. What may interact with this medicine? Do not take this medicine with any of the following medications: -clozapine This medicine may also interact with the following medications: -atazanavir -cimetidine -ciprofloxacin -enoxacin -fluvoxamine -medicines for seizures like carbamazepine and phenobarbital -mexiletine -rifampin -tacrine -thiabendazole -zileuton This list may not describe all possible interactions. Give your health care provider a list of all the medicines, herbs, non-prescription drugs, or dietary supplements you use. Also tell them if you smoke, drink alcohol, or use illegal drugs. Some items may interact with your medicine. What should I watch for while using this medicine? Your condition will be monitored carefully  while you are receiving this medicine. This drug may make you feel generally unwell. This is not uncommon, as chemotherapy can affect healthy cells as well as cancer cells. Report any side effects. Continue your course of treatment even though you feel ill unless your doctor tells you to stop. Call your doctor or health care professional for advice if you get a fever, chills or sore throat, or other symptoms of a cold or flu. Do not treat yourself. This drug decreases your body's ability to fight infections. Try to avoid being around people who are sick. This medicine may increase your risk to bruise or bleed. Call your doctor or health care professional if you notice any unusual bleeding. Be careful brushing and flossing your teeth or using a toothpick because you may get an infection or bleed more easily. If you have any dental work done, tell your dentist you are receiving this medicine. Avoid taking products that contain aspirin, acetaminophen, ibuprofen, naproxen, or ketoprofen unless instructed by your doctor. These medicines may hide a fever. Do not become pregnant while taking this medicine. Women should inform their doctor if they wish to become pregnant or think they might be pregnant. There is a potential for serious side effects to an unborn child. Men should inform their doctors if they wish to father a child. This medicine may lower sperm counts. Talk to your health care professional or pharmacist for more information. Do not breast-feed an infant while taking this medicine. What side effects may I notice from receiving this medicine? Side effects that you should report to your doctor or health care professional as soon as possible: -allergic reactions like skin rash, itching or hives, swelling of the face,   lips, or tongue -low blood counts - this medicine may decrease the number of white blood cells, red blood cells and platelets. You may be at increased risk for infections and  bleeding. -signs of infection - fever or chills, cough, sore throat, pain or difficulty passing urine -signs of decreased platelets or bleeding - bruising, pinpoint red spots on the skin, black, tarry stools, blood in the urine -signs of decreased red blood cells - unusually weak or tired, fainting spells, lightheadedness -trouble passing urine or change in the amount of urine Side effects that usually do not require medical attention (report to your doctor or health care professional if they continue or are bothersome): -diarrhea This list may not describe all possible side effects. Call your doctor for medical advice about side effects. You may report side effects to FDA at 1-800-FDA-1088. Where should I keep my medicine? This drug is given in a hospital or clinic and will not be stored at home. NOTE: This sheet is a summary. It may not cover all possible information. If you have questions about this medicine, talk to your doctor, pharmacist, or health care provider.  2012, [employer]. (01/08/2008 3:58:27 PM) 

## 2012-07-27 NOTE — Progress Notes (Signed)
DIAGNOSES: 1. Recurrent kappa light chain myeloma. 2. 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  CURRENT THERAPY: 1. Status post 1 cycle of bendamustine/Velcade/Decadron. 2. Zometa 4 mg IV q.4 weeks. 3. Aranesp 300 mcg subcu as needed for hemoglobin less than 10. 4. IVIG q.4 weeks.  INTERIM HISTORY:  [name] comes in for her followup.  She is actually doing quite well.  She has really no specific complaints since we last saw her.  She does feel a little tired.  She has had no nausea or vomiting with the chemotherapy.  Pain has not been much of an issue.  She has had no leg swelling.  She has had no fever.  There have been no rashes.  There has been no diarrhea.  PHYSICAL EXAMINATION:  This is a well-developed, well-nourished white female in no obvious distress.  Vital signs:  98.3, pulse 91, respiratory rate 16, blood pressure 108/63.  Weight is 135.  Head and neck:  Normocephalic, atraumatic skull.  There are no ocular or oral lesions.  There are no palpable cervical or supraclavicular lymph nodes. Lungs:  Clear to percussion and auscultation bilaterally.  Cardiac: Regular rate and rhythm with a normal S1 and S2.  There are no murmurs, rubs or bruits.  Abdomen:  Soft with good bowel sounds.  There is no palpable abdominal mass.  There is no palpable hepatosplenomegaly. Back:  No tenderness over the spine, ribs, or hips.  Extremities:  Some firmness in her thighs secondary to past radiation treatments.  There is no edema in her lower legs.  Skin:  No rashes, ecchymosis or petechia.  LABORATORY STUDIES:  White cell count is 3.9, hemoglobin 9.7, hematocrit 38.6, platelet count 103.  BUN is 13, creatinine 1.8.  Calcium 9.8.  IMPRESSION:  [name] is a 66 year old white female with recurrent kappa light chain myeloma on salvage therapy right now.  For [name], we really have to do a bone marrow to really see how well she is doing.  I  will plan for bone marrow test after 4th cycle of treatment.  We will go ahead with treatment today.  She will get Zometa next week.  I will plan for the IVIG to be given on the 23rd.  I will see [name] back on the 6th of November.    ______________________________ Josph Macho, M.D. PRE/MEDQ  D:  07/26/2012  T:  07/27/2012  Job:  [id number]

## 2012-07-28 ENCOUNTER — Ambulatory Visit: Payer: Medicare Other

## 2012-07-28 LAB — RETICULOCYTES (CHCC)
RBC.: 3.11 MIL/uL — ABNORMAL LOW (ref 3.87–5.11)
Retic Ct Pct: 1.4 % (ref 0.4–2.3)

## 2012-07-31 ENCOUNTER — Ambulatory Visit: Payer: Medicare Other

## 2012-08-02 ENCOUNTER — Ambulatory Visit: Payer: Medicare Other | Admitting: Physical Therapy

## 2012-08-03 ENCOUNTER — Other Ambulatory Visit (HOSPITAL_BASED_OUTPATIENT_CLINIC_OR_DEPARTMENT_OTHER): Payer: Medicare Other | Admitting: Lab

## 2012-08-03 ENCOUNTER — Ambulatory Visit (HOSPITAL_BASED_OUTPATIENT_CLINIC_OR_DEPARTMENT_OTHER): Payer: Medicare Other

## 2012-08-03 VITALS — BP 120/53 | Temp 98.7°F | Resp 20

## 2012-08-03 DIAGNOSIS — N189 Chronic kidney disease, unspecified: Secondary | ICD-10-CM

## 2012-08-03 DIAGNOSIS — N289 Disorder of kidney and ureter, unspecified: Secondary | ICD-10-CM

## 2012-08-03 DIAGNOSIS — C9000 Multiple myeloma not having achieved remission: Secondary | ICD-10-CM

## 2012-08-03 DIAGNOSIS — Z5112 Encounter for antineoplastic immunotherapy: Secondary | ICD-10-CM

## 2012-08-03 DIAGNOSIS — D649 Anemia, unspecified: Secondary | ICD-10-CM

## 2012-08-03 LAB — CBC WITH DIFFERENTIAL (CANCER CENTER ONLY)
BASO%: 0.2 % (ref 0.0–2.0)
Eosinophils Absolute: 0.3 10*3/uL (ref 0.0–0.5)
LYMPH#: 0.2 10*3/uL — ABNORMAL LOW (ref 0.9–3.3)
MONO#: 0.7 10*3/uL (ref 0.1–0.9)
NEUT#: 2.9 10*3/uL (ref 1.5–6.5)
Platelets: 87 10*3/uL — ABNORMAL LOW (ref 145–400)
RBC: 2.88 10*6/uL — ABNORMAL LOW (ref 3.70–5.32)
RDW: 17.9 % — ABNORMAL HIGH (ref 11.1–15.7)
WBC: 4.1 10*3/uL (ref 3.9–10.0)

## 2012-08-03 MED ORDER — ACETAMINOPHEN 325 MG PO TABS
650.0000 mg | ORAL_TABLET | Freq: Once | ORAL | Status: AC
  Administered 2012-08-03: 650 mg via ORAL

## 2012-08-03 MED ORDER — SODIUM CHLORIDE 0.9 % IV SOLN
INTRAVENOUS | Status: DC
  Administered 2012-08-03: 11:00:00 via INTRAVENOUS

## 2012-08-03 MED ORDER — SODIUM CHLORIDE 0.9 % IJ SOLN
10.0000 mL | INTRAMUSCULAR | Status: DC | PRN
  Administered 2012-08-03: 10 mL via INTRAVENOUS
  Filled 2012-08-03: qty 10

## 2012-08-03 MED ORDER — ZOLEDRONIC ACID 4 MG/5ML IV CONC: 4.0000 mg | Freq: Once | INTRAVENOUS | Status: DC

## 2012-08-03 MED ORDER — BORTEZOMIB CHEMO SQ INJECTION 3.5 MG (2.5MG/ML)
1.3000 mg/m2 | Freq: Once | INTRAMUSCULAR | Status: AC
  Administered 2012-08-03: 2 mg via SUBCUTANEOUS
  Filled 2012-08-03: qty 2

## 2012-08-03 MED ORDER — HEPARIN SOD (PORK) LOCK FLUSH 100 UNIT/ML IV SOLN
500.0000 [IU] | Freq: Once | INTRAVENOUS | Status: AC
  Administered 2012-08-03: 500 [IU] via INTRAVENOUS
  Filled 2012-08-03: qty 5

## 2012-08-03 MED ORDER — DARBEPOETIN ALFA-POLYSORBATE 300 MCG/0.6ML IJ SOLN
300.0000 ug | Freq: Once | INTRAMUSCULAR | Status: AC
  Administered 2012-08-03: 300 ug via SUBCUTANEOUS

## 2012-08-03 MED ORDER — ONDANSETRON HCL 8 MG PO TABS
8.0000 mg | ORAL_TABLET | Freq: Once | ORAL | Status: AC
  Administered 2012-08-03: 8 mg via ORAL

## 2012-08-03 MED ORDER — ZOLEDRONIC ACID 4 MG/5ML IV CONC
3.3000 mg | Freq: Once | INTRAVENOUS | Status: AC
  Administered 2012-08-03: 3.3 mg via INTRAVENOUS
  Filled 2012-08-03: qty 4.13

## 2012-08-03 NOTE — Progress Notes (Signed)
10:42 AM OK to treat per Dr. Myna Hidalgo, platelets 87. Teola Bradley, Madaline Lefeber [employer]

## 2012-08-03 NOTE — Patient Instructions (Signed)
Darbepoetin Alfa injection What is this medicine? DARBEPOETIN ALFA (dar be POE e tin AL fa) helps your body make more red blood cells. It is used to treat anemia caused by chronic kidney failure and chemotherapy. This medicine may be used for other purposes; ask your health care provider or pharmacist if you have questions. What should I tell my health care provider before I take this medicine? They need to know if you have any of these conditions: -blood clotting disorders or history of blood clots -cancer patient not on chemotherapy -cystic fibrosis -heart disease, such as angina, heart failure, or a history of a heart attack -hemoglobin level of 12 g/dL or greater -high blood pressure -low levels of folate, iron, or vitamin B12 -seizures -an unusual or allergic reaction to darbepoetin, erythropoietin, albumin, hamster proteins, latex, other medicines, foods, dyes, or preservatives -pregnant or trying to get pregnant -breast-feeding How should I use this medicine? This medicine is for injection into a vein or under the skin. It is usually given by a health care professional in a hospital or clinic setting. If you get this medicine at home, you will be taught how to prepare and give this medicine. Do not shake the solution before you withdraw a dose. Use exactly as directed. Take your medicine at regular intervals. Do not take your medicine more often than directed. It is important that you put your used needles and syringes in a special sharps container. Do not put them in a trash can. If you do not have a sharps container, call your pharmacist or healthcare provider to get one. Talk to your pediatrician regarding the use of this medicine in children. While this medicine may be used in children as young as 1 year for selected conditions, precautions do apply. Overdosage: If you think you have taken too much of this medicine contact a poison control center or emergency room at once. NOTE:  This medicine is only for you. Do not share this medicine with others. What if I miss a dose? If you miss a dose, take it as soon as you can. If it is almost time for your next dose, take only that dose. Do not take double or extra doses. What may interact with this medicine? Do not take this medicine with any of the following medications: -epoetin alfa This list may not describe all possible interactions. Give your health care provider a list of all the medicines, herbs, non-prescription drugs, or dietary supplements you use. Also tell them if you smoke, drink alcohol, or use illegal drugs. Some items may interact with your medicine. What should I watch for while using this medicine? Visit your prescriber or health care professional for regular checks on your progress and for the needed blood tests and blood pressure measurements. It is especially important for the doctor to make sure your hemoglobin level is in the desired range, to limit the risk of potential side effects and to give you the best benefit. Keep all appointments for any recommended tests. Check your blood pressure as directed. Ask your doctor what your blood pressure should be and when you should contact him or her. As your body makes more red blood cells, you may need to take iron, folic acid, or vitamin B supplements. Ask your doctor or health care provider which products are right for you. If you have kidney disease continue dietary restrictions, even though this medication can make you feel better. Talk with your doctor or health care professional about the   foods you eat and the vitamins that you take. What side effects may I notice from receiving this medicine? Side effects that you should report to your doctor or health care professional as soon as possible: -allergic reactions like skin rash, itching or hives, swelling of the face, lips, or tongue -breathing problems -changes in vision -chest pain -confusion, trouble speaking  or understanding -feeling faint or lightheaded, falls -high blood pressure -muscle aches or pains -pain, swelling, warmth in the leg -rapid weight gain -severe headaches -sudden numbness or weakness of the face, arm or leg -trouble walking, dizziness, loss of balance or coordination -seizures (convulsions) -swelling of the ankles, feet, hands -unusually weak or tired Side effects that usually do not require medical attention (report to your doctor or health care professional if they continue or are bothersome): -diarrhea -fever, chills (flu-like symptoms) -headaches -nausea, vomiting -redness, stinging, or swelling at site where injected This list may not describe all possible side effects. Call your doctor for medical advice about side effects. You may report side effects to FDA at 1-800-FDA-1088. Where should I keep my medicine? Keep out of the reach of children. Store in a refrigerator between 2 and 8 degrees C (36 and 46 degrees F). Do not freeze. Do not shake. Throw away any unused portion if using a single-dose vial. Throw away any unused medicine after the expiration date. NOTE: This sheet is a summary. It may not cover all possible information. If you have questions about this medicine, talk to your doctor, pharmacist, or health care provider.  2012, [employer]. (09/17/2008 10:23:57 AM)Bortezomib injection What is this medicine? BORTEZOMIB (bor TEZ oh mib) is a chemotherapy drug. It slows the growth of cancer cells. This medicine is used to treat multiple myeloma, lymphoma, and other cancers. This medicine may be used for other purposes; ask your health care provider or pharmacist if you have questions. What should I tell my health care provider before I take this medicine? They need to know if you have any of these conditions: -heart disease -irregular heartbeat -liver disease -low blood counts, like low white blood cells, platelets, or hemoglobin -peripheral  neuropathy -taking medicine for blood pressure -an unusual or allergic reaction to bortezomib, mannitol, boron, other medicines, foods, dyes, or preservatives -pregnant or trying to get pregnant -breast-feeding How should I use this medicine? This medicine is for injection into a vein or for injection under the skin. It is given by a health care professional in a hospital or clinic setting. Talk to your pediatrician regarding the use of this medicine in children. Special care may be needed. Overdosage: If you think you have taken too much of this medicine contact a poison control center or emergency room at once. NOTE: This medicine is only for you. Do not share this medicine with others. What if I miss a dose? It is important not to miss your dose. Call your doctor or health care professional if you are unable to keep an appointment. What may interact with this medicine? -medicines for diabetes -medicines to increase blood counts like filgrastim, pegfilgrastim, sargramostim -zalcitabine Talk to your doctor or health care professional before taking any of these medicines: -acetaminophen -aspirin -ibuprofen -ketoprofen -naproxen This list may not describe all possible interactions. Give your health care provider a list of all the medicines, herbs, non-prescription drugs, or dietary supplements you use. Also tell them if you smoke, drink alcohol, or use illegal drugs. Some items may interact with your medicine. What should I watch for  while using this medicine? Visit your doctor for checks on your progress. This drug may make you feel generally unwell. This is not uncommon, as chemotherapy can affect healthy cells as well as cancer cells. Report any side effects. Continue your course of treatment even though you feel ill unless your doctor tells you to stop. You may get drowsy or dizzy. Do not drive, use machinery, or do anything that needs mental alertness until you know how this medicine  affects you. Do not stand or sit up quickly, especially if you are an older patient. This reduces the risk of dizzy or fainting spells. In some cases, you may be given additional medicines to help with side effects. Follow all directions for their use. Call your doctor or health care professional for advice if you get a fever, chills or sore throat, or other symptoms of a cold or flu. Do not treat yourself. This drug decreases your body's ability to fight infections. Try to avoid being around people who are sick. This medicine may increase your risk to bruise or bleed. Call your doctor or health care professional if you notice any unusual bleeding. Be careful brushing and flossing your teeth or using a toothpick because you may get an infection or bleed more easily. If you have any dental work done, tell your dentist you are receiving this medicine. Avoid taking products that contain aspirin, acetaminophen, ibuprofen, naproxen, or ketoprofen unless instructed by your doctor. These medicines may hide a fever. Do not become pregnant while taking this medicine. Women should inform their doctor if they wish to become pregnant or think they might be pregnant. There is a potential for serious side effects to an unborn child. Talk to your health care professional or pharmacist for more information. Do not breast-feed an infant while taking this medicine. You may have vomiting or diarrhea while taking this medicine. Drink water or other fluids as directed. What side effects may I notice from receiving this medicine? Side effects that you should report to your doctor or health care professional as soon as possible: -allergic reactions like skin rash, itching or hives, swelling of the face, lips, or tongue -breathing problems -changes in hearing -changes in vision -fast, irregular heartbeat -feeling faint or lightheaded, falls -pain, tingling, numbness in the hands or feet -seizures -swelling of the ankles,  feet, hands -unusual bleeding or bruising -unusually weak or tired -vomiting Side effects that usually do not require medical attention (report to your doctor or health care professional if they continue or are bothersome): -changes in emotions or moods -constipation -diarrhea -loss of appetite -headache -irritation at site where injected -nausea This list may not describe all possible side effects. Call your doctor for medical advice about side effects. You may report side effects to FDA at 1-800-FDA-1088. Where should I keep my medicine? This drug is given in a hospital or clinic and will not be stored at home. NOTE: This sheet is a summary. It may not cover all possible information. If you have questions about this medicine, talk to your doctor, pharmacist, or health care provider.  2012, [employer]. (11/11/2010 11:42:36 AM)Zoledronic Acid injection (Hypercalcemia, Oncology) What is this medicine? ZOLEDRONIC ACID (ZOE le dron ik AS id) lowers the amount of calcium loss from bone. It is used to treat too much calcium in your blood from cancer. It is also used to prevent complications of cancer that has spread to the bone. This medicine may be used for other purposes; ask your health  care provider or pharmacist if you have questions. What should I tell my health care provider before I take this medicine? They need to know if you have any of these conditions: -aspirin-sensitive asthma -dental disease -kidney disease -an unusual or allergic reaction to zoledronic acid, other medicines, foods, dyes, or preservatives -pregnant or trying to get pregnant -breast-feeding How should I use this medicine? This medicine is for infusion into a vein. It is given by a health care professional in a hospital or clinic setting. Talk to your pediatrician regarding the use of this medicine in children. Special care may be needed. Overdosage: If you think you have taken too much of this  medicine contact a poison control center or emergency room at once. NOTE: This medicine is only for you. Do not share this medicine with others. What if I miss a dose? It is important not to miss your dose. Call your doctor or health care professional if you are unable to keep an appointment. What may interact with this medicine? -certain antibiotics given by injection -NSAIDs, medicines for pain and inflammation, like ibuprofen or naproxen -some diuretics like bumetanide, furosemide -teriparatide -thalidomide This list may not describe all possible interactions. Give your health care provider a list of all the medicines, herbs, non-prescription drugs, or dietary supplements you use. Also tell them if you smoke, drink alcohol, or use illegal drugs. Some items may interact with your medicine. What should I watch for while using this medicine? Visit your doctor or health care professional for regular checkups. It may be some time before you see the benefit from this medicine. Do not stop taking your medicine unless your doctor tells you to. Your doctor may order blood tests or other tests to see how you are doing. Women should inform their doctor if they wish to become pregnant or think they might be pregnant. There is a potential for serious side effects to an unborn child. Talk to your health care professional or pharmacist for more information. You should make sure that you get enough calcium and vitamin D while you are taking this medicine. Discuss the foods you eat and the vitamins you take with your health care professional. Some people who take this medicine have severe bone, joint, and/or muscle pain. This medicine may also increase your risk for a broken thigh bone. Tell your doctor right away if you have pain in your upper leg or groin. Tell your doctor if you have any pain that does not go away or that gets worse. What side effects may I notice from receiving this medicine? Side effects  that you should report to your doctor or health care professional as soon as possible: -allergic reactions like skin rash, itching or hives, swelling of the face, lips, or tongue -anxiety, confusion, or depression -breathing problems -changes in vision -feeling faint or lightheaded, falls -jaw burning, cramping, pain -muscle cramps, stiffness, or weakness -trouble passing urine or change in the amount of urine Side effects that usually do not require medical attention (report to your doctor or health care professional if they continue or are bothersome): -bone, joint, or muscle pain -fever -hair loss -irritation at site where injected -loss of appetite -nausea, vomiting -stomach upset -tired This list may not describe all possible side effects. Call your doctor for medical advice about side effects. You may report side effects to FDA at 1-800-FDA-1088. Where should I keep my medicine? This drug is given in a hospital or clinic and will not be stored  at home. NOTE: This sheet is a summary. It may not cover all possible information. If you have questions about this medicine, talk to your doctor, pharmacist, or health care provider.  2012, [employer]. (04/02/2011 9:06:58 AM)

## 2012-08-07 ENCOUNTER — Encounter: Payer: Self-pay | Admitting: Hematology & Oncology

## 2012-08-10 ENCOUNTER — Other Ambulatory Visit: Payer: Medicare Other | Admitting: Lab

## 2012-08-10 ENCOUNTER — Other Ambulatory Visit: Payer: Self-pay | Admitting: *Deleted

## 2012-08-10 ENCOUNTER — Ambulatory Visit: Payer: Medicare Other

## 2012-08-10 ENCOUNTER — Other Ambulatory Visit (HOSPITAL_BASED_OUTPATIENT_CLINIC_OR_DEPARTMENT_OTHER): Payer: Medicare Other | Admitting: Lab

## 2012-08-10 ENCOUNTER — Ambulatory Visit (HOSPITAL_BASED_OUTPATIENT_CLINIC_OR_DEPARTMENT_OTHER): Payer: Medicare Other

## 2012-08-10 VITALS — BP 89/59 | HR 87 | Temp 98.2°F | Resp 18

## 2012-08-10 DIAGNOSIS — Z5112 Encounter for antineoplastic immunotherapy: Secondary | ICD-10-CM

## 2012-08-10 DIAGNOSIS — N189 Chronic kidney disease, unspecified: Secondary | ICD-10-CM

## 2012-08-10 DIAGNOSIS — C9000 Multiple myeloma not having achieved remission: Secondary | ICD-10-CM

## 2012-08-10 DIAGNOSIS — R197 Diarrhea, unspecified: Secondary | ICD-10-CM

## 2012-08-10 LAB — CBC WITH DIFFERENTIAL (CANCER CENTER ONLY)
BASO#: 0 10*3/uL (ref 0.0–0.2)
Eosinophils Absolute: 0.2 10*3/uL (ref 0.0–0.5)
HCT: 28.3 % — ABNORMAL LOW (ref 34.8–46.6)
HGB: 9 g/dL — ABNORMAL LOW (ref 11.6–15.9)
LYMPH%: 10 % — ABNORMAL LOW (ref 14.0–48.0)
MCH: 32.8 pg (ref 26.0–34.0)
MCV: 103 fL — ABNORMAL HIGH (ref 81–101)
MONO%: 13.1 % — ABNORMAL HIGH (ref 0.0–13.0)
NEUT%: 73.5 % (ref 39.6–80.0)
Platelets: 76 10*3/uL — ABNORMAL LOW (ref 145–400)
RBC: 2.74 10*6/uL — ABNORMAL LOW (ref 3.70–5.32)

## 2012-08-10 MED ORDER — DIPHENOXYLATE-ATROPINE 2.5-0.025 MG PO TABS: 1.0000 | ORAL_TABLET | Freq: Four times a day (QID) | ORAL | Status: DC | PRN

## 2012-08-10 MED ORDER — ONDANSETRON HCL 8 MG PO TABS
8.0000 mg | ORAL_TABLET | Freq: Once | ORAL | Status: AC
  Administered 2012-08-10: 8 mg via ORAL

## 2012-08-10 MED ORDER — BORTEZOMIB CHEMO SQ INJECTION 3.5 MG (2.5MG/ML)
1.3000 mg/m2 | Freq: Once | INTRAMUSCULAR | Status: AC
  Administered 2012-08-10: 2 mg via SUBCUTANEOUS
  Filled 2012-08-10: qty 2

## 2012-08-10 MED ORDER — IMMUNE GLOBULIN (HUMAN) 10 GM/100ML IV SOLN
40.0000 g | Freq: Once | INTRAVENOUS | Status: AC
  Administered 2012-08-10: 40 g via INTRAVENOUS
  Filled 2012-08-10: qty 400

## 2012-08-10 MED ORDER — ACETAMINOPHEN 325 MG PO TABS
650.0000 mg | ORAL_TABLET | Freq: Once | ORAL | Status: AC
  Administered 2012-08-10: 650 mg via ORAL

## 2012-08-10 MED ORDER — DIPHENHYDRAMINE HCL 25 MG PO CAPS
25.0000 mg | ORAL_CAPSULE | Freq: Once | ORAL | Status: AC
  Administered 2012-08-10: 25 mg via ORAL

## 2012-08-12 ENCOUNTER — Other Ambulatory Visit: Payer: Self-pay | Admitting: Hematology & Oncology

## 2012-08-13 ENCOUNTER — Other Ambulatory Visit: Payer: Self-pay | Admitting: Hematology & Oncology

## 2012-08-14 ENCOUNTER — Other Ambulatory Visit: Payer: Self-pay | Admitting: Hematology & Oncology

## 2012-08-15 ENCOUNTER — Other Ambulatory Visit: Payer: Self-pay | Admitting: *Deleted

## 2012-08-15 ENCOUNTER — Other Ambulatory Visit: Payer: Self-pay | Admitting: Hematology & Oncology

## 2012-08-15 DIAGNOSIS — R05 Cough: Secondary | ICD-10-CM

## 2012-08-15 DIAGNOSIS — L299 Pruritus, unspecified: Secondary | ICD-10-CM

## 2012-08-15 DIAGNOSIS — J449 Chronic obstructive pulmonary disease, unspecified: Secondary | ICD-10-CM

## 2012-08-15 DIAGNOSIS — Z9109 Other allergy status, other than to drugs and biological substances: Secondary | ICD-10-CM

## 2012-08-15 MED ORDER — HYDROXYZINE HCL 25 MG PO TABS: 25.0000 mg | ORAL_TABLET | Freq: Two times a day (BID) | ORAL | Status: DC | PRN

## 2012-08-15 MED ORDER — BENZONATATE 100 MG PO CAPS: 100.0000 mg | ORAL_CAPSULE | Freq: Every day | ORAL | Status: DC | PRN

## 2012-08-15 MED ORDER — OLOPATADINE HCL 0.1 % OP SOLN: 1.0000 [drp] | Freq: Two times a day (BID) | OPHTHALMIC | Status: DC

## 2012-08-15 MED ORDER — PIRBUTEROL ACETATE 200 MCG/INH IN AERB: 1.0000 | INHALATION_SPRAY | Freq: Two times a day (BID) | RESPIRATORY_TRACT | Status: DC | PRN

## 2012-08-15 MED ORDER — FLUTICASONE-SALMETEROL 250-50 MCG/DOSE IN AEPB: 1.0000 | INHALATION_SPRAY | Freq: Two times a day (BID) | RESPIRATORY_TRACT | Status: DC

## 2012-08-16 ENCOUNTER — Telehealth: Payer: Self-pay | Admitting: Hematology & Oncology

## 2012-08-16 NOTE — Telephone Encounter (Signed)
Pt called requesting refills for Patanol, Maxair, Advair, Atarax, and Tessalon Pearls. Reviewed with Dr Myna Hidalgo. Ok to refill but needs to obtain a PCP. Spoke to the pt about this. She agreed to see a physician with [place] at the [facility] location. Will have Rick schedule an appt for her.

## 2012-08-16 NOTE — Telephone Encounter (Signed)
Pt aware of 11-6 PCP appointment with Dr. Yetta Flock at [facility]

## 2012-08-21 ENCOUNTER — Other Ambulatory Visit: Payer: Self-pay | Admitting: Hematology & Oncology

## 2012-08-23 ENCOUNTER — Other Ambulatory Visit (HOSPITAL_BASED_OUTPATIENT_CLINIC_OR_DEPARTMENT_OTHER): Payer: Medicare Other | Admitting: Lab

## 2012-08-23 ENCOUNTER — Other Ambulatory Visit: Payer: Self-pay | Admitting: Medical

## 2012-08-23 ENCOUNTER — Ambulatory Visit (INDEPENDENT_AMBULATORY_CARE_PROVIDER_SITE_OTHER): Payer: Medicare Other | Admitting: Internal Medicine

## 2012-08-23 ENCOUNTER — Ambulatory Visit (HOSPITAL_BASED_OUTPATIENT_CLINIC_OR_DEPARTMENT_OTHER): Payer: Medicare Other

## 2012-08-23 ENCOUNTER — Ambulatory Visit (HOSPITAL_BASED_OUTPATIENT_CLINIC_OR_DEPARTMENT_OTHER): Payer: Medicare Other | Admitting: Medical

## 2012-08-23 ENCOUNTER — Encounter (HOSPITAL_COMMUNITY)
Admission: RE | Admit: 2012-08-23 | Discharge: 2012-08-23 | Disposition: A | Discharge status: Home / Self Care | Payer: Medicare Other | Source: Ambulatory Visit | Attending: Hematology & Oncology | Admitting: Hematology & Oncology

## 2012-08-23 ENCOUNTER — Encounter: Payer: Self-pay | Admitting: Internal Medicine

## 2012-08-23 VITALS — BP 106/44 | HR 84 | Temp 98.6°F | Resp 16 | Ht <= 58 in | Wt 133.0 lb

## 2012-08-23 VITALS — BP 95/62 | HR 91 | Temp 99.1°F | Resp 14 | Ht <= 58 in | Wt 132.8 lb

## 2012-08-23 DIAGNOSIS — M7021 Olecranon bursitis, right elbow: Secondary | ICD-10-CM

## 2012-08-23 DIAGNOSIS — D801 Nonfamilial hypogammaglobulinemia: Secondary | ICD-10-CM

## 2012-08-23 DIAGNOSIS — Z5112 Encounter for antineoplastic immunotherapy: Secondary | ICD-10-CM

## 2012-08-23 DIAGNOSIS — M7020 Olecranon bursitis, unspecified elbow: Secondary | ICD-10-CM | POA: Insufficient documentation

## 2012-08-23 DIAGNOSIS — Z5111 Encounter for antineoplastic chemotherapy: Secondary | ICD-10-CM

## 2012-08-23 DIAGNOSIS — D631 Anemia in chronic kidney disease: Secondary | ICD-10-CM

## 2012-08-23 DIAGNOSIS — C9000 Multiple myeloma not having achieved remission: Secondary | ICD-10-CM

## 2012-08-23 DIAGNOSIS — J45909 Unspecified asthma, uncomplicated: Secondary | ICD-10-CM

## 2012-08-23 DIAGNOSIS — K219 Gastro-esophageal reflux disease without esophagitis: Secondary | ICD-10-CM | POA: Insufficient documentation

## 2012-08-23 DIAGNOSIS — D649 Anemia, unspecified: Secondary | ICD-10-CM

## 2012-08-23 DIAGNOSIS — N289 Disorder of kidney and ureter, unspecified: Secondary | ICD-10-CM

## 2012-08-23 DIAGNOSIS — E039 Hypothyroidism, unspecified: Secondary | ICD-10-CM

## 2012-08-23 DIAGNOSIS — E559 Vitamin D deficiency, unspecified: Secondary | ICD-10-CM

## 2012-08-23 LAB — CMP (CANCER CENTER ONLY)
ALT(SGPT): 19 U/L (ref 10–47)
AST: 29 U/L (ref 11–38)
Albumin: 3.4 g/dL (ref 3.3–5.5)
BUN, Bld: 20 mg/dL (ref 7–22)
Calcium: 9.8 mg/dL (ref 8.0–10.3)
Chloride: 101 mEq/L (ref 98–108)
Potassium: 4.6 mEq/L (ref 3.3–4.7)
Total Protein: 6.9 g/dL (ref 6.4–8.1)

## 2012-08-23 LAB — CBC WITH DIFFERENTIAL (CANCER CENTER ONLY)
BASO%: 0.3 % (ref 0.0–2.0)
EOS%: 3.4 % (ref 0.0–7.0)
HGB: 7.8 g/dL — ABNORMAL LOW (ref 11.6–15.9)
LYMPH#: 0.5 10*3/uL — ABNORMAL LOW (ref 0.9–3.3)
MCHC: 31.3 g/dL — ABNORMAL LOW (ref 32.0–36.0)
NEUT#: 2.3 10*3/uL (ref 1.5–6.5)
RDW: 18.3 % — ABNORMAL HIGH (ref 11.1–15.7)

## 2012-08-23 MED ORDER — BORTEZOMIB CHEMO SQ INJECTION 3.5 MG (2.5MG/ML)
1.3000 mg/m2 | Freq: Once | INTRAMUSCULAR | Status: AC
  Administered 2012-08-23: 2 mg via SUBCUTANEOUS
  Filled 2012-08-23: qty 2

## 2012-08-23 MED ORDER — SODIUM CHLORIDE 0.9 % IJ SOLN
10.0000 mL | INTRAMUSCULAR | Status: DC | PRN
  Filled 2012-08-23: qty 10

## 2012-08-23 MED ORDER — ONDANSETRON 8 MG/50ML IVPB (CHCC)
8.0000 mg | Freq: Once | INTRAVENOUS | Status: AC
  Administered 2012-08-23: 8 mg via INTRAVENOUS

## 2012-08-23 MED ORDER — SODIUM CHLORIDE 0.9 % IV SOLN
Freq: Once | INTRAVENOUS | Status: AC
  Administered 2012-08-23: 12:00:00 via INTRAVENOUS

## 2012-08-23 MED ORDER — HEPARIN SOD (PORK) LOCK FLUSH 100 UNIT/ML IV SOLN
500.0000 [IU] | Freq: Once | INTRAVENOUS | Status: DC | PRN
  Filled 2012-08-23: qty 5

## 2012-08-23 MED ORDER — SODIUM CHLORIDE 0.9 % IV SOLN
60.0000 mg/m2 | Freq: Once | INTRAVENOUS | Status: AC
  Administered 2012-08-23: 100 mg via INTRAVENOUS
  Filled 2012-08-23: qty 20

## 2012-08-23 MED ORDER — DEXAMETHASONE SODIUM PHOSPHATE 10 MG/ML IJ SOLN
10.0000 mg | Freq: Once | INTRAMUSCULAR | Status: AC
  Administered 2012-08-23: 10 mg via INTRAVENOUS

## 2012-08-23 NOTE — Patient Instructions (Addendum)
Bendamustine Injection What is this medicine? BENDAMUSTINE (BEN da MUS teen) is an anti-cancer drug used to treat a certain type of leukemia. This medicine may be used for other purposes; ask your health care provider or pharmacist if you have questions. What should I tell my health care provider before I take this medicine? They need to know if you have any of these conditions: -kidney disease -liver disease -an unusual or allergic reaction to bendamustine, mannitol, other medicines, foods, dyes, or preservatives -pregnant or trying to get pregnant -breast-feeding How should I use this medicine? This medicine is for infusion into a vein. It is given by a health care professional in a hospital or clinic setting. Talk to your pediatrician regarding the use of this medicine in children. Special care may be needed. Overdosage: If you think you have taken too much of this medicine contact a poison control center or emergency room at once. NOTE: This medicine is only for you. Do not share this medicine with others. What if I miss a dose? It is important not to miss your dose. Call your doctor or health care professional if you are unable to keep an appointment. What may interact with this medicine? Do not take this medicine with any of the following medications: -clozapine This medicine may also interact with the following medications: -atazanavir -cimetidine -ciprofloxacin -enoxacin -fluvoxamine -medicines for seizures like carbamazepine and phenobarbital -mexiletine -rifampin -tacrine -thiabendazole -zileuton This list may not describe all possible interactions. Give your health care provider a list of all the medicines, herbs, non-prescription drugs, or dietary supplements you use. Also tell them if you smoke, drink alcohol, or use illegal drugs. Some items may interact with your medicine. What should I watch for while using this medicine? Your condition will be monitored carefully  while you are receiving this medicine. This drug may make you feel generally unwell. This is not uncommon, as chemotherapy can affect healthy cells as well as cancer cells. Report any side effects. Continue your course of treatment even though you feel ill unless your doctor tells you to stop. Call your doctor or health care professional for advice if you get a fever, chills or sore throat, or other symptoms of a cold or flu. Do not treat yourself. This drug decreases your body's ability to fight infections. Try to avoid being around people who are sick. This medicine may increase your risk to bruise or bleed. Call your doctor or health care professional if you notice any unusual bleeding. Be careful brushing and flossing your teeth or using a toothpick because you may get an infection or bleed more easily. If you have any dental work done, tell your dentist you are receiving this medicine. Avoid taking products that contain aspirin, acetaminophen, ibuprofen, naproxen, or ketoprofen unless instructed by your doctor. These medicines may hide a fever. Do not become pregnant while taking this medicine. Women should inform their doctor if they wish to become pregnant or think they might be pregnant. There is a potential for serious side effects to an unborn child. Men should inform their doctors if they wish to father a child. This medicine may lower sperm counts. Talk to your health care professional or pharmacist for more information. Do not breast-feed an infant while taking this medicine. What side effects may I notice from receiving this medicine? Side effects that you should report to your doctor or health care professional as soon as possible: -allergic reactions like skin rash, itching or hives, swelling of the face,   lips, or tongue -low blood counts - this medicine may decrease the number of white blood cells, red blood cells and platelets. You may be at increased risk for infections and  bleeding. -signs of infection - fever or chills, cough, sore throat, pain or difficulty passing urine -signs of decreased platelets or bleeding - bruising, pinpoint red spots on the skin, black, tarry stools, blood in the urine -signs of decreased red blood cells - unusually weak or tired, fainting spells, lightheadedness -trouble passing urine or change in the amount of urine Side effects that usually do not require medical attention (report to your doctor or health care professional if they continue or are bothersome): -diarrhea This list may not describe all possible side effects. Call your doctor for medical advice about side effects. You may report side effects to FDA at 1-800-FDA-1088. Where should I keep my medicine? This drug is given in a hospital or clinic and will not be stored at home. NOTE: This sheet is a summary. It may not cover all possible information. If you have questions about this medicine, talk to your doctor, pharmacist, or health care provider.  2012, [employer]/Gold Standard. (01/08/2008 3:58:27 PM)   Bortezomib injection What is this medicine? BORTEZOMIB (bor TEZ oh mib) is a chemotherapy drug. It slows the growth of cancer cells. This medicine is used to treat multiple myeloma, lymphoma, and other cancers. This medicine may be used for other purposes; ask your health care provider or pharmacist if you have questions. What should I tell my health care provider before I take this medicine? They need to know if you have any of these conditions: -heart disease -irregular heartbeat -liver disease -low blood counts, like low white blood cells, platelets, or hemoglobin -peripheral neuropathy -taking medicine for blood pressure -an unusual or allergic reaction to bortezomib, mannitol, boron, other medicines, foods, dyes, or preservatives -pregnant or trying to get pregnant -breast-feeding How should I use this medicine? This medicine is for injection into a vein or for  injection under the skin. It is given by a health care professional in a hospital or clinic setting. Talk to your pediatrician regarding the use of this medicine in children. Special care may be needed. Overdosage: If you think you have taken too much of this medicine contact a poison control center or emergency room at once. NOTE: This medicine is only for you. Do not share this medicine with others. What if I miss a dose? It is important not to miss your dose. Call your doctor or health care professional if you are unable to keep an appointment. What may interact with this medicine? -medicines for diabetes -medicines to increase blood counts like filgrastim, pegfilgrastim, sargramostim -zalcitabine Talk to your doctor or health care professional before taking any of these medicines: -acetaminophen -aspirin -ibuprofen -ketoprofen -naproxen This list may not describe all possible interactions. Give your health care provider a list of all the medicines, herbs, non-prescription drugs, or dietary supplements you use. Also tell them if you smoke, drink alcohol, or use illegal drugs. Some items may interact with your medicine. What should I watch for while using this medicine? Visit your doctor for checks on your progress. This drug may make you feel generally unwell. This is not uncommon, as chemotherapy can affect healthy cells as well as cancer cells. Report any side effects. Continue your course of treatment even though you feel ill unless your doctor tells you to stop. You may get drowsy or dizzy. Do not drive, use machinery,  or do anything that needs mental alertness until you know how this medicine affects you. Do not stand or sit up quickly, especially if you are an older patient. This reduces the risk of dizzy or fainting spells. In some cases, you may be given additional medicines to help with side effects. Follow all directions for their use. Call your doctor or health care professional  for advice if you get a fever, chills or sore throat, or other symptoms of a cold or flu. Do not treat yourself. This drug decreases your body's ability to fight infections. Try to avoid being around people who are sick. This medicine may increase your risk to bruise or bleed. Call your doctor or health care professional if you notice any unusual bleeding. Be careful brushing and flossing your teeth or using a toothpick because you may get an infection or bleed more easily. If you have any dental work done, tell your dentist you are receiving this medicine. Avoid taking products that contain aspirin, acetaminophen, ibuprofen, naproxen, or ketoprofen unless instructed by your doctor. These medicines may hide a fever. Do not become pregnant while taking this medicine. Women should inform their doctor if they wish to become pregnant or think they might be pregnant. There is a potential for serious side effects to an unborn child. Talk to your health care professional or pharmacist for more information. Do not breast-feed an infant while taking this medicine. You may have vomiting or diarrhea while taking this medicine. Drink water or other fluids as directed. What side effects may I notice from receiving this medicine? Side effects that you should report to your doctor or health care professional as soon as possible: -allergic reactions like skin rash, itching or hives, swelling of the face, lips, or tongue -breathing problems -changes in hearing -changes in vision -fast, irregular heartbeat -feeling faint or lightheaded, falls -pain, tingling, numbness in the hands or feet -seizures -swelling of the ankles, feet, hands -unusual bleeding or bruising -unusually weak or tired -vomiting Side effects that usually do not require medical attention (report to your doctor or health care professional if they continue or are bothersome): -changes in emotions or moods -constipation -diarrhea -loss of  appetite -headache -irritation at site where injected -nausea This list may not describe all possible side effects. Call your doctor for medical advice about side effects. You may report side effects to FDA at 1-800-FDA-1088. Where should I keep my medicine? This drug is given in a hospital or clinic and will not be stored at home. NOTE: This sheet is a summary. It may not cover all possible information. If you have questions about this medicine, talk to your doctor, pharmacist, or health care provider.  2012, [employer]/Gold Standard. (11/11/2010 11:42:36 AM)

## 2012-08-23 NOTE — Progress Notes (Signed)
  Subjective:    Patient ID: [name], female    DOB: Feb 17, 1946, 66 y.o.   MRN: [id number]  HPI patient presents to clinic to establish care and for followup of multiple medical problems. History of myeloma followed by oncology currently receiving treatment. Has chronic renal sufficiency and states she takes Celebrex daily for pain. No known history of PUD. Takes stable dose of hypothyroidism and weight appears stable. Has undergone hysterectomy and states mammogram up-to-date January 2013. Asthma under reasonable control with daily Advair. Rinses mouth after use. Has albuterol MDI when necessary for breakthrough. Past history depression taking Cymbalta and states improvement with medication. GERD controlled with Nexium daily. Also notes several month history of swelling of right elbow. No injury, erythema warmth or drainage. No associated pain though is sometimes uncomfortable with pressure.  Past Medical History  Diagnosis Date  . Myeloma   . Anxiety   . Hypothyroidism   . GERD (gastroesophageal reflux disease)   . Myeloma   . OAB (overactive bladder)   . COPD (chronic obstructive pulmonary disease)   . Osteoporosis   . Depression   . Chronic pain   . Renal insufficiency    Past Surgical History  Procedure Date  . Leg surgery   . Cholecystectomy   . Tonsillectomy   . Breast surgery   . Limbal stem cell transplant   . Abdominal hysterectomy   . Esophagogastroduodenoscopy 06/11/2012    Procedure: ESOPHAGOGASTRODUODENOSCOPY (EGD);  Surgeon: Willis Modena, MD;  Location: [facility];  Service: Endoscopy;  Laterality: N/A;    reports that she has never smoked. She has never used smokeless tobacco. She reports that she does not drink alcohol or use illicit drugs. family history is not on file. Allergies  Allergen Reactions  . Trazodone And Nefazodone Other (See Comments)    nightmares  . Hydromorphone Itching  . Tramadol Itching  . Morphine And Related Rash  . Penicillins  Rash     Review of Systems  Gastrointestinal: Positive for nausea. Negative for diarrhea.  Musculoskeletal: Positive for back pain and arthralgias.  Psychiatric/Behavioral: Negative for dysphoric mood.  All other systems reviewed and are negative.       Objective:   Physical Exam  Nursing note and vitals reviewed. Constitutional: She appears well-developed and well-nourished. No distress.  HENT:  Head: Normocephalic and atraumatic.  Right Ear: External ear normal.  Left Ear: External ear normal.  Eyes: Conjunctivae normal are normal. No scleral icterus.  Neck: Neck supple. Carotid bruit is not present.  Cardiovascular: Normal rate, regular rhythm and normal heart sounds.  Exam reveals no gallop and no friction rub.   No murmur heard. Pulmonary/Chest: Effort normal and breath sounds normal. No respiratory distress. She has no wheezes. She has no rales.  Lymphadenopathy:    She has no cervical adenopathy.  Neurological: She is alert.  Skin: She is not diaphoretic.  Psychiatric: She has a normal mood and affect.          Assessment & Plan:

## 2012-08-23 NOTE — Assessment & Plan Note (Signed)
-   Obtain vitamin D level

## 2012-08-23 NOTE — Assessment & Plan Note (Signed)
Avoid further anti-inflammatories. Minimally symptomatic. Discussed aspiration. Patient currently declines.

## 2012-08-23 NOTE — Progress Notes (Signed)
Diagnoses: #1 recurrent, kappa light chain myeloma. #2 anemia, secondary to renal insufficiency.   #3 hypogammaglobulinemia.  Current therapy: #1.  Status post 2 cycle of Bendamustine/Velcade/Decadron. #2 Zometa 3.3 mg IV every 4 weeks. #3 Aranesp 300 mcg subcutaneous as needed for hemoglobin less than 10. #4, IVIG every 4 weeks.  Interim history: [name] presents today for an office followup visit.  Overall, she, reports, that she's doing relatively well except for some excessive fatigue.  She also reports, that she does get lightheaded, and hears her heartbeat in her ears.  In reviewing her CBC her hemoglobin is quite low at 7.8.  She is quite symptomatic.  As such, she will need 2 units of packed red blood cells.  She otherwise does not report any new problems.  Pain, has not been much of an issue.  She does not report any leg swelling.  She does not report any fevers, chills, or night sweats.  She has not reported any headaches, visual changes, or rashes.  She denies any nausea, vomiting, diarrhea, or constipation.  She denies any chest pain, or shortness of breath.  She does have some dyspnea on exertion.  She reports, that her appetite is fair.  Her last, kappa free light chain on 07/26/2012 was 2.88.  The overall plan, is to do a bone marrow, biopsy.  After her fourth cycle of treatment to see how she is responding.  She does receive Zometa as well as IVIG.  Review of Systems: Constitutional:Positive for malaise/fatigue, Negative  fever, chills, weight loss, diaphoresis, activity change, appetite change, and unexpected weight change.  HEENT: Negative for double vision, blurred vision, visual loss, ear pain, tinnitus, congestion, rhinorrhea, epistaxis sore throat or sinus disease, oral pain/lesion, tongue soreness Respiratory: Negative for cough, chest tightness, shortness of breath, wheezing and stridor.  Cardiovascular: Negative for chest pain, palpitations, leg swelling, orthopnea,  PND, or claudication Positive for DOE Gastrointestinal: Negative for nausea, vomiting, abdominal pain, diarrhea, constipation, blood in stool, melena, hematochezia, abdominal distention, anal bleeding, rectal pain, anorexia and hematemesis.  Genitourinary: Negative for dysuria, frequency, hematuria,  Musculoskeletal: Negative for myalgias, back pain, joint swelling, arthralgias and gait problem.  Skin: Negative for rash, color change, pallor and wound.  Neurological:. Negative for dizziness/light-headedness, tremors, seizures, syncope, facial asymmetry, speech difficulty, weakness, numbness, headaches and paresthesias.  Hematological: Negative for adenopathy. Does not bruise/bleed easily.  Psychiatric/Behavioral:  Negative for depression, no loss of interest in normal activity or change in sleep pattern.   Physical Exam: This is a pleasant, 66 year old, well-developed, well-nourished, white female, in no obvious distress Vitals: Temperature 90.6 degrees, pulse 84, respirations 16, blood pressure 106/44.  Weight 133 pounds HEENT reveals a normocephalic, atraumatic skull, no scleral icterus, no oral lesions  Neck is supple without any cervical or supraclavicular adenopathy.  Lungs are clear to auscultation bilaterally. There are no wheezes, rales or rhonci Cardiac is regular rate and rhythm with a normal S1 and S2. There are no murmurs, rubs, or bruits.  Abdomen is soft with good bowel sounds, there is no palpable mass. There is no palpable hepatosplenomegaly. There is no palpable fluid wave.  Musculoskeletal no tenderness of the spine, ribs, or hips.  Extremities there are no clubbing, cyanosis, or edema.  Skin no petechia, purpura or ecchymosis Neurologic is nonfocal.  Laboratory Data: White count 3.6, hemoglobin 7.8, hematocrit 24.9, platelets 76,000  Current Outpatient Prescriptions on File Prior to Visit  Medication Sig Dispense Refill  . acyclovir (ZOVIRAX) 400 MG tablet Take 800  mg by  mouth 2 (two) times daily.      Marland Kitchen albuterol (PROVENTIL HFA;VENTOLIN HFA) 108 (90 BASE) MCG/ACT inhaler Inhale 1-2 puffs into the lungs 3 (three) times daily.  2 Inhaler  6  . ALPRAZolam (XANAX) 0.25 MG tablet TAKE 1 TABLET BY MOUTH 3 TIMES DAILY AS NEEDED FOR ANXIETY/SLEEP  30 tablet  1  . Alum & Mag Hydroxide-Simeth (MAGIC MOUTHWASH) SOLN Take 5 mLs by mouth 4 (four) times daily as needed. [employer] formula--no Simethicone.      Marland Kitchen aspirin EC 81 MG tablet Take 81 mg by mouth at bedtime.      . benzonatate (TESSALON) 100 MG capsule Take 1 capsule (100 mg total) by mouth daily as needed. Cough.  20 capsule  0  . celecoxib (CELEBREX) 100 MG capsule Take 1 capsule (100 mg total) by mouth 2 (two) times daily.  60 capsule  2  . dexamethasone (DECADRON) 4 MG tablet Take 4 mg by mouth. Take 21 days and off 7 days, take with Pomalyst      . diphenoxylate-atropine (LOMOTIL) 2.5-0.025 MG per tablet Take 1 tablet by mouth 4 (four) times daily as needed for diarrhea or loose stools. Diarrhea  30 tablet  0  . esomeprazole (NEXIUM) 40 MG capsule Take 40 mg by mouth 2 (two) times daily.       . Fluticasone-Salmeterol (ADVAIR) 250-50 MCG/DOSE AEPB Inhale 1 puff into the lungs every 12 (twelve) hours.  60 each  1  . HYDROcodone-acetaminophen (NORCO/VICODIN) 5-325 MG per tablet TAKE 1 TO 2 TABLETS EVERY 8 HOURS AS NEEDED FOR PAIN  90 tablet  0  . HYDROcodone-homatropine (HYCODAN) 5-1.5 MG/5ML syrup TAKE 1 TEASPOONFUL EVERY 6 HOURS AS NEEDED FOR COUGH  180 mL  0  . hydrOXYzine (ATARAX/VISTARIL) 25 MG tablet Take 1 tablet (25 mg total) by mouth 2 (two) times daily as needed for itching.  60 tablet  1  . levothyroxine (SYNTHROID, LEVOTHROID) 50 MCG tablet Take 50 mcg by mouth daily.       Marland Kitchen olopatadine (PATANOL) 0.1 % ophthalmic solution Place 1 drop into both eyes 2 (two) times daily.  5 mL  1  . ondansetron (ZOFRAN) 8 MG tablet Take by mouth every 8 (eight) hours as needed.      . pirbuterol (MAXAIR) 200 MCG/INH inhaler  Inhale 1-2 puffs into the lungs every 12 (twelve) hours as needed. Wheezing/shortness of breath.  Use sparingly.  1 Inhaler  1  . POMALYST 3 MG capsule As directed      . promethazine (PHENERGAN) 25 MG tablet TAKE 1/2 TABLET BY MOUTH EVERY 6 HOURS AS NEEDED FOR NAUSEA AND VOMITING  30 tablet  0  . temazepam (RESTORIL) 30 MG capsule Take 1 capsule (30 mg total) by mouth at bedtime as needed. Sleep.  30 capsule  3  . tolterodine (DETROL LA) 4 MG 24 hr capsule Take 4 mg by mouth daily.        Marland Kitchen UNABLE TO FIND 07-26-12  Pt given printed copy of home med list, so she can review with medication at [facility] and let us know any changes.      . Vitamin D, Ergocalciferol, (DRISDOL) 50000 UNITS CAPS TAKE 1 CAPSULE EVERY WEEK  16 capsule  3  . [DISCONTINUED] potassium chloride (KLOR-CON) 10 MEQ CR tablet Take 10 mEq by mouth daily.         Current Facility-Administered Medications on File Prior to Visit  Medication Dose Route Frequency Provider Last Rate Last  Dose  . sodium chloride 0.9 % injection 10 mL  10 mL Intracatheter PRN Josph Macho, MD   10 mL at 07/26/12 1512   Assessment/Plan: This is a pleasant, 66 year old, white female, with the following issues:  #1 recurrent, kappa light chain myeloma.  She's currently on salvage therapy.  she will go ahead with the start of her third cycle today.  We will plan for a bone marrow, biopsy after the fourth cycle of treatment to see how she is really doing.    #2.  Supportive therapy.  Her next dose of Zometa 3.3 mg IV will be November 13.  #3 hypogammaglobulinemia.  Her next dose of IVIG will be on November 20.  #4 anemia.  She does have some renal insufficiency.  I do believe.  Her anemia from today is most likely secondary to #1, as well as chemotherapy.  We will plan to transfuse her with 2 units of packed red blood cells this coming Friday.  We will continue to monitor her blood work closely.  #5.  Followup.  We will follow back up with Ms. Bordelon on  09/20/2012, but before then should there be questions or concerns.

## 2012-08-23 NOTE — Assessment & Plan Note (Signed)
Obtain TSH and free T4 

## 2012-08-23 NOTE — Assessment & Plan Note (Signed)
Obtain Chem-7. Avoid anti-inflammatories.

## 2012-08-24 ENCOUNTER — Ambulatory Visit: Payer: Medicare Other

## 2012-08-25 ENCOUNTER — Ambulatory Visit (HOSPITAL_BASED_OUTPATIENT_CLINIC_OR_DEPARTMENT_OTHER): Payer: Medicare Other

## 2012-08-25 VITALS — BP 118/78 | HR 74 | Temp 97.2°F | Resp 20

## 2012-08-25 DIAGNOSIS — C9000 Multiple myeloma not having achieved remission: Secondary | ICD-10-CM

## 2012-08-25 DIAGNOSIS — Z5111 Encounter for antineoplastic chemotherapy: Secondary | ICD-10-CM

## 2012-08-25 LAB — KAPPA/LAMBDA LIGHT CHAINS
Kappa free light chain: 3.53 mg/dL — ABNORMAL HIGH (ref 0.33–1.94)
Kappa:Lambda Ratio: 117.67 — ABNORMAL HIGH (ref 0.26–1.65)
Lambda Free Lght Chn: 0.03 mg/dL — ABNORMAL LOW (ref 0.57–2.63)

## 2012-08-25 MED ORDER — ONDANSETRON 8 MG/50ML IVPB (CHCC)
8.0000 mg | Freq: Once | INTRAVENOUS | Status: AC
  Administered 2012-08-25: 8 mg via INTRAVENOUS

## 2012-08-25 MED ORDER — HEPARIN SOD (PORK) LOCK FLUSH 100 UNIT/ML IV SOLN
250.0000 [IU] | INTRAVENOUS | Status: DC | PRN
Start: 2012-08-25 — End: 2012-08-25
  Filled 2012-08-25: qty 5

## 2012-08-25 MED ORDER — SODIUM CHLORIDE 0.9 % IV SOLN
60.0000 mg/m2 | Freq: Once | INTRAVENOUS | Status: AC
  Administered 2012-08-25: 100 mg via INTRAVENOUS
  Filled 2012-08-25: qty 20

## 2012-08-25 MED ORDER — ACETAMINOPHEN 325 MG PO TABS
650.0000 mg | ORAL_TABLET | Freq: Once | ORAL | Status: AC
  Administered 2012-08-25: 650 mg via ORAL

## 2012-08-25 MED ORDER — DIPHENHYDRAMINE HCL 25 MG PO CAPS
25.0000 mg | ORAL_CAPSULE | Freq: Once | ORAL | Status: AC
  Administered 2012-08-25: 25 mg via ORAL

## 2012-08-25 MED ORDER — FUROSEMIDE 10 MG/ML IJ SOLN
20.0000 mg | Freq: Once | INTRAMUSCULAR | Status: AC
  Administered 2012-08-25: 20 mg via INTRAVENOUS

## 2012-08-25 MED ORDER — SODIUM CHLORIDE 0.9 % IV SOLN
250.0000 mL | Freq: Once | INTRAVENOUS | Status: AC
  Administered 2012-08-25: 250 mL via INTRAVENOUS

## 2012-08-25 MED ORDER — LORAZEPAM 0.5 MG PO TABS
0.5000 mg | ORAL_TABLET | Freq: Once | ORAL | Status: AC
  Administered 2012-08-25: 0.5 mg via ORAL

## 2012-08-25 MED ORDER — SODIUM CHLORIDE 0.9 % IV SOLN: Freq: Once | INTRAVENOUS | Status: DC

## 2012-08-25 MED ORDER — KETOROLAC TROMETHAMINE 15 MG/ML IJ SOLN
15.0000 mg | Freq: Once | INTRAMUSCULAR | Status: AC
  Administered 2012-08-25: 15 mg via INTRAVENOUS

## 2012-08-25 MED ORDER — DEXAMETHASONE SODIUM PHOSPHATE 10 MG/ML IJ SOLN
10.0000 mg | Freq: Once | INTRAMUSCULAR | Status: AC
  Administered 2012-08-25: 10 mg via INTRAVENOUS

## 2012-08-25 NOTE — Patient Instructions (Addendum)
Blood Transfusion Information WHAT IS A BLOOD TRANSFUSION? A transfusion is the replacement of blood or some of its parts. Blood is made up of multiple cells which provide different functions.  Red blood cells carry oxygen and are used for blood loss replacement.  White blood cells fight against infection.  Platelets control bleeding.  Plasma helps clot blood.  Other blood products are available for specialized needs, such as hemophilia or other clotting disorders. BEFORE THE TRANSFUSION  Who gives blood for transfusions?   You may be able to donate blood to be used at a later date on yourself (autologous donation).  Relatives can be asked to donate blood. This is generally not any safer than if you have received blood from a stranger. The same precautions are taken to ensure safety when a relative's blood is donated.  Healthy volunteers who are fully evaluated to make sure their blood is safe. This is blood bank blood. Transfusion therapy is the safest it has ever been in the practice of medicine. Before blood is taken from a donor, a complete history is taken to make sure that person has no history of diseases nor engages in risky social behavior (examples are intravenous drug use or sexual activity with multiple partners). The donor's travel history is screened to minimize risk of transmitting infections, such as malaria. The donated blood is tested for signs of infectious diseases, such as HIV and hepatitis. The blood is then tested to be sure it is compatible with you in order to minimize the chance of a transfusion reaction. If you or a relative donates blood, this is often done in anticipation of surgery and is not appropriate for emergency situations. It takes many days to process the donated blood. RISKS AND COMPLICATIONS Although transfusion therapy is very safe and saves many lives, the main dangers of transfusion include:   Getting an infectious disease.  Developing a  transfusion reaction. This is an allergic reaction to something in the blood you were given. Every precaution is taken to prevent this. The decision to have a blood transfusion has been considered carefully by your caregiver before blood is given. Blood is not given unless the benefits outweigh the risks. AFTER THE TRANSFUSION  Right after receiving a blood transfusion, you will usually feel much better and more energetic. This is especially true if your red blood cells have gotten low (anemic). The transfusion raises the level of the red blood cells which carry oxygen, and this usually causes an energy increase.  The nurse administering the transfusion will monitor you carefully for complications. HOME CARE INSTRUCTIONS  No special instructions are needed after a transfusion. You may find your energy is better. Speak with your caregiver about any limitations on activity for underlying diseases you may have. SEEK MEDICAL CARE IF:   Your condition is not improving after your transfusion.  You develop redness or irritation at the intravenous (IV) site. SEEK IMMEDIATE MEDICAL CARE IF:  Any of the following symptoms occur over the next 12 hours:  Shaking chills.  You have a temperature by mouth above 102 F (38.9 C), not controlled by medicine.  Chest, back, or muscle pain.  People around you feel you are not acting correctly or are confused.  Shortness of breath or difficulty breathing.  Dizziness and fainting.  You get a rash or develop hives.  You have a decrease in urine output.  Your urine turns a dark color or changes to pink, red, or brown. Any of the following   symptoms occur over the next 10 days:  You have a temperature by mouth above 102 F (38.9 C), not controlled by medicine.  Shortness of breath.  Weakness after normal activity.  The white part of the eye turns yellow (jaundice).  You have a decrease in the amount of urine or are urinating less often.  Your  urine turns a dark color or changes to pink, red, or brown. Document Released: 10/01/2000 Document Revised: 12/27/2011 Document Reviewed: 05/20/2008 [employer] Patient Information 2013 [employer].  

## 2012-08-26 ENCOUNTER — Other Ambulatory Visit: Payer: Self-pay | Admitting: Hematology & Oncology

## 2012-08-26 LAB — TYPE AND SCREEN
Antibody Screen: NEGATIVE
Unit division: 0

## 2012-08-28 ENCOUNTER — Other Ambulatory Visit: Payer: Self-pay | Admitting: Hematology & Oncology

## 2012-08-29 ENCOUNTER — Other Ambulatory Visit: Payer: Self-pay | Admitting: Internal Medicine

## 2012-08-30 ENCOUNTER — Ambulatory Visit (HOSPITAL_BASED_OUTPATIENT_CLINIC_OR_DEPARTMENT_OTHER): Payer: Medicare Other

## 2012-08-30 ENCOUNTER — Other Ambulatory Visit (HOSPITAL_BASED_OUTPATIENT_CLINIC_OR_DEPARTMENT_OTHER): Payer: Medicare Other | Admitting: Lab

## 2012-08-30 ENCOUNTER — Other Ambulatory Visit: Payer: Self-pay | Admitting: *Deleted

## 2012-08-30 VITALS — BP 120/78 | HR 70 | Temp 97.0°F | Resp 18

## 2012-08-30 DIAGNOSIS — R52 Pain, unspecified: Secondary | ICD-10-CM

## 2012-08-30 DIAGNOSIS — D631 Anemia in chronic kidney disease: Secondary | ICD-10-CM

## 2012-08-30 DIAGNOSIS — Z5112 Encounter for antineoplastic immunotherapy: Secondary | ICD-10-CM

## 2012-08-30 DIAGNOSIS — C9000 Multiple myeloma not having achieved remission: Secondary | ICD-10-CM

## 2012-08-30 LAB — CBC WITH DIFFERENTIAL (CANCER CENTER ONLY)
BASO#: 0 10*3/uL (ref 0.0–0.2)
Eosinophils Absolute: 0.2 10*3/uL (ref 0.0–0.5)
HCT: 38.2 % (ref 34.8–46.6)
HGB: 12.1 g/dL (ref 11.6–15.9)
MCH: 32.1 pg (ref 26.0–34.0)
MCV: 101 fL (ref 81–101)
MONO%: 14.7 % — ABNORMAL HIGH (ref 0.0–13.0)
NEUT#: 3.3 10*3/uL (ref 1.5–6.5)
NEUT%: 76 % (ref 39.6–80.0)
RBC: 3.77 10*6/uL (ref 3.70–5.32)

## 2012-08-30 MED ORDER — ZOLEDRONIC ACID 4 MG/100ML IV SOLN: 3.3000 mg | Freq: Once | INTRAVENOUS | Status: DC

## 2012-08-30 MED ORDER — ZOLEDRONIC ACID 4 MG/5ML IV CONC: 3.3000 mg | Freq: Once | INTRAVENOUS | Status: DC

## 2012-08-30 MED ORDER — BORTEZOMIB CHEMO SQ INJECTION 3.5 MG (2.5MG/ML)
1.3000 mg/m2 | Freq: Once | INTRAMUSCULAR | Status: AC
  Administered 2012-08-30: 2 mg via SUBCUTANEOUS
  Filled 2012-08-30: qty 2

## 2012-08-30 MED ORDER — ZOLEDRONIC ACID 4 MG/5ML IV CONC
3.3000 mg | Freq: Once | INTRAVENOUS | Status: AC
  Administered 2012-08-30: 3.3 mg via INTRAVENOUS
  Filled 2012-08-30: qty 4.13

## 2012-08-30 MED ORDER — DARBEPOETIN ALFA-POLYSORBATE 300 MCG/0.6ML IJ SOLN: 300.0000 ug | Freq: Once | INTRAMUSCULAR | Status: DC

## 2012-08-30 MED ORDER — DIPHENHYDRAMINE HCL 25 MG PO CAPS: 25.0000 mg | ORAL_CAPSULE | Freq: Once | ORAL | Status: DC

## 2012-08-30 MED ORDER — HEPARIN SOD (PORK) LOCK FLUSH 100 UNIT/ML IV SOLN
500.0000 [IU] | Freq: Once | INTRAVENOUS | Status: AC
  Administered 2012-08-30: 500 [IU] via INTRAVENOUS
  Filled 2012-08-30: qty 5

## 2012-08-30 MED ORDER — SODIUM CHLORIDE 0.9 % IJ SOLN
10.0000 mL | INTRAMUSCULAR | Status: DC | PRN
  Administered 2012-08-30: 10 mL via INTRAVENOUS
  Filled 2012-08-30: qty 10

## 2012-08-30 MED ORDER — ACETAMINOPHEN 325 MG PO TABS: 650.0000 mg | ORAL_TABLET | Freq: Once | ORAL | Status: DC

## 2012-08-30 MED ORDER — ZOLEDRONIC ACID 4 MG/100ML IV SOLN
3.3000 mg | Freq: Once | INTRAVENOUS | Status: DC
  Filled 2012-08-30: qty 100

## 2012-08-30 MED ORDER — METAXALONE 800 MG PO TABS: 400.0000 mg | ORAL_TABLET | Freq: Three times a day (TID) | ORAL | Status: DC | PRN

## 2012-08-30 NOTE — Patient Instructions (Addendum)
Zoledronic Acid injection (Hypercalcemia, Oncology) What is this medicine? ZOLEDRONIC ACID (ZOE le dron ik AS id) lowers the amount of calcium loss from bone. It is used to treat too much calcium in your blood from cancer. It is also used to prevent complications of cancer that has spread to the bone. This medicine may be used for other purposes; ask your health care provider or pharmacist if you have questions. What should I tell my health care provider before I take this medicine? They need to know if you have any of these conditions: -aspirin-sensitive asthma -dental disease -kidney disease -an unusual or allergic reaction to zoledronic acid, other medicines, foods, dyes, or preservatives -pregnant or trying to get pregnant -breast-feeding How should I use this medicine? This medicine is for infusion into a vein. It is given by a health care professional in a hospital or clinic setting. Talk to your pediatrician regarding the use of this medicine in children. Special care may be needed. Overdosage: If you think you have taken too much of this medicine contact a poison control center or emergency room at once. NOTE: This medicine is only for you. Do not share this medicine with others. What if I miss a dose? It is important not to miss your dose. Call your doctor or health care professional if you are unable to keep an appointment. What may interact with this medicine? -certain antibiotics given by injection -NSAIDs, medicines for pain and inflammation, like ibuprofen or naproxen -some diuretics like bumetanide, furosemide -teriparatide -thalidomide This list may not describe all possible interactions. Give your health care provider a list of all the medicines, herbs, non-prescription drugs, or dietary supplements you use. Also tell them if you smoke, drink alcohol, or use illegal drugs. Some items may interact with your medicine. What should I watch for while using this medicine? Visit  your doctor or health care professional for regular checkups. It may be some time before you see the benefit from this medicine. Do not stop taking your medicine unless your doctor tells you to. Your doctor may order blood tests or other tests to see how you are doing. Women should inform their doctor if they wish to become pregnant or think they might be pregnant. There is a potential for serious side effects to an unborn child. Talk to your health care professional or pharmacist for more information. You should make sure that you get enough calcium and vitamin D while you are taking this medicine. Discuss the foods you eat and the vitamins you take with your health care professional. Some people who take this medicine have severe bone, joint, and/or muscle pain. This medicine may also increase your risk for a broken thigh bone. Tell your doctor right away if you have pain in your upper leg or groin. Tell your doctor if you have any pain that does not go away or that gets worse. What side effects may I notice from receiving this medicine? Side effects that you should report to your doctor or health care professional as soon as possible: -allergic reactions like skin rash, itching or hives, swelling of the face, lips, or tongue -anxiety, confusion, or depression -breathing problems -changes in vision -feeling faint or lightheaded, falls -jaw burning, cramping, pain -muscle cramps, stiffness, or weakness -trouble passing urine or change in the amount of urine Side effects that usually do not require medical attention (report to your doctor or health care professional if they continue or are bothersome): -bone, joint, or muscle pain -  fever -hair loss -irritation at site where injected -loss of appetite -nausea, vomiting -stomach upset -tired This list may not describe all possible side effects. Call your doctor for medical advice about side effects. You may report side effects to FDA at  1-800-FDA-1088. Where should I keep my medicine? This drug is given in a hospital or clinic and will not be stored at home. NOTE: This sheet is a summary. It may not cover all possible information. If you have questions about this medicine, talk to your doctor, pharmacist, or health care provider.  2012, [employer]. (04/02/2011 9:06:58 AM)Bortezomib injection What is this medicine? BORTEZOMIB (bor TEZ oh mib) is a chemotherapy drug. It slows the growth of cancer cells. This medicine is used to treat multiple myeloma, lymphoma, and other cancers. This medicine may be used for other purposes; ask your health care provider or pharmacist if you have questions. What should I tell my health care provider before I take this medicine? They need to know if you have any of these conditions: -heart disease -irregular heartbeat -liver disease -low blood counts, like low white blood cells, platelets, or hemoglobin -peripheral neuropathy -taking medicine for blood pressure -an unusual or allergic reaction to bortezomib, mannitol, boron, other medicines, foods, dyes, or preservatives -pregnant or trying to get pregnant -breast-feeding How should I use this medicine? This medicine is for injection into a vein or for injection under the skin. It is given by a health care professional in a hospital or clinic setting. Talk to your pediatrician regarding the use of this medicine in children. Special care may be needed. Overdosage: If you think you have taken too much of this medicine contact a poison control center or emergency room at once. NOTE: This medicine is only for you. Do not share this medicine with others. What if I miss a dose? It is important not to miss your dose. Call your doctor or health care professional if you are unable to keep an appointment. What may interact with this medicine? -medicines for diabetes -medicines to increase blood counts like filgrastim, pegfilgrastim,  sargramostim -zalcitabine Talk to your doctor or health care professional before taking any of these medicines: -acetaminophen -aspirin -ibuprofen -ketoprofen -naproxen This list may not describe all possible interactions. Give your health care provider a list of all the medicines, herbs, non-prescription drugs, or dietary supplements you use. Also tell them if you smoke, drink alcohol, or use illegal drugs. Some items may interact with your medicine. What should I watch for while using this medicine? Visit your doctor for checks on your progress. This drug may make you feel generally unwell. This is not uncommon, as chemotherapy can affect healthy cells as well as cancer cells. Report any side effects. Continue your course of treatment even though you feel ill unless your doctor tells you to stop. You may get drowsy or dizzy. Do not drive, use machinery, or do anything that needs mental alertness until you know how this medicine affects you. Do not stand or sit up quickly, especially if you are an older patient. This reduces the risk of dizzy or fainting spells. In some cases, you may be given additional medicines to help with side effects. Follow all directions for their use. Call your doctor or health care professional for advice if you get a fever, chills or sore throat, or other symptoms of a cold or flu. Do not treat yourself. This drug decreases your body's ability to fight infections. Try to avoid being around people   who are sick. This medicine may increase your risk to bruise or bleed. Call your doctor or health care professional if you notice any unusual bleeding. Be careful brushing and flossing your teeth or using a toothpick because you may get an infection or bleed more easily. If you have any dental work done, tell your dentist you are receiving this medicine. Avoid taking products that contain aspirin, acetaminophen, ibuprofen, naproxen, or ketoprofen unless instructed by your doctor.  These medicines may hide a fever. Do not become pregnant while taking this medicine. Women should inform their doctor if they wish to become pregnant or think they might be pregnant. There is a potential for serious side effects to an unborn child. Talk to your health care professional or pharmacist for more information. Do not breast-feed an infant while taking this medicine. You may have vomiting or diarrhea while taking this medicine. Drink water or other fluids as directed. What side effects may I notice from receiving this medicine? Side effects that you should report to your doctor or health care professional as soon as possible: -allergic reactions like skin rash, itching or hives, swelling of the face, lips, or tongue -breathing problems -changes in hearing -changes in vision -fast, irregular heartbeat -feeling faint or lightheaded, falls -pain, tingling, numbness in the hands or feet -seizures -swelling of the ankles, feet, hands -unusual bleeding or bruising -unusually weak or tired -vomiting Side effects that usually do not require medical attention (report to your doctor or health care professional if they continue or are bothersome): -changes in emotions or moods -constipation -diarrhea -loss of appetite -headache -irritation at site where injected -nausea This list may not describe all possible side effects. Call your doctor for medical advice about side effects. You may report side effects to FDA at 1-800-FDA-1088. Where should I keep my medicine? This drug is given in a hospital or clinic and will not be stored at home. NOTE: This sheet is a summary. It may not cover all possible information. If you have questions about this medicine, talk to your doctor, pharmacist, or health care provider.  2012, [employer]. (11/11/2010 11:42:36 AM) 

## 2012-08-31 LAB — TSH: TSH: 4.125 u[IU]/mL (ref 0.350–4.500)

## 2012-08-31 LAB — BASIC METABOLIC PANEL
BUN: 19 mg/dL (ref 6–23)
Glucose, Bld: 116 mg/dL — ABNORMAL HIGH (ref 70–99)
Potassium: 4.7 mEq/L (ref 3.5–5.3)

## 2012-09-03 ENCOUNTER — Other Ambulatory Visit: Payer: Self-pay | Admitting: Hematology & Oncology

## 2012-09-04 ENCOUNTER — Other Ambulatory Visit: Payer: Self-pay | Admitting: Hematology & Oncology

## 2012-09-06 ENCOUNTER — Ambulatory Visit: Payer: Medicare Other

## 2012-09-06 ENCOUNTER — Other Ambulatory Visit: Payer: Medicare Other | Admitting: Lab

## 2012-09-06 ENCOUNTER — Other Ambulatory Visit (HOSPITAL_BASED_OUTPATIENT_CLINIC_OR_DEPARTMENT_OTHER): Payer: Medicare Other | Admitting: Lab

## 2012-09-06 ENCOUNTER — Ambulatory Visit (HOSPITAL_BASED_OUTPATIENT_CLINIC_OR_DEPARTMENT_OTHER): Payer: Medicare Other

## 2012-09-06 ENCOUNTER — Telehealth: Payer: Self-pay | Admitting: *Deleted

## 2012-09-06 VITALS — BP 102/61 | HR 92 | Temp 99.6°F | Resp 18

## 2012-09-06 DIAGNOSIS — C9000 Multiple myeloma not having achieved remission: Secondary | ICD-10-CM

## 2012-09-06 DIAGNOSIS — D631 Anemia in chronic kidney disease: Secondary | ICD-10-CM

## 2012-09-06 DIAGNOSIS — N189 Chronic kidney disease, unspecified: Secondary | ICD-10-CM

## 2012-09-06 DIAGNOSIS — D801 Nonfamilial hypogammaglobulinemia: Secondary | ICD-10-CM

## 2012-09-06 DIAGNOSIS — Z5112 Encounter for antineoplastic immunotherapy: Secondary | ICD-10-CM

## 2012-09-06 LAB — CBC WITH DIFFERENTIAL (CANCER CENTER ONLY)
BASO#: 0 10*3/uL (ref 0.0–0.2)
Eosinophils Absolute: 0.2 10*3/uL (ref 0.0–0.5)
HGB: 10.9 g/dL — ABNORMAL LOW (ref 11.6–15.9)
LYMPH%: 2.8 % — ABNORMAL LOW (ref 14.0–48.0)
MCH: 32.2 pg (ref 26.0–34.0)
MCV: 100 fL (ref 81–101)
MONO%: 8.4 % (ref 0.0–13.0)
RBC: 3.38 10*6/uL — ABNORMAL LOW (ref 3.70–5.32)

## 2012-09-06 MED ORDER — HEPARIN SOD (PORK) LOCK FLUSH 100 UNIT/ML IV SOLN
500.0000 [IU] | Freq: Once | INTRAVENOUS | Status: AC
  Administered 2012-09-06: 500 [IU] via INTRAVENOUS
  Filled 2012-09-06: qty 5

## 2012-09-06 MED ORDER — ACETAMINOPHEN 325 MG PO TABS
650.0000 mg | ORAL_TABLET | Freq: Once | ORAL | Status: AC
  Administered 2012-09-06: 650 mg via ORAL

## 2012-09-06 MED ORDER — LORAZEPAM 0.5 MG PO TABS
0.5000 mg | ORAL_TABLET | Freq: Once | ORAL | Status: AC
  Administered 2012-09-06: 0.5 mg via ORAL

## 2012-09-06 MED ORDER — SODIUM CHLORIDE 0.9 % IJ SOLN
10.0000 mL | INTRAMUSCULAR | Status: DC | PRN
  Administered 2012-09-06: 10 mL via INTRAVENOUS
  Filled 2012-09-06: qty 10

## 2012-09-06 MED ORDER — IMMUNE GLOBULIN (HUMAN) 10 GM/100ML IV SOLN
0.8000 g/kg | Freq: Once | INTRAVENOUS | Status: AC
  Administered 2012-09-06: 50 g via INTRAVENOUS
  Filled 2012-09-06: qty 500

## 2012-09-06 MED ORDER — DIPHENHYDRAMINE HCL 25 MG PO CAPS
25.0000 mg | ORAL_CAPSULE | Freq: Once | ORAL | Status: AC
  Administered 2012-09-06: 25 mg via ORAL

## 2012-09-06 MED ORDER — BORTEZOMIB CHEMO SQ INJECTION 3.5 MG (2.5MG/ML)
1.3000 mg/m2 | Freq: Once | INTRAMUSCULAR | Status: AC
  Administered 2012-09-06: 2 mg via SUBCUTANEOUS
  Filled 2012-09-06: qty 2

## 2012-09-06 NOTE — Patient Instructions (Addendum)
Bortezomib injection What is this medicine? BORTEZOMIB (bor TEZ oh mib) is a chemotherapy drug. It slows the growth of cancer cells. This medicine is used to treat multiple myeloma, lymphoma, and other cancers. This medicine may be used for other purposes; ask your health care provider or pharmacist if you have questions. What should I tell my health care provider before I take this medicine? They need to know if you have any of these conditions: -heart disease -irregular heartbeat -liver disease -low blood counts, like low white blood cells, platelets, or hemoglobin -peripheral neuropathy -taking medicine for blood pressure -an unusual or allergic reaction to bortezomib, mannitol, boron, other medicines, foods, dyes, or preservatives -pregnant or trying to get pregnant -breast-feeding How should I use this medicine? This medicine is for injection into a vein or for injection under the skin. It is given by a health care professional in a hospital or clinic setting. Talk to your pediatrician regarding the use of this medicine in children. Special care may be needed. Overdosage: If you think you have taken too much of this medicine contact a poison control center or emergency room at once. NOTE: This medicine is only for you. Do not share this medicine with others. What if I miss a dose? It is important not to miss your dose. Call your doctor or health care professional if you are unable to keep an appointment. What may interact with this medicine? -medicines for diabetes -medicines to increase blood counts like filgrastim, pegfilgrastim, sargramostim -zalcitabine Talk to your doctor or health care professional before taking any of these medicines: -acetaminophen -aspirin -ibuprofen -ketoprofen -naproxen This list may not describe all possible interactions. Give your health care provider a list of all the medicines, herbs, non-prescription drugs, or dietary supplements you use. Also  tell them if you smoke, drink alcohol, or use illegal drugs. Some items may interact with your medicine. What should I watch for while using this medicine? Visit your doctor for checks on your progress. This drug may make you feel generally unwell. This is not uncommon, as chemotherapy can affect healthy cells as well as cancer cells. Report any side effects. Continue your course of treatment even though you feel ill unless your doctor tells you to stop. You may get drowsy or dizzy. Do not drive, use machinery, or do anything that needs mental alertness until you know how this medicine affects you. Do not stand or sit up quickly, especially if you are an older patient. This reduces the risk of dizzy or fainting spells. In some cases, you may be given additional medicines to help with side effects. Follow all directions for their use. Call your doctor or health care professional for advice if you get a fever, chills or sore throat, or other symptoms of a cold or flu. Do not treat yourself. This drug decreases your body's ability to fight infections. Try to avoid being around people who are sick. This medicine may increase your risk to bruise or bleed. Call your doctor or health care professional if you notice any unusual bleeding. Be careful brushing and flossing your teeth or using a toothpick because you may get an infection or bleed more easily. If you have any dental work done, tell your dentist you are receiving this medicine. Avoid taking products that contain aspirin, acetaminophen, ibuprofen, naproxen, or ketoprofen unless instructed by your doctor. These medicines may hide a fever. Do not become pregnant while taking this medicine. Women should inform their doctor if they wish to   become pregnant or think they might be pregnant. There is a potential for serious side effects to an unborn child. Talk to your health care professional or pharmacist for more information. Do not breast-feed an infant while  taking this medicine. You may have vomiting or diarrhea while taking this medicine. Drink water or other fluids as directed. What side effects may I notice from receiving this medicine? Side effects that you should report to your doctor or health care professional as soon as possible: -allergic reactions like skin rash, itching or hives, swelling of the face, lips, or tongue -breathing problems -changes in hearing -changes in vision -fast, irregular heartbeat -feeling faint or lightheaded, falls -pain, tingling, numbness in the hands or feet -seizures -swelling of the ankles, feet, hands -unusual bleeding or bruising -unusually weak or tired -vomiting Side effects that usually do not require medical attention (report to your doctor or health care professional if they continue or are bothersome): -changes in emotions or moods -constipation -diarrhea -loss of appetite -headache -irritation at site where injected -nausea This list may not describe all possible side effects. Call your doctor for medical advice about side effects. You may report side effects to FDA at 1-800-FDA-1088. Where should I keep my medicine? This drug is given in a hospital or clinic and will not be stored at home. NOTE: This sheet is a summary. It may not cover all possible information. If you have questions about this medicine, talk to your doctor, pharmacist, or health care provider.  2012, [employer]. (11/11/2010 11:42:36 AM)   Immune Globulin Injection What is this medicine? IMMUNE GLOBULIN (im MUNE GLOB yoo lin) helps to prevent or reduce the severity of certain infections in patients who are at risk. This medicine is collected from the pooled blood of many donors. It is used to treat immune system problems, thrombocytopenia, and Kawasaki syndrome. This medicine may be used for other purposes; ask your health care provider or pharmacist if you have questions. What should I tell my health care  provider before I take this medicine? They need to know if you have any of these conditions: - diabetes - extremely low or no immune antibodies in the blood - heart disease - history of blood clots - hyperprolinemia - infection in the blood, sepsis - kidney disease - taking medicine that may change kidney function - ask your health care provider about your medicine - an unusual or allergic reaction to human immune globulin, albumin, maltose, sucrose, polysorbate 80, other medicines, foods, dyes, or preservatives - pregnant or trying to get pregnant - breast-feeding How should I use this medicine? This medicine is for injection into a muscle or infusion into a vein or skin. It is usually given by a health care professional in a hospital or clinic setting. In rare cases, some brands of this medicine might be given at home. You will be taught how to give this medicine. Use exactly as directed. Take your medicine at regular intervals. Do not take your medicine more often than directed. Talk to your pediatrician regarding the use of this medicine in children. Special care may be needed. Overdosage: If you think you have taken too much of this medicine contact a poison control center or emergency room at once. NOTE: This medicine is only for you. Do not share this medicine with others. What if I miss a dose? It is important not to miss your dose. Call your doctor or health care professional if you are unable to keep an  appointment. If you give yourself the medicine and you miss a dose, take it as soon as you can. If it is almost time for your next dose, take only that dose. Do not take double or extra doses. What may interact with this medicine? -aspirin and aspirin-like medicines -cisplatin -cyclosporine -medicines for infection like acyclovir, adefovir, amphotericin B, bacitracin, cidofovir, foscarnet, ganciclovir, gentamicin, pentamidine, vancomycin -NSAIDS, medicines for pain and  inflammation, like ibuprofen or naproxen -pamidronate -vaccines -zoledronic acid This list may not describe all possible interactions. Give your health care provider a list of all the medicines, herbs, non-prescription drugs, or dietary supplements you use. Also tell them if you smoke, drink alcohol, or use illegal drugs. Some items may interact with your medicine. What should I watch for while using this medicine? Your condition will be monitored carefully while you are receiving this medicine. This medicine is made from pooled blood donations of many different people. It may be possible to pass an infection in this medicine. However, the donors are screened for infections and all products are tested for HIV and hepatitis. The medicine is treated to kill most or all bacteria and viruses. Talk to your doctor about the risks and benefits of this medicine. Do not have vaccinations for at least 14 days before, or until at least 3 months after receiving this medicine. What side effects may I notice from receiving this medicine? Side effects that you should report to your doctor or health care professional as soon as possible: -allergic reactions like skin rash, itching or hives, swelling of the face, lips, or tongue -breathing problems -chest pain or tightness -fever, chills -headache with nausea, vomiting -neck pain or difficulty moving neck -pain when moving eyes -pain, swelling, warmth in the leg -problems with balance, talking, walking -sudden weight gain -swelling of the ankles, feet, hands -trouble passing urine or change in the amount of urine Side effects that usually do not require medical attention (report to your doctor or health care professional if they continue or are bothersome): -dizzy, drowsy -flushing -increased sweating -leg cramps -muscle aches and pains -pain at site where injected This list may not describe all possible side effects. Call your doctor for medical advice  about side effects. You may report side effects to FDA at 1-800-FDA-1088. Where should I keep my medicine? Keep out of the reach of children. This drug is usually given in a hospital or clinic and will not be stored at home. In rare cases, some brands of this medicine may be given at home. If you are using this medicine at home, you will be instructed on how to store this medicine. Throw away any unused medicine after the expiration date on the label. NOTE: This sheet is a summary. It may not cover all possible information. If you have questions about this medicine, talk to your doctor, pharmacist, or health care provider.  2012, [employer]. (12/25/2008 11:44:49 AM)

## 2012-09-06 NOTE — Telephone Encounter (Signed)
Massage [Staff]    pls notify thyroid nl, vit d nl and kidney test improved from 1.6 to 1.38   LMOM with contact name & number for return call RE: results/SLS

## 2012-09-08 LAB — COMPREHENSIVE METABOLIC PANEL
Alkaline Phosphatase: 72 U/L (ref 39–117)
BUN: 18 mg/dL (ref 6–23)
Glucose, Bld: 92 mg/dL (ref 70–99)
Total Bilirubin: 0.4 mg/dL (ref 0.3–1.2)

## 2012-09-08 LAB — PROTEIN ELECTROPHORESIS, SERUM, WITH REFLEX
Alpha-2-Globulin: 11.9 % — ABNORMAL HIGH (ref 7.1–11.8)
Gamma Globulin: 12.2 % (ref 11.1–18.8)

## 2012-09-08 LAB — KAPPA/LAMBDA LIGHT CHAINS: Lambda Free Lght Chn: <0.03 mg/dL — ABNORMAL LOW (ref 0.57–2.63)

## 2012-09-08 LAB — IGG, IGA, IGM
IgA: <6 mg/dL — ABNORMAL LOW (ref 69–380)
IgM, Serum: 8 mg/dL — ABNORMAL LOW (ref 52–322)

## 2012-09-13 ENCOUNTER — Other Ambulatory Visit: Payer: Self-pay | Admitting: Hematology & Oncology

## 2012-09-13 ENCOUNTER — Encounter: Payer: Self-pay | Admitting: Pharmacist

## 2012-09-13 ENCOUNTER — Other Ambulatory Visit: Payer: Self-pay | Admitting: *Deleted

## 2012-09-13 DIAGNOSIS — R05 Cough: Secondary | ICD-10-CM

## 2012-09-13 DIAGNOSIS — L299 Pruritus, unspecified: Secondary | ICD-10-CM

## 2012-09-13 DIAGNOSIS — Z9109 Other allergy status, other than to drugs and biological substances: Secondary | ICD-10-CM

## 2012-09-13 DIAGNOSIS — J449 Chronic obstructive pulmonary disease, unspecified: Secondary | ICD-10-CM

## 2012-09-13 MED ORDER — FLUTICASONE-SALMETEROL 250-50 MCG/DOSE IN AEPB: 1.0000 | INHALATION_SPRAY | Freq: Two times a day (BID) | RESPIRATORY_TRACT | Status: DC

## 2012-09-13 NOTE — Telephone Encounter (Signed)
Rx to pharmacy; attempt to call pt, line busy/SLS

## 2012-09-16 ENCOUNTER — Other Ambulatory Visit: Payer: Self-pay | Admitting: Hematology & Oncology

## 2012-09-20 ENCOUNTER — Ambulatory Visit (HOSPITAL_BASED_OUTPATIENT_CLINIC_OR_DEPARTMENT_OTHER): Payer: Medicare Other | Admitting: Hematology & Oncology

## 2012-09-20 ENCOUNTER — Other Ambulatory Visit (HOSPITAL_BASED_OUTPATIENT_CLINIC_OR_DEPARTMENT_OTHER): Payer: Medicare Other | Admitting: Lab

## 2012-09-20 ENCOUNTER — Ambulatory Visit (HOSPITAL_BASED_OUTPATIENT_CLINIC_OR_DEPARTMENT_OTHER): Payer: Medicare Other

## 2012-09-20 VITALS — BP 102/56 | HR 59 | Temp 98.0°F | Resp 18 | Ht 59.0 in | Wt 128.0 lb

## 2012-09-20 DIAGNOSIS — C9000 Multiple myeloma not having achieved remission: Secondary | ICD-10-CM

## 2012-09-20 DIAGNOSIS — Z5112 Encounter for antineoplastic immunotherapy: Secondary | ICD-10-CM

## 2012-09-20 DIAGNOSIS — N189 Chronic kidney disease, unspecified: Secondary | ICD-10-CM

## 2012-09-20 DIAGNOSIS — D649 Anemia, unspecified: Secondary | ICD-10-CM

## 2012-09-20 DIAGNOSIS — Z5111 Encounter for antineoplastic chemotherapy: Secondary | ICD-10-CM

## 2012-09-20 LAB — CBC WITH DIFFERENTIAL (CANCER CENTER ONLY)
BASO%: 0.5 % (ref 0.0–2.0)
EOS%: 5.1 % (ref 0.0–7.0)
HCT: 27.8 % — ABNORMAL LOW (ref 34.8–46.6)
LYMPH#: 0.3 10*3/uL — ABNORMAL LOW (ref 0.9–3.3)
MCHC: 32 g/dL (ref 32.0–36.0)
MONO#: 0.7 10*3/uL (ref 0.1–0.9)
NEUT#: 2.8 10*3/uL (ref 1.5–6.5)
NEUT%: 69.1 % (ref 39.6–80.0)
RDW: 18.8 % — ABNORMAL HIGH (ref 11.1–15.7)
WBC: 4.1 10*3/uL (ref 3.9–10.0)

## 2012-09-20 MED ORDER — SODIUM CHLORIDE 0.9 % IV SOLN
60.0000 mg/m2 | Freq: Once | INTRAVENOUS | Status: AC
  Administered 2012-09-20: 100 mg via INTRAVENOUS
  Filled 2012-09-20: qty 20

## 2012-09-20 MED ORDER — HEPARIN SOD (PORK) LOCK FLUSH 100 UNIT/ML IV SOLN
500.0000 [IU] | Freq: Once | INTRAVENOUS | Status: DC | PRN
  Filled 2012-09-20: qty 5

## 2012-09-20 MED ORDER — ACETAMINOPHEN 325 MG PO TABS
650.0000 mg | ORAL_TABLET | Freq: Once | ORAL | Status: AC
  Administered 2012-09-20: 650 mg via ORAL

## 2012-09-20 MED ORDER — SODIUM CHLORIDE 0.9 % IJ SOLN
10.0000 mL | INTRAMUSCULAR | Status: DC | PRN
  Filled 2012-09-20: qty 10

## 2012-09-20 MED ORDER — DIPHENHYDRAMINE HCL 25 MG PO CAPS: 25.0000 mg | ORAL_CAPSULE | Freq: Once | ORAL | Status: DC

## 2012-09-20 MED ORDER — DEXAMETHASONE SODIUM PHOSPHATE 10 MG/ML IJ SOLN
10.0000 mg | Freq: Once | INTRAMUSCULAR | Status: AC
  Administered 2012-09-20: 10 mg via INTRAVENOUS

## 2012-09-20 MED ORDER — BORTEZOMIB CHEMO SQ INJECTION 3.5 MG (2.5MG/ML)
1.3000 mg/m2 | Freq: Once | INTRAMUSCULAR | Status: AC
  Administered 2012-09-20: 2 mg via SUBCUTANEOUS
  Filled 2012-09-20: qty 2

## 2012-09-20 MED ORDER — SODIUM CHLORIDE 0.9 % IV SOLN
Freq: Once | INTRAVENOUS | Status: AC
  Administered 2012-09-20: 13:00:00 via INTRAVENOUS

## 2012-09-20 MED ORDER — ALTEPLASE 2 MG IJ SOLR
2.0000 mg | Freq: Once | INTRAMUSCULAR | Status: DC
  Filled 2012-09-20: qty 2

## 2012-09-20 MED ORDER — ACETAMINOPHEN 325 MG PO TABS: 650.0000 mg | ORAL_TABLET | Freq: Once | ORAL | Status: DC

## 2012-09-20 MED ORDER — DARBEPOETIN ALFA-POLYSORBATE 300 MCG/0.6ML IJ SOLN
300.0000 ug | Freq: Once | INTRAMUSCULAR | Status: AC
  Administered 2012-09-20: 300 ug via SUBCUTANEOUS

## 2012-09-20 MED ORDER — LORAZEPAM 1 MG PO TABS: 1.0000 mg | ORAL_TABLET | Freq: Once | ORAL | Status: DC

## 2012-09-20 MED ORDER — LORAZEPAM 0.5 MG PO TABS: 0.5000 mg | ORAL_TABLET | Freq: Once | ORAL | Status: DC

## 2012-09-20 MED ORDER — ONDANSETRON 8 MG/50ML IVPB (CHCC)
8.0000 mg | Freq: Once | INTRAVENOUS | Status: AC
  Administered 2012-09-20: 8 mg via INTRAVENOUS

## 2012-09-20 NOTE — Patient Instructions (Signed)
Bortezomib injection What is this medicine? BORTEZOMIB (bor TEZ oh mib) is a chemotherapy drug. It slows the growth of cancer cells. This medicine is used to treat multiple myeloma, lymphoma, and other cancers. This medicine may be used for other purposes; ask your health care provider or pharmacist if you have questions. What should I tell my health care provider before I take this medicine? They need to know if you have any of these conditions: -heart disease -irregular heartbeat -liver disease -low blood counts, like low white blood cells, platelets, or hemoglobin -peripheral neuropathy -taking medicine for blood pressure -an unusual or allergic reaction to bortezomib, mannitol, boron, other medicines, foods, dyes, or preservatives -pregnant or trying to get pregnant -breast-feeding How should I use this medicine? This medicine is for injection into a vein or for injection under the skin. It is given by a health care professional in a hospital or clinic setting. Talk to your pediatrician regarding the use of this medicine in children. Special care may be needed. Overdosage: If you think you have taken too much of this medicine contact a poison control center or emergency room at once. NOTE: This medicine is only for you. Do not share this medicine with others. What if I miss a dose? It is important not to miss your dose. Call your doctor or health care professional if you are unable to keep an appointment. What may interact with this medicine? -medicines for diabetes -medicines to increase blood counts like filgrastim, pegfilgrastim, sargramostim -zalcitabine Talk to your doctor or health care professional before taking any of these medicines: -acetaminophen -aspirin -ibuprofen -ketoprofen -naproxen This list may not describe all possible interactions. Give your health care provider a list of all the medicines, herbs, non-prescription drugs, or dietary supplements you use. Also  tell them if you smoke, drink alcohol, or use illegal drugs. Some items may interact with your medicine. What should I watch for while using this medicine? Visit your doctor for checks on your progress. This drug may make you feel generally unwell. This is not uncommon, as chemotherapy can affect healthy cells as well as cancer cells. Report any side effects. Continue your course of treatment even though you feel ill unless your doctor tells you to stop. You may get drowsy or dizzy. Do not drive, use machinery, or do anything that needs mental alertness until you know how this medicine affects you. Do not stand or sit up quickly, especially if you are an older patient. This reduces the risk of dizzy or fainting spells. In some cases, you may be given additional medicines to help with side effects. Follow all directions for their use. Call your doctor or health care professional for advice if you get a fever, chills or sore throat, or other symptoms of a cold or flu. Do not treat yourself. This drug decreases your body's ability to fight infections. Try to avoid being around people who are sick. This medicine may increase your risk to bruise or bleed. Call your doctor or health care professional if you notice any unusual bleeding. Be careful brushing and flossing your teeth or using a toothpick because you may get an infection or bleed more easily. If you have any dental work done, tell your dentist you are receiving this medicine. Avoid taking products that contain aspirin, acetaminophen, ibuprofen, naproxen, or ketoprofen unless instructed by your doctor. These medicines may hide a fever. Do not become pregnant while taking this medicine. Women should inform their doctor if they wish to   become pregnant or think they might be pregnant. There is a potential for serious side effects to an unborn child. Talk to your health care professional or pharmacist for more information. Do not breast-feed an infant while  taking this medicine. You may have vomiting or diarrhea while taking this medicine. Drink water or other fluids as directed. What side effects may I notice from receiving this medicine? Side effects that you should report to your doctor or health care professional as soon as possible: -allergic reactions like skin rash, itching or hives, swelling of the face, lips, or tongue -breathing problems -changes in hearing -changes in vision -fast, irregular heartbeat -feeling faint or lightheaded, falls -pain, tingling, numbness in the hands or feet -seizures -swelling of the ankles, feet, hands -unusual bleeding or bruising -unusually weak or tired -vomiting Side effects that usually do not require medical attention (report to your doctor or health care professional if they continue or are bothersome): -changes in emotions or moods -constipation -diarrhea -loss of appetite -headache -irritation at site where injected -nausea This list may not describe all possible side effects. Call your doctor for medical advice about side effects. You may report side effects to FDA at 1-800-FDA-1088. Where should I keep my medicine? This drug is given in a hospital or clinic and will not be stored at home. NOTE: This sheet is a summary. It may not cover all possible information. If you have questions about this medicine, talk to your doctor, pharmacist, or health care provider.  2012, [employer] Standard. (11/11/2010 11:42:36 AM)Bendamustine Injection What is this medicine? BENDAMUSTINE (BEN da MUS teen) is an anti-cancer drug used to treat a certain type of leukemia. This medicine may be used for other purposes; ask your health care provider or pharmacist if you have questions. What should I tell my health care provider before I take this medicine? They need to know if you have any of these conditions: -kidney disease -liver disease -an unusual or allergic reaction to bendamustine, mannitol, other  medicines, foods, dyes, or preservatives -pregnant or trying to get pregnant -breast-feeding How should I use this medicine? This medicine is for infusion into a vein. It is given by a health care professional in a hospital or clinic setting. Talk to your pediatrician regarding the use of this medicine in children. Special care may be needed. Overdosage: If you think you have taken too much of this medicine contact a poison control center or emergency room at once. NOTE: This medicine is only for you. Do not share this medicine with others. What if I miss a dose? It is important not to miss your dose. Call your doctor or health care professional if you are unable to keep an appointment. What may interact with this medicine? Do not take this medicine with any of the following medications: -clozapine This medicine may also interact with the following medications: -atazanavir -cimetidine -ciprofloxacin -enoxacin -fluvoxamine -medicines for seizures like carbamazepine and phenobarbital -mexiletine -rifampin -tacrine -thiabendazole -zileuton This list may not describe all possible interactions. Give your health care provider a list of all the medicines, herbs, non-prescription drugs, or dietary supplements you use. Also tell them if you smoke, drink alcohol, or use illegal drugs. Some items may interact with your medicine. What should I watch for while using this medicine? Your condition will be monitored carefully while you are receiving this medicine. This drug may make you feel generally unwell. This is not uncommon, as chemotherapy can affect healthy cells as well as cancer cells.  Report any side effects. Continue your course of treatment even though you feel ill unless your doctor tells you to stop. Call your doctor or health care professional for advice if you get a fever, chills or sore throat, or other symptoms of a cold or flu. Do not treat yourself. This drug decreases your body's  ability to fight infections. Try to avoid being around people who are sick. This medicine may increase your risk to bruise or bleed. Call your doctor or health care professional if you notice any unusual bleeding. Be careful brushing and flossing your teeth or using a toothpick because you may get an infection or bleed more easily. If you have any dental work done, tell your dentist you are receiving this medicine. Avoid taking products that contain aspirin, acetaminophen, ibuprofen, naproxen, or ketoprofen unless instructed by your doctor. These medicines may hide a fever. Do not become pregnant while taking this medicine. Women should inform their doctor if they wish to become pregnant or think they might be pregnant. There is a potential for serious side effects to an unborn child. Men should inform their doctors if they wish to father a child. This medicine may lower sperm counts. Talk to your health care professional or pharmacist for more information. Do not breast-feed an infant while taking this medicine. What side effects may I notice from receiving this medicine? Side effects that you should report to your doctor or health care professional as soon as possible: -allergic reactions like skin rash, itching or hives, swelling of the face, lips, or tongue -low blood counts - this medicine may decrease the number of white blood cells, red blood cells and platelets. You may be at increased risk for infections and bleeding. -signs of infection - fever or chills, cough, sore throat, pain or difficulty passing urine -signs of decreased platelets or bleeding - bruising, pinpoint red spots on the skin, black, tarry stools, blood in the urine -signs of decreased red blood cells - unusually weak or tired, fainting spells, lightheadedness -trouble passing urine or change in the amount of urine Side effects that usually do not require medical attention (report to your doctor or health care professional if  they continue or are bothersome): -diarrhea This list may not describe all possible side effects. Call your doctor for medical advice about side effects. You may report side effects to FDA at 1-800-FDA-1088. Where should I keep my medicine? This drug is given in a hospital or clinic and will not be stored at home. NOTE: This sheet is a summary. It may not cover all possible information. If you have questions about this medicine, talk to your doctor, pharmacist, or health care provider.  2012, [employer]vier/[employer] Standard. (01/08/2008 3:58:27 PM)

## 2012-09-21 ENCOUNTER — Other Ambulatory Visit: Payer: Self-pay | Admitting: *Deleted

## 2012-09-21 ENCOUNTER — Ambulatory Visit (HOSPITAL_BASED_OUTPATIENT_CLINIC_OR_DEPARTMENT_OTHER): Payer: Medicare Other

## 2012-09-21 VITALS — BP 132/69 | HR 106 | Temp 98.2°F | Resp 20

## 2012-09-21 DIAGNOSIS — Z5111 Encounter for antineoplastic chemotherapy: Secondary | ICD-10-CM

## 2012-09-21 DIAGNOSIS — C9000 Multiple myeloma not having achieved remission: Secondary | ICD-10-CM

## 2012-09-21 DIAGNOSIS — Z452 Encounter for adjustment and management of vascular access device: Secondary | ICD-10-CM

## 2012-09-21 DIAGNOSIS — T82598A Other mechanical complication of other cardiac and vascular devices and implants, initial encounter: Secondary | ICD-10-CM

## 2012-09-21 LAB — COMPREHENSIVE METABOLIC PANEL
ALT: 26 U/L (ref 0–35)
Albumin: 4.1 g/dL (ref 3.5–5.2)
Alkaline Phosphatase: 92 U/L (ref 39–117)
CO2: 24 mEq/L (ref 19–32)
Potassium: 4.3 mEq/L (ref 3.5–5.3)
Sodium: 139 mEq/L (ref 135–145)
Total Bilirubin: 0.3 mg/dL (ref 0.3–1.2)
Total Protein: 6.5 g/dL (ref 6.0–8.3)

## 2012-09-21 LAB — KAPPA/LAMBDA LIGHT CHAINS
Kappa:Lambda Ratio: 6.34 — ABNORMAL HIGH (ref 0.26–1.65)
Lambda Free Lght Chn: 0.7 mg/dL (ref 0.57–2.63)

## 2012-09-21 MED ORDER — SODIUM CHLORIDE 0.9 % IV SOLN
Freq: Once | INTRAVENOUS | Status: AC
  Administered 2012-09-21: 15:00:00 via INTRAVENOUS

## 2012-09-21 MED ORDER — SODIUM CHLORIDE 0.9 % IJ SOLN
10.0000 mL | INTRAMUSCULAR | Status: DC | PRN
  Administered 2012-09-21: 10 mL
  Filled 2012-09-21: qty 10

## 2012-09-21 MED ORDER — ONDANSETRON 8 MG/50ML IVPB (CHCC)
8.0000 mg | Freq: Once | INTRAVENOUS | Status: AC
  Administered 2012-09-21: 8 mg via INTRAVENOUS

## 2012-09-21 MED ORDER — BENDAMUSTINE HCL (LYOPHILIZED PWD) CHEMO INJECTION 100MG
60.0000 mg/m2 | Freq: Once | INTRAVENOUS | Status: AC
  Administered 2012-09-21: 100 mg via INTRAVENOUS
  Filled 2012-09-21: qty 20

## 2012-09-21 MED ORDER — LORAZEPAM 0.5 MG PO TABS
0.5000 mg | ORAL_TABLET | Freq: Once | ORAL | Status: AC
  Administered 2012-09-21: 0.5 mg via ORAL

## 2012-09-21 MED ORDER — DEXAMETHASONE SODIUM PHOSPHATE 10 MG/ML IJ SOLN
10.0000 mg | Freq: Once | INTRAMUSCULAR | Status: AC
  Administered 2012-09-21: 10 mg via INTRAVENOUS

## 2012-09-21 MED ORDER — OXYCODONE HCL 5 MG PO TABS: ORAL_TABLET | ORAL | Status: DC

## 2012-09-21 MED ORDER — ALTEPLASE 2 MG IJ SOLR
2.0000 mg | Freq: Once | INTRAMUSCULAR | Status: AC | PRN
  Administered 2012-09-21: 2 mg
  Filled 2012-09-21: qty 2

## 2012-09-21 MED ORDER — HEPARIN SOD (PORK) LOCK FLUSH 100 UNIT/ML IV SOLN
500.0000 [IU] | Freq: Once | INTRAVENOUS | Status: AC | PRN
  Administered 2012-09-21: 500 [IU]
  Filled 2012-09-21: qty 5

## 2012-09-21 NOTE — Progress Notes (Signed)
This office note has been dictated.

## 2012-09-21 NOTE — Progress Notes (Signed)
Placed order for port dye study due to frequent cathflow use when accessed per Dr Myna Hidalgo.

## 2012-09-21 NOTE — Patient Instructions (Signed)
Bendamustine Injection What is this medicine? BENDAMUSTINE (BEN da MUS teen) is an anti-cancer drug used to treat a certain type of leukemia. This medicine may be used for other purposes; ask your health care provider or pharmacist if you have questions. What should I tell my health care provider before I take this medicine? They need to know if you have any of these conditions: -kidney disease -liver disease -an unusual or allergic reaction to bendamustine, mannitol, other medicines, foods, dyes, or preservatives -pregnant or trying to get pregnant -breast-feeding How should I use this medicine? This medicine is for infusion into a vein. It is given by a health care professional in a hospital or clinic setting. Talk to your pediatrician regarding the use of this medicine in children. Special care may be needed. Overdosage: If you think you have taken too much of this medicine contact a poison control center or emergency room at once. NOTE: This medicine is only for you. Do not share this medicine with others. What if I miss a dose? It is important not to miss your dose. Call your doctor or health care professional if you are unable to keep an appointment. What may interact with this medicine? Do not take this medicine with any of the following medications: -clozapine This medicine may also interact with the following medications: -atazanavir -cimetidine -ciprofloxacin -enoxacin -fluvoxamine -medicines for seizures like carbamazepine and phenobarbital -mexiletine -rifampin -tacrine -thiabendazole -zileuton This list may not describe all possible interactions. Give your health care provider a list of all the medicines, herbs, non-prescription drugs, or dietary supplements you use. Also tell them if you smoke, drink alcohol, or use illegal drugs. Some items may interact with your medicine. What should I watch for while using this medicine? Your condition will be monitored carefully  while you are receiving this medicine. This drug may make you feel generally unwell. This is not uncommon, as chemotherapy can affect healthy cells as well as cancer cells. Report any side effects. Continue your course of treatment even though you feel ill unless your doctor tells you to stop. Call your doctor or health care professional for advice if you get a fever, chills or sore throat, or other symptoms of a cold or flu. Do not treat yourself. This drug decreases your body's ability to fight infections. Try to avoid being around people who are sick. This medicine may increase your risk to bruise or bleed. Call your doctor or health care professional if you notice any unusual bleeding. Be careful brushing and flossing your teeth or using a toothpick because you may get an infection or bleed more easily. If you have any dental work done, tell your dentist you are receiving this medicine. Avoid taking products that contain aspirin, acetaminophen, ibuprofen, naproxen, or ketoprofen unless instructed by your doctor. These medicines may hide a fever. Do not become pregnant while taking this medicine. Women should inform their doctor if they wish to become pregnant or think they might be pregnant. There is a potential for serious side effects to an unborn child. Men should inform their doctors if they wish to father a child. This medicine may lower sperm counts. Talk to your health care professional or pharmacist for more information. Do not breast-feed an infant while taking this medicine. What side effects may I notice from receiving this medicine? Side effects that you should report to your doctor or health care professional as soon as possible: -allergic reactions like skin rash, itching or hives, swelling of the face,  lips, or tongue -low blood counts - this medicine may decrease the number of white blood cells, red blood cells and platelets. You may be at increased risk for infections and  bleeding. -signs of infection - fever or chills, cough, sore throat, pain or difficulty passing urine -signs of decreased platelets or bleeding - bruising, pinpoint red spots on the skin, black, tarry stools, blood in the urine -signs of decreased red blood cells - unusually weak or tired, fainting spells, lightheadedness -trouble passing urine or change in the amount of urine Side effects that usually do not require medical attention (report to your doctor or health care professional if they continue or are bothersome): -diarrhea This list may not describe all possible side effects. Call your doctor for medical advice about side effects. You may report side effects to FDA at 1-800-FDA-1088. Where should I keep my medicine? This drug is given in a hospital or clinic and will not be stored at home. NOTE: This sheet is a summary. It may not cover all possible information. If you have questions about this medicine, talk to your doctor, pharmacist, or health care provider.  2012, [employer] Standard. (01/08/2008 3:58:27 PM)

## 2012-09-21 NOTE — Addendum Note (Signed)
Addended by: Arlan Organ R on: 09/21/2012 05:25 PM   Modules accepted: Orders

## 2012-09-22 NOTE — Progress Notes (Signed)
CC:   Stacie Arbour, MD  DIAGNOSES: 1. Refractory kappa light chain myeloma. 2. Chronic renal insufficiency. 3. Hypogammaglobulinemia.  CURRENT THERAPY: 1. The patient is status post 2 cycles of     bendamustine/Velcade/Decadron. 2. Zometa 3.3 mg IV q.4 weeks. 3. Aranesp 300 mcg subcu for hemoglobin less than 10. 4. IVIG q.4 weeks.  INTERIM HISTORY:  [name] comes in for followup.  She actually has been doing okay.  She drove herself in.  She has tolerated treatment well.  She had a decent Thanksgiving last week.  Her appetite has been okay.  It is hard to really gauge how well we are doing as her labs really have never shown a large monoclonal spike.  When we last checked her kappa light chain, it was 2.26 mg/dL.  When we checked it today, it was 4.44 mg/dL.  She has never had a monoclonal spike in her serum.  She still has a lot of pain issues.  She has a lot of arthritic issues and myalgias.  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  We have her on oxycodone for this, which helps her.  She has had no bleeding.  She has had no fever.  She has had no cough. She does have underlying, I think, emphysema.  PHYSICAL EXAMINATION:  General:  This is a fairly well-developed, well- nourished white female, in no obvious distress.  Vital signs:  Show a temperature of 98, pulse 59, respiratory rate 18, blood pressure 102/56. Weight is 128.  Head and neck:  Normocephalic, atraumatic skull.  There are no ocular or oral lesions.  She has no palpable cervical or supraclavicular lymph nodes.  Lungs:  Clear bilaterally.  Cardiac: Regular rate and rhythm, with a normal S1 and S2.  There are no murmurs, rubs or bruits.  Abdomen:  Soft, with good bowel sounds.  There is no palpable abdominal mass.  There is no fluid wave.  There is no palpable hepatosplenomegaly.  Extremities:  Show no clubbing, cyanosis or edema. She does have chronic changes from past radiation and  muscle calcification.  Neurological:  Shows no focal neurological deficits.  LABORATORY STUDIES:  White cell count is 4.1, hemoglobin 8.9, hematocrit 27.8, platelet count 83,000.  Her BUN and creatinine are 26 and 1.8.  Calcium 9.7, with an albumin of 4.1.  IMPRESSION:  [name] is a 66 year old white female with kappa light chain myeloma.  One would have to say that this is probably refractory.  We have her on bendamustine/Velcade.  Again, it is hard to say how this is going to do for her.  We will finish up this 4th cycle this month.  I will set her up with a bone marrow test on 11/07/2012.  She did get Aranesp today.  Again, it is hard to say how much we are helping her as it is hard to judge her response by blood work.  We will go ahead and finish up her cycle this month.  __________I will get her set up with the bone marrow test on 11/07/2012.  We will plan to get her back to see me a week after the bone marrow test.  Overall, [name] has really done a good job this year.  I think she has only been hospitalized once with profound anemia and, I think, some GI bleeding.    ______________________________ Josph Macho, M.D. PRE/MEDQ  D:  09/21/2012  T:  09/22/2012  Job:  [id number]

## 2012-09-27 ENCOUNTER — Other Ambulatory Visit (HOSPITAL_BASED_OUTPATIENT_CLINIC_OR_DEPARTMENT_OTHER): Payer: Medicare Other | Admitting: Lab

## 2012-09-27 ENCOUNTER — Ambulatory Visit (HOSPITAL_BASED_OUTPATIENT_CLINIC_OR_DEPARTMENT_OTHER): Payer: Medicare Other

## 2012-09-27 ENCOUNTER — Other Ambulatory Visit: Payer: Medicare Other | Admitting: Lab

## 2012-09-27 ENCOUNTER — Ambulatory Visit: Payer: Medicare Other | Admitting: Hematology & Oncology

## 2012-09-27 ENCOUNTER — Ambulatory Visit: Payer: Medicare Other

## 2012-09-27 VITALS — BP 92/57 | HR 97 | Temp 97.6°F | Resp 20

## 2012-09-27 DIAGNOSIS — C9000 Multiple myeloma not having achieved remission: Secondary | ICD-10-CM

## 2012-09-27 DIAGNOSIS — T82598A Other mechanical complication of other cardiac and vascular devices and implants, initial encounter: Secondary | ICD-10-CM

## 2012-09-27 DIAGNOSIS — Z5112 Encounter for antineoplastic immunotherapy: Secondary | ICD-10-CM

## 2012-09-27 LAB — BASIC METABOLIC PANEL - CANCER CENTER ONLY
Calcium: 8.7 mg/dL (ref 8.0–10.3)
Creat: 1.9 mg/dl — ABNORMAL HIGH (ref 0.6–1.2)

## 2012-09-27 LAB — CBC WITH DIFFERENTIAL (CANCER CENTER ONLY)
BASO%: 0.8 % (ref 0.0–2.0)
EOS%: 4.2 % (ref 0.0–7.0)
HCT: 28.3 % — ABNORMAL LOW (ref 34.8–46.6)
LYMPH#: 0.3 10*3/uL — ABNORMAL LOW (ref 0.9–3.3)
LYMPH%: 8.6 % — ABNORMAL LOW (ref 14.0–48.0)
MCHC: 31.8 g/dL — ABNORMAL LOW (ref 32.0–36.0)
MCV: 104 fL — ABNORMAL HIGH (ref 81–101)
NEUT%: 71.1 % (ref 39.6–80.0)
RDW: 20 % — ABNORMAL HIGH (ref 11.1–15.7)

## 2012-09-27 MED ORDER — ONDANSETRON HCL 8 MG PO TABS
8.0000 mg | ORAL_TABLET | Freq: Once | ORAL | Status: AC
  Administered 2012-09-27: 8 mg via ORAL

## 2012-09-27 MED ORDER — BORTEZOMIB CHEMO SQ INJECTION 3.5 MG (2.5MG/ML)
1.3000 mg/m2 | Freq: Once | INTRAMUSCULAR | Status: AC
  Administered 2012-09-27: 2 mg via SUBCUTANEOUS
  Filled 2012-09-27: qty 2

## 2012-09-27 MED ORDER — SODIUM CHLORIDE 0.9 % IV SOLN
1000.0000 mL | Freq: Once | INTRAVENOUS | Status: AC
  Administered 2012-09-27: 13:00:00 via INTRAVENOUS

## 2012-09-27 MED ORDER — HEPARIN SOD (PORK) LOCK FLUSH 100 UNIT/ML IV SOLN
500.0000 [IU] | Freq: Once | INTRAVENOUS | Status: AC
  Administered 2012-09-27: 500 [IU] via INTRAVENOUS
  Filled 2012-09-27: qty 5

## 2012-09-27 MED ORDER — SODIUM CHLORIDE 0.9 % IJ SOLN
10.0000 mL | INTRAMUSCULAR | Status: DC | PRN
  Administered 2012-09-27: 10 mL via INTRAVENOUS
  Filled 2012-09-27: qty 10

## 2012-09-27 NOTE — Patient Instructions (Signed)

## 2012-09-28 ENCOUNTER — Ambulatory Visit (HOSPITAL_COMMUNITY): Payer: Medicare Other

## 2012-09-29 ENCOUNTER — Encounter (HOSPITAL_COMMUNITY): Payer: Self-pay | Admitting: Pharmacy Technician

## 2012-10-03 ENCOUNTER — Ambulatory Visit (HOSPITAL_COMMUNITY)
Admission: RE | Admit: 2012-10-03 | Discharge: 2012-10-03 | Disposition: A | Discharge status: Home / Self Care | Payer: Medicare Other | Source: Ambulatory Visit | Attending: Hematology & Oncology | Admitting: Hematology & Oncology

## 2012-10-03 DIAGNOSIS — Z452 Encounter for adjustment and management of vascular access device: Secondary | ICD-10-CM | POA: Insufficient documentation

## 2012-10-03 DIAGNOSIS — C9000 Multiple myeloma not having achieved remission: Secondary | ICD-10-CM | POA: Insufficient documentation

## 2012-10-03 MED ORDER — HEPARIN SOD (PORK) LOCK FLUSH 100 UNIT/ML IV SOLN: 500.0000 [IU] | Freq: Once | INTRAVENOUS | Status: DC

## 2012-10-04 ENCOUNTER — Ambulatory Visit (HOSPITAL_BASED_OUTPATIENT_CLINIC_OR_DEPARTMENT_OTHER): Payer: Medicare Other

## 2012-10-04 ENCOUNTER — Other Ambulatory Visit (HOSPITAL_BASED_OUTPATIENT_CLINIC_OR_DEPARTMENT_OTHER): Payer: Medicare Other | Admitting: Lab

## 2012-10-04 ENCOUNTER — Encounter (HOSPITAL_COMMUNITY)
Admission: RE | Admit: 2012-10-04 | Discharge: 2012-10-04 | Disposition: A | Discharge status: Home / Self Care | Payer: Medicare Other | Source: Ambulatory Visit | Attending: Hematology & Oncology | Admitting: Hematology & Oncology

## 2012-10-04 VITALS — BP 96/60 | HR 87 | Temp 97.8°F | Resp 18

## 2012-10-04 DIAGNOSIS — C9000 Multiple myeloma not having achieved remission: Secondary | ICD-10-CM

## 2012-10-04 DIAGNOSIS — N039 Chronic nephritic syndrome with unspecified morphologic changes: Secondary | ICD-10-CM

## 2012-10-04 DIAGNOSIS — N189 Chronic kidney disease, unspecified: Secondary | ICD-10-CM

## 2012-10-04 DIAGNOSIS — D631 Anemia in chronic kidney disease: Secondary | ICD-10-CM

## 2012-10-04 LAB — BASIC METABOLIC PANEL - CANCER CENTER ONLY
CO2: 26 mEq/L (ref 18–33)
Chloride: 100 mEq/L (ref 98–108)
Sodium: 143 mEq/L (ref 128–145)

## 2012-10-04 LAB — CBC WITH DIFFERENTIAL (CANCER CENTER ONLY)
BASO%: 0.5 % (ref 0.0–2.0)
EOS%: 3.1 % (ref 0.0–7.0)
HCT: 26 % — ABNORMAL LOW (ref 34.8–46.6)
LYMPH#: 0.4 10*3/uL — ABNORMAL LOW (ref 0.9–3.3)
MONO#: 0.7 10*3/uL (ref 0.1–0.9)
NEUT#: 2.6 10*3/uL (ref 1.5–6.5)
Platelets: 59 10*3/uL — ABNORMAL LOW (ref 145–400)
RDW: 20.6 % — ABNORMAL HIGH (ref 11.1–15.7)
WBC: 3.9 10*3/uL (ref 3.9–10.0)

## 2012-10-04 LAB — PREPARE RBC (CROSSMATCH)

## 2012-10-04 MED ORDER — ACETAMINOPHEN 325 MG PO TABS
650.0000 mg | ORAL_TABLET | Freq: Once | ORAL | Status: AC
  Administered 2012-10-04: 650 mg via ORAL

## 2012-10-04 MED ORDER — DARBEPOETIN ALFA-POLYSORBATE 300 MCG/0.6ML IJ SOLN
300.0000 ug | Freq: Once | INTRAMUSCULAR | Status: AC
  Administered 2012-10-04: 300 ug via SUBCUTANEOUS

## 2012-10-04 MED ORDER — ZOLEDRONIC ACID 4 MG/5ML IV CONC
3.3000 mg | Freq: Once | INTRAVENOUS | Status: AC
  Administered 2012-10-04: 3.3 mg via INTRAVENOUS
  Filled 2012-10-04: qty 4.13

## 2012-10-04 MED ORDER — DIPHENHYDRAMINE HCL 25 MG PO CAPS
25.0000 mg | ORAL_CAPSULE | Freq: Once | ORAL | Status: AC
  Administered 2012-10-04: 25 mg via ORAL

## 2012-10-04 MED ORDER — HEPARIN SOD (PORK) LOCK FLUSH 100 UNIT/ML IV SOLN
500.0000 [IU] | Freq: Once | INTRAVENOUS | Status: AC
  Administered 2012-10-04: 500 [IU] via INTRAVENOUS
  Filled 2012-10-04: qty 5

## 2012-10-04 MED ORDER — SODIUM CHLORIDE 0.9 % IJ SOLN
10.0000 mL | INTRAMUSCULAR | Status: DC | PRN
  Administered 2012-10-04: 10 mL via INTRAVENOUS
  Filled 2012-10-04: qty 10

## 2012-10-04 MED ORDER — IMMUNE GLOBULIN (HUMAN) 10 GM/100ML IV SOLN
0.8000 g/kg | Freq: Once | INTRAVENOUS | Status: AC
  Administered 2012-10-04: 50 g via INTRAVENOUS
  Filled 2012-10-04: qty 500

## 2012-10-04 NOTE — Patient Instructions (Signed)
Darbepoetin Alfa injection What is this medicine? DARBEPOETIN ALFA (dar be POE e tin AL fa) helps your body make more red blood cells. It is used to treat anemia caused by chronic kidney failure and chemotherapy. This medicine may be used for other purposes; ask your health care provider or pharmacist if you have questions. What should I tell my health care provider before I take this medicine? They need to know if you have any of these conditions: -blood clotting disorders or history of blood clots -cancer patient not on chemotherapy -cystic fibrosis -heart disease, such as angina, heart failure, or a history of a heart attack -hemoglobin level of 12 g/dL or greater -high blood pressure -low levels of folate, iron, or vitamin B12 -seizures -an unusual or allergic reaction to darbepoetin, erythropoietin, albumin, hamster proteins, latex, other medicines, foods, dyes, or preservatives -pregnant or trying to get pregnant -breast-feeding How should I use this medicine? This medicine is for injection into a vein or under the skin. It is usually given by a health care professional in a hospital or clinic setting. If you get this medicine at home, you will be taught how to prepare and give this medicine. Do not shake the solution before you withdraw a dose. Use exactly as directed. Take your medicine at regular intervals. Do not take your medicine more often than directed. It is important that you put your used needles and syringes in a special sharps container. Do not put them in a trash can. If you do not have a sharps container, call your pharmacist or healthcare provider to get one. Talk to your pediatrician regarding the use of this medicine in children. While this medicine may be used in children as young as 1 year for selected conditions, precautions do apply. Overdosage: If you think you have taken too much of this medicine contact a poison control center or emergency room at once. NOTE:  This medicine is only for you. Do not share this medicine with others. What if I miss a dose? If you miss a dose, take it as soon as you can. If it is almost time for your next dose, take only that dose. Do not take double or extra doses. What may interact with this medicine? Do not take this medicine with any of the following medications: -epoetin alfa This list may not describe all possible interactions. Give your health care provider a list of all the medicines, herbs, non-prescription drugs, or dietary supplements you use. Also tell them if you smoke, drink alcohol, or use illegal drugs. Some items may interact with your medicine. What should I watch for while using this medicine? Visit your prescriber or health care professional for regular checks on your progress and for the needed blood tests and blood pressure measurements. It is especially important for the doctor to make sure your hemoglobin level is in the desired range, to limit the risk of potential side effects and to give you the best benefit. Keep all appointments for any recommended tests. Check your blood pressure as directed. Ask your doctor what your blood pressure should be and when you should contact him or her. As your body makes more red blood cells, you may need to take iron, folic acid, or vitamin B supplements. Ask your doctor or health care provider which products are right for you. If you have kidney disease continue dietary restrictions, even though this medication can make you feel better. Talk with your doctor or health care professional about the   foods you eat and the vitamins that you take. What side effects may I notice from receiving this medicine? Side effects that you should report to your doctor or health care professional as soon as possible: -allergic reactions like skin rash, itching or hives, swelling of the face, lips, or tongue -breathing problems -changes in vision -chest pain -confusion, trouble speaking  or understanding -feeling faint or lightheaded, falls -high blood pressure -muscle aches or pains -pain, swelling, warmth in the leg -rapid weight gain -severe headaches -sudden numbness or weakness of the face, arm or leg -trouble walking, dizziness, loss of balance or coordination -seizures (convulsions) -swelling of the ankles, feet, hands -unusually weak or tired Side effects that usually do not require medical attention (report to your doctor or health care professional if they continue or are bothersome): -diarrhea -fever, chills (flu-like symptoms) -headaches -nausea, vomiting -redness, stinging, or swelling at site where injected This list may not describe all possible side effects. Call your doctor for medical advice about side effects. You may report side effects to FDA at 1-800-FDA-1088. Where should I keep my medicine? Keep out of the reach of children. Store in a refrigerator between 2 and 8 degrees C (36 and 46 degrees F). Do not freeze. Do not shake. Throw away any unused portion if using a single-dose vial. Throw away any unused medicine after the expiration date. NOTE: This sheet is a summary. It may not cover all possible information. If you have questions about this medicine, talk to your doctor, pharmacist, or health care provider.  2012, Elsevier/Gold Standard. (09/17/2008 10:23:57 AM)Immune Globulin Injection What is this medicine? IMMUNE GLOBULIN (im MUNE GLOB yoo lin) helps to prevent or reduce the severity of certain infections in patients who are at risk. This medicine is collected from the pooled blood of many donors. It is used to treat immune system problems, thrombocytopenia, and Kawasaki syndrome. This medicine may be used for other purposes; ask your health care provider or pharmacist if you have questions. What should I tell my health care provider before I take this medicine? They need to know if you have any of these  conditions: - diabetes - extremely low or no immune antibodies in the blood - heart disease - history of blood clots - hyperprolinemia - infection in the blood, sepsis - kidney disease - taking medicine that may change kidney function - ask your health care provider about your medicine - an unusual or allergic reaction to human immune globulin, albumin, maltose, sucrose, polysorbate 80, other medicines, foods, dyes, or preservatives - pregnant or trying to get pregnant - breast-feeding How should I use this medicine? This medicine is for injection into a muscle or infusion into a vein or skin. It is usually given by a health care professional in a hospital or clinic setting. In rare cases, some brands of this medicine might be given at home. You will be taught how to give this medicine. Use exactly as directed. Take your medicine at regular intervals. Do not take your medicine more often than directed. Talk to your pediatrician regarding the use of this medicine in children. Special care may be needed. Overdosage: If you think you have taken too much of this medicine contact a poison control center or emergency room at once. NOTE: This medicine is only for you. Do not share this medicine with others. What if I miss a dose? It is important not to miss your dose. Call your doctor or health care professional if you are  unable to keep an appointment. If you give yourself the medicine and you miss a dose, take it as soon as you can. If it is almost time for your next dose, take only that dose. Do not take double or extra doses. What may interact with this medicine? -aspirin and aspirin-like medicines -cisplatin -cyclosporine -medicines for infection like acyclovir, adefovir, amphotericin B, bacitracin, cidofovir, foscarnet, ganciclovir, gentamicin, pentamidine, vancomycin -NSAIDS, medicines for pain and inflammation, like ibuprofen or naproxen -pamidronate -vaccines -zoledronic  acid This list may not describe all possible interactions. Give your health care provider a list of all the medicines, herbs, non-prescription drugs, or dietary supplements you use. Also tell them if you smoke, drink alcohol, or use illegal drugs. Some items may interact with your medicine. What should I watch for while using this medicine? Your condition will be monitored carefully while you are receiving this medicine. This medicine is made from pooled blood donations of many different people. It may be possible to pass an infection in this medicine. However, the donors are screened for infections and all products are tested for HIV and hepatitis. The medicine is treated to kill most or all bacteria and viruses. Talk to your doctor about the risks and benefits of this medicine. Do not have vaccinations for at least 14 days before, or until at least 3 months after receiving this medicine. What side effects may I notice from receiving this medicine? Side effects that you should report to your doctor or health care professional as soon as possible: -allergic reactions like skin rash, itching or hives, swelling of the face, lips, or tongue -breathing problems -chest pain or tightness -fever, chills -headache with nausea, vomiting -neck pain or difficulty moving neck -pain when moving eyes -pain, swelling, warmth in the leg -problems with balance, talking, walking -sudden weight gain -swelling of the ankles, feet, hands -trouble passing urine or change in the amount of urine Side effects that usually do not require medical attention (report to your doctor or health care professional if they continue or are bothersome): -dizzy, drowsy -flushing -increased sweating -leg cramps -muscle aches and pains -pain at site where injected This list may not describe all possible side effects. Call your doctor for medical advice about side effects. You may report side effects to FDA at  1-800-FDA-1088. Where should I keep my medicine? Keep out of the reach of children. This drug is usually given in a hospital or clinic and will not be stored at home. In rare cases, some brands of this medicine may be given at home. If you are using this medicine at home, you will be instructed on how to store this medicine. Throw away any unused medicine after the expiration date on the label. NOTE: This sheet is a summary. It may not cover all possible information. If you have questions about this medicine, talk to your doctor, pharmacist, or health care provider.  2012, Elsevier/Gold Standard. (12/25/2008 11:44:49 AM)

## 2012-10-05 ENCOUNTER — Other Ambulatory Visit: Payer: Self-pay | Admitting: *Deleted

## 2012-10-05 ENCOUNTER — Ambulatory Visit (HOSPITAL_BASED_OUTPATIENT_CLINIC_OR_DEPARTMENT_OTHER): Payer: Medicare Other

## 2012-10-05 VITALS — BP 106/67 | HR 62 | Temp 98.0°F | Resp 20

## 2012-10-05 DIAGNOSIS — C9000 Multiple myeloma not having achieved remission: Secondary | ICD-10-CM

## 2012-10-05 DIAGNOSIS — D649 Anemia, unspecified: Secondary | ICD-10-CM

## 2012-10-05 LAB — PREPARE RBC (CROSSMATCH)

## 2012-10-05 MED ORDER — SODIUM CHLORIDE 0.9 % IJ SOLN
10.0000 mL | INTRAMUSCULAR | Status: AC | PRN
  Administered 2012-10-05: 10 mL
  Filled 2012-10-05: qty 10

## 2012-10-05 MED ORDER — LORAZEPAM 0.5 MG PO TABS: 0.5000 mg | ORAL_TABLET | Freq: Three times a day (TID) | ORAL | Status: DC

## 2012-10-05 MED ORDER — SODIUM CHLORIDE 0.9 % IV SOLN
250.0000 mL | Freq: Once | INTRAVENOUS | Status: AC
  Administered 2012-10-05: 250 mL via INTRAVENOUS

## 2012-10-05 MED ORDER — ACYCLOVIR 400 MG PO TABS: 400.0000 mg | ORAL_TABLET | Freq: Two times a day (BID) | ORAL | Status: DC

## 2012-10-05 MED ORDER — LORAZEPAM 0.5 MG PO TABS
0.5000 mg | ORAL_TABLET | Freq: Once | ORAL | Status: AC
  Administered 2012-10-05: 0.5 mg via ORAL

## 2012-10-05 MED ORDER — HEPARIN SOD (PORK) LOCK FLUSH 100 UNIT/ML IV SOLN
500.0000 [IU] | Freq: Every day | INTRAVENOUS | Status: AC | PRN
  Administered 2012-10-05: 500 [IU]
  Filled 2012-10-05: qty 5

## 2012-10-05 MED ORDER — ACETAMINOPHEN 325 MG PO TABS
650.0000 mg | ORAL_TABLET | Freq: Once | ORAL | Status: AC
  Administered 2012-10-05: 650 mg via ORAL

## 2012-10-05 NOTE — Patient Instructions (Signed)
Blood Transfusion Information WHAT IS A BLOOD TRANSFUSION? A transfusion is the replacement of blood or some of its parts. Blood is made up of multiple cells which provide different functions.  Red blood cells carry oxygen and are used for blood loss replacement.  White blood cells fight against infection.  Platelets control bleeding.  Plasma helps clot blood.  Other blood products are available for specialized needs, such as hemophilia or other clotting disorders. BEFORE THE TRANSFUSION  Who gives blood for transfusions?   You may be able to donate blood to be used at a later date on yourself (autologous donation).  Relatives can be asked to donate blood. This is generally not any safer than if you have received blood from a stranger. The same precautions are taken to ensure safety when a relative's blood is donated.  Healthy volunteers who are fully evaluated to make sure their blood is safe. This is blood bank blood. Transfusion therapy is the safest it has ever been in the practice of medicine. Before blood is taken from a donor, a complete history is taken to make sure that person has no history of diseases nor engages in risky social behavior (examples are intravenous drug use or sexual activity with multiple partners). The donor's travel history is screened to minimize risk of transmitting infections, such as malaria. The donated blood is tested for signs of infectious diseases, such as HIV and hepatitis. The blood is then tested to be sure it is compatible with you in order to minimize the chance of a transfusion reaction. If you or a relative donates blood, this is often done in anticipation of surgery and is not appropriate for emergency situations. It takes many days to process the donated blood. RISKS AND COMPLICATIONS Although transfusion therapy is very safe and saves many lives, the main dangers of transfusion include:   Getting an infectious disease.  Developing a  transfusion reaction. This is an allergic reaction to something in the blood you were given. Every precaution is taken to prevent this. The decision to have a blood transfusion has been considered carefully by your caregiver before blood is given. Blood is not given unless the benefits outweigh the risks. AFTER THE TRANSFUSION  Right after receiving a blood transfusion, you will usually feel much better and more energetic. This is especially true if your red blood cells have gotten low (anemic). The transfusion raises the level of the red blood cells which carry oxygen, and this usually causes an energy increase.  The nurse administering the transfusion will monitor you carefully for complications. HOME CARE INSTRUCTIONS  No special instructions are needed after a transfusion. You may find your energy is better. Speak with your caregiver about any limitations on activity for underlying diseases you may have. SEEK MEDICAL CARE IF:   Your condition is not improving after your transfusion.  You develop redness or irritation at the intravenous (IV) site. SEEK IMMEDIATE MEDICAL CARE IF:  Any of the following symptoms occur over the next 12 hours:  Shaking chills.  You have a temperature by mouth above 102 F (38.9 C), not controlled by medicine.  Chest, back, or muscle pain.  People around you feel you are not acting correctly or are confused.  Shortness of breath or difficulty breathing.  Dizziness and fainting.  You get a rash or develop hives.  You have a decrease in urine output.  Your urine turns a dark color or changes to pink, red, or brown. Any of the following   symptoms occur over the next 10 days:  You have a temperature by mouth above 102 F (38.9 C), not controlled by medicine.  Shortness of breath.  Weakness after normal activity.  The white part of the eye turns yellow (jaundice).  You have a decrease in the amount of urine or are urinating less often.  Your  urine turns a dark color or changes to pink, red, or brown. Document Released: 10/01/2000 Document Revised: 12/27/2011 Document Reviewed: 05/20/2008 ExitCare Patient Information 2013 [employer].  

## 2012-10-06 LAB — TYPE AND SCREEN
Antibody Screen: NEGATIVE
Unit division: 0

## 2012-10-07 ENCOUNTER — Other Ambulatory Visit: Payer: Self-pay | Admitting: Hematology & Oncology

## 2012-10-08 ENCOUNTER — Encounter (HOSPITAL_COMMUNITY): Payer: Self-pay | Admitting: Emergency Medicine

## 2012-10-08 ENCOUNTER — Emergency Department (HOSPITAL_COMMUNITY)
Admission: EM | Admit: 2012-10-08 | Discharge: 2012-10-08 | Disposition: A | Discharge status: Home / Self Care | Payer: Medicare Other | Attending: Emergency Medicine | Admitting: Emergency Medicine

## 2012-10-08 DIAGNOSIS — F329 Major depressive disorder, single episode, unspecified: Secondary | ICD-10-CM | POA: Insufficient documentation

## 2012-10-08 DIAGNOSIS — N289 Disorder of kidney and ureter, unspecified: Secondary | ICD-10-CM | POA: Insufficient documentation

## 2012-10-08 DIAGNOSIS — F411 Generalized anxiety disorder: Secondary | ICD-10-CM | POA: Insufficient documentation

## 2012-10-08 DIAGNOSIS — R040 Epistaxis: Secondary | ICD-10-CM | POA: Insufficient documentation

## 2012-10-08 DIAGNOSIS — K219 Gastro-esophageal reflux disease without esophagitis: Secondary | ICD-10-CM | POA: Insufficient documentation

## 2012-10-08 DIAGNOSIS — J449 Chronic obstructive pulmonary disease, unspecified: Secondary | ICD-10-CM | POA: Insufficient documentation

## 2012-10-08 DIAGNOSIS — Z79899 Other long term (current) drug therapy: Secondary | ICD-10-CM | POA: Insufficient documentation

## 2012-10-08 DIAGNOSIS — G8929 Other chronic pain: Secondary | ICD-10-CM | POA: Insufficient documentation

## 2012-10-08 DIAGNOSIS — F3289 Other specified depressive episodes: Secondary | ICD-10-CM | POA: Insufficient documentation

## 2012-10-08 DIAGNOSIS — J4489 Other specified chronic obstructive pulmonary disease: Secondary | ICD-10-CM | POA: Insufficient documentation

## 2012-10-08 DIAGNOSIS — E039 Hypothyroidism, unspecified: Secondary | ICD-10-CM | POA: Insufficient documentation

## 2012-10-08 DIAGNOSIS — M810 Age-related osteoporosis without current pathological fracture: Secondary | ICD-10-CM | POA: Insufficient documentation

## 2012-10-08 LAB — CBC
Hemoglobin: 11.5 g/dL — ABNORMAL LOW (ref 12.0–15.0)
MCH: 32.4 pg (ref 26.0–34.0)
MCHC: 32.9 g/dL (ref 30.0–36.0)
Platelets: 59 10*3/uL — ABNORMAL LOW (ref 150–400)
RDW: 20.5 % — ABNORMAL HIGH (ref 11.5–15.5)

## 2012-10-08 MED ORDER — ONDANSETRON 4 MG PO TBDP
4.0000 mg | ORAL_TABLET | Freq: Once | ORAL | Status: AC
  Administered 2012-10-08: 4 mg via ORAL
  Filled 2012-10-08: qty 1

## 2012-10-08 MED ORDER — OXYMETAZOLINE HCL 0.05 % NA SOLN
1.0000 | Freq: Once | NASAL | Status: AC
  Administered 2012-10-08: 1 via NASAL
  Filled 2012-10-08: qty 15

## 2012-10-08 NOTE — ED Provider Notes (Signed)
Complains of bleeding from the left nares onset this morning. Bleeding has stopped since treatment in the emergency department patient also reports diarrhea for the past few days for which she is treated himself with Lomotil she has had 2 episodes of diarrhea today. No blood per rectum  Doug Sou, MD 10/08/12 [phone]

## 2012-10-08 NOTE — ED Provider Notes (Signed)
History     CSN: [id number]  Arrival date & time 10/08/12  7829   First MD Initiated Contact with Patient 10/08/12 1008      Chief Complaint  Patient presents with  . Epistaxis    (Consider location/radiation/quality/duration/timing/severity/associated sxs/prior treatment) Patient is a 66 y.o. female presenting with nosebleeds. The history is provided by the patient.  Epistaxis  This is a new problem. The current episode started 1 to 2 hours ago. The problem occurs constantly. The problem has been gradually improving. The bleeding has been from the left nare. She has tried applying pressure for the symptoms. The treatment provided mild relief. Her past medical history does not include bleeding disorder, colds, sinus problems, allergies, nose-picking or frequent nosebleeds.    Past Medical History  Diagnosis Date  . Myeloma   . Anxiety   . Hypothyroidism   . GERD (gastroesophageal reflux disease)   . Myeloma   . OAB (overactive bladder)   . COPD (chronic obstructive pulmonary disease)   . Osteoporosis   . Depression   . Chronic pain   . Renal insufficiency     Past Surgical History  Procedure Date  . Leg surgery   . Cholecystectomy   . Tonsillectomy   . Breast surgery   . Limbal stem cell transplant   . Abdominal hysterectomy   . Esophagogastroduodenoscopy 06/11/2012    Procedure: ESOPHAGOGASTRODUODENOSCOPY (EGD);  Surgeon: Willis Modena, MD;  Location: [facility];  Service: Endoscopy;  Laterality: N/A;    No family history on file.  History  Substance Use Topics  . Smoking status: Never Smoker   . Smokeless tobacco: Never Used  . Alcohol Use: No    OB History    Grav Para Term Preterm Abortions TAB SAB Ect Mult Living                  Review of Systems  Constitutional: Negative for fever, diaphoresis and activity change.  HENT: Positive for nosebleeds. Negative for congestion and neck pain.   Respiratory: Negative for cough.   Genitourinary:  Negative for dysuria.  Musculoskeletal: Negative for myalgias.  Skin: Negative for color change and wound.  Neurological: Negative for dizziness, light-headedness and headaches.  All other systems reviewed and are negative.    Allergies  Trazodone and nefazodone; Hydromorphone; Tramadol; Morphine and related; and Penicillins  Home Medications   Current Outpatient Rx  Name  Route  Sig  Dispense  Refill  . ACYCLOVIR 400 MG PO TABS   Oral   Take 1 tablet (400 mg total) by mouth 2 (two) times daily.   60 tablet   3   . ALPRAZOLAM 0.25 MG PO TABS      TAKE 1 TABLET BY MOUTH 3 TIMES DAILY AS NEEDED FOR ANXIETY/SLEEP   30 tablet   1   . MAGIC MOUTHWASH   Oral   Take 5 mLs by mouth 4 (four) times daily as needed. [facility] formula--no Simethicone.         Marland Kitchen BORTEZOMIB CHEMO IV INJECTION 3.5 MG   Intravenous   Inject 1.3 mg/m2 into the vein once.         . CELEBREX 100 MG PO CAPS      TAKE 1 CAPSULE BY MOUTH TWICE DAILY   60 capsule   2   . CYCLOBENZAPRINE HCL 5 MG PO TABS   Oral   Take 5 mg by mouth as needed. Muscle spasms         . DETROL  LA 4 MG PO CP24   Oral   Take 4 mg by mouth every morning.          Marland Kitchen DIPHENOXYLATE-ATROPINE 2.5-0.025 MG PO TABS      TAKE 1 TABLET BY MOUTH 4 TIMES DAILY AS NEEDED FOR DIARRHEA OR LOOSE STOOLS   30 tablet   2   . DULOXETINE HCL 30 MG PO CPEP   Oral   Take 30 mg by mouth daily before breakfast.          . ESOMEPRAZOLE MAGNESIUM 40 MG PO CPDR   Oral   Take 40 mg by mouth 2 (two) times daily.          Marland Kitchen FLUTICASONE-SALMETEROL 250-50 MCG/DOSE IN AEPB   Inhalation   Inhale 1 puff into the lungs every 12 (twelve) hours.   60 each   1   . HYDROXYZINE HCL 25 MG PO TABS   Oral   Take 1 tablet (25 mg total) by mouth 2 (two) times daily as needed for itching.   60 tablet   1   . LEVOTHYROXINE SODIUM 50 MCG PO TABS   Oral   Take 50 mcg by mouth daily before breakfast.          . LORAZEPAM 0.5 MG PO TABS    Oral   Take 1 tablet (0.5 mg total) by mouth every 8 (eight) hours.   30 tablet   0   . OLOPATADINE HCL 0.1 % OP SOLN   Both Eyes   Place 1 drop into both eyes 2 (two) times daily.   5 mL   1   . ONDANSETRON HCL 8 MG PO TABS   Oral   Take 8 mg by mouth every 8 (eight) hours as needed. Nausea         . OXYCODONE HCL 5 MG PO TABS      Take 1-2 pills, IF NEEDED, every 6-8 hrs for pain.   90 tablet   0   . PIRBUTEROL ACETATE 200 MCG/INH IN AERB   Inhalation   Inhale 1-2 puffs into the lungs every 12 (twelve) hours as needed. Wheezing/shortness of breath.  Use sparingly.   1 Inhaler   1   . PROMETHAZINE HCL 25 MG PO TABS      TAKE 1/2 TABLET BY MOUTH EVERY 6 HOURS AS NEEDED FOR NAUSEA AND VOMITING   30 tablet   0   . TEMAZEPAM 30 MG PO CAPS   Oral   Take 1 capsule (30 mg total) by mouth at bedtime as needed. Sleep.   30 capsule   3   . VITAMIN D (ERGOCALCIFEROL) 50000 UNITS PO CAPS      TAKE 1 CAPSULE EVERY WEEK   16 capsule   3     BP 127/61  Pulse 90  Temp 98.5 F (36.9 C) (Oral)  Resp 20  SpO2 98%  Physical Exam  Nursing note and vitals reviewed. Constitutional: She is oriented to person, place, and time. She appears well-developed and well-nourished. No distress.  HENT:  Head: Normocephalic and atraumatic.       Anterior nose bleed, mild. No deformity, septal hematoma or bont ttp   Eyes: Conjunctivae normal and EOM are normal.  Neck: Normal range of motion.  Pulmonary/Chest: Effort normal.  Musculoskeletal: Normal range of motion.  Neurological: She is alert and oriented to person, place, and time.  Skin: Skin is warm and dry. No rash noted. She is not diaphoretic.  Psychiatric:  She has a normal mood and affect. Her behavior is normal.    ED Course  EPISTAXIS MANAGEMENT Date/Time: 10/08/2012 11:19 AM Performed by: Jaci Carrel Authorized by: Jaci Carrel Consent: Verbal consent obtained. Risks and benefits: risks, benefits and alternatives  were discussed Consent given by: patient Patient understanding: patient states understanding of the procedure being performed Patient consent: the patient's understanding of the procedure matches consent given Patient identity confirmed: verbally with patient Patient sedated: no Treatment site: left Kiesselbach's area Repair method: silver nitrate Post-procedure assessment: bleeding stopped Treatment complexity: simple Patient tolerance: Patient tolerated the procedure well with no immediate complications.   (including critical care time)  Labs Reviewed  CBC - Abnormal; Notable for the following:    WBC 3.9 (*)     RBC 3.55 (*)     Hemoglobin 11.5 (*)     HCT 35.0 (*)     RDW 20.5 (*)     Platelets 59 (*)     All other components within normal limits   No results found.   No diagnosis found.   MDM  Pt is a 66yo F w a hx of myeloma that presented to ER c/o epistaxis, since resolved. Cauterization performed with silver nitrate. CBC ordered with good hgb. Pt stable in NAD BP 127/61  Pulse 90  Temp 98.5 F (36.9 C) (Oral)  Resp 20  SpO2 98%  Conservative home therapies and return precautions discussed, questions answered. Pt seen w attending Dr. Ethelda Chick who agrees w treatment plan.         Jaci Carrel, [place] 10/08/12 1139

## 2012-10-08 NOTE — ED Notes (Signed)
Pt from home, nose bleed that started around 0830. Has slowed down since then after several boxes of tissue.

## 2012-10-08 NOTE — ED Notes (Signed)
NUU:[id number]<GU> Expected date:10/08/12<BR> Expected time: 9:36 AM<BR> Means of arrival:Ambulance<BR> Comments:<BR> Nose Bleed

## 2012-10-08 NOTE — ED Provider Notes (Signed)
Medical screening examination/treatment/procedure(s) were conducted as a shared visit with non-physician practitioner(s) and myself.  I personally evaluated the patient during the encounter  Doug Sou, MD 10/08/12 [phone]

## 2012-10-09 ENCOUNTER — Other Ambulatory Visit: Payer: Self-pay | Admitting: Hematology & Oncology

## 2012-10-12 ENCOUNTER — Other Ambulatory Visit: Payer: Self-pay | Admitting: Hematology & Oncology

## 2012-10-14 ENCOUNTER — Other Ambulatory Visit: Payer: Self-pay | Admitting: Hematology & Oncology

## 2012-10-16 ENCOUNTER — Other Ambulatory Visit: Payer: Self-pay | Admitting: Hematology & Oncology

## 2012-10-23 ENCOUNTER — Other Ambulatory Visit: Payer: Self-pay | Admitting: Hematology & Oncology

## 2012-10-23 DIAGNOSIS — B370 Candidal stomatitis: Secondary | ICD-10-CM

## 2012-11-03 ENCOUNTER — Other Ambulatory Visit: Payer: Self-pay | Admitting: Hematology & Oncology

## 2012-11-06 ENCOUNTER — Encounter (HOSPITAL_COMMUNITY): Payer: Self-pay | Admitting: Pharmacy Technician

## 2012-11-06 ENCOUNTER — Other Ambulatory Visit: Payer: Self-pay | Admitting: Hematology & Oncology

## 2012-11-06 ENCOUNTER — Other Ambulatory Visit: Payer: Self-pay | Admitting: *Deleted

## 2012-11-06 DIAGNOSIS — C9000 Multiple myeloma not having achieved remission: Secondary | ICD-10-CM

## 2012-11-07 ENCOUNTER — Ambulatory Visit (HOSPITAL_COMMUNITY)
Admission: RE | Admit: 2012-11-07 | Discharge: 2012-11-07 | Disposition: A | Discharge status: Home / Self Care | Payer: Medicare Other | Source: Ambulatory Visit | Attending: Hematology & Oncology | Admitting: Hematology & Oncology

## 2012-11-07 ENCOUNTER — Ambulatory Visit (HOSPITAL_BASED_OUTPATIENT_CLINIC_OR_DEPARTMENT_OTHER): Payer: Medicare Other | Admitting: Hematology & Oncology

## 2012-11-07 VITALS — BP 95/52 | HR 88 | Resp 18

## 2012-11-07 DIAGNOSIS — D649 Anemia, unspecified: Secondary | ICD-10-CM

## 2012-11-07 DIAGNOSIS — C9000 Multiple myeloma not having achieved remission: Secondary | ICD-10-CM

## 2012-11-07 LAB — CBC
MCH: 34.1 pg — ABNORMAL HIGH (ref 26.0–34.0)
MCHC: 33 g/dL (ref 30.0–36.0)
Platelets: 120 10*3/uL — ABNORMAL LOW (ref 150–400)
RDW: 20.3 % — ABNORMAL HIGH (ref 11.5–15.5)

## 2012-11-07 LAB — DIFFERENTIAL
Basophils Absolute: 0 10*3/uL (ref 0.0–0.1)
Eosinophils Absolute: 0.2 10*3/uL (ref 0.0–0.7)
Lymphocytes Relative: 12 % (ref 12–46)
Monocytes Absolute: 0.5 10*3/uL (ref 0.1–1.0)
Neutrophils Relative %: 69 % (ref 43–77)

## 2012-11-07 MED ORDER — MEPERIDINE HCL 25 MG/ML IJ SOLN
INTRAMUSCULAR | Status: AC | PRN
  Administered 2012-11-07 (×2): 12.5 mg via INTRAVENOUS

## 2012-11-07 MED ORDER — SODIUM CHLORIDE 0.9 % IJ SOLN: 3.0000 mL | INTRAMUSCULAR | Status: DC | PRN

## 2012-11-07 MED ORDER — HEPARIN SOD (PORK) LOCK FLUSH 100 UNIT/ML IV SOLN
250.0000 [IU] | INTRAVENOUS | Status: DC | PRN
  Filled 2012-11-07: qty 10

## 2012-11-07 MED ORDER — MEPERIDINE HCL 50 MG/ML IJ SOLN
INTRAMUSCULAR | Status: AC
  Filled 2012-11-07: qty 1

## 2012-11-07 MED ORDER — MIDAZOLAM HCL 10 MG/2ML IJ SOLN: 2.5000 mg | Freq: Once | INTRAMUSCULAR | Status: DC

## 2012-11-07 MED ORDER — MIDAZOLAM HCL 2 MG/2ML IJ SOLN
INTRAMUSCULAR | Status: AC | PRN
  Administered 2012-11-07 (×7): 1 mg via INTRAVENOUS

## 2012-11-07 MED ORDER — HEPARIN SOD (PORK) LOCK FLUSH 100 UNIT/ML IV SOLN: 500.0000 [IU] | INTRAVENOUS | Status: DC

## 2012-11-07 MED ORDER — SODIUM CHLORIDE 0.9 % IV SOLN
Freq: Once | INTRAVENOUS | Status: AC
  Administered 2012-11-07: 08:00:00 via INTRAVENOUS

## 2012-11-07 MED ORDER — HEPARIN SOD (PORK) LOCK FLUSH 100 UNIT/ML IV SOLN: 500.0000 [IU] | INTRAVENOUS | Status: DC | PRN

## 2012-11-07 MED ORDER — HEPARIN SOD (PORK) LOCK FLUSH 100 UNIT/ML IV SOLN
500.0000 [IU] | Freq: Every day | INTRAVENOUS | Status: AC | PRN
  Administered 2012-11-07: 500 [IU]

## 2012-11-07 MED ORDER — HEPARIN SOD (PORK) LOCK FLUSH 100 UNIT/ML IV SOLN: 250.0000 [IU] | INTRAVENOUS | Status: DC | PRN

## 2012-11-07 MED ORDER — HEPARIN SOD (PORK) LOCK FLUSH 100 UNIT/ML IV SOLN
500.0000 [IU] | INTRAVENOUS | Status: AC | PRN
  Administered 2012-11-07: 500 [IU]

## 2012-11-07 MED ORDER — MEPERIDINE HCL 50 MG/ML IJ SOLN: 50.0000 mg | Freq: Once | INTRAMUSCULAR | Status: DC

## 2012-11-07 MED ORDER — MIDAZOLAM HCL 10 MG/2ML IJ SOLN
INTRAMUSCULAR | Status: AC
  Filled 2012-11-07: qty 2

## 2012-11-07 NOTE — Procedures (Signed)
[name] was brought to the short stay unit at [facility].  We tried to access her Port-A-Cath but did not get a return. As such, she had a peripheral line placed.  Her Mallampati score was 2.  Her ASA class was 2.  We did the appropriate time-out procedure.  We then placed her onto her right side.  She received a total of 7 mg of Versed and 25 mg of Demerol for IV sedation.  The left posterior iliac crest region was prepped and draped in sterile fashion.  Eight cc of 2% lidocaine was infiltrated under the skin down to the periosteum.  A #11 scalpel was used to make an incision into the skin.  We did obtain 2 bone marrow aspirates.  These were slightly dilute but did contain spicules.  One aspirate was sent off for flow cytometry, cytogenetics and FISH panel.  A #11 scalpel was then used to make a 2nd incision to the skin.  We did get a bone marrow biopsy core.  This was adequate in length.  We dressed the surgical site sterilely.  [name] tolerated the procedure well.  There were no complications.    ______________________________ Josph Macho, M.D. PRE/MEDQ  D:  11/07/2012  T:  11/07/2012  Job:  [id number]

## 2012-11-07 NOTE — ED Notes (Signed)
Family updated as to patient's status.

## 2012-11-07 NOTE — ED Notes (Signed)
Procedure ends and dressing placed on posterior iliac area with hypafix and gauze. Pt placed supine with towel to site for pressure

## 2012-11-07 NOTE — Progress Notes (Signed)
Pt is insistent on having dual lumen PAC used for her Bone Marrow biopsy today even though "it does not always drawn blood" I explained to patient that if I did not get a blood return that I would be unable to use it for the sedation but she was insistent on it being accessed. Per protocol I accessed medial potr with 20 ga x 1inch huber needle and flushed easily but was unable to aspirate and after respositing patient I again flushed it and still no blood return. Huber needle was left in medial port. I again per protocol accessed lateral port with 20 ga x 1 inch huber needle and flushed easily but was unable to aspirate blood.After reposition patient and flushing port I was still unable to aspirate blood. I also left the lateral port accessed with Huber needle and capped off both ports. I then was able to get a peripheral IV site and drawn CBC pre procedure. Upon arrival of Dr Myna Hidalgo procedure was completed. I was instructed to flush ports with Heparin and deaccess ports and his office would be in contact with patient about future evaluation of ports.

## 2012-11-07 NOTE — ED Notes (Signed)
Patient denies pain and is resting comfortably. C/o of mild left leg pin during procedure but denies any c/o at this time

## 2012-11-07 NOTE — ED Notes (Signed)
derssing remains CDI

## 2012-11-07 NOTE — Progress Notes (Signed)
This office note has been dictated.

## 2012-11-07 NOTE — ED Notes (Signed)
Dressing remains C/D/I.

## 2012-11-07 NOTE — Sedation Documentation (Signed)
Medication dose calculated and verified WUJ:[id number] 7mg  IV DEMEROL 25 mg IV

## 2012-11-07 NOTE — ED Notes (Signed)
Patient is resting comfortably. 

## 2012-11-11 ENCOUNTER — Other Ambulatory Visit: Payer: Self-pay | Admitting: Hematology & Oncology

## 2012-11-14 ENCOUNTER — Other Ambulatory Visit (HOSPITAL_BASED_OUTPATIENT_CLINIC_OR_DEPARTMENT_OTHER): Payer: Medicare Other | Admitting: Lab

## 2012-11-14 ENCOUNTER — Ambulatory Visit (HOSPITAL_BASED_OUTPATIENT_CLINIC_OR_DEPARTMENT_OTHER): Payer: Medicare Other | Admitting: Hematology & Oncology

## 2012-11-14 ENCOUNTER — Ambulatory Visit (HOSPITAL_BASED_OUTPATIENT_CLINIC_OR_DEPARTMENT_OTHER): Payer: Medicare Other

## 2012-11-14 VITALS — BP 116/55 | HR 82 | Temp 98.6°F | Resp 16 | Ht 59.0 in | Wt 123.0 lb

## 2012-11-14 DIAGNOSIS — N189 Chronic kidney disease, unspecified: Secondary | ICD-10-CM

## 2012-11-14 DIAGNOSIS — D649 Anemia, unspecified: Secondary | ICD-10-CM

## 2012-11-14 DIAGNOSIS — C9000 Multiple myeloma not having achieved remission: Secondary | ICD-10-CM

## 2012-11-14 DIAGNOSIS — D801 Nonfamilial hypogammaglobulinemia: Secondary | ICD-10-CM

## 2012-11-14 DIAGNOSIS — N289 Disorder of kidney and ureter, unspecified: Secondary | ICD-10-CM

## 2012-11-14 LAB — CBC WITH DIFFERENTIAL (CANCER CENTER ONLY)
MCHC: 32 g/dL (ref 32.0–36.0)
MCV: 107 fL — ABNORMAL HIGH (ref 81–101)
Platelets: 106 10*3/uL — ABNORMAL LOW (ref 145–400)
RDW: 20 % — ABNORMAL HIGH (ref 11.1–15.7)
WBC: 4.4 10*3/uL (ref 3.9–10.0)

## 2012-11-14 LAB — CMP (CANCER CENTER ONLY)
ALT(SGPT): 30 U/L (ref 10–47)
Albumin: 3.6 g/dL (ref 3.3–5.5)
CO2: 27 mEq/L (ref 18–33)
Calcium: 9.7 mg/dL (ref 8.0–10.3)
Chloride: 101 mEq/L (ref 98–108)
Glucose, Bld: 103 mg/dL (ref 73–118)
Sodium: 142 mEq/L (ref 128–145)
Total Bilirubin: 0.6 mg/dl (ref 0.20–1.60)
Total Protein: 7.5 g/dL (ref 6.4–8.1)

## 2012-11-14 LAB — MANUAL DIFFERENTIAL (CHCC SATELLITE)
ALC: 0.5 10*3/uL — ABNORMAL LOW (ref 0.6–2.2)
ANC (CHCC HP manual diff): 3 10*3/uL (ref 1.5–6.7)
BASO: 1 % (ref 0–2)
LYMPH: 12 % — ABNORMAL LOW (ref 14–48)
PLT EST ~~LOC~~: DECREASED

## 2012-11-14 LAB — HOLD TUBE, BLOOD BANK - CHCC SATELLITE

## 2012-11-14 MED ORDER — DARBEPOETIN ALFA-POLYSORBATE 300 MCG/0.6ML IJ SOLN
300.0000 ug | Freq: Once | INTRAMUSCULAR | Status: AC
  Administered 2012-11-14: 300 ug via SUBCUTANEOUS

## 2012-11-14 MED ORDER — ZOLEDRONIC ACID 4 MG/5ML IV CONC
3.3000 mg | Freq: Once | INTRAVENOUS | Status: AC
  Administered 2012-11-14: 3.3 mg via INTRAVENOUS
  Filled 2012-11-14: qty 4.13

## 2012-11-14 NOTE — Progress Notes (Signed)
This office note has been dictated.

## 2012-11-14 NOTE — Patient Instructions (Addendum)
Darbepoetin Alfa injection What is this medicine? DARBEPOETIN ALFA (dar be POE e tin AL fa) helps your body make more red blood cells. It is used to treat anemia caused by chronic kidney failure and chemotherapy. This medicine may be used for other purposes; ask your health care provider or pharmacist if you have questions. What should I tell my health care provider before I take this medicine? They need to know if you have any of these conditions: -blood clotting disorders or history of blood clots -cancer patient not on chemotherapy -cystic fibrosis -heart disease, such as angina, heart failure, or a history of a heart attack -hemoglobin level of 12 g/dL or greater -high blood pressure -low levels of folate, iron, or vitamin B12 -seizures -an unusual or allergic reaction to darbepoetin, erythropoietin, albumin, hamster proteins, latex, other medicines, foods, dyes, or preservatives -pregnant or trying to get pregnant -breast-feeding How should I use this medicine? This medicine is for injection into a vein or under the skin. It is usually given by a health care professional in a hospital or clinic setting. If you get this medicine at home, you will be taught how to prepare and give this medicine. Do not shake the solution before you withdraw a dose. Use exactly as directed. Take your medicine at regular intervals. Do not take your medicine more often than directed. It is important that you put your used needles and syringes in a special sharps container. Do not put them in a trash can. If you do not have a sharps container, call your pharmacist or healthcare provider to get one. Talk to your pediatrician regarding the use of this medicine in children. While this medicine may be used in children as young as 1 year for selected conditions, precautions do apply. Overdosage: If you think you have taken too much of this medicine contact a poison control center or emergency room at once. NOTE:  This medicine is only for you. Do not share this medicine with others. What if I miss a dose? If you miss a dose, take it as soon as you can. If it is almost time for your next dose, take only that dose. Do not take double or extra doses. What may interact with this medicine? Do not take this medicine with any of the following medications: -epoetin alfa This list may not describe all possible interactions. Give your health care provider a list of all the medicines, herbs, non-prescription drugs, or dietary supplements you use. Also tell them if you smoke, drink alcohol, or use illegal drugs. Some items may interact with your medicine. What should I watch for while using this medicine? Visit your prescriber or health care professional for regular checks on your progress and for the needed blood tests and blood pressure measurements. It is especially important for the doctor to make sure your hemoglobin level is in the desired range, to limit the risk of potential side effects and to give you the best benefit. Keep all appointments for any recommended tests. Check your blood pressure as directed. Ask your doctor what your blood pressure should be and when you should contact him or her. As your body makes more red blood cells, you may need to take iron, folic acid, or vitamin B supplements. Ask your doctor or health care provider which products are right for you. If you have kidney disease continue dietary restrictions, even though this medication can make you feel better. Talk with your doctor or health care professional about the   foods you eat and the vitamins that you take. What side effects may I notice from receiving this medicine? Side effects that you should report to your doctor or health care professional as soon as possible: -allergic reactions like skin rash, itching or hives, swelling of the face, lips, or tongue -breathing problems -changes in vision -chest pain -confusion, trouble speaking  or understanding -feeling faint or lightheaded, falls -high blood pressure -muscle aches or pains -pain, swelling, warmth in the leg -rapid weight gain -severe headaches -sudden numbness or weakness of the face, arm or leg -trouble walking, dizziness, loss of balance or coordination -seizures (convulsions) -swelling of the ankles, feet, hands -unusually weak or tired Side effects that usually do not require medical attention (report to your doctor or health care professional if they continue or are bothersome): -diarrhea -fever, chills (flu-like symptoms) -headaches -nausea, vomiting -redness, stinging, or swelling at site where injected This list may not describe all possible side effects. Call your doctor for medical advice about side effects. You may report side effects to FDA at 1-800-FDA-1088. Where should I keep my medicine? Keep out of the reach of children. Store in a refrigerator between 2 and 8 degrees C (36 and 46 degrees F). Do not freeze. Do not shake. Throw away any unused portion if using a single-dose vial. Throw away any unused medicine after the expiration date. NOTE: This sheet is a summary. It may not cover all possible information. If you have questions about this medicine, talk to your doctor, pharmacist, or health care provider.  2012, [employer]vier/Gold Standard. (09/17/2008 10:23:57 AM)Zoledronic Acid injection (Hypercalcemia, Oncology) What is this medicine? ZOLEDRONIC ACID (ZOE le dron ik AS id) lowers the amount of calcium loss from bone. It is used to treat too much calcium in your blood from cancer. It is also used to prevent complications of cancer that has spread to the bone. This medicine may be used for other purposes; ask your health care provider or pharmacist if you have questions. What should I tell my health care provider before I take this medicine? They need to know if you have any of these conditions: -aspirin-sensitive asthma -dental  disease -kidney disease -an unusual or allergic reaction to zoledronic acid, other medicines, foods, dyes, or preservatives -pregnant or trying to get pregnant -breast-feeding How should I use this medicine? This medicine is for infusion into a vein. It is given by a health care professional in a hospital or clinic setting. Talk to your pediatrician regarding the use of this medicine in children. Special care may be needed. Overdosage: If you think you have taken too much of this medicine contact a poison control center or emergency room at once. NOTE: This medicine is only for you. Do not share this medicine with others. What if I miss a dose? It is important not to miss your dose. Call your doctor or health care professional if you are unable to keep an appointment. What may interact with this medicine? -certain antibiotics given by injection -NSAIDs, medicines for pain and inflammation, like ibuprofen or naproxen -some diuretics like bumetanide, furosemide -teriparatide -thalidomide This list may not describe all possible interactions. Give your health care provider a list of all the medicines, herbs, non-prescription drugs, or dietary supplements you use. Also tell them if you smoke, drink alcohol, or use illegal drugs. Some items may interact with your medicine. What should I watch for while using this medicine? Visit your doctor or health care professional for regular checkups. It may   be some time before you see the benefit from this medicine. Do not stop taking your medicine unless your doctor tells you to. Your doctor may order blood tests or other tests to see how you are doing. Women should inform their doctor if they wish to become pregnant or think they might be pregnant. There is a potential for serious side effects to an unborn child. Talk to your health care professional or pharmacist for more information. You should make sure that you get enough calcium and vitamin D while you  are taking this medicine. Discuss the foods you eat and the vitamins you take with your health care professional. Some people who take this medicine have severe bone, joint, and/or muscle pain. This medicine may also increase your risk for a broken thigh bone. Tell your doctor right away if you have pain in your upper leg or groin. Tell your doctor if you have any pain that does not go away or that gets worse. What side effects may I notice from receiving this medicine? Side effects that you should report to your doctor or health care professional as soon as possible: -allergic reactions like skin rash, itching or hives, swelling of the face, lips, or tongue -anxiety, confusion, or depression -breathing problems -changes in vision -feeling faint or lightheaded, falls -jaw burning, cramping, pain -muscle cramps, stiffness, or weakness -trouble passing urine or change in the amount of urine Side effects that usually do not require medical attention (report to your doctor or health care professional if they continue or are bothersome): -bone, joint, or muscle pain -fever -hair loss -irritation at site where injected -loss of appetite -nausea, vomiting -stomach upset -tired This list may not describe all possible side effects. Call your doctor for medical advice about side effects. You may report side effects to FDA at 1-800-FDA-1088. Where should I keep my medicine? This drug is given in a hospital or clinic and will not be stored at home. NOTE: This sheet is a summary. It may not cover all possible information. If you have questions about this medicine, talk to your doctor, pharmacist, or health care provider.  2012, Elsevier/Gold Standard. (04/02/2011 9:06:58 AM) 

## 2012-11-15 ENCOUNTER — Telehealth: Payer: Self-pay | Admitting: Hematology & Oncology

## 2012-11-15 LAB — IGG, IGA, IGM
IgA: <7 mg/dL — ABNORMAL LOW (ref 69–380)
IgM, Serum: <5 mg/dL — ABNORMAL LOW (ref 52–322)

## 2012-11-15 LAB — KAPPA/LAMBDA LIGHT CHAINS: Kappa:Lambda Ratio: 32.31 — ABNORMAL HIGH (ref 0.26–1.65)

## 2012-11-15 NOTE — Telephone Encounter (Signed)
Left pt message with February appointments

## 2012-11-15 NOTE — Progress Notes (Signed)
CC:   Stacie Arbour, MD  DIAGNOSES: 1. 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 3. Chronic renal insufficiency with anemia.  CURRENT THERAPY: 1. The patient is status post 3 cycles of     bendamustine/Velcade/Decadron. 2. Zometa 3.3 mg IV q.4 weeks. 3. IVIG q.4 weeks. 4. Aranesp 300 mcg subcu as needed for hemoglobin less than 10.  INTERIM HISTORY:  [name] comes in for followup.  We did go ahead and repeat a bone marrow biopsy on her.  This was done on 11/07/2012. The bone marrow report ([id number]) showed plasma cell involvement of 26%. This was consistent with her last bone marrow biopsy that was done back in August.  We did send off cytogenetics, which we do not have back yet.  I think that this would be considered as stable disease.  As such, I think that it would be reasonable to continue her on the bendamustine/Velcade protocol.  She really does look good.  I am glad she that was able to take a "break" from chemotherapy.  She has had no problems with nausea or vomiting.  She does have pain issues from past radiation reactions.  She has had no fever.  There has been no shortness of breath.  She has had no leg swelling.  She has not noticed any rashes.  There has been no change in bowel or bladder habits.  She does have asthma.  This has not been a problem for her.  PHYSICAL EXAMINATION:  General:  This is a fairly well-developed, well- nourished white female, in no obvious distress.  Vital signs: Temperature of 98.6, pulse 82, respiratory rate 16, blood pressure 116/55.  Weight is 123.  Head and neck:  Normocephalic, atraumatic skull.  There are no ocular or oral lesions.  There are no palpable cervical or supraclavicular lymph nodes.  Lungs:  Clear to percussion and auscultation bilaterally.  Cardiac:  Regular rate and rhythm, with a normal S1 and S2.  There are no murmurs, rubs, or bruits.  Abdomen: Soft, with good bowel sounds.  There is  no palpable abdominal mass. There is no fluid wave.  There is no palpable hepatosplenomegaly.  Back: Shows no tenderness over the spine, ribs, or hips.  Extremities:  A lot of firmness on the right thigh from past radiation.  She has no swelling in the lower legs.  Skin:  No rashes.  LABORATORY STUDIES:  White cell count is 4.4, hemoglobin 9.1, hematocrit 28.4, platelet count 106,000.  A serum free kappa light chain was 4.2 mg/dL.  BUN 22, creatinine 1.8.  IMPRESSION:  [name] is a 67 year old white female with refractory kappa light chain myeloma.  She actually has done fairly well.  She has really been through as much chemo as I can think of.  She has not yet had Doxil.  I do think that we should continue her on the bendamustine/Velcade/Decadron protocol.  Again, her disease appears to be relatively stable, at least by bone marrow biopsy.  We will go ahead and give her a dose of Aranesp today.  She will get her IVIG, I think, with her chemotherapy.  Again, [name] has an interesting quality of life.  So far, she has done well with this.  We will start next week.    ______________________________ Josph Macho, M.D. PRE/MEDQ  D:  11/15/2012  T:  11/15/2012  Job:  [id number]

## 2012-11-15 NOTE — Telephone Encounter (Signed)
Pt moved 2-5 and 2-6 to 2-4 and 2-5

## 2012-11-16 LAB — PROTEIN ELECTROPHORESIS, SERUM, WITH REFLEX
Alpha-1-Globulin: 5.2 % — ABNORMAL HIGH (ref 2.9–4.9)
Alpha-2-Globulin: 12.6 % — ABNORMAL HIGH (ref 7.1–11.8)
Beta Globulin: 5.6 % (ref 4.7–7.2)
Total Protein, Serum Electrophoresis: 6.9 g/dL (ref 6.0–8.3)

## 2012-11-21 ENCOUNTER — Ambulatory Visit (HOSPITAL_BASED_OUTPATIENT_CLINIC_OR_DEPARTMENT_OTHER): Payer: Medicare Other

## 2012-11-21 ENCOUNTER — Other Ambulatory Visit (HOSPITAL_BASED_OUTPATIENT_CLINIC_OR_DEPARTMENT_OTHER): Payer: Medicare Other | Admitting: Lab

## 2012-11-21 VITALS — BP 120/78 | HR 78 | Temp 97.9°F | Resp 18

## 2012-11-21 DIAGNOSIS — N289 Disorder of kidney and ureter, unspecified: Secondary | ICD-10-CM

## 2012-11-21 DIAGNOSIS — Z5112 Encounter for antineoplastic immunotherapy: Secondary | ICD-10-CM

## 2012-11-21 DIAGNOSIS — C9000 Multiple myeloma not having achieved remission: Secondary | ICD-10-CM

## 2012-11-21 LAB — CMP (CANCER CENTER ONLY)
ALT(SGPT): 28 U/L (ref 10–47)
CO2: 26 mEq/L (ref 18–33)
Calcium: 9.4 mg/dL (ref 8.0–10.3)
Chloride: 105 mEq/L (ref 98–108)
Creat: 1.8 mg/dl — ABNORMAL HIGH (ref 0.6–1.2)
Glucose, Bld: 103 mg/dL (ref 73–118)
Total Bilirubin: 0.6 mg/dl (ref 0.20–1.60)

## 2012-11-21 LAB — MANUAL DIFFERENTIAL (CHCC SATELLITE)
ALC: 0.6 10*3/uL (ref 0.6–2.2)
ANC (CHCC HP manual diff): 3.6 10*3/uL (ref 1.5–6.7)
BASO: 1 % (ref 0–2)
Band Neutrophils: 2 % (ref 0–10)
Blasts: 0 % (ref 0–0)
Metamyelocytes: 1 % — ABNORMAL HIGH (ref 0–0)
Other Comments: 0
PROMYELO: 0 % (ref 0–0)
Variant Lymph: 0 % (ref 0–0)

## 2012-11-21 LAB — CBC WITH DIFFERENTIAL (CANCER CENTER ONLY)
HGB: 8.8 g/dL — ABNORMAL LOW (ref 11.6–15.9)
MCH: 35.2 pg — ABNORMAL HIGH (ref 26.0–34.0)
MCHC: 31.9 g/dL — ABNORMAL LOW (ref 32.0–36.0)
Platelets: 102 10*3/uL — ABNORMAL LOW (ref 145–400)
RDW: 20.7 % — ABNORMAL HIGH (ref 11.1–15.7)

## 2012-11-21 LAB — IRON AND TIBC
Iron: 91 ug/dL (ref 42–145)
UIBC: 132 ug/dL (ref 125–400)

## 2012-11-21 LAB — FERRITIN: Ferritin: 4870 ng/mL — ABNORMAL HIGH (ref 10–291)

## 2012-11-21 MED ORDER — HEPARIN SOD (PORK) LOCK FLUSH 100 UNIT/ML IV SOLN
500.0000 [IU] | Freq: Once | INTRAVENOUS | Status: AC | PRN
  Administered 2012-11-21: 500 [IU]
  Filled 2012-11-21: qty 5

## 2012-11-21 MED ORDER — SODIUM CHLORIDE 0.9 % IJ SOLN
3.0000 mL | INTRAMUSCULAR | Status: DC | PRN
  Filled 2012-11-21: qty 10

## 2012-11-21 MED ORDER — ALTEPLASE 2 MG IJ SOLR
2.0000 mg | Freq: Once | INTRAMUSCULAR | Status: DC | PRN
  Filled 2012-11-21: qty 2

## 2012-11-21 MED ORDER — ONDANSETRON 8 MG/50ML IVPB (CHCC)
8.0000 mg | Freq: Once | INTRAVENOUS | Status: AC
  Administered 2012-11-21: 8 mg via INTRAVENOUS

## 2012-11-21 MED ORDER — HEPARIN SOD (PORK) LOCK FLUSH 100 UNIT/ML IV SOLN
250.0000 [IU] | Freq: Once | INTRAVENOUS | Status: DC | PRN
  Filled 2012-11-21: qty 5

## 2012-11-21 MED ORDER — SODIUM CHLORIDE 0.9 % IJ SOLN
10.0000 mL | INTRAMUSCULAR | Status: DC | PRN
  Administered 2012-11-21: 10 mL
  Filled 2012-11-21: qty 10

## 2012-11-21 MED ORDER — BORTEZOMIB CHEMO SQ INJECTION 3.5 MG (2.5MG/ML)
1.3000 mg/m2 | Freq: Once | INTRAMUSCULAR | Status: AC
  Administered 2012-11-21: 2 mg via SUBCUTANEOUS
  Filled 2012-11-21: qty 0.8

## 2012-11-21 MED ORDER — DEXAMETHASONE SODIUM PHOSPHATE 10 MG/ML IJ SOLN
10.0000 mg | Freq: Once | INTRAMUSCULAR | Status: AC
Start: 2012-11-21 — End: 2012-11-21
  Administered 2012-11-21: 10 mg via INTRAVENOUS

## 2012-11-21 MED ORDER — SODIUM CHLORIDE 0.9 % IV SOLN
Freq: Once | INTRAVENOUS | Status: AC
  Administered 2012-11-21: 13:00:00 via INTRAVENOUS

## 2012-11-21 MED ORDER — SODIUM CHLORIDE 0.9 % IV SOLN
60.0000 mg/m2 | Freq: Once | INTRAVENOUS | Status: AC
  Administered 2012-11-21: 100 mg via INTRAVENOUS
  Filled 2012-11-21: qty 20

## 2012-11-22 ENCOUNTER — Other Ambulatory Visit: Payer: Medicare Other | Admitting: Lab

## 2012-11-22 ENCOUNTER — Ambulatory Visit (HOSPITAL_BASED_OUTPATIENT_CLINIC_OR_DEPARTMENT_OTHER): Payer: Medicare Other

## 2012-11-22 DIAGNOSIS — Z5111 Encounter for antineoplastic chemotherapy: Secondary | ICD-10-CM

## 2012-11-22 DIAGNOSIS — C9000 Multiple myeloma not having achieved remission: Secondary | ICD-10-CM

## 2012-11-22 DIAGNOSIS — Z452 Encounter for adjustment and management of vascular access device: Secondary | ICD-10-CM

## 2012-11-22 MED ORDER — HEPARIN SOD (PORK) LOCK FLUSH 100 UNIT/ML IV SOLN
500.0000 [IU] | Freq: Once | INTRAVENOUS | Status: AC | PRN
  Administered 2012-11-22: 500 [IU]
  Filled 2012-11-22: qty 5

## 2012-11-22 MED ORDER — LORAZEPAM 0.5 MG PO TABS
0.5000 mg | ORAL_TABLET | Freq: Once | ORAL | Status: AC
  Administered 2012-11-22: 0.5 mg via ORAL

## 2012-11-22 MED ORDER — SODIUM CHLORIDE 0.9 % IV SOLN
60.0000 mg/m2 | Freq: Once | INTRAVENOUS | Status: AC
  Administered 2012-11-22: 100 mg via INTRAVENOUS
  Filled 2012-11-22: qty 20

## 2012-11-22 MED ORDER — DEXAMETHASONE SODIUM PHOSPHATE 10 MG/ML IJ SOLN
10.0000 mg | Freq: Once | INTRAMUSCULAR | Status: AC
  Administered 2012-11-22: 10 mg via INTRAVENOUS

## 2012-11-22 MED ORDER — SODIUM CHLORIDE 0.9 % IV SOLN
Freq: Once | INTRAVENOUS | Status: AC
  Administered 2012-11-22: 13:00:00 via INTRAVENOUS

## 2012-11-22 MED ORDER — ALTEPLASE 2 MG IJ SOLR
2.0000 mg | Freq: Once | INTRAMUSCULAR | Status: AC
  Administered 2012-11-22: 2 mg
  Filled 2012-11-22: qty 2

## 2012-11-22 MED ORDER — ONDANSETRON 8 MG/50ML IVPB (CHCC)
8.0000 mg | Freq: Once | INTRAVENOUS | Status: AC
  Administered 2012-11-22: 8 mg via INTRAVENOUS

## 2012-11-22 MED ORDER — HEPARIN SOD (PORK) LOCK FLUSH 100 UNIT/ML IV SOLN
250.0000 [IU] | Freq: Once | INTRAVENOUS | Status: DC | PRN
  Filled 2012-11-22: qty 5

## 2012-11-22 NOTE — Patient Instructions (Addendum)
[facility] Discharge Instructions for Patients Receiving Chemotherapy  Today you received the following chemotherapy agents Treanda  To help prevent nausea and vomiting after your treatment, we encourage you to take your nausea medication Compazine as needed.      If you develop nausea and vomiting that is not controlled by your nausea medication, call the clinic. If it is after clinic hours your family physician or the after hours number for the clinic or go to the Emergency Department.   BELOW ARE SYMPTOMS THAT SHOULD BE REPORTED IMMEDIATELY:  *FEVER GREATER THAN 100.5 F  *CHILLS WITH OR WITHOUT FEVER  NAUSEA AND VOMITING THAT IS NOT CONTROLLED WITH YOUR NAUSEA MEDICATION  *UNUSUAL SHORTNESS OF BREATH  *UNUSUAL BRUISING OR BLEEDING  TENDERNESS IN MOUTH AND THROAT WITH OR WITHOUT PRESENCE OF ULCERS  *URINARY PROBLEMS  *BOWEL PROBLEMS  UNUSUAL RASH Items with * indicate a potential emergency and should be followed up as soon as possible.  One of the nurses will contact you 24 hours after your treatment. Please let the nurse know about any problems that you may have experienced. Feel free to call the clinic you have any questions or concerns. The clinic phone number is [phone].   I have been informed and understand all the instructions given to me. I know to contact the clinic, my physician, or go to the Emergency Department if any problems should occur. I do not have any questions at this time, but understand that I may call the clinic during office hours   should I have any questions or need assistance in obtaining follow up care.    __________________________________________  _____________  __________ Signature of Patient or Authorized Representative            Date                   Time    __________________________________________ Nurse's Signature

## 2012-11-23 ENCOUNTER — Ambulatory Visit: Payer: Medicare Other

## 2012-11-28 ENCOUNTER — Inpatient Hospital Stay: Payer: Medicare Other

## 2012-11-28 ENCOUNTER — Encounter (HOSPITAL_COMMUNITY): Payer: Self-pay

## 2012-11-28 ENCOUNTER — Telehealth: Payer: Self-pay | Admitting: Hematology & Oncology

## 2012-11-28 ENCOUNTER — Other Ambulatory Visit: Payer: Medicare Other | Admitting: Lab

## 2012-11-28 ENCOUNTER — Inpatient Hospital Stay (HOSPITAL_COMMUNITY)
Admission: EM | Admit: 2012-11-28 | Discharge: 2012-12-04 | DRG: 392 | Disposition: A | Discharge status: Home / Self Care | Payer: Medicare Other | Attending: Internal Medicine | Admitting: Internal Medicine

## 2012-11-28 DIAGNOSIS — F329 Major depressive disorder, single episode, unspecified: Secondary | ICD-10-CM | POA: Diagnosis present

## 2012-11-28 DIAGNOSIS — A088 Other specified intestinal infections: Principal | ICD-10-CM | POA: Diagnosis present

## 2012-11-28 DIAGNOSIS — G8929 Other chronic pain: Secondary | ICD-10-CM | POA: Diagnosis present

## 2012-11-28 DIAGNOSIS — R197 Diarrhea, unspecified: Secondary | ICD-10-CM

## 2012-11-28 DIAGNOSIS — B3731 Acute candidiasis of vulva and vagina: Secondary | ICD-10-CM | POA: Diagnosis present

## 2012-11-28 DIAGNOSIS — C9000 Multiple myeloma not having achieved remission: Secondary | ICD-10-CM

## 2012-11-28 DIAGNOSIS — R112 Nausea with vomiting, unspecified: Secondary | ICD-10-CM | POA: Diagnosis present

## 2012-11-28 DIAGNOSIS — K529 Noninfective gastroenteritis and colitis, unspecified: Secondary | ICD-10-CM

## 2012-11-28 DIAGNOSIS — A498 Other bacterial infections of unspecified site: Secondary | ICD-10-CM | POA: Diagnosis present

## 2012-11-28 DIAGNOSIS — K5289 Other specified noninfective gastroenteritis and colitis: Secondary | ICD-10-CM

## 2012-11-28 DIAGNOSIS — D649 Anemia, unspecified: Secondary | ICD-10-CM

## 2012-11-28 DIAGNOSIS — J45909 Unspecified asthma, uncomplicated: Secondary | ICD-10-CM

## 2012-11-28 DIAGNOSIS — E039 Hypothyroidism, unspecified: Secondary | ICD-10-CM

## 2012-11-28 DIAGNOSIS — F3289 Other specified depressive episodes: Secondary | ICD-10-CM | POA: Diagnosis present

## 2012-11-28 DIAGNOSIS — F411 Generalized anxiety disorder: Secondary | ICD-10-CM | POA: Diagnosis present

## 2012-11-28 DIAGNOSIS — J449 Chronic obstructive pulmonary disease, unspecified: Secondary | ICD-10-CM | POA: Diagnosis present

## 2012-11-28 DIAGNOSIS — N318 Other neuromuscular dysfunction of bladder: Secondary | ICD-10-CM | POA: Diagnosis present

## 2012-11-28 DIAGNOSIS — M7021 Olecranon bursitis, right elbow: Secondary | ICD-10-CM

## 2012-11-28 DIAGNOSIS — D638 Anemia in other chronic diseases classified elsewhere: Secondary | ICD-10-CM | POA: Diagnosis present

## 2012-11-28 DIAGNOSIS — Z79899 Other long term (current) drug therapy: Secondary | ICD-10-CM

## 2012-11-28 DIAGNOSIS — B373 Candidiasis of vulva and vagina: Secondary | ICD-10-CM | POA: Diagnosis present

## 2012-11-28 DIAGNOSIS — D631 Anemia in chronic kidney disease: Secondary | ICD-10-CM

## 2012-11-28 DIAGNOSIS — R748 Abnormal levels of other serum enzymes: Secondary | ICD-10-CM | POA: Diagnosis present

## 2012-11-28 DIAGNOSIS — N184 Chronic kidney disease, stage 4 (severe): Secondary | ICD-10-CM | POA: Diagnosis present

## 2012-11-28 DIAGNOSIS — K219 Gastro-esophageal reflux disease without esophagitis: Secondary | ICD-10-CM

## 2012-11-28 DIAGNOSIS — N390 Urinary tract infection, site not specified: Secondary | ICD-10-CM | POA: Diagnosis present

## 2012-11-28 DIAGNOSIS — M810 Age-related osteoporosis without current pathological fracture: Secondary | ICD-10-CM | POA: Diagnosis present

## 2012-11-28 DIAGNOSIS — E559 Vitamin D deficiency, unspecified: Secondary | ICD-10-CM

## 2012-11-28 DIAGNOSIS — N289 Disorder of kidney and ureter, unspecified: Secondary | ICD-10-CM

## 2012-11-28 DIAGNOSIS — J4489 Other specified chronic obstructive pulmonary disease: Secondary | ICD-10-CM | POA: Diagnosis present

## 2012-11-28 LAB — CBC WITH DIFFERENTIAL/PLATELET
Basophils Relative: 0 % (ref 0–1)
Eosinophils Relative: 1 % (ref 0–5)
Hemoglobin: 8.1 g/dL — ABNORMAL LOW (ref 12.0–15.0)
Lymphocytes Relative: 3 % — ABNORMAL LOW (ref 12–46)
Neutrophils Relative %: 90 % — ABNORMAL HIGH (ref 43–77)
RBC: 2.29 MIL/uL — ABNORMAL LOW (ref 3.87–5.11)
WBC Morphology: INCREASED
WBC: 3.5 10*3/uL — ABNORMAL LOW (ref 4.0–10.5)

## 2012-11-28 LAB — URINE MICROSCOPIC-ADD ON

## 2012-11-28 LAB — BASIC METABOLIC PANEL
CO2: 22 mEq/L (ref 19–32)
Calcium: 8.4 mg/dL (ref 8.4–10.5)
Chloride: 105 mEq/L (ref 96–112)
GFR calc Af Amer: 35 mL/min — ABNORMAL LOW (ref >90)
Sodium: 139 mEq/L (ref 135–145)

## 2012-11-28 LAB — URINALYSIS, ROUTINE W REFLEX MICROSCOPIC
Glucose, UA: NEGATIVE mg/dL
Nitrite: NEGATIVE
Protein, ur: 30 mg/dL — AB
pH: 5 (ref 5.0–8.0)

## 2012-11-28 LAB — RETICULOCYTES
RBC.: 2.03 MIL/uL — ABNORMAL LOW (ref 3.87–5.11)
Retic Count, Absolute: 73.1 10*3/uL (ref 19.0–186.0)

## 2012-11-28 LAB — PHOSPHORUS: Phosphorus: 2.8 mg/dL (ref 2.3–4.6)

## 2012-11-28 MED ORDER — ALPRAZOLAM 0.25 MG PO TABS
0.2500 mg | ORAL_TABLET | Freq: Three times a day (TID) | ORAL | Status: DC | PRN
  Administered 2012-11-28 – 2012-11-30 (×2): 0.25 mg via ORAL
  Filled 2012-11-28 (×2): qty 1

## 2012-11-28 MED ORDER — ACYCLOVIR 400 MG PO TABS
400.0000 mg | ORAL_TABLET | Freq: Two times a day (BID) | ORAL | Status: DC
  Administered 2012-11-28 – 2012-12-04 (×12): 400 mg via ORAL
  Filled 2012-11-28 (×13): qty 1

## 2012-11-28 MED ORDER — METRONIDAZOLE IN NACL 5-0.79 MG/ML-% IV SOLN
500.0000 mg | Freq: Three times a day (TID) | INTRAVENOUS | Status: DC
  Administered 2012-11-29 – 2012-12-03 (×14): 500 mg via INTRAVENOUS
  Filled 2012-11-28 (×14): qty 100

## 2012-11-28 MED ORDER — PSYLLIUM 95 % PO PACK
1.0000 | PACK | Freq: Every day | ORAL | Status: DC
  Administered 2012-12-02 – 2012-12-03 (×2): 1 via ORAL
  Filled 2012-11-28 (×7): qty 1

## 2012-11-28 MED ORDER — OLOPATADINE HCL 0.1 % OP SOLN
1.0000 [drp] | Freq: Two times a day (BID) | OPHTHALMIC | Status: DC | PRN
  Filled 2012-11-28: qty 5

## 2012-11-28 MED ORDER — FENTANYL CITRATE 0.05 MG/ML IJ SOLN
50.0000 ug | Freq: Once | INTRAMUSCULAR | Status: AC
  Administered 2012-11-28: 50 ug via INTRAVENOUS
  Filled 2012-11-28: qty 2

## 2012-11-28 MED ORDER — KCL IN DEXTROSE-NACL 20-5-0.45 MEQ/L-%-% IV SOLN
INTRAVENOUS | Status: DC
  Administered 2012-11-28 – 2012-12-03 (×7): via INTRAVENOUS
  Filled 2012-11-28 (×10): qty 1000

## 2012-11-28 MED ORDER — FESOTERODINE FUMARATE ER 8 MG PO TB24
8.0000 mg | ORAL_TABLET | Freq: Every day | ORAL | Status: DC
  Administered 2012-11-29 – 2012-12-04 (×6): 8 mg via ORAL
  Filled 2012-11-28 (×8): qty 1

## 2012-11-28 MED ORDER — ALBUTEROL SULFATE HFA 108 (90 BASE) MCG/ACT IN AERS
1.0000 | INHALATION_SPRAY | RESPIRATORY_TRACT | Status: DC | PRN
  Filled 2012-11-28: qty 6.7

## 2012-11-28 MED ORDER — ONDANSETRON HCL 4 MG/2ML IJ SOLN
4.0000 mg | Freq: Once | INTRAMUSCULAR | Status: AC
  Administered 2012-11-28: 4 mg via INTRAVENOUS
  Filled 2012-11-28: qty 2

## 2012-11-28 MED ORDER — BENZONATATE 100 MG PO CAPS
100.0000 mg | ORAL_CAPSULE | Freq: Every day | ORAL | Status: DC | PRN
  Filled 2012-11-28: qty 1

## 2012-11-28 MED ORDER — ONDANSETRON HCL 4 MG PO TABS
4.0000 mg | ORAL_TABLET | Freq: Four times a day (QID) | ORAL | Status: DC | PRN
  Administered 2012-12-01: 4 mg via ORAL
  Filled 2012-11-28: qty 1

## 2012-11-28 MED ORDER — SODIUM CHLORIDE 0.9 % IV BOLUS (SEPSIS)
1000.0000 mL | Freq: Once | INTRAVENOUS | Status: AC
  Administered 2012-11-28: 1000 mL via INTRAVENOUS

## 2012-11-28 MED ORDER — ONDANSETRON HCL 4 MG/2ML IJ SOLN
4.0000 mg | Freq: Four times a day (QID) | INTRAMUSCULAR | Status: DC | PRN
  Administered 2012-11-28 – 2012-12-01 (×5): 4 mg via INTRAVENOUS
  Filled 2012-11-28 (×5): qty 2

## 2012-11-28 MED ORDER — VITAMIN D (ERGOCALCIFEROL) 1.25 MG (50000 UNIT) PO CAPS
50000.0000 [IU] | ORAL_CAPSULE | ORAL | Status: DC
  Administered 2012-12-04: 50000 [IU] via ORAL
  Filled 2012-11-28: qty 1

## 2012-11-28 MED ORDER — PANTOPRAZOLE SODIUM 40 MG IV SOLR
40.0000 mg | Freq: Once | INTRAVENOUS | Status: AC
  Administered 2012-11-28: 40 mg via INTRAVENOUS
  Filled 2012-11-28: qty 40

## 2012-11-28 MED ORDER — LEVOTHYROXINE SODIUM 50 MCG PO TABS
50.0000 ug | ORAL_TABLET | Freq: Every day | ORAL | Status: DC
  Administered 2012-11-29 – 2012-12-04 (×6): 50 ug via ORAL
  Filled 2012-11-28 (×7): qty 1

## 2012-11-28 MED ORDER — SODIUM CHLORIDE 0.9 % IV SOLN
INTRAVENOUS | Status: DC
  Administered 2012-11-28: 12:00:00 via INTRAVENOUS

## 2012-11-28 MED ORDER — PROMETHAZINE HCL 25 MG/ML IJ SOLN
12.5000 mg | Freq: Once | INTRAMUSCULAR | Status: AC
  Administered 2012-11-28: 12.5 mg via INTRAVENOUS
  Filled 2012-11-28: qty 1

## 2012-11-28 MED ORDER — TEMAZEPAM 15 MG PO CAPS
30.0000 mg | ORAL_CAPSULE | Freq: Every day | ORAL | Status: DC
  Administered 2012-11-28 – 2012-12-03 (×6): 30 mg via ORAL
  Filled 2012-11-28 (×2): qty 2
  Filled 2012-11-28: qty 1
  Filled 2012-11-28: qty 2
  Filled 2012-11-28: qty 1
  Filled 2012-11-28 (×2): qty 2

## 2012-11-28 MED ORDER — PNEUMOCOCCAL VAC POLYVALENT 25 MCG/0.5ML IJ INJ
0.5000 mL | INJECTION | INTRAMUSCULAR | Status: AC
  Administered 2012-11-29: 0.5 mL via INTRAMUSCULAR
  Filled 2012-11-28 (×2): qty 0.5

## 2012-11-28 MED ORDER — NYSTATIN 100000 UNIT/ML MT SUSP
500000.0000 [IU] | Freq: Four times a day (QID) | OROMUCOSAL | Status: DC | PRN
  Filled 2012-11-28: qty 5

## 2012-11-28 MED ORDER — SODIUM CHLORIDE 0.9 % IJ SOLN
3.0000 mL | Freq: Two times a day (BID) | INTRAMUSCULAR | Status: DC
  Administered 2012-11-29 – 2012-11-30 (×3): 3 mL via INTRAVENOUS

## 2012-11-28 MED ORDER — CIPROFLOXACIN IN D5W 400 MG/200ML IV SOLN
400.0000 mg | INTRAVENOUS | Status: DC
  Administered 2012-11-29 – 2012-11-30 (×3): 400 mg via INTRAVENOUS
  Filled 2012-11-28 (×4): qty 200

## 2012-11-28 MED ORDER — HYDROXYZINE HCL 25 MG PO TABS
25.0000 mg | ORAL_TABLET | Freq: Two times a day (BID) | ORAL | Status: DC | PRN
  Filled 2012-11-28: qty 1

## 2012-11-28 MED ORDER — DULOXETINE HCL 30 MG PO CPEP
30.0000 mg | ORAL_CAPSULE | Freq: Every morning | ORAL | Status: DC
  Administered 2012-11-29 – 2012-12-04 (×6): 30 mg via ORAL
  Filled 2012-11-28 (×6): qty 1

## 2012-11-28 MED ORDER — ACETAMINOPHEN 325 MG PO TABS
650.0000 mg | ORAL_TABLET | Freq: Once | ORAL | Status: AC
  Administered 2012-11-28: 650 mg via ORAL
  Filled 2012-11-28: qty 2

## 2012-11-28 MED ORDER — METRONIDAZOLE IN NACL 5-0.79 MG/ML-% IV SOLN
500.0000 mg | Freq: Once | INTRAVENOUS | Status: AC
  Administered 2012-11-28: 500 mg via INTRAVENOUS
  Filled 2012-11-28 (×2): qty 100

## 2012-11-28 NOTE — Telephone Encounter (Signed)
Patient's daughter called and spoke with RN.  Per RN to take patient off schedule due to patient going to hospital by EMS

## 2012-11-28 NOTE — ED Notes (Addendum)
Per EMS, Pt, from home, c/o severe n/v/d since 0400.  Denies pain.  Vitals are stable.  NS and 4mg  Zofran given in route, with some relief.

## 2012-11-28 NOTE — Progress Notes (Signed)
Clinical Social Work Department BRIEF PSYCHOSOCIAL ASSESSMENT 11/28/2012  Patient:  [name], [name]     Account Number:  [id number]     Admit date:  11/28/2012  Clinical Social Worker:  Doree Albee  Date/Time:  11/28/2012 04:45 PM  Referred by:  RN  Date Referred:  11/28/2012 Referred for  Advanced Directives   Other Referral:   Interview type:  Patient Other interview type:   and patient family    PSYCHOSOCIAL DATA Living Status:  ALONE Admitted from facility:   Level of care:   Primary support name:  [name] Primary support relationship to patient:  CHILD, ADULT Degree of support available:   and [name] son  Dennie Bible Miller-sister  [name] son in law    CURRENT CONCERNS Current Concerns  Other - See comment   Other Concerns:   Advanced directives    SOCIAL WORK ASSESSMENT / PLAN CSW met with pt and pt family at bedside. CSW introduced self and csw role. CSW and pt discussed advanced directives as requested by patient. CSW and patient discussed the role of hcpoa and living will. Pt plans to review further with patient family and will inform rn to contact unit csw when patient wishes to complete. Pt verbalized understanding of needing to sign with witnesses in front of a notary. Pt verbalized understanding of completing during hosptialization with notary available or with community notary. Pt expressed desire to have unit csw to follow up with patient tomorrow regarding advanced directives.   Assessment/plan status:  Psychosocial Support/Ongoing Assessment of Needs Other assessment/ plan:   Information/referral to community resources:   advanced directives    PATIENT'S/FAMILY'S RESPONSE TO PLAN OF CARE: Pt thanked csw for concern and support. Pt plans to inform RN when patient would like to complete advanced directive with social work and Conservation officer, nature. Patient agreed to inform rn if patient had questions regarding advanced directives.     Catha Gosselin, LCSWA  [phone] .11/28/2012 1640

## 2012-11-28 NOTE — ED Notes (Signed)
ZOX:[id number]<AV> Expected date:11/28/12<BR> Expected time: 8:33 AM<BR> Means of arrival:Ambulance<BR> Comments:<BR> N/v/d

## 2012-11-28 NOTE — Progress Notes (Signed)
ANTIBIOTIC CONSULT NOTE - INITIAL  Pharmacy Consult for Ciprofloxacin/Flagyl  Indication: SIRs/R/o intra-abd infection   Allergies  Allergen Reactions  . Trazodone And Nefazodone Other (See Comments)    nightmares  . Hydromorphone Itching  . Tramadol Itching  . Morphine And Related Rash  . Penicillins Rash    Patient Measurements: Height: 4' 11.06" (150 cm) Weight: 123 lb 0.3 oz (55.8 kg) IBW/kg (Calculated) : 43.33   Vital Signs: Temp: 102.4 F (39.1 C) (02/11 1500) Temp src: Rectal (02/11 1500) BP: 119/62 mmHg (02/11 1515) Pulse Rate: 107 (02/11 1515) Intake/Output from previous day:   Intake/Output from this shift:    Labs:  Recent Labs  11/28/12 0945  WBC 3.5*  HGB 8.1*  PLT 81*  CREATININE 1.70*   Estimated Creatinine Clearance: 25.2 ml/min (by C-G formula based on Cr of 1.7). No results found for this basename: VANCOTROUGH, VANCOPEAK, VANCORANDOM, GENTTROUGH, GENTPEAK, GENTRANDOM, TOBRATROUGH, TOBRAPEAK, TOBRARND, AMIKACINPEAK, AMIKACINTROU, AMIKACIN,  in the last 72 hours   Microbiology: No results found for this or any previous visit (from the past 720 hour(s)).  Medical History: Past Medical History  Diagnosis Date  . Myeloma   . Anxiety   . Hypothyroidism   . GERD (gastroesophageal reflux disease)   . Myeloma   . OAB (overactive bladder)   . COPD (chronic obstructive pulmonary disease)   . Osteoporosis   . Depression   . Chronic pain   . Renal insufficiency     Medications:  Scheduled:  . [COMPLETED] acetaminophen  650 mg Oral Once  . [COMPLETED] fentaNYL  50 mcg Intravenous Once  . [COMPLETED] ondansetron (ZOFRAN) IV  4 mg Intravenous Once  . [COMPLETED] pantoprazole (PROTONIX) IV  40 mg Intravenous Once  . [START ON 11/29/2012] pneumococcal 23 valent vaccine  0.5 mL Intramuscular Tomorrow-1000  . [COMPLETED] promethazine  12.5 mg Intravenous Once   Infusions:  . sodium chloride 500 mL/hr at 11/28/12 1130  . [COMPLETED] sodium  chloride Stopped (11/28/12 1127)  . [COMPLETED] sodium chloride Stopped (11/28/12 1329)   PRN:  Assessment:  67 yo F with Nausea/vomiting/Diarrhea, meets SIRS criteria starting Cipro and Flagyl per pharmacy.    Scr eleaved, 1.7 with CrCl ~ 25 ml/min - will adjust abx accordingly  WBC wnl/low, Plt low - Hx of Myeloma - C5D2 Bendamustine completed on 2/5  Goal of Therapy:  Cipro/flagyl per renal function   Plan:  1.) Cipro 400 mg IV q24h 2.) Flagyl 500 mg IV q8h  3.) Monitor renal function  4.) F/u Cultures   Christle Nolting, Loma Messing PharmD Pager #: [phone] 4:07 PM 11/28/2012

## 2012-11-28 NOTE — ED Provider Notes (Signed)
History     CSN: [id number]  Arrival date & time 11/28/12  1191   First MD Initiated Contact with Patient 11/28/12 [phone]      Chief Complaint  Patient presents with  . Nausea  . Emesis  . Diarrhea    (Consider location/radiation/quality/duration/timing/severity/associated sxs/prior treatment) HPI.... level V caveat for severe illness.  Nausea, vomiting, and diarrhea for several hours prior to admission.  Patient has long-standing multiple myeloma. She feels very weak.  No fever, chills, dysuria.  Severity is moderate to severe  Past Medical History  Diagnosis Date  . Myeloma   . Anxiety   . Hypothyroidism   . GERD (gastroesophageal reflux disease)   . Myeloma   . OAB (overactive bladder)   . COPD (chronic obstructive pulmonary disease)   . Osteoporosis   . Depression   . Chronic pain   . Renal insufficiency     Past Surgical History  Procedure Laterality Date  . Leg surgery    . Cholecystectomy    . Tonsillectomy    . Breast surgery    . Limbal stem cell transplant    . Abdominal hysterectomy    . Esophagogastroduodenoscopy  06/11/2012    Procedure: ESOPHAGOGASTRODUODENOSCOPY (EGD);  Surgeon: Willis Modena, MD;  Location: [facility];  Service: Endoscopy;  Laterality: N/A;    No family history on file.  History  Substance Use Topics  . Smoking status: Never Smoker   . Smokeless tobacco: Never Used  . Alcohol Use: No    OB History   Grav Para Term Preterm Abortions TAB SAB Ect Mult Living                  Review of Systems  Unable to perform ROS: Acuity of condition    Allergies  Trazodone and nefazodone; Hydromorphone; Tramadol; Morphine and related; and Penicillins  Home Medications   Current Outpatient Rx  Name  Route  Sig  Dispense  Refill  . acyclovir (ZOVIRAX) 400 MG tablet   Oral   Take 400 mg by mouth 2 (two) times daily.         Marland Kitchen ALPRAZolam (XANAX) 0.25 MG tablet   Oral   Take 0.25 mg by mouth 3 (three) times daily as needed.  For anxiety or sleep.         Marland Kitchen Alum & Mag Hydroxide-Simeth (MAGIC MOUTHWASH) SOLN   Oral   Take 5 mLs by mouth 4 (four) times daily as needed ([facility] formula--no Simethicone. For sores in mouth.).          Marland Kitchen benzonatate (TESSALON) 100 MG capsule   Oral   Take 100 mg by mouth daily as needed. For cough.         . cyclobenzaprine (FLEXERIL) 5 MG tablet   Oral   Take 5 mg by mouth 3 (three) times daily as needed. For muscle spasms.         . DETROL LA 4 MG 24 hr capsule   Oral   Take 4 mg by mouth every morning.          . diphenoxylate-atropine (LOMOTIL) 2.5-0.025 MG per tablet   Oral   Take 1 tablet by mouth 4 (four) times daily as needed. For diarrhea or loose stools.         . DULoxetine (CYMBALTA) 30 MG capsule   Oral   Take 30 mg by mouth every morning.          Marland Kitchen esomeprazole (NEXIUM) 40 MG capsule  Oral   Take 40 mg by mouth every morning.          . hydrOXYzine (ATARAX/VISTARIL) 25 MG tablet   Oral   Take 25 mg by mouth 2 (two) times daily as needed. For itching.         . levothyroxine (SYNTHROID, LEVOTHROID) 50 MCG tablet   Oral   Take 50 mcg by mouth every morning.          Marland Kitchen LORazepam (ATIVAN) 0.5 MG tablet   Oral   Take 0.5 mg by mouth every 8 (eight) hours as needed. For anxiety.         . metaxalone (SKELAXIN) 800 MG tablet   Oral   Take 400 mg by mouth 3 (three) times daily as needed. For muscle pain.         . naproxen sodium (ALEVE) 220 MG tablet   Oral   Take 440 mg by mouth every 8 (eight) hours as needed (For pain.).         Marland Kitchen nystatin (MYCOSTATIN) 100000 UNIT/ML suspension   Oral   Take 500,000 Units by mouth 4 (four) times daily as needed. For sores in mouth.         Marland Kitchen olopatadine (PATANOL) 0.1 % ophthalmic solution   Both Eyes   Place 1 drop into both eyes 2 (two) times daily as needed (For allergies.).          Marland Kitchen ondansetron (ZOFRAN) 8 MG tablet   Oral   Take 8 mg by mouth every 8 (eight) hours as  needed. For nausea.         Marland Kitchen oxyCODONE (OXY IR/ROXICODONE) 5 MG immediate release tablet   Oral   Take 5-10 mg by mouth every 6 (six) hours as needed. For pain.         . pirbuterol (MAXAIR) 200 MCG/INH inhaler   Inhalation   Inhale 1-2 puffs into the lungs every 12 (twelve) hours as needed. For wheezing or shortness of breath.         . promethazine (PHENERGAN) 25 MG tablet   Oral   Take 12.5 mg by mouth every 6 (six) hours as needed (For nausea or vomiting.).         Marland Kitchen temazepam (RESTORIL) 30 MG capsule   Oral   Take 30 mg by mouth at bedtime.         . Vitamin D, Ergocalciferol, (DRISDOL) 50000 UNITS CAPS   Oral   Take 50,000 Units by mouth every Monday.           BP 127/48  Pulse 104  Temp(Src) 99.6 F (37.6 C) (Rectal)  Resp 18  SpO2 97%  Physical Exam  Nursing note and vitals reviewed. Constitutional: She is oriented to person, place, and time.  Pale, dehydrated  HENT:  Head: Normocephalic and atraumatic.  Eyes: Conjunctivae and EOM are normal. Pupils are equal, round, and reactive to light.  Neck: Normal range of motion. Neck supple.  Cardiovascular: Normal rate, regular rhythm and normal heart sounds.   Pulmonary/Chest: Effort normal and breath sounds normal.  Abdominal: Soft. Bowel sounds are normal.  Musculoskeletal: Normal range of motion.  Neurological: She is alert and oriented to person, place, and time.  Skin: Skin is warm and dry.  Psychiatric: She has a normal mood and affect.    ED Course  Procedures (including critical care time)  Labs Reviewed  CBC WITH DIFFERENTIAL - Abnormal; Notable for the following:    WBC 3.5 (*)  RBC 2.29 (*)    Hemoglobin 8.1 (*)    HCT 24.8 (*)    MCV 108.3 (*)    MCH 35.4 (*)    RDW 21.1 (*)    Platelets 81 (*)    Neutrophils Relative 90 (*)    Lymphocytes Relative 3 (*)    Lymphs Abs 0.1 (*)    All other components within normal limits  BASIC METABOLIC PANEL - Abnormal; Notable for the  following:    Glucose, Bld 127 (*)    BUN 26 (*)    Creatinine, Ser 1.70 (*)    GFR calc non Af Amer 30 (*)    GFR calc Af Amer 35 (*)    All other components within normal limits  URINALYSIS, ROUTINE W REFLEX MICROSCOPIC - Abnormal; Notable for the following:    APPearance CLOUDY (*)    Hgb urine dipstick TRACE (*)    Protein, ur 30 (*)    Leukocytes, UA MODERATE (*)    All other components within normal limits  URINE MICROSCOPIC-ADD ON - Abnormal; Notable for the following:    Squamous Epithelial / LPF FEW (*)    Bacteria, UA FEW (*)    All other components within normal limits  URINE CULTURE   No results found.   1. Gastroenteritis    CRITICAL CARE Performed by: Donnetta Hutching   Total critical care time: 30  Critical care time was exclusive of separately billable procedures and treating other patients.  Critical care was necessary to treat or prevent imminent or life-threatening deterioration.  Critical care was time spent personally by me on the following activities: development of treatment plan with patient and/or surrogate as well as nursing, discussions with consultants, evaluation of patient's response to treatment, examination of patient, obtaining history from patient or surrogate, ordering and performing treatments and interventions, ordering and review of laboratory studies, ordering and review of radiographic studies, pulse oximetry and re-evaluation of patient's condition.   MDM  Patient appears dehydrated. IV hydration initiated. Hemoglobin 8.1 noted.   Admit to hospitalist.        Donnetta Hutching, MD 11/28/12 1459

## 2012-11-28 NOTE — H&P (Signed)
[facility] History and Physical  Maryori Weide WUX:[id number] DOB: 11-19-1945 DOA: 11/28/2012  Referring physician: Dr. Adriana Simas PCP: Letitia Libra, Ala Dach, MD  Specialists: none but sees Dr. Myna Hidalgo for MM  Chief Complaint: nausea, emesis, diarrhea  HPI: Joslin Doell is a 67 y.o. female  With history of MM followed by Dr. Myna Hidalgo present to the ED due to nausea, emesis and diarrhea with reports of > 8 bouts of each.  Reportedly family members have come down with the similar symptoms.  Patient reports that last night she developed the symptoms.  Prior to her developing the symptoms she reports that she had eaten a salad she had prepared and denies eating any food that had been laying out for awhile.  She also endorses fevers.  Denies any blood or bile in emesis. No bloody diarrhea.  The problem since onset has been persistent and getting worse.  The phenergan in the ED has helped and she has felt better after getting IVF's.    In the ED patient had elevated HR at 107 improved after IVF rehydration, WBC of 3.5, Temp of 102.4. In ED no antibiotics were administered no blood cultures obtained.  We were consulted for further evaluation and recommendations given persistent nausea, emesis, and diarrhea  Review of Systems: 10 point review of system reviewed and negative unless otherwise states above  Past Medical History  Diagnosis Date  . Myeloma   . Anxiety   . Hypothyroidism   . GERD (gastroesophageal reflux disease)   . Myeloma   . OAB (overactive bladder)   . COPD (chronic obstructive pulmonary disease)   . Osteoporosis   . Depression   . Chronic pain   . Renal insufficiency    Past Surgical History  Procedure Laterality Date  . Leg surgery    . Cholecystectomy    . Tonsillectomy    . Breast surgery    . Limbal stem cell transplant    . Abdominal hysterectomy    . Esophagogastroduodenoscopy  06/11/2012    Procedure: ESOPHAGOGASTRODUODENOSCOPY (EGD);  Surgeon: Willis Modena, MD;  Location: [facility];  Service: Endoscopy;  Laterality: N/A;   Social History:  reports that she has never smoked. She has never used smokeless tobacco. She reports that she does not drink alcohol or use illicit drugs. Patient lives at home Can patient participate in ADLs? yes  Allergies  Allergen Reactions  . Trazodone And Nefazodone Other (See Comments)    nightmares  . Hydromorphone Itching  . Tramadol Itching  . Morphine And Related Rash  . Penicillins Rash    No family history on file. None reported  Prior to Admission medications   Medication Sig Start Date End Date Taking? Authorizing Provider  acyclovir (ZOVIRAX) 400 MG tablet Take 400 mg by mouth 2 (two) times daily. 10/05/12  Yes Josph Macho, MD  ALPRAZolam Prudy Feeler) 0.25 MG tablet Take 0.25 mg by mouth 3 (three) times daily as needed. For anxiety or sleep.   Yes Historical Provider, MD  Alum & Mag Hydroxide-Simeth (MAGIC MOUTHWASH) SOLN Take 5 mLs by mouth 4 (four) times daily as needed ([facility] formula--no Simethicone. For sores in mouth.).    Yes Historical Provider, MD  benzonatate (TESSALON) 100 MG capsule Take 100 mg by mouth daily as needed. For cough.   Yes Historical Provider, MD  cyclobenzaprine (FLEXERIL) 5 MG tablet Take 5 mg by mouth 3 (three) times daily as needed. For muscle spasms. 08/14/12  Yes Historical Provider, MD  DETROL LA 4  MG 24 hr capsule Take 4 mg by mouth every morning.  08/03/12  Yes Historical Provider, MD  diphenoxylate-atropine (LOMOTIL) 2.5-0.025 MG per tablet Take 1 tablet by mouth 4 (four) times daily as needed. For diarrhea or loose stools.   Yes Historical Provider, MD  DULoxetine (CYMBALTA) 30 MG capsule Take 30 mg by mouth every morning.    Yes Historical Provider, MD  esomeprazole (NEXIUM) 40 MG capsule Take 40 mg by mouth every morning.    Yes Historical Provider, MD  hydrOXYzine (ATARAX/VISTARIL) 25 MG tablet Take 25 mg by mouth 2 (two) times daily as needed. For  itching. 08/15/12  Yes Josph Macho, MD  levothyroxine (SYNTHROID, LEVOTHROID) 50 MCG tablet Take 50 mcg by mouth every morning.    Yes Historical Provider, MD  LORazepam (ATIVAN) 0.5 MG tablet Take 0.5 mg by mouth every 8 (eight) hours as needed. For anxiety.   Yes Historical Provider, MD  metaxalone (SKELAXIN) 800 MG tablet Take 400 mg by mouth 3 (three) times daily as needed. For muscle pain.   Yes Historical Provider, MD  naproxen sodium (ALEVE) 220 MG tablet Take 440 mg by mouth every 8 (eight) hours as needed (For pain.).   Yes Historical Provider, MD  nystatin (MYCOSTATIN) 100000 UNIT/ML suspension Take 500,000 Units by mouth 4 (four) times daily as needed. For sores in mouth.   Yes Historical Provider, MD  olopatadine (PATANOL) 0.1 % ophthalmic solution Place 1 drop into both eyes 2 (two) times daily as needed (For allergies.).    Yes Historical Provider, MD  ondansetron (ZOFRAN) 8 MG tablet Take 8 mg by mouth every 8 (eight) hours as needed. For nausea.   Yes Historical Provider, MD  oxyCODONE (OXY IR/ROXICODONE) 5 MG immediate release tablet Take 5-10 mg by mouth every 6 (six) hours as needed. For pain. 09/21/12  Yes Josph Macho, MD  pirbuterol (MAXAIR) 200 MCG/INH inhaler Inhale 1-2 puffs into the lungs every 12 (twelve) hours as needed. For wheezing or shortness of breath. 08/15/12  Yes Josph Macho, MD  promethazine (PHENERGAN) 25 MG tablet Take 12.5 mg by mouth every 6 (six) hours as needed (For nausea or vomiting.).   Yes Historical Provider, MD  temazepam (RESTORIL) 30 MG capsule Take 30 mg by mouth at bedtime. 06/23/12  Yes Josph Macho, MD  Vitamin D, Ergocalciferol, (DRISDOL) 50000 UNITS CAPS Take 50,000 Units by mouth every Monday.   Yes Historical Provider, MD   Physical Exam: Filed Vitals:   11/28/12 1315 11/28/12 1330 11/28/12 1430 11/28/12 1500  BP: 119/49 123/48 127/48   Pulse: 99 98 104   Temp:    102.4 F (39.1 C)  TempSrc:    Rectal  Resp: 21 19 18    SpO2:  97% 96% 97%      General:  Pt in NAD, Alert and Awake, nontoxic appearing  Eyes: EOMI, non icteric   ENT: normal exterior appearance, no masses  Neck: supple, no goiter  Cardiovascular: RRR, No mrg  Respiratory: CTA BL, no wheezes  Abdomen: soft, ND, + bowel sounds  Skin: warm, no rashes  Musculoskeletal: no cyanosis and clubbing  Psychiatric: mood and affect appropriate  Neurologic: no facial asymmetry, answers questions appropriately  Labs on Admission:  Basic Metabolic Panel:  Recent Labs Lab 11/28/12 0945  NA 139  K 4.0  CL 105  CO2 22  GLUCOSE 127*  BUN 26*  CREATININE 1.70*  CALCIUM 8.4   Liver Function Tests: No results found for this basename:  AST, ALT, ALKPHOS, BILITOT, PROT, ALBUMIN,  in the last 168 hours No results found for this basename: LIPASE, AMYLASE,  in the last 168 hours No results found for this basename: AMMONIA,  in the last 168 hours CBC:  Recent Labs Lab 11/28/12 0945  WBC 3.5*  NEUTROABS 3.2  HGB 8.1*  HCT 24.8*  MCV 108.3*  PLT 81*   Cardiac Enzymes: No results found for this basename: CKTOTAL, CKMB, CKMBINDEX, TROPONINI,  in the last 168 hours  BNP (last 3 results) No results found for this basename: PROBNP,  in the last 8760 hours CBG: No results found for this basename: GLUCAP,  in the last 168 hours  Radiological Exams on Admission: No results found.  Assessment/Plan Principal Problem:   Nausea vomiting and diarrhea Active Problems:   Myeloma   Anemia in chronic renal disease   Renal insufficiency   Hypothyroidism   GERD (gastroesophageal reflux disease)   Asthma   1. Nausea/vomiting/diarrhea - suspect viral gastroenteritis - does meet sirs criteria - obtain blood cultures - place on cipro and flagyl due to sirs criteria although at this juncture I suspect that this is mainly due to a viral gastroenteritis given sick contacts with similar symptoms.  - stools cultures - blood cultures - MIVFs - check  magnesium and phosphorus given above complaint - patient appears non toxic and will place on telemetry monitoring.  Should condition deteriorate will transfer to stepdown.  2. Hypothyroidism - Will plan on continuing home regimen - check tsh levels given dirrhea  3. Myeloma - will have patient f/u with her oncologist after resolution of # 1  4. Anemia - Continue to monitor and recheck levels next am - anemia panel  5. Renal insufficiency - At baseline currently - continue to monitor creatinine  6. Asthma - continue albuterol -  At baseline  Code Status: full Family Communication: Spoke to patient and family member at bedside Disposition Plan: Pending improvement in clinical condition  Time spent: > 60 minutes  Penny Pia [facility] Pager [phone]  If 7PM-7AM, please contact night-coverage [email or web address] Password [facility] 11/28/2012, 3:15 PM

## 2012-11-29 ENCOUNTER — Inpatient Hospital Stay: Payer: Medicare Other

## 2012-11-29 ENCOUNTER — Other Ambulatory Visit: Payer: Medicare Other | Admitting: Lab

## 2012-11-29 DIAGNOSIS — D61818 Other pancytopenia: Secondary | ICD-10-CM

## 2012-11-29 DIAGNOSIS — D630 Anemia in neoplastic disease: Secondary | ICD-10-CM

## 2012-11-29 DIAGNOSIS — B9789 Other viral agents as the cause of diseases classified elsewhere: Secondary | ICD-10-CM

## 2012-11-29 DIAGNOSIS — D649 Anemia, unspecified: Secondary | ICD-10-CM

## 2012-11-29 LAB — IRON AND TIBC
Iron: 36 ug/dL — ABNORMAL LOW (ref 42–135)
Saturation Ratios: 22 % (ref 20–55)
TIBC: 135 ug/dL — ABNORMAL LOW (ref 250–470)

## 2012-11-29 LAB — DIFFERENTIAL
Basophils Absolute: 0 10*3/uL (ref 0.0–0.1)
Basophils Relative: 0 % (ref 0–1)
Lymphocytes Relative: 8 % — ABNORMAL LOW (ref 12–46)
Lymphs Abs: 0.1 10*3/uL — ABNORMAL LOW (ref 0.7–4.0)
Monocytes Relative: 7 % (ref 3–12)
Neutro Abs: 1.1 10*3/uL — ABNORMAL LOW (ref 1.7–7.7)

## 2012-11-29 LAB — RETICULOCYTES
RBC.: 1.83 MIL/uL — ABNORMAL LOW (ref 3.87–5.11)
Retic Count, Absolute: 62.2 10*3/uL (ref 19.0–186.0)
Retic Ct Pct: 3.4 % — ABNORMAL HIGH (ref 0.4–3.1)

## 2012-11-29 LAB — HEMOGLOBIN AND HEMATOCRIT, BLOOD
HCT: 27.3 % — ABNORMAL LOW (ref 36.0–46.0)
Hemoglobin: 9 g/dL — ABNORMAL LOW (ref 12.0–15.0)

## 2012-11-29 LAB — CBC
HCT: 20.3 % — ABNORMAL LOW (ref 36.0–46.0)
Hemoglobin: 6.5 g/dL — CL (ref 12.0–15.0)
Hemoglobin: 6.4 g/dL — CL (ref 12.0–15.0)
MCH: 35.3 pg — ABNORMAL HIGH (ref 26.0–34.0)
MCHC: 32 g/dL (ref 30.0–36.0)
RDW: 21.7 % — ABNORMAL HIGH (ref 11.5–15.5)
RDW: 21.8 % — ABNORMAL HIGH (ref 11.5–15.5)
WBC: 1.3 10*3/uL — CL (ref 4.0–10.5)

## 2012-11-29 LAB — PREPARE RBC (CROSSMATCH)

## 2012-11-29 LAB — BASIC METABOLIC PANEL
BUN: 19 mg/dL (ref 6–23)
Calcium: 6.9 mg/dL — ABNORMAL LOW (ref 8.4–10.5)
GFR calc Af Amer: 35 mL/min — ABNORMAL LOW (ref >90)
GFR calc non Af Amer: 30 mL/min — ABNORMAL LOW (ref >90)
Glucose, Bld: 87 mg/dL (ref 70–99)
Sodium: 137 mEq/L (ref 135–145)

## 2012-11-29 LAB — DIC (DISSEMINATED INTRAVASCULAR COAGULATION)PANEL
INR: 1.14 (ref 0.00–1.49)
Smear Review: NONE SEEN
aPTT: 41 seconds — ABNORMAL HIGH (ref 24–37)

## 2012-11-29 LAB — FERRITIN: Ferritin: 4558 ng/mL — ABNORMAL HIGH (ref 10–291)

## 2012-11-29 LAB — FOLATE: Folate: 6 ng/mL

## 2012-11-29 MED ORDER — DIPHENHYDRAMINE HCL 50 MG/ML IJ SOLN
25.0000 mg | Freq: Once | INTRAMUSCULAR | Status: AC
  Administered 2012-11-29: 25 mg via INTRAVENOUS
  Filled 2012-11-29: qty 1

## 2012-11-29 MED ORDER — SODIUM CHLORIDE 0.9 % IJ SOLN
10.0000 mL | INTRAMUSCULAR | Status: DC | PRN
  Administered 2012-11-29: 20 mL
  Administered 2012-11-29 – 2012-12-02 (×2): 10 mL

## 2012-11-29 MED ORDER — DIPHENHYDRAMINE HCL 25 MG PO CAPS
25.0000 mg | ORAL_CAPSULE | ORAL | Status: DC | PRN
  Administered 2012-11-30 – 2012-12-04 (×6): 25 mg via ORAL
  Filled 2012-11-29 (×6): qty 1

## 2012-11-29 MED ORDER — ENSURE COMPLETE PO LIQD
237.0000 mL | Freq: Two times a day (BID) | ORAL | Status: DC
  Administered 2012-11-30 – 2012-12-04 (×8): 237 mL via ORAL

## 2012-11-29 MED ORDER — POTASSIUM CHLORIDE 10 MEQ/50ML IV SOLN
10.0000 meq | INTRAVENOUS | Status: AC
  Administered 2012-11-29 (×4): 10 meq via INTRAVENOUS
  Filled 2012-11-29 (×4): qty 50

## 2012-11-29 MED ORDER — ACETAMINOPHEN 325 MG PO TABS
650.0000 mg | ORAL_TABLET | ORAL | Status: DC | PRN
  Administered 2012-11-29 – 2012-12-02 (×4): 650 mg via ORAL
  Filled 2012-11-29 (×5): qty 2

## 2012-11-29 MED ORDER — ACETAMINOPHEN 325 MG PO TABS
650.0000 mg | ORAL_TABLET | Freq: Once | ORAL | Status: AC
  Administered 2012-11-29: 650 mg via ORAL

## 2012-11-29 NOTE — Consult Note (Signed)
[name], ARMOR NO.:  [id number]  MEDICAL RECORD NO.:  [id number]  LOCATION:  1427                         FACILITY:  [facility]  PHYSICIAN:  Josph Macho, M.D.  DATE OF BIRTH:  07/22/1946  DATE OF CONSULTATION:  11/29/2012 DATE OF DISCHARGE:                                CONSULTATION   REFERRING PHYSICIAN:  Dr. Malachi Bonds.  REASON FOR CONSULTATION: 1. Refractory kappa light chain myeloma. 2. Chronic renal insufficiency. 3. 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  HISTORY OF PRESENT ILLNESS:  [name] is well known to me.  She is 67 years old.  She has refractory kappa light chain myeloma.  She actually was diagnosed with myeloma probably about 25 years ago.  She is the one of the first patient that would have a, I think stem-cell transplant for this.  Her disease has come back over the past few years.  We were treating her.  We started to get down to limited treatment options for her.  We now have her on truly salvage therapy with bendamustine/Velcade/Decadron.  She had a bone marrow biopsy done about a month ago.  This showed stable disease within the bone marrow.  Of note, her myeloma does not produce much in the way of light chains that we can measure in the serum.  She now is admitted because of what appears to be a viral gastroenteritis.  She was with family over the weekend.  Her daughters are sick.  I think another relative is sick.  She was weepy.  She has had a lot of diarrhea.  When she was admitted, her hemoglobin was 8.1, hematocrit 24.8, platelet count was 81,000.  She has been rehydrated.  Her CBC today shows a white cell count of 1.3, hemoglobin 6.4, hematocrit 20.3, platelet count 57.  I suspect that she buys bone marrow suppression from this viral illness.  She has had no bleeding.  She did have temperature.  She has had lot of diarrhea.  Her C. diff is negative.  She has not had any obvious urination.  She did  have a urine culture done.  This is pending.  Her metabolic studies on admission showed a BUN of 26, and creatinine 1.7.  This is a little bit on the higher side for her.  Potassium is 4.0.  Today, her potassium is 3.1.  She actually looks a little better than I would have ever thought.  She does feel very tired.  She is not complaining of pain.  She has nausea.  She has basically on a I think clear or full liquid diet.  Unfortunately, despite multiple attempts were made to get a family doctor, she has never gotten one.  She has multiple medications.  She does have other health issues.  PAST MEDICAL HISTORY: 1. Remarkable for multiple myeloma. 2. Hypothyroidism. 3. COPD. 4. Chronic renal insufficiency. 5. Depression/anxiety. 6. Bladder instability.  ALLERGIES: 1. TRAZODONE. 2. DILAUDID. 3. PENICILLIN. 4. MORPHINE. 5. TRAMADOL.  HOME MEDICATIONS: 1. Acyclovir 400 mg p.o. b.i.d. 2. Xanax 0.25 mg p.o. t.i.d. p.r.n. 3. Tessalon Perles 100 mg p.o. daily p.r.n. 4. Detrol LA 4 mg p.o. daily. 5. Flexeril 5 mg p.o. t.i.d. p.r.n.  6. Cymbalta 30 mg p.o. daily. 7. Nexium 40 mg p.o. daily. 8. Atarax 25 mg p.o. b.i.d. p.r.n. 9. Synthroid 0.05 mg p.o. daily. 10.Patanol eyedrops 0.1% 1 drop both eyes b.i.d. 11.Albuterol inhaler 2 puffs q.4 h. p.r.n. 12.Restoril 30 mg p.o. at bedtime. 13.Vitamin D 16109 units daily.  SOCIAL HISTORY:  Negative for tobacco or alcohol use.  FAMILY HISTORY:  Noncontributory.  PHYSICAL EXAMINATION:  GENERAL:  This is a somewhat petite white female who is somewhat frail. VITAL SIGNS:  Temperature of 98.8, pulse 94, respiratory rate 18, blood pressure 105/59.  HEAD AND NECK:  She has a normocephalic, atraumatic skull.  There are no ocular or oral lesions.  There are no palpable cervical or supraclavicular lymph nodes.  Her oral mucosa is slightly dry. LUNGS:  With decreased breath sounds at the bases. CARDIAC:  Regular rate and rhythm with normal  S1, S2.  There are no murmurs, rubs, or bruits. ABDOMEN:  Soft and good bowel sounds.  There is no palpable abdominal mass.  There is no palpable hepatosplenomegaly. BACK:  No tenderness over the spine, ribs, or hips. EXTREMITIES:  Chronic changes in her thighs from past radiation and calcification of her muscles.  IMPRESSION:  [name] is a 67 year old white female with refractory kappa light chain myeloma.  I should mention that, we did a bone marrow biopsy a month ago, we did do cytogenetics.  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 This, I think is hardly consistent with her myeloma becoming much more refractory.  It does seem like she has a viral syndrome.  C. diff is negative which is helpful.  I think the pancytopenia is reflective of her recent infection.  There also could be a chemotherapy effect.  She got her last chemotherapy back on February 4th and 5th. Again, I suspect that the pancytopenia is reflective of this infection. Again, she has a viral infection, any stress on her body will cause her bone marrow to slow down.  I would just be supportive for right now. She does need to be transfused with 2 units of blood.  We will go ahead and follow her along.  I suspect, she will be hospitalized for good 5-7 days.  I did speak to her daughter, Victorino Dike, yesterday.  I did give her an update about her mom's myeloma and the cytogenetic problems that we are going to come across.  She definitely understands.     Josph Macho, M.D.     PRE/MEDQ  D:  11/29/2012  T:  11/29/2012  Job:  [id number]

## 2012-11-29 NOTE — Progress Notes (Signed)
Garrel Ridgel, OTR/L  Pager [phone] 11/29/2012

## 2012-11-29 NOTE — Progress Notes (Signed)
PT Cancellation Note  Patient Details Name: [name] MRN: [id number] DOB: 09/02/1946   Cancelled Treatment:    Reason Eval/Treat Not Completed: Medical issues which prohibited therapy Pt with Hgb 6.5 and currently receiving first unit of PRBCs.     Fabianna Keats,KATHrine E 11/29/2012, 1:31 PM Pager: [phone]

## 2012-11-29 NOTE — Consult Note (Signed)
#   [id number] is consult note.  Pete E.  2 Thessalonians 1:6-7

## 2012-11-29 NOTE — Progress Notes (Signed)
INITIAL NUTRITION ASSESSMENT  DOCUMENTATION CODES Per approved criteria  -Severe malnutrition in the context of chronic illness   Pt meets criteria for SEVERE MALNUTRITION in the context of chronic illness as evidenced by poor po intake for the past 6 months with 13% wt loss.  INTERVENTION: Provide Ensure BID  NUTRITION DIAGNOSIS: Unintentional wt loss related to decreased appetite with chemotherapy as evidenced by 13% wt loss in 6 months.   Goal: Pt to meet >/= 90% of their estimated nutrition needs  Monitor:  PO intake Wt  Reason for Assessment: MST score of 4  67 y.o. female  Admitting Dx: Nausea vomiting and diarrhea  ASSESSMENT: Pt presents with complaints of nausea, vomiting, and diarrhea for several hours PTA. Pt has history of myeloma, COPD, osteoporosis, hypothyroidism, and GERD. Pt reports that she weighed 145 lbs in July 2013 but started losing weight during chemotherapy due to decreased appetite. Pt states that she has been able to maintain 123 lbs recently but appetite continues to be poor. Pt reports she usually eats 2 meals daily at home and drinks Ensure occasionally when her daughter brings her some. Encouraged increased po intake to 3 meals daily and Ensure to prevent further wt loss during chemotherapy. Diet was advanced to regular diet this afternoon and pt was eating at bedside.  Height: Ht Readings from Last 1 Encounters:  11/28/12 4\' 11"  (1.499 m)    Weight: Wt Readings from Last 1 Encounters:  11/28/12 123 lb 8 oz (56.019 kg)    Ideal Body Weight: 95 lb  % Ideal Body Weight: 130%  Wt Readings from Last 10 Encounters:  11/28/12 123 lb 8 oz (56.019 kg)  11/14/12 123 lb (55.792 kg)  09/20/12 128 lb (58.06 kg)  08/23/12 133 lb (60.328 kg)  08/23/12 132 lb 12 oz (60.215 kg)  07/26/12 135 lb (61.236 kg)  06/05/12 140 lb 3.4 oz (63.6 kg)  06/05/12 140 lb 3.4 oz (63.6 kg)  06/05/12 141 lb (63.957 kg)  04/19/12 132 lb (59.875 kg)    Usual Body  Weight: 145 lb  % Usual Body Weight: 85%  BMI:  Body mass index is 24.93 kg/(m^2).  Estimated Nutritional Needs: Kcal: 1620-1850 Protein: 67-78 grams Fluid: >1.7 L/day  Skin: WDL  Diet Order: General  EDUCATION NEEDS: -No education needs identified at this time   Intake/Output Summary (Last 24 hours) at 11/29/12 1416 Last data filed at 11/29/12 1335  Gross per 24 hour  Intake 1577.5 ml  Output      0 ml  Net 1577.5 ml    Last BM: 2/11 diarrhea   Labs:   Recent Labs Lab 11/28/12 0945 11/28/12 1731 11/29/12 0515  NA 139  --  137  K 4.0  --  3.1*  CL 105  --  107  CO2 22  --  20  BUN 26*  --  19  CREATININE 1.70*  --  1.70*  CALCIUM 8.4  --  6.9*  MG  --  1.5  --   PHOS  --  2.8  --   GLUCOSE 127*  --  87    CBG (last 3)  No results found for this basename: GLUCAP,  in the last 72 hours  Scheduled Meds: . acyclovir  400 mg Oral BID  . ciprofloxacin  400 mg Intravenous Q24H  . DULoxetine  30 mg Oral q morning - 10a  . fesoterodine  8 mg Oral Daily  . levothyroxine  50 mcg Oral QAC breakfast  .  metronidazole  500 mg Intravenous Q8H  . psyllium  1 packet Oral Daily  . sodium chloride  3 mL Intravenous Q12H  . temazepam  30 mg Oral QHS  . [START ON 12/04/2012] Vitamin D (Ergocalciferol)  50,000 Units Oral Q Mon    Continuous Infusions: . sodium chloride 500 mL/hr at 11/28/12 1130  . dextrose 5 % and 0.45 % NaCl with KCl 20 mEq/L 75 mL/hr at 11/28/12 1843    Past Medical History  Diagnosis Date  . Myeloma   . Anxiety   . Hypothyroidism   . GERD (gastroesophageal reflux disease)   . Myeloma   . OAB (overactive bladder)   . COPD (chronic obstructive pulmonary disease)   . Osteoporosis   . Depression   . Chronic pain   . Renal insufficiency     Past Surgical History  Procedure Laterality Date  . Leg surgery    . Cholecystectomy    . Tonsillectomy    . Breast surgery    . Limbal stem cell transplant    . Abdominal hysterectomy    .  Esophagogastroduodenoscopy  06/11/2012    Procedure: ESOPHAGOGASTRODUODENOSCOPY (EGD);  Surgeon: Willis Modena, MD;  Location: [facility];  Service: Endoscopy;  Laterality: N/A;    Ian Malkin RD, LDN Inpatient Clinical Dietitian Pager: [phone] After Hours Pager: [phone]

## 2012-11-29 NOTE — Progress Notes (Signed)
   CARE MANAGEMENT NOTE 11/29/2012  Patient:  [name], [name]   Account Number:  [id number]  Date Initiated:  11/29/2012  Documentation initiated by:  Jiles Crocker  Subjective/Objective Assessment:   ADMITTED WITH VIRAL GASTROENTERITIS     Action/Plan:   PCP: Estill Cotta, MD  LIVES ALONE; POSSIBLY NEED HHC; AWATING FOR PT/OT EVALS   Anticipated DC Date:  12/06/2012   Anticipated DC Plan:  HOME W HOME HEALTH SERVICES      DC Planning Services  CM consult          Status of service:  In process, will continue to follow Medicare Important Message given?  NA - LOS <3 / Initial given by admissions (If response is "NO", the following Medicare IM given date fields will be blank)  Per UR Regulation:  Reviewed for med. necessity/level of care/duration of stay  Comments:  11/29/2012- B Ceanna Wareing RN,BSN,MHA

## 2012-11-29 NOTE — Progress Notes (Signed)
CRITICAL VALUE ALERT  Critical value received:  Hgb 6.5  Date of notification:  11/29/2012  Time of notification:  0615  Critical value read back:yes  Nurse who received alert:  Judi Cong, RN   MD notified (1st page):  Schorr, NP  Time of first page:  [phone]  MD notified (2nd page):  Time of second page:  Responding MD:  Clearence Ped, NP  Time MD responded:  [phone]

## 2012-11-29 NOTE — Progress Notes (Signed)
[facility] PROGRESS NOTE  [name] JXB:[id number] DOB: 1946-06-20 DOA: 11/28/2012 PCP: Estill Cotta, MD  Assessment/Plan  Nausea/vomiting/diarrhea, nausea and vomiting, but refractory diarrhea -  C. Diff negative -  Stool culture pending - suspect viral gastroenteritis  - does meet sirs criteria  - blood cultures pending - Will touch base with ID regarding cipro and flagyl  - Continue MIVFs   2. Hypothyroidism, TSH 2.49 - Continue home regimen   3. Myeloma with developing pancytopenia, likely due to viral suppression - Appreciate heme/onc assistance  4. 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 Asthma  - continue albuterol  - At baseline    Diet:  Clear liquid, advance as tolerated to regular Access:  PIV IVF:  NS at 70ml/h Proph:  SCDs   Code Status: full code Family Communication: spoke with patient alone Disposition Plan: pending improvement in diarrhea, still incontinent of stool and having frequent BMs (not all recorded on I/O)   Consultants:  Hematology  Procedures:  none  Antibiotics:  Cipro 2/11 >>  Flagyl 2/11 >>   HPI/Subjective: Patient states that her nausea and vomiting have improved, but she is still having countless episodes of diarrhea and she is incontinent of stools.  Denies CP, SOB.  + fevers and chills  Objective: Filed Vitals:   11/28/12 1632 11/28/12 1700 11/28/12 2147 11/29/12 0618  BP: 103/46 115/55 107/81 100/44  Pulse: 98 97 113 63  Temp: 100.7 F (38.2 C) 99.8 F (37.7 C) 99.2 F (37.3 C) 101.3 F (38.5 C)  TempSrc: Oral Oral Oral Oral  Resp:  16 18 17   Height:  4\' 11"  (1.499 m)    Weight:  56.019 kg (123 lb 8 oz)    SpO2: 94% 95% 96% 92%    Intake/Output Summary (Last 24 hours) at 11/29/12 1045 Last data filed at 11/29/12 0845  Gross per 24 hour  Intake    1440 ml  Output      0 ml  Net   1440 ml   Filed Weights   11/28/12 1540 11/28/12 1700  Weight: 55.8 kg (123 lb 0.3 oz) 56.019 kg (123 lb 8 oz)    Exam:   General:  Caucasian female, no acute distress, lying in bed  HEENT:  MMM  Cardiovascular:  RRR, no mrg, 2+ pulses, warm extremities  Respiratory:  CTAB  Abdomen:  NABS, mildly distended, mildly tender diffusely without rebound or guarding   MSK:  Normal tone and bulk, no LEE  Neuro:  Grossly intact  Data Reviewed: Basic Metabolic Panel:  Recent Labs Lab 11/28/12 0945 11/28/12 1731 11/29/12 0515  NA 139  --  137  K 4.0  --  3.1*  CL 105  --  107  CO2 22  --  20  GLUCOSE 127*  --  87  BUN 26*  --  19  CREATININE 1.70*  --  1.70*  CALCIUM 8.4  --  6.9*  MG  --  1.5  --   PHOS  --  2.8  --    Liver Function Tests: No results found for this basename: AST, ALT, ALKPHOS, BILITOT, PROT, ALBUMIN,  in the last 168 hours No results found for this basename: LIPASE, AMYLASE,  in the last 168 hours No results found for this basename: AMMONIA,  in the last 168  hours CBC:  Recent Labs Lab 11/28/12 0945 11/29/12 0515  WBC 3.5* 1.6*  NEUTROABS 3.2  --   HGB 8.1* 6.5*  HCT 24.8* 20.3*  MCV 108.3* 110.3*  PLT 81* 59*   Cardiac Enzymes: No results found for this basename: CKTOTAL, CKMB, CKMBINDEX, TROPONINI,  in the last 168 hours BNP (last 3 results) No results found for this basename: PROBNP,  in the last 8760 hours CBG: No results found for this basename: GLUCAP,  in the last 168 hours  Recent Results (from the past 240 hour(s))  CULTURE, BLOOD (ROUTINE X 2)     Status: None   Collection Time    11/28/12  3:45 PM      Result Value Range Status   Specimen Description BLOOD LEFT ARM   Final   Special Requests BOTTLES DRAWN AEROBIC AND ANAEROBIC 5CC   Final   Culture  Setup Time 11/28/2012 20:36   Final   Culture     Final   Value:        BLOOD CULTURE RECEIVED NO GROWTH TO DATE CULTURE WILL BE HELD FOR 5  DAYS BEFORE ISSUING A FINAL NEGATIVE REPORT   Report Status PENDING   Incomplete  CULTURE, BLOOD (ROUTINE X 2)     Status: None   Collection Time    11/28/12  3:51 PM      Result Value Range Status   Specimen Description BLOOD LEFT HAND   Final   Special Requests BOTTLES DRAWN AEROBIC AND ANAEROBIC 5CC   Final   Culture  Setup Time 11/28/2012 20:37   Final   Culture     Final   Value:        BLOOD CULTURE RECEIVED NO GROWTH TO DATE CULTURE WILL BE HELD FOR 5 DAYS BEFORE ISSUING A FINAL NEGATIVE REPORT   Report Status PENDING   Incomplete  CLOSTRIDIUM DIFFICILE BY PCR     Status: None   Collection Time    11/28/12 10:56 PM      Result Value Range Status   C difficile by pcr NEGATIVE  NEGATIVE Final     Studies: No results found.  Scheduled Meds: . acyclovir  400 mg Oral BID  . ciprofloxacin  400 mg Intravenous Q24H  . DULoxetine  30 mg Oral q morning - 10a  . fesoterodine  8 mg Oral Daily  . levothyroxine  50 mcg Oral QAC breakfast  . metronidazole  500 mg Intravenous Q8H  . pneumococcal 23 valent vaccine  0.5 mL Intramuscular Tomorrow-1000  . potassium chloride  10 mEq Intravenous Q1 Hr x 4  . psyllium  1 packet Oral Daily  . sodium chloride  3 mL Intravenous Q12H  . temazepam  30 mg Oral QHS  . [START ON 12/04/2012] Vitamin D (Ergocalciferol)  50,000 Units Oral Q Mon   Continuous Infusions: . sodium chloride 500 mL/hr at 11/28/12 1130  . dextrose 5 % and 0.45 % NaCl with KCl 20 mEq/L 75 mL/hr at 11/28/12 1843    Principal Problem:   Nausea vomiting and diarrhea Active Problems:   Myeloma   Anemia in chronic renal disease   Renal insufficiency   Hypothyroidism   GERD (gastroesophageal reflux disease)   Asthma    Time spent: 30 min    Kelly Eisler, [facility]  [facility] Pager [phone]. If 8PM-8AM, please contact night-coverage at [email or web address], password [facility] 11/29/2012, 10:45 AM  LOS: 1 day

## 2012-11-30 LAB — TYPE AND SCREEN

## 2012-11-30 LAB — CBC
MCHC: 33.2 g/dL (ref 30.0–36.0)
Platelets: 54 10*3/uL — ABNORMAL LOW (ref 150–400)
RDW: 23.2 % — ABNORMAL HIGH (ref 11.5–15.5)
WBC: 2.1 10*3/uL — ABNORMAL LOW (ref 4.0–10.5)

## 2012-11-30 LAB — MAGNESIUM: Magnesium: 1.3 mg/dL — ABNORMAL LOW (ref 1.5–2.5)

## 2012-11-30 LAB — BASIC METABOLIC PANEL
BUN: 11 mg/dL (ref 6–23)
Chloride: 106 mEq/L (ref 96–112)
GFR calc Af Amer: 41 mL/min — ABNORMAL LOW (ref >90)
GFR calc non Af Amer: 35 mL/min — ABNORMAL LOW (ref >90)
Potassium: 2.9 mEq/L — ABNORMAL LOW (ref 3.5–5.1)
Sodium: 137 mEq/L (ref 135–145)

## 2012-11-30 MED ORDER — LOPERAMIDE HCL 2 MG PO CAPS
2.0000 mg | ORAL_CAPSULE | ORAL | Status: DC | PRN
  Administered 2012-11-30: 2 mg via ORAL
  Filled 2012-11-30: qty 1

## 2012-11-30 MED ORDER — MAGNESIUM SULFATE 40 MG/ML IJ SOLN
2.0000 g | Freq: Once | INTRAMUSCULAR | Status: AC
  Administered 2012-11-30: 2 g via INTRAVENOUS
  Filled 2012-11-30: qty 50

## 2012-11-30 MED ORDER — POTASSIUM CHLORIDE CRYS ER 20 MEQ PO TBCR
60.0000 meq | EXTENDED_RELEASE_TABLET | Freq: Once | ORAL | Status: AC
  Administered 2012-11-30: 60 meq via ORAL
  Filled 2012-11-30: qty 3

## 2012-11-30 MED ORDER — FERUMOXYTOL INJECTION 510 MG/17 ML
510.0000 mg | Freq: Once | INTRAVENOUS | Status: AC
  Administered 2012-11-30: 510 mg via INTRAVENOUS
  Filled 2012-11-30: qty 17

## 2012-11-30 MED ORDER — FLUCONAZOLE 150 MG PO TABS
150.0000 mg | ORAL_TABLET | Freq: Once | ORAL | Status: AC
  Administered 2012-11-30: 150 mg via ORAL
  Filled 2012-11-30: qty 1

## 2012-11-30 NOTE — Evaluation (Signed)
Physical Therapy Evaluation Patient Details Name: [name] MRN: [id number] DOB: Mar 04, 1946 Today's Date: 11/30/2012 Time: 7829-5621 PT Time Calculation (min): 21 min  PT Assessment / Plan / Recommendation Clinical Impression  67 y.o. female with h/o myeloma, COPD, and chronic pain admitted with N/V/D. Pt ambulated 25' with RW and min assist. Distance limited by pain/fatigue. Pt would benefit from acute PT to maximize safety and independence with mobility.     PT Assessment  Patient needs continued PT services    Follow Up Recommendations  Home health PT    Does the patient have the potential to tolerate intense rehabilitation      Barriers to Discharge None      Equipment Recommendations  None recommended by PT    Recommendations for Other Services OT consult   Frequency Min 3X/week    Precautions / Restrictions Precautions Precautions: None Restrictions Weight Bearing Restrictions: No   Pertinent Vitals/Pain *8/10 headache pain RN notified**      Mobility  Bed Mobility Bed Mobility: Not assessed Transfers Transfers: Sit to Stand;Stand to Sit Sit to Stand: 4: Min guard;From bed Stand to Sit: 4: Min guard;To chair/3-in-1;With armrests Ambulation/Gait Ambulation/Gait Assistance: 4: Min guard Ambulation Distance (Feet): 25 Feet Assistive device: Rolling walker Gait Pattern: Within Functional Limits General Gait Details: min/guard 2*  generalized weakness    Exercises     PT Diagnosis: Generalized weakness;Acute pain;Difficulty walking  PT Problem List: Decreased mobility;Decreased activity tolerance;Pain PT Treatment Interventions: Gait training;Functional mobility training;Therapeutic exercise;Patient/family education;Therapeutic activities   PT Goals Acute Rehab PT Goals PT Goal Formulation: With patient Time For Goal Achievement: 12/14/12 Potential to Achieve Goals: Good Pt will go Supine/Side to Sit: Independently PT Goal: Supine/Side to Sit -  Progress: Goal set today Pt will go Sit to Stand: with modified independence PT Goal: Sit to Stand - Progress: Goal set today Pt will Ambulate: 51 - 150 feet;with supervision;with rolling walker PT Goal: Ambulate - Progress: Goal set today Pt will Perform Home Exercise Program: Independently PT Goal: Perform Home Exercise Program - Progress: Goal set today  Visit Information  Last PT Received On: 11/30/12 Assistance Needed: +1    Subjective Data  Subjective: I don't feel great. I have a headache.    Prior Functioning  Home Living Lives With: Alone Available Help at Discharge: Family Type of Home: House Home Access: Level entry Home Layout: One level Bathroom Shower/Tub: Engineer, manufacturing systems: Standard Home Adaptive Equipment: Environmental consultant - four wheeled;Wheelchair - powered;Shower chair with back Prior Function Level of Independence: Independent with assistive device(s)    Cognition  Cognition Overall Cognitive Status: Appears within functional limits for tasks assessed/performed Arousal/Alertness: Awake/alert Orientation Level: Appears intact for tasks assessed Behavior During Session: [facility] for tasks performed    Extremity/Trunk Assessment Right Upper Extremity Assessment RUE ROM/Strength/Tone: [facility] for tasks assessed Left Upper Extremity Assessment LUE ROM/Strength/Tone: WFL for tasks assessed Right Lower Extremity Assessment RLE ROM/Strength/Tone: WFL for tasks assessed RLE Sensation: WFL - Light Touch RLE Coordination: WFL - gross/fine motor Left Lower Extremity Assessment LLE ROM/Strength/Tone: WFL for tasks assessed LLE Sensation: WFL - Light Touch LLE Coordination: WFL - gross/fine motor Trunk Assessment Trunk Assessment: Normal   Balance    End of Session PT - End of Session Activity Tolerance: Patient limited by fatigue;Patient limited by pain Patient left: in chair;with call bell/phone within reach Nurse Communication: Mobility status  GP      Tamala Ser 11/30/2012, 12:14 PM

## 2012-11-30 NOTE — Progress Notes (Addendum)
[facility] PROGRESS NOTE  [name] UJW:[id number] DOB: [birth year] DOA: 11/28/2012 PCP: Estill Cotta, MD  Assessment/Plan  Nausea/vomiting/diarrhea, nausea and vomiting persisting with minimal oral intake.  Diarrhea improving today with imodium.   -  C. Diff negative -  Stool culture pending - suspect viral gastroenteritis  - blood cultures NGTD - Continue cipro and flagyl pending results of stool culture - Continue MIVFs   2. Hypothyroidism, TSH 2.49 - Continue home regimen   Elevated D-dimer:  Likely related to acute infection in the setting of malignancy.  Was obtained in the setting of evaluating for DIC, which the patient clearly does not have.  She has no symptoms of PE and is not tachycardic.  Will monitor clinically.   3. Myeloma with developing pancytopenia, likely due to viral suppression - Appreciate heme/onc assistance  4. Anemia, improved yesterday after blood transfusion of 2 units and stable - anemia panel with low iron level, however, ferritin is elevated and saturation is normal.  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 Asthma  - continue albuterol  - At baseline   Vaginal discomfort, likely yeast infection in the setting of antibiotics -  Wet prep -  Fluconazole 150mg  po once  Diet:  Clear liquid, advance as tolerated to regular Access:  PIV IVF:  NS at 85ml/h Proph:  SCDs   Code Status: full code Family Communication: spoke with patient alone Disposition Plan: pending improvement in diarrhea, still incontinent of stool and having frequent BMs (not all recorded on I/O) and poor oral intake.  Will discuss home health PT.     Consultants:  Hematology  Procedures:  none  Antibiotics:  Cipro 2/11 >>  Flagyl 2/11 >>   HPI/Subjective: Patient states that her vomiting has improved, but she still has anorexia and nausea.  She had many episodes of  diarrhea with incontinence yesterday, but after starting imodium today, this is improved.  Denies CP, SOB.  + fevers and chills  Objective: Filed Vitals:   11/29/12 1650 11/29/12 2056 11/30/12 0621 11/30/12 1400  BP: 118/54 118/51 111/53 109/67  Pulse: 95 86 96 97  Temp: 99 F (37.2 C) 98.6 F (37 C) 98.6 F (37 C) 98.7 F (37.1 C)  TempSrc: Oral Oral Oral Oral  Resp: 20 18 18 20   Height:      Weight:      SpO2:  98% 97% 98%    Intake/Output Summary (Last 24 hours) at 11/30/12 1721 Last data filed at 11/30/12 1610  Gross per 24 hour  Intake   2565 ml  Output   1650 ml  Net    915 ml   Filed Weights   11/28/12 1540 11/28/12 1700  Weight: 55.8 kg (123 lb 0.3 oz) 56.019 kg (123 lb 8 oz)    Exam:   General:  Caucasian female, no acute distress, lying in bed  HEENT:  MMM  Cardiovascular:  RRR, no mrg, 2+ pulses, warm extremities  Respiratory:  CTAB  Abdomen:  Hyperactive BS, mildly distended, nontender.     MSK:  Normal tone and bulk, no LEE  Neuro:  Grossly intact  Data Reviewed: Basic Metabolic Panel:  Recent Labs Lab 11/28/12 0945 11/28/12 1731 11/29/12 0515 11/30/12 0815  NA 139  --  137 137  K 4.0  --  3.1* 2.9*  CL 105  --  107 106  CO2 22  --  20 19  GLUCOSE 127*  --  87 103*  BUN 26*  --  19 11  CREATININE 1.70*  --  1.70* 1.51*  CALCIUM 8.4  --  6.9* 6.8*  MG  --  1.5  --  1.3*  PHOS  --  2.8  --   --    Liver Function Tests: No results found for this basename: AST, ALT, ALKPHOS, BILITOT, PROT, ALBUMIN,  in the last 168 hours No results found for this basename: LIPASE, AMYLASE,  in the last 168 hours No results found for this basename: AMMONIA,  in the last 168 hours CBC:  Recent Labs Lab 11/28/12 0945 11/29/12 0515 11/29/12 1040 11/29/12 2000 11/30/12 0815  WBC 3.5* 1.6* 1.3*  --  2.1*  NEUTROABS 3.2  --  1.1*  --   --   HGB 8.1* 6.5* 6.4* 9.0* 9.6*  HCT 24.8* 20.3* 20.3* 27.3* 28.9*  MCV 108.3* 110.3* 110.9*  --  101.0*  PLT  81* 59* 57*  59*  --  54*   Cardiac Enzymes: No results found for this basename: CKTOTAL, CKMB, CKMBINDEX, TROPONINI,  in the last 168 hours BNP (last 3 results) No results found for this basename: PROBNP,  in the last 8760 hours CBG: No results found for this basename: GLUCAP,  in the last 168 hours  Recent Results (from the past 240 hour(s))  URINE CULTURE     Status: None   Collection Time    11/28/12 11:40 AM      Result Value Range Status   Specimen Description URINE, CATHETERIZED   Final   Special Requests NONE   Final   Culture  Setup Time 11/28/2012 18:32   Final   Colony Count 30,000 COLONIES/ML   Final   Culture ESCHERICHIA COLI   Final   Report Status PENDING   Incomplete  CULTURE, BLOOD (ROUTINE X 2)     Status: None   Collection Time    11/28/12  3:45 PM      Result Value Range Status   Specimen Description BLOOD LEFT ARM   Final   Special Requests BOTTLES DRAWN AEROBIC AND ANAEROBIC 5CC   Final   Culture  Setup Time 11/28/2012 20:36   Final   Culture     Final   Value:        BLOOD CULTURE RECEIVED NO GROWTH TO DATE CULTURE WILL BE HELD FOR 5 DAYS BEFORE ISSUING A FINAL NEGATIVE REPORT   Report Status PENDING   Incomplete  CULTURE, BLOOD (ROUTINE X 2)     Status: None   Collection Time    11/28/12  3:51 PM      Result Value Range Status   Specimen Description BLOOD LEFT HAND   Final   Special Requests BOTTLES DRAWN AEROBIC AND ANAEROBIC 5CC   Final   Culture  Setup Time 11/28/2012 20:37   Final   Culture     Final   Value:        BLOOD CULTURE RECEIVED NO GROWTH TO DATE CULTURE WILL BE HELD FOR 5 DAYS BEFORE ISSUING A FINAL NEGATIVE REPORT   Report Status PENDING   Incomplete  CLOSTRIDIUM DIFFICILE BY PCR     Status: None   Collection Time    11/28/12 10:56 PM      Result Value Range Status   C difficile by pcr NEGATIVE  NEGATIVE Final     Studies: No results found.  Scheduled Meds: . acyclovir  400 mg Oral BID  . ciprofloxacin  400 mg Intravenous  Q24H  . DULoxetine  30 mg Oral q morning - 10a  . feeding supplement  237 mL Oral BID BM  . fesoterodine  8 mg Oral Daily  . fluconazole  150 mg Oral Once  . levothyroxine  50 mcg Oral QAC breakfast  . metronidazole  500 mg Intravenous Q8H  . psyllium  1 packet Oral Daily  . sodium chloride  3 mL Intravenous Q12H  . temazepam  30 mg Oral QHS  . [START ON 12/04/2012] Vitamin D (Ergocalciferol)  50,000 Units Oral Q Mon   Continuous Infusions: . sodium chloride 500 mL/hr at 11/28/12 1130  . dextrose 5 % and 0.45 % NaCl with KCl 20 mEq/L 75 mL/hr at 11/30/12 1210    Principal Problem:   Nausea vomiting and diarrhea Active Problems:   Myeloma   Anemia in chronic renal disease   Renal insufficiency   Hypothyroidism   GERD (gastroesophageal reflux disease)   Asthma    Time spent: 30 min    Mykelle Cockerell, [facility]  [facility] Pager [phone]. If 7PM-7AM, please contact night-coverage at [email or web address], password [facility] 11/30/2012, 5:21 PM  LOS: 2 days

## 2012-11-30 NOTE — Progress Notes (Signed)
OT Cancellation Note  Patient Details Name: [name] MRN: [id number] DOB: [birth year]   Cancelled Treatment:    Reason Eval/Treat Not Completed: Other (comment) (pt just back to bed and fatiqued).  Will reattempt tomorrow  [name] 11/30/2012, 2:51 PM Marica Otter, OTR/L [phone] 11/30/2012

## 2012-12-01 DIAGNOSIS — N390 Urinary tract infection, site not specified: Secondary | ICD-10-CM

## 2012-12-01 DIAGNOSIS — A498 Other bacterial infections of unspecified site: Secondary | ICD-10-CM

## 2012-12-01 DIAGNOSIS — R110 Nausea: Secondary | ICD-10-CM

## 2012-12-01 LAB — CBC
Hemoglobin: 9.8 g/dL — ABNORMAL LOW (ref 12.0–15.0)
MCH: 33.8 pg (ref 26.0–34.0)
Platelets: 57 10*3/uL — ABNORMAL LOW (ref 150–400)
RBC: 2.9 MIL/uL — ABNORMAL LOW (ref 3.87–5.11)
WBC: 2.3 10*3/uL — ABNORMAL LOW (ref 4.0–10.5)

## 2012-12-01 LAB — BASIC METABOLIC PANEL
CO2: 20 mEq/L (ref 19–32)
Calcium: 7.3 mg/dL — ABNORMAL LOW (ref 8.4–10.5)
Potassium: 3.7 mEq/L (ref 3.5–5.1)
Sodium: 139 mEq/L (ref 135–145)

## 2012-12-01 LAB — URINE CULTURE: Colony Count: 30000

## 2012-12-01 LAB — MAGNESIUM: Magnesium: 1.9 mg/dL (ref 1.5–2.5)

## 2012-12-01 MED ORDER — LORAZEPAM 0.5 MG PO TABS: 0.5000 mg | ORAL_TABLET | Freq: Three times a day (TID) | ORAL | Status: DC | PRN

## 2012-12-01 MED ORDER — PANTOPRAZOLE SODIUM 40 MG PO TBEC
40.0000 mg | DELAYED_RELEASE_TABLET | Freq: Two times a day (BID) | ORAL | Status: DC
  Administered 2012-12-01 – 2012-12-04 (×6): 40 mg via ORAL
  Filled 2012-12-01 (×9): qty 1

## 2012-12-01 MED ORDER — CIPROFLOXACIN IN D5W 400 MG/200ML IV SOLN
400.0000 mg | Freq: Two times a day (BID) | INTRAVENOUS | Status: DC
  Administered 2012-12-01 – 2012-12-03 (×4): 400 mg via INTRAVENOUS
  Filled 2012-12-01 (×5): qty 200

## 2012-12-01 MED ORDER — ONDANSETRON HCL 4 MG/2ML IJ SOLN
4.0000 mg | Freq: Four times a day (QID) | INTRAMUSCULAR | Status: DC
  Administered 2012-12-01 – 2012-12-04 (×11): 4 mg via INTRAVENOUS
  Filled 2012-12-01 (×12): qty 2

## 2012-12-01 MED ORDER — SODIUM CHLORIDE 0.9 % IJ SOLN: 10.0000 mL | INTRAMUSCULAR | Status: DC | PRN

## 2012-12-01 MED ORDER — PROMETHAZINE HCL 25 MG/ML IJ SOLN
12.5000 mg | Freq: Four times a day (QID) | INTRAMUSCULAR | Status: DC | PRN
  Administered 2012-12-01: 12.5 mg via INTRAVENOUS
  Filled 2012-12-01: qty 1

## 2012-12-01 NOTE — Evaluation (Signed)
Occupational Therapy Evaluation Patient Details Name: [name] MRN: [id number] DOB: [birth year] Today's Date: 12/01/2012 Time: 7829-5621 OT Time Calculation (min): 19 min  OT Assessment / Plan / Recommendation Clinical Impression  This 67 year old female was admitted with N/V/D.  She has a h/o myeloma  adn COPD and functions at mod I level at home.  She is appropriate for skilled OT to increase activity tolerance with supervision level goals in acute.      OT Assessment  Patient needs continued OT Services    Follow Up Recommendations  Home health OT    Barriers to Discharge      Equipment Recommendations  None recommended by OT    Recommendations for Other Services    Frequency  Min 2X/week    Precautions / Restrictions Precautions Precautions: Fall Restrictions Weight Bearing Restrictions: No   Pertinent Vitals/Pain No pain but had nausea    ADL  Grooming: Set up Where Assessed - Grooming: Unsupported sitting Upper Body Bathing: Set up Where Assessed - Upper Body Bathing: Unsupported sitting Lower Body Bathing: Minimal assistance Where Assessed - Lower Body Bathing: Supported sit to stand Upper Body Dressing: Set up Where Assessed - Upper Body Dressing: Unsupported sitting Lower Body Dressing: Moderate assistance Where Assessed - Lower Body Dressing: Supported sit to stand Transfers/Ambulation Related to ADLs: Pt has been using 3:1.  Did not need to use bathroom at time of eval.  Pt is nauseaus but able to participate in OT eval:  had meds for this.   ADL Comments: Pt has AE but hasn't needed to use it recently.  Reviewed with her. Legs are tight.  Part of inability to reach feet may be because of bed height even in lowest position.  Reviewed energy conservation.  Pt has had myeloma for a long time and tends to balance out activity.  discussed sitting up a couple of times a day for shorter periods if needed.  She will try to sit up later when nausea passes    OT  Diagnosis: Generalized weakness  OT Problem List: Decreased strength;Decreased activity tolerance;Impaired balance (sitting and/or standing);Decreased knowledge of use of DME or AE OT Treatment Interventions: Self-care/ADL training;Energy conservation;DME and/or AE instruction;Patient/family education;Balance training   OT Goals Acute Rehab OT Goals OT Goal Formulation: With patient Time For Goal Achievement: 12/15/12 Potential to Achieve Goals: Good ADL Goals Pt Will Transfer to Toilet: with supervision;Ambulation;3-in-1 ADL Goal: Toilet Transfer - Progress: Goal set today Pt Will Perform Tub/Shower Transfer: Tub transfer;Shower seat with back;Ambulation;with min assist (min guard) ADL Goal: Tub/Shower Transfer - Progress: Goal set today Miscellaneous OT Goals Miscellaneous OT Goal #1: pt will gather clothes at supervision level with RW and complete adl with supervision OT Goal: Miscellaneous Goal #1 - Progress: Goal set today  Visit Information  Last OT Received On: 12/01/12 Assistance Needed: +1    Subjective Data  Subjective: I just had something for nausea Patient Stated Goal: get energy back   Prior Functioning     Home Living Lives With: Alone Available Help at Discharge: Family Type of Home: House Home Access: Level entry Home Layout: One level Bathroom Shower/Tub: Engineer, manufacturing systems: Standard Home Adaptive Equipment: Environmental consultant - four wheeled;Wheelchair - powered;Shower chair with back Additional Comments: pt is 4'11".  Has sink next to commode Prior Function Level of Independence: Independent with assistive device(s) Communication Communication: No difficulties         Vision/Perception     Cognition  Cognition Overall Cognitive Status: Appears  within functional limits for tasks assessed/performed Arousal/Alertness: Awake/alert Orientation Level: Appears intact for tasks assessed Behavior During Session: [facility] for tasks performed     Extremity/Trunk Assessment Right Upper Extremity Assessment RUE ROM/Strength/Tone: Within functional levels Left Upper Extremity Assessment LUE ROM/Strength/Tone: Within functional levels     Mobility Bed Mobility Bed Mobility: Supine to Sit;Sit to Supine Supine to Sit: 5: Supervision;With rails Sit to Supine: 5: Supervision;With rail Transfers Sit to Stand: 4: Min guard;From bed     Exercise     Balance     End of Session OT - End of Session Activity Tolerance: Other (comment) (worked within tolerance of feeling nauseaus) Patient left: in bed;with call bell/phone within reach  GO     [facility] 12/01/2012, 10:47 AM Marica Otter, OTR/L [phone] 12/01/2012

## 2012-12-01 NOTE — Progress Notes (Addendum)
[name] is doing better. She has a Escherichia coli urinary infection. She's on IV antibiotics for this.  She is getting some physical therapy while in the hospital. I think this will help her with strength and allow her to get home soon her. This is a great idea!!!  She has little nausea.  She tolerated her iron well yesterday.  Her blood counts seem to be stabilizing. Her white cell count 2.3. Platelet count was 57.  There is no cough or shortness of breath.  On her physical exam temperature 98.8 pulse 84 respiratory 18 blood pressure 115/55. Her lungs are clear bilaterally. Cardiac exam regular rate and rhythm with a normal S1-S2. Abdominal exam soft. His extremities shows no edema. Neurological exam is intact.  I suspect that [name] should be ready for discharge next week. I do think physical therapy would be helpful.  We probably will hold on her chemotherapy for a week or so so that we can make sure that she recovers fully from this infection.  Lawerance Sabal 1:12

## 2012-12-01 NOTE — Progress Notes (Signed)
ANTICOAGULATION CONSULT NOTE - Follow Up Consult  Pharmacy Consult for Cipro and Flagyl Indication: R/O Intra-abdominal Infection  Allergies  Allergen Reactions  . Trazodone And Nefazodone Other (See Comments)    nightmares  . Hydromorphone Itching  . Tramadol Itching  . Morphine And Related Rash  . Penicillins Rash    Patient Measurements: Height: 4\' 11"  (149.9 cm) Weight: 123 lb 8 oz (56.019 kg) IBW/kg (Calculated) : 43.2  Vital Signs: Temp: 98.4 F (36.9 C) (02/14 1447) Temp src: Oral (02/14 1447) BP: 121/79 mmHg (02/14 1447) Pulse Rate: 99 (02/14 1447)  Labs:  Recent Labs  11/29/12 0515 11/29/12 1040 11/29/12 2000 11/30/12 0815 12/01/12 0443  HGB 6.5* 6.4* 9.0* 9.6* 9.8*  HCT 20.3* 20.3* 27.3* 28.9* 29.4*  PLT 59* 57*  59*  --  54* 57*  APTT  --  41*  --   --   --   LABPROT  --  14.4  --   --   --   INR  --  1.14  --   --   --   CREATININE 1.70*  --   --  1.51* 1.31*    Estimated Creatinine Clearance: 32.6 ml/min (by C-G formula based on Cr of 1.31).   Medications:  Anti-infectives   Start     Dose/Rate Route Frequency Ordered Stop   11/30/12 1800  fluconazole (DIFLUCAN) tablet 150 mg     150 mg Oral  Once 11/30/12 1719 11/30/12 1922   11/29/12 0200  metroNIDAZOLE (FLAGYL) IVPB 500 mg     500 mg 100 mL/hr over 60 Minutes Intravenous Every 8 hours 11/28/12 1607     11/28/12 2200  acyclovir (ZOVIRAX) tablet 400 mg     400 mg Oral 2 times daily 11/28/12 1700     11/28/12 1700  metroNIDAZOLE (FLAGYL) IVPB 500 mg     500 mg 100 mL/hr over 60 Minutes Intravenous  Once 11/28/12 1606 11/28/12 2228   11/28/12 1615  ciprofloxacin (CIPRO) IVPB 400 mg     400 mg 200 mL/hr over 60 Minutes Intravenous Every 24 hours 11/28/12 1606        Assessment: 67 yo F on Day #4 Cipro and Flagyl for r/o intra-abdominal infection. SCr has improved since admit, CrCl now ~ 61ml/min, will renally adjust cipro. Flagyl does not require renal adjustment. Awaiting results of  stool culture.   Plan:  1) Increase Cipro to 400mg  IV q12h 2) Pharmacy will sign off Flagyl monitoring (no dose adjustment needed).  Darrol Angel, PharmD Pager: [phone] 12/01/2012,2:57 PM

## 2012-12-01 NOTE — Care Management (Addendum)
CARE MANAGEMENT NOTE 12/01/2012  Patient:  [name], [name]   Account Number:  [id number]  Date Initiated:  12/01/2012  Documentation initiated by:  Loyola Santino  Subjective/Objective Assessment:   67 yo female admitted with n/v & diarrhea. Pt states living alone PTA.     Action/Plan:   Home with HH   Anticipated DC Date:  12/04/2012   Anticipated DC Plan:  HOME W HOME HEALTH SERVICES      DC Planning Services  CM consult      Choice offered to / List presented to:  C-1 Patient   DME arranged  NA      DME agency  NA     HH arranged  HH-2 PT      HH agency  [facility]   Status of service:  Completed, signed off Medicare Important Message given?   (If response is "NO", the following Medicare IM given date fields will be blank) Date Medicare IM given:   Date Additional Medicare IM given:    Discharge Disposition:  HOME W HOME HEALTH SERVICES  Per UR Regulation:    If discussed at Long Length of Stay Meetings, dates discussed:    Comments:  12/01/12 1144 Hammad Finkler,RN,BSN [phone] Cm spoke with patient concerning discharge planning. Per pt choice [facility] to provide [facility] services upon discharge. [facility] rep Mikiya Nebergall notified of new referral. Pt states having RW & BSC for home use. No other HH services or DME needs stated.

## 2012-12-01 NOTE — Progress Notes (Signed)
[facility] PROGRESS NOTE  [name] GNF:[id number] DOB: [birth year] DOA: 11/28/2012 PCP: Estill Cotta, MD  Assessment/Plan  Nausea/vomiting/diarrhea, nausea and vomiting persisting with minimal oral intake.  Diarrhea improving with imodium but now has refractory nausea and vomiting again -  Schedule zofran -  Start phenergan for refractory symptoms.   -  C. Diff negative -  Stool culture pending - suspect viral gastroenteritis  - blood cultures NGTD - Continue cipro and flagyl pending results of stool culture - Continue MIVFs   2. Hypothyroidism, TSH 2.49 - Continue home regimen   Elevated D-dimer:  Likely related to acute infection in the setting of malignancy.  Was obtained in the setting of evaluating for DIC, which the patient clearly does not have.  She has no symptoms of PE and is not tachycardic.  Will monitor clinically.   3. Myeloma with developing pancytopenia, likely due to viral suppression - Appreciate heme/onc assistance  4. Anemia, improved yesterday after blood transfusion of 2 units and stable - anemia panel with low iron level, however, ferritin is elevated and saturation is normal.  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 Asthma  - continue albuterol  - At baseline   Vaginal discomfort, likely yeast infection in the setting of antibiotics -  Wet prep -  Fluconazole 150mg  po once  Polypharmacy:  Spoke at length with patient regarding simplifying her medication regimen.  She declines allowing me to discuss changing her regimen except for transitioning her from xanax to ativan and taking xanax off of her home medication list.  She agrees that she has not used her muscle relaxants in several weeks to years and will talk about them with Dr. Gustavo Lah nurse.  She likes to have the record of her medications in the system, she says, however, I advised her that the duplicates  of similar medications can make medication errors more likely.    Diet:  Clear liquid, advance as tolerated to regular Access:  PIV IVF:  NS at 62ml/h Proph:  SCDs   Code Status: full code Family Communication: spoke with patient alone Disposition Plan: pending improvement in diarrhea, still incontinent of stool and having frequent BMs (not all recorded on I/O) and poor oral intake.  Home health PT/OT.     Consultants:  Hematology  Procedures:  none  Antibiotics:  Cipro 2/11 >>  Flagyl 2/11 >>   HPI/Subjective: Patient states that her vomiting has returned terribly today and she has continuously thrown up this afternoon.   Diarrhea improved with imodium.  Denies CP, SOB, fevers, chills.    Objective: Filed Vitals:   11/30/12 1400 11/30/12 2036 12/01/12 0503 12/01/12 1447  BP: 109/67 127/62 115/55 121/79  Pulse: 97 86 84 99  Temp: 98.7 F (37.1 C) 98 F (36.7 C) 98.8 F (37.1 C) 98.4 F (36.9 C)  TempSrc: Oral Oral Oral Oral  Resp: 20 18 18 18   Height:      Weight:      SpO2: 98% 100% 97% 97%    Intake/Output Summary (Last 24 hours) at 12/01/12 1457 Last data filed at 12/01/12 1453  Gross per 24 hour  Intake 2100.25 ml  Output   3050 ml  Net -949.75 ml   Filed Weights   11/28/12 1540 11/28/12 1700  Weight: 55.8 kg (123 lb 0.3 oz) 56.019 kg (123 lb 8 oz)    Exam:   General:  Caucasian female,  no acute distress, lying in bed  HEENT:  MMM  Cardiovascular:  RRR, no mrg, 2+ pulses, warm extremities  Respiratory:  CTAB  Abdomen:  NABS, mildly distended, nontender.     MSK:  Normal tone and bulk, 1+ LEE  Neuro:  Grossly intact  Data Reviewed: Basic Metabolic Panel:  Recent Labs Lab 11/28/12 0945 11/28/12 1731 11/29/12 0515 11/30/12 0815 12/01/12 0443  NA 139  --  137 137 139  K 4.0  --  3.1* 2.9* 3.7  CL 105  --  107 106 109  CO2 22  --  20 19 20   GLUCOSE 127*  --  87 103* 96  BUN 26*  --  19 11 8   CREATININE 1.70*  --  1.70* 1.51*  1.31*  CALCIUM 8.4  --  6.9* 6.8* 7.3*  MG  --  1.5  --  1.3* 1.9  PHOS  --  2.8  --   --   --    Liver Function Tests: No results found for this basename: AST, ALT, ALKPHOS, BILITOT, PROT, ALBUMIN,  in the last 168 hours No results found for this basename: LIPASE, AMYLASE,  in the last 168 hours No results found for this basename: AMMONIA,  in the last 168 hours CBC:  Recent Labs Lab 11/28/12 0945 11/29/12 0515 11/29/12 1040 11/29/12 2000 11/30/12 0815 12/01/12 0443  WBC 3.5* 1.6* 1.3*  --  2.1* 2.3*  NEUTROABS 3.2  --  1.1*  --   --   --   HGB 8.1* 6.5* 6.4* 9.0* 9.6* 9.8*  HCT 24.8* 20.3* 20.3* 27.3* 28.9* 29.4*  MCV 108.3* 110.3* 110.9*  --  101.0* 101.4*  PLT 81* 59* 57*  59*  --  54* 57*   Cardiac Enzymes: No results found for this basename: CKTOTAL, CKMB, CKMBINDEX, TROPONINI,  in the last 168 hours BNP (last 3 results) No results found for this basename: PROBNP,  in the last 8760 hours CBG: No results found for this basename: GLUCAP,  in the last 168 hours  Recent Results (from the past 240 hour(s))  URINE CULTURE     Status: None   Collection Time    11/28/12 11:40 AM      Result Value Range Status   Specimen Description URINE, CATHETERIZED   Final   Special Requests NONE   Final   Culture  Setup Time 11/28/2012 18:32   Final   Colony Count 30,000 COLONIES/ML   Final   Culture ESCHERICHIA COLI   Final   Report Status PENDING   Incomplete  CULTURE, BLOOD (ROUTINE X 2)     Status: None   Collection Time    11/28/12  3:45 PM      Result Value Range Status   Specimen Description BLOOD LEFT ARM   Final   Special Requests BOTTLES DRAWN AEROBIC AND ANAEROBIC 5CC   Final   Culture  Setup Time 11/28/2012 20:36   Final   Culture     Final   Value:        BLOOD CULTURE RECEIVED NO GROWTH TO DATE CULTURE WILL BE HELD FOR 5 DAYS BEFORE ISSUING A FINAL NEGATIVE REPORT   Report Status PENDING   Incomplete  CULTURE, BLOOD (ROUTINE X 2)     Status: None   Collection  Time    11/28/12  3:51 PM      Result Value Range Status   Specimen Description BLOOD LEFT HAND   Final   Special Requests BOTTLES DRAWN AEROBIC  AND ANAEROBIC 5CC   Final   Culture  Setup Time 11/28/2012 20:37   Final   Culture     Final   Value:        BLOOD CULTURE RECEIVED NO GROWTH TO DATE CULTURE WILL BE HELD FOR 5 DAYS BEFORE ISSUING A FINAL NEGATIVE REPORT   Report Status PENDING   Incomplete  CLOSTRIDIUM DIFFICILE BY PCR     Status: None   Collection Time    11/28/12 10:56 PM      Result Value Range Status   C difficile by pcr NEGATIVE  NEGATIVE Final  STOOL CULTURE     Status: None   Collection Time    11/29/12  9:06 AM      Result Value Range Status   Specimen Description STOOL   Final   Special Requests Immunocompromised   Final   Culture Culture reincubated for better growth   Final   Report Status PENDING   Incomplete     Studies: No results found.  Scheduled Meds: . acyclovir  400 mg Oral BID  . ciprofloxacin  400 mg Intravenous Q24H  . DULoxetine  30 mg Oral q morning - 10a  . feeding supplement  237 mL Oral BID BM  . fesoterodine  8 mg Oral Daily  . levothyroxine  50 mcg Oral QAC breakfast  . metronidazole  500 mg Intravenous Q8H  . ondansetron (ZOFRAN) IV  4 mg Intravenous Q6H  . psyllium  1 packet Oral Daily  . sodium chloride  3 mL Intravenous Q12H  . temazepam  30 mg Oral QHS  . [START ON 12/04/2012] Vitamin D (Ergocalciferol)  50,000 Units Oral Q Mon   Continuous Infusions: . sodium chloride 500 mL/hr at 11/28/12 1130  . dextrose 5 % and 0.45 % NaCl with KCl 20 mEq/L 75 mL/hr at 12/01/12 0605    Principal Problem:   Nausea vomiting and diarrhea Active Problems:   Myeloma   Anemia in chronic renal disease   Renal insufficiency   Hypothyroidism   GERD (gastroesophageal reflux disease)   Asthma    Time spent: 30 min    [name], [facility]  [facility] Pager [phone]. If 7PM-7AM, please contact night-coverage at [email or web address],  password [employer] 12/01/2012, 2:57 PM  LOS: 3 days

## 2012-12-01 NOTE — Progress Notes (Signed)
CSW attempted to speak with patient re: Advance Directives. Patient states that she has been feeling nauseated and just took medication to help - requested that CSW will come back tomorrow.   Unice Bailey, LCSW [facility] Clinical Social Worker cell #: [phone]

## 2012-12-02 LAB — BASIC METABOLIC PANEL
BUN: 7 mg/dL (ref 6–23)
CO2: 21 mEq/L (ref 19–32)
Chloride: 110 mEq/L (ref 96–112)
GFR calc Af Amer: 50 mL/min — ABNORMAL LOW (ref >90)
Potassium: 3.4 mEq/L — ABNORMAL LOW (ref 3.5–5.1)

## 2012-12-02 LAB — CBC
HCT: 30.2 % — ABNORMAL LOW (ref 36.0–46.0)
Platelets: 59 10*3/uL — ABNORMAL LOW (ref 150–400)
RBC: 2.94 MIL/uL — ABNORMAL LOW (ref 3.87–5.11)
RDW: 22.3 % — ABNORMAL HIGH (ref 11.5–15.5)
WBC: 2.3 10*3/uL — ABNORMAL LOW (ref 4.0–10.5)

## 2012-12-02 MED ORDER — POTASSIUM CHLORIDE 10 MEQ/100ML IV SOLN
10.0000 meq | INTRAVENOUS | Status: AC
  Administered 2012-12-02 (×2): 10 meq via INTRAVENOUS
  Filled 2012-12-02 (×2): qty 100

## 2012-12-02 MED ORDER — SODIUM CHLORIDE 0.9 % IV BOLUS (SEPSIS)
1000.0000 mL | Freq: Once | INTRAVENOUS | Status: AC
  Administered 2012-12-02: 1000 mL via INTRAVENOUS

## 2012-12-02 NOTE — Plan of Care (Signed)
Problem: Discharge Progression Outcomes Goal: Activity appropriate for discharge plan Outcome: Progressing To chair, back to bed, stand-by

## 2012-12-02 NOTE — Progress Notes (Signed)
[facility] PROGRESS NOTE  [name] MVH:[id number] DOB: [birth year] DOA: 11/28/2012 PCP: Stacie Cotta, MD  Assessment/Plan  Nausea/vomiting/diarrhea, nausea and vomiting persisting with minimal oral intake.  Diarrhea improving with imodium but now has refractory nausea and vomiting again -  Continue scheduled zofran -  Continue phenergan for refractory symptoms.   -  C. Diff negative -  Stool culture NGTD - suspect viral gastroenteritis  - blood cultures NGTD - Continue cipro and flagyl pending results of stool culture - Continue MIVFs   Hypotension. May be hypovolemia.  Patient is already on antibiotics for possible diverticulitis.  No fever or other signs of sepsis. - NS bolus - Increase MIVF  2. Hypothyroidism, TSH 2.49 - Continue home regimen   Elevated D-dimer:  Likely related to acute infection in the setting of malignancy.  Was obtained in the setting of evaluating for DIC, which the patient clearly does not have.  She has no symptoms of PE and is not tachycardic.  Will monitor clinically.   3. Myeloma with developing pancytopenia, likely due to viral suppression - Appreciate heme/onc assistance  4. Anemia, improved after blood transfusion of 2 units and stable - anemia panel with low iron level, however, ferritin is elevated and saturation is normal.  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 Asthma  - continue albuterol  - At baseline   Vaginal discomfort, likely yeast infection in the setting of antibiotics -  Wet prep -  Fluconazole 150mg  po once  Polypharmacy:  Spoke at length with patient regarding simplifying her medication regimen.  She declines allowing me to discuss changing her regimen except for transitioning her from xanax to ativan and taking xanax off of her home medication list.  She agrees that she has not used her muscle relaxants in several weeks to years  and will talk about them with Dr. Gustavo Rios nurse.  She likes to have the record of her medications in the system, she says, however, I advised her that the duplicates of similar medications can make medication errors more likely.    Diet:  regular Access:  PIV IVF:  NS at 75 ml/h Proph:  SCDs   Code Status: full code Family Communication: spoke with patient and her children Disposition Plan: pending improvement in blood pressure and poor oral intake.  Home health PT/OT.     Consultants:  Hematology  Procedures:  none  Antibiotics:  Cipro 2/11 >>  Flagyl 2/11 >>   HPI/Subjective: Patient states that her vomiting has improved and she was able to tolerate some breakfast.   Diarrhea improved with imodium.  Denies CP, SOB, fevers, chills.    Objective: Filed Vitals:   12/01/12 1447 12/01/12 2122 12/02/12 0456 12/02/12 1408  BP: 121/79 125/53 101/54 82/61  Pulse: 99 83 80 67  Temp: 98.4 F (36.9 C) 98.7 F (37.1 C) 98.8 F (37.1 C) 98.8 F (37.1 C)  TempSrc: Oral Oral Oral Oral  Resp: 18 18 18 16   Height:      Weight:      SpO2: 97% 98% 98% 99%    Intake/Output Summary (Last 24 hours) at 12/02/12 1824 Last data filed at 12/02/12 1544  Gross per 24 hour  Intake 2708.75 ml  Output   2401 ml  Net 307.75 ml   Filed Weights   11/28/12 1540 11/28/12 1700  Weight: 55.8 kg (123 lb 0.3 oz) 56.019 kg (123 lb 8  oz)    Exam:   General:  Caucasian female, no acute distress, sitting up in bed  HEENT:  MMM  Cardiovascular:  RRR, no mrg, 2+ pulses, warm extremities  Respiratory:  CTAB  Abdomen:  Hyperactive bowel sounds, mildly distended, nontender.     MSK:  Normal tone and bulk, trace LEE  Neuro:  Grossly intact  Data Reviewed: Basic Metabolic Panel:  Recent Labs Lab 11/28/12 0945 11/28/12 1731 11/29/12 0515 11/30/12 0815 12/01/12 0443 12/02/12 0425  NA 139  --  137 137 139 139  K 4.0  --  3.1* 2.9* 3.7 3.4*  CL 105  --  107 106 109 110  CO2 22   --  20 19 20 21   GLUCOSE 127*  --  87 103* 96 129*  BUN 26*  --  19 11 8 7   CREATININE 1.70*  --  1.70* 1.51* 1.31* 1.28*  CALCIUM 8.4  --  6.9* 6.8* 7.3* 7.4*  MG  --  1.5  --  1.3* 1.9  --   PHOS  --  2.8  --   --   --   --    Liver Function Tests: No results found for this basename: AST, ALT, ALKPHOS, BILITOT, PROT, ALBUMIN,  in the last 168 hours No results found for this basename: LIPASE, AMYLASE,  in the last 168 hours No results found for this basename: AMMONIA,  in the last 168 hours CBC:  Recent Labs Lab 11/28/12 0945 11/29/12 0515 11/29/12 1040 11/29/12 2000 11/30/12 0815 12/01/12 0443 12/02/12 0425  WBC 3.5* 1.6* 1.3*  --  2.1* 2.3* 2.3*  NEUTROABS 3.2  --  1.1*  --   --   --   --   HGB 8.1* 6.5* 6.4* 9.0* 9.6* 9.8* 10.0*  HCT 24.8* 20.3* 20.3* 27.3* 28.9* 29.4* 30.2*  MCV 108.3* 110.3* 110.9*  --  101.0* 101.4* 102.7*  PLT 81* 59* 57*  59*  --  54* 57* 59*   Cardiac Enzymes: No results found for this basename: CKTOTAL, CKMB, CKMBINDEX, TROPONINI,  in the last 168 hours BNP (last 3 results) No results found for this basename: PROBNP,  in the last 8760 hours CBG: No results found for this basename: GLUCAP,  in the last 168 hours  Recent Results (from the past 240 hour(s))  URINE CULTURE     Status: None   Collection Time    11/28/12 11:40 AM      Result Value Range Status   Specimen Description URINE, CATHETERIZED   Final   Special Requests NONE   Final   Culture  Setup Time 11/28/2012 18:32   Final   Colony Count 30,000 COLONIES/ML   Final   Culture ESCHERICHIA COLI   Final   Report Status 12/01/2012 FINAL   Final   Organism ID, Bacteria ESCHERICHIA COLI   Final  CULTURE, BLOOD (ROUTINE X 2)     Status: None   Collection Time    11/28/12  3:45 PM      Result Value Range Status   Specimen Description BLOOD LEFT ARM   Final   Special Requests BOTTLES DRAWN AEROBIC AND ANAEROBIC 5CC   Final   Culture  Setup Time 11/28/2012 20:36   Final   Culture      Final   Value:        BLOOD CULTURE RECEIVED NO GROWTH TO DATE CULTURE WILL BE HELD FOR 5 DAYS BEFORE ISSUING A FINAL NEGATIVE REPORT   Report Status PENDING  Incomplete  CULTURE, BLOOD (ROUTINE X 2)     Status: None   Collection Time    11/28/12  3:51 PM      Result Value Range Status   Specimen Description BLOOD LEFT HAND   Final   Special Requests BOTTLES DRAWN AEROBIC AND ANAEROBIC 5CC   Final   Culture  Setup Time 11/28/2012 20:37   Final   Culture     Final   Value:        BLOOD CULTURE RECEIVED NO GROWTH TO DATE CULTURE WILL BE HELD FOR 5 DAYS BEFORE ISSUING A FINAL NEGATIVE REPORT   Report Status PENDING   Incomplete  CLOSTRIDIUM DIFFICILE BY PCR     Status: None   Collection Time    11/28/12 10:56 PM      Result Value Range Status   C difficile by pcr NEGATIVE  NEGATIVE Final  STOOL CULTURE     Status: None   Collection Time    11/29/12  9:06 AM      Result Value Range Status   Specimen Description STOOL   Final   Special Requests Immunocompromised   Final   Culture NO SUSPICIOUS COLONIES, CONTINUING TO HOLD   Final   Report Status PENDING   Incomplete     Studies: No results found.  Scheduled Meds: . acyclovir  400 mg Oral BID  . ciprofloxacin  400 mg Intravenous Q12H  . DULoxetine  30 mg Oral q morning - 10a  . feeding supplement  237 mL Oral BID BM  . fesoterodine  8 mg Oral Daily  . levothyroxine  50 mcg Oral QAC breakfast  . metronidazole  500 mg Intravenous Q8H  . ondansetron (ZOFRAN) IV  4 mg Intravenous Q6H  . pantoprazole  40 mg Oral BID AC  . psyllium  1 packet Oral Daily  . sodium chloride  1,000 mL Intravenous Once  . sodium chloride  3 mL Intravenous Q12H  . temazepam  30 mg Oral QHS  . [START ON 12/04/2012] Vitamin D (Ergocalciferol)  50,000 Units Oral Q Mon   Continuous Infusions: . sodium chloride 500 mL/hr at 11/28/12 1130  . dextrose 5 % and 0.45 % NaCl with KCl 20 mEq/L 75 mL/hr at 12/02/12 1300    Principal Problem:   Nausea vomiting  and diarrhea Active Problems:   Myeloma   Anemia in chronic renal disease   Renal insufficiency   Hypothyroidism   GERD (gastroesophageal reflux disease)   Asthma    Time spent: 30 min    Yahia Bottger, [facility]  [facility] Pager [phone]. If 7PM-7AM, please contact night-coverage at [email or web address], password [facility] 12/02/2012, 6:24 PM  LOS: 4 days

## 2012-12-03 DIAGNOSIS — N189 Chronic kidney disease, unspecified: Secondary | ICD-10-CM

## 2012-12-03 DIAGNOSIS — D631 Anemia in chronic kidney disease: Secondary | ICD-10-CM

## 2012-12-03 LAB — BASIC METABOLIC PANEL
BUN: 6 mg/dL (ref 6–23)
CO2: 20 mEq/L (ref 19–32)
Chloride: 112 mEq/L (ref 96–112)
Creatinine, Ser: 1.22 mg/dL — ABNORMAL HIGH (ref 0.50–1.10)
Glucose, Bld: 98 mg/dL (ref 70–99)

## 2012-12-03 LAB — CBC
HCT: 29.7 % — ABNORMAL LOW (ref 36.0–46.0)
MCH: 34 pg (ref 26.0–34.0)
MCV: 103.1 fL — ABNORMAL HIGH (ref 78.0–100.0)
RBC: 2.88 MIL/uL — ABNORMAL LOW (ref 3.87–5.11)
WBC: 2.3 10*3/uL — ABNORMAL LOW (ref 4.0–10.5)

## 2012-12-03 LAB — STOOL CULTURE

## 2012-12-03 NOTE — Progress Notes (Signed)
[facility] PROGRESS NOTE  [name] ZHY:[id number] DOB: 1946-09-26 DOA: 11/28/2012 PCP: Estill Cotta, MD  Assessment/Plan  Nausea/vomiting/diarrhea, nausea and vomiting persisting with minimal oral intake, possibly viral gastroenteritis.  Diarrhea resolved with imodium. -  Continue scheduled zofran -  Continue phenergan for refractory symptoms.   -  C. Diff negative -  Stool culture NGF -  blood cultures NGTD - d/c cipro and flagyl - d/c IVFs to encourage more eating and drinking.  Monitor closely for poor PO intake and restart fluids prn.    E. Coli UTI:  Completed 5 days of IV antibiotics.    Hypotension.  Resolved with IVF.    Hypothyroidism, TSH 2.49 - Continue home regimen   Elevated D-dimer:  Likely related to acute infection in the setting of malignancy.  Was obtained in the setting of evaluating for DIC, which the patient clearly does not have.  She has no symptoms of PE and is not tachycardic.  Will monitor clinically.   Myeloma with developing pancytopenia, likely due to viral suppression - Appreciate heme/onc assistance  Anemia, improved after blood transfusion of 2 units and stable - anemia panel with low iron level, however, ferritin is elevated and saturation is normal.  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 -  Wet prep -  Fluconazole 150mg  po once  Polypharmacy:  Spoke at length with patient regarding simplifying her medication regimen.  She declines allowing me to discuss changing her regimen except for transitioning her from xanax to ativan and taking xanax off of her home medication list.  She agrees that she has not used her muscle relaxants in several weeks to years and will talk about them with Dr. Gustavo Lah nurse.  She likes to have the  record of her medications in the system, she says, however, I advised her that the duplicates of similar medications can make medication errors more likely.    Diet:  regular Access:  PIV IVF:  OFF Proph:  SCDs   Code Status: full code Family Communication: spoke with patient alone Disposition Plan:  Possibly tomorrow if able to eat and drink enough to maintain hydration today.  Home health PT/OT.     Consultants:  Hematology  Procedures:  none  Antibiotics:  Cipro 2/11 >> 2/16  Flagyl 2/11 >> 2/16  HPI/Subjective: Patient states that her vomiting has improved but she was only able to drink some orange juice this AM.  Will try to eat some lunch.   Diarrhea resolved with imodium and had normal BM yesterday.  Denies CP, SOB, fevers, chills.    Objective: Filed Vitals:   12/02/12 1830 12/02/12 2140 12/03/12 0526 12/03/12 1028  BP: 133/59 116/79 109/52 110/54  Pulse: 94 84 77 78  Temp:  98.1 F (36.7 C) 98.5 F (36.9 C) 98.9 F (37.2 C)  TempSrc:  Oral Oral Oral  Resp:  16 16 17   Height:      Weight:      SpO2:  95% 96% 98%    Intake/Output Summary (Last 24 hours) at 12/03/12 1334 Last data filed at 12/03/12 1206  Gross per 24 hour  Intake   2000 ml  Output   4802 ml  Net  -2802 ml   Filed Weights   11/28/12 1540 11/28/12 1700  Weight: 55.8  kg (123 lb 0.3 oz) 56.019 kg (123 lb 8 oz)    Exam:   General:  Caucasian female, no acute distress, sitting up in chair  HEENT:  MMM  Cardiovascular:  RRR, no mrg, 2+ pulses, warm extremities  Respiratory:  CTAB  Abdomen:  NABS, mildly distended, nontender.     MSK:  Normal tone and bulk, trace LEE  Neuro:  Grossly intact  Data Reviewed: Basic Metabolic Panel:  Recent Labs Lab 11/28/12 0945 11/28/12 1731 11/29/12 0515 11/30/12 0815 12/01/12 0443 12/02/12 0425 12/03/12 0458  NA 139  --  137 137 139 139 140  K 4.0  --  3.1* 2.9* 3.7 3.4* 3.6  CL 105  --  107 106 109 110 112  CO2 22  --  20 19 20  21 20   GLUCOSE 127*  --  87 103* 96 129* 98  BUN 26*  --  19 11 8 7 6   CREATININE 1.70*  --  1.70* 1.51* 1.31* 1.28* 1.22*  CALCIUM 8.4  --  6.9* 6.8* 7.3* 7.4* 7.8*  MG  --  1.5  --  1.3* 1.9  --   --   PHOS  --  2.8  --   --   --   --   --    Liver Function Tests: No results found for this basename: AST, ALT, ALKPHOS, BILITOT, PROT, ALBUMIN,  in the last 168 hours No results found for this basename: LIPASE, AMYLASE,  in the last 168 hours No results found for this basename: AMMONIA,  in the last 168 hours CBC:  Recent Labs Lab 11/28/12 0945  11/29/12 1040 11/29/12 2000 11/30/12 0815 12/01/12 0443 12/02/12 0425 12/03/12 0458  WBC 3.5*  < > 1.3*  --  2.1* 2.3* 2.3* 2.3*  NEUTROABS 3.2  --  1.1*  --   --   --   --   --   HGB 8.1*  < > 6.4* 9.0* 9.6* 9.8* 10.0* 9.8*  HCT 24.8*  < > 20.3* 27.3* 28.9* 29.4* 30.2* 29.7*  MCV 108.3*  < > 110.9*  --  101.0* 101.4* 102.7* 103.1*  PLT 81*  < > 57*  59*  --  54* 57* 59* 58*  < > = values in this interval not displayed. Cardiac Enzymes: No results found for this basename: CKTOTAL, CKMB, CKMBINDEX, TROPONINI,  in the last 168 hours BNP (last 3 results) No results found for this basename: PROBNP,  in the last 8760 hours CBG: No results found for this basename: GLUCAP,  in the last 168 hours  Recent Results (from the past 240 hour(s))  URINE CULTURE     Status: None   Collection Time    11/28/12 11:40 AM      Result Value Range Status   Specimen Description URINE, CATHETERIZED   Final   Special Requests NONE   Final   Culture  Setup Time 11/28/2012 18:32   Final   Colony Count 30,000 COLONIES/ML   Final   Culture ESCHERICHIA COLI   Final   Report Status 12/01/2012 FINAL   Final   Organism ID, Bacteria ESCHERICHIA COLI   Final  CULTURE, BLOOD (ROUTINE X 2)     Status: None   Collection Time    11/28/12  3:45 PM      Result Value Range Status   Specimen Description BLOOD LEFT ARM   Final   Special Requests BOTTLES DRAWN AEROBIC  AND ANAEROBIC 5CC   Final   Culture  Setup Time 11/28/2012 20:36   Final   Culture     Final   Value:        BLOOD CULTURE RECEIVED NO GROWTH TO DATE CULTURE WILL BE HELD FOR 5 DAYS BEFORE ISSUING A FINAL NEGATIVE REPORT   Report Status PENDING   Incomplete  CULTURE, BLOOD (ROUTINE X 2)     Status: None   Collection Time    11/28/12  3:51 PM      Result Value Range Status   Specimen Description BLOOD LEFT HAND   Final   Special Requests BOTTLES DRAWN AEROBIC AND ANAEROBIC 5CC   Final   Culture  Setup Time 11/28/2012 20:37   Final   Culture     Final   Value:        BLOOD CULTURE RECEIVED NO GROWTH TO DATE CULTURE WILL BE HELD FOR 5 DAYS BEFORE ISSUING A FINAL NEGATIVE REPORT   Report Status PENDING   Incomplete  CLOSTRIDIUM DIFFICILE BY PCR     Status: None   Collection Time    11/28/12 10:56 PM      Result Value Range Status   C difficile by pcr NEGATIVE  NEGATIVE Final  STOOL CULTURE     Status: None   Collection Time    11/29/12  9:06 AM      Result Value Range Status   Specimen Description STOOL   Final   Special Requests Immunocompromised   Final   Culture     Final   Value: NO SALMONELLA, SHIGELLA, CAMPYLOBACTER, YERSINIA, OR E.COLI 0157:[id number] ISOLATED   Report Status 12/03/2012 FINAL   Final     Studies: No results found.  Scheduled Meds: . acyclovir  400 mg Oral BID  . DULoxetine  30 mg Oral q morning - 10a  . feeding supplement  237 mL Oral BID BM  . fesoterodine  8 mg Oral Daily  . levothyroxine  50 mcg Oral QAC breakfast  . ondansetron (ZOFRAN) IV  4 mg Intravenous Q6H  . pantoprazole  40 mg Oral BID AC  . psyllium  1 packet Oral Daily  . sodium chloride  3 mL Intravenous Q12H  . temazepam  30 mg Oral QHS  . [START ON 12/04/2012] Vitamin D (Ergocalciferol)  50,000 Units Oral Q Mon   Continuous Infusions:    Principal Problem:   Nausea vomiting and diarrhea Active Problems:   Myeloma   Anemia in chronic renal disease   Renal insufficiency    Hypothyroidism   GERD (gastroesophageal reflux disease)   Asthma    Time spent: 30 min    [name]  [facility] Pager [phone]. If 7PM-7AM, please contact night-coverage at [email or web address], password [facility] 12/03/2012, 1:34 PM  LOS: 5 days

## 2012-12-03 NOTE — Plan of Care (Signed)
Problem: Discharge Progression Outcomes Goal: Tolerating diet Outcome: Progressing Eating small bites

## 2012-12-03 NOTE — Progress Notes (Signed)
Patient has two continuous fluids listed as active orders.  Please confirm.

## 2012-12-04 ENCOUNTER — Other Ambulatory Visit: Payer: Self-pay | Admitting: Hematology & Oncology

## 2012-12-04 LAB — CBC
HCT: 35.4 % — ABNORMAL LOW (ref 36.0–46.0)
MCH: 33.4 pg (ref 26.0–34.0)
MCHC: 32.5 g/dL (ref 30.0–36.0)
MCV: 102.9 fL — ABNORMAL HIGH (ref 78.0–100.0)
RDW: 20.8 % — ABNORMAL HIGH (ref 11.5–15.5)

## 2012-12-04 LAB — COMPREHENSIVE METABOLIC PANEL
Albumin: 3.7 g/dL (ref 3.5–5.2)
BUN: 12 mg/dL (ref 6–23)
Calcium: 9.6 mg/dL (ref 8.4–10.5)
Chloride: 105 mEq/L (ref 96–112)
Creatinine, Ser: 1.48 mg/dL — ABNORMAL HIGH (ref 0.50–1.10)
Total Bilirubin: 0.5 mg/dL (ref 0.3–1.2)
Total Protein: 6.2 g/dL (ref 6.0–8.3)

## 2012-12-04 LAB — CULTURE, BLOOD (ROUTINE X 2): Culture: NO GROWTH

## 2012-12-04 MED ORDER — PROMETHAZINE HCL 25 MG PO TABS: 12.5000 mg | ORAL_TABLET | Freq: Four times a day (QID) | ORAL | Status: DC | PRN

## 2012-12-04 MED ORDER — ALTEPLASE 2 MG IJ SOLR
2.0000 mg | Freq: Once | INTRAMUSCULAR | Status: AC
  Administered 2012-12-04: 2 mg
  Filled 2012-12-04: qty 2

## 2012-12-04 MED ORDER — LORAZEPAM 0.5 MG PO TABS: 0.5000 mg | ORAL_TABLET | Freq: Three times a day (TID) | ORAL | Status: DC | PRN

## 2012-12-04 MED ORDER — HEPARIN SOD (PORK) LOCK FLUSH 100 UNIT/ML IV SOLN
500.0000 [IU] | INTRAVENOUS | Status: AC | PRN
  Administered 2012-12-04: 500 [IU]

## 2012-12-04 MED ORDER — ONDANSETRON HCL 8 MG PO TABS: 8.0000 mg | ORAL_TABLET | Freq: Three times a day (TID) | ORAL | Status: DC | PRN

## 2012-12-04 NOTE — Discharge Summary (Signed)
Physician Discharge Summary  [name] YNW:[id number] DOB: 1946-01-07 DOA: 11/28/2012  PCP: Estill Cotta, MD  Admit date: 11/28/2012 Discharge date: 12/04/2012  Recommendations for Outpatient Follow-up:  1.  Home health PT/OT 2.  Follow up with oncology for next chemotherapy as already scheduled  Discharge Diagnoses:  Principal Problem:   Nausea vomiting and diarrhea Active Problems:   Myeloma   Anemia in chronic renal disease   Renal insufficiency   Hypothyroidism   GERD (gastroesophageal reflux disease)   Asthma   Discharge Condition: stable, improved  Diet recommendation: regular  Wt Readings from Last 3 Encounters:  11/28/12 56.019 kg (123 lb 8 oz)  11/14/12 55.792 kg (123 lb)  09/20/12 58.06 kg (128 lb)    History of present illness:   [name] is a 67 y.o. female  With history of MM followed by Dr. Myna Hidalgo present to the ED due to nausea, emesis and diarrhea with reports of > 8 bouts of each. Reportedly family members have come down with the similar symptoms. Patient reports that last night she developed the symptoms. Prior to her developing the symptoms she reports that she had eaten a salad she had prepared and denies eating any food that had been laying out for awhile. She also endorses fevers. Denies any blood or bile in emesis. No bloody diarrhea. The problem since onset has been persistent and getting worse. The phenergan in the ED has helped and she has felt better after getting IVF's.  In the ED patient had elevated HR at 107 improved after IVF rehydration, WBC of 3.5, Temp of 102.4. In ED no antibiotics were administered no blood cultures obtained.  We were consulted for further evaluation and recommendations given persistent nausea, emesis, and diarrhea  Hospital Course:   Nausea/vomiting/diarrhea, likely due to viral gastroenteritis.  She presented with intractable nausea and vomiting and copious diarrhea requiring IV fluids.  She was  tested for C. Diff and a stool culture was obtained.  She was started on cipro and flagyl and given scheduled zofran with phenergan as needed for breakthrough nausea.  It took several days for her to start tolerating small amounts of liquids.  She has progressed to tolerating a full diet.  Her diarrhea resolved with imodium and her stool studies were both negative.  Her antibiotics were discontinued.  Blood cultures were no growth final. She should be encouraged to eat and drink liberally at home.     E. Coli UTI: Completed 5 days of IV antibiotics.    Hypotension to 90s systolic. Resolved with IVF.    Hypothyroidism, TSH 2.49  Continued home regimen   Elevated D-dimer: Likely related to acute infection in the setting of malignancy. Was obtained in the setting of evaluating for DIC, which the patient clearly does not have. She has no symptoms of PE and is not tachycardic. Monitored clinically.    Myeloma with developing pancytopenia, likely due to viral suppression.  She was followed by Dr. Myna Hidalgo, her oncologist, who recommended no further work up at this time and her counts recovered spontaneously except for hemoglobin for which she was transfused 2 units PRBC.  WBC nadir at 1.3 on 2/12, hemoglobin 6.4 on 2/12, plt 54 on 2/13.     Anemia, improved after blood transfusion of 2 units and hgb rising.  Anemia panel with low iron level, however, ferritin is elevated and saturation is normal. May be indicative of infectious process and chronic disease  CKD stage III/IV.  Creatinine at baseline  of 1.3 to 1.8.    Asthma, remained stable. She continued albuterol   Vaginal discomfort, likely yeast infection in the setting of antibiotics, resolved with one dose of fluconazole.    Polypharmacy: Spoke at length with patient regarding simplifying her medication regimen. She declines allowing me to discuss changing her regimen except for transitioning her from xanax to ativan and taking xanax off of her home  medication list. She agrees that she has not used her muscle relaxants in several weeks to years and will talk about them with Dr. Gustavo Lah nurse. She likes to have the record of her medications in the system, she says, however, I advised her that the duplicates of similar medications can make medication errors more likely.    Consultants:  Hematology Procedures:  none Antibiotics:  Cipro 2/11 >> 2/16  Flagyl 2/11 >> 2/16   Discharge Exam: Filed Vitals:   12/04/12 0545  BP: 116/55  Pulse: 82  Temp: 98 F (36.7 C)  Resp: 20   Filed Vitals:   12/03/12 1028 12/03/12 1402 12/03/12 2145 12/04/12 0545  BP: 110/54 122/64 114/51 116/55  Pulse: 78 91 100 82  Temp: 98.9 F (37.2 C) 98.1 F (36.7 C) 97.6 F (36.4 C) 98 F (36.7 C)  TempSrc: Oral Oral Oral Oral  Resp: 17 18 20 20   Height:      Weight:      SpO2: 98% 100% 96% 98%    General: Caucasian female, no acute distress, pleasant and conversational HEENT: MMM  Cardiovascular: RRR, no mrg, 2+ pulses, warm extremities  Respiratory: CTAB  Abdomen: NABS, mildly distended, nontender.  MSK: Normal tone and bulk, trace LEE  Neuro: Grossly intact  Discharge Instructions      Discharge Orders   Future Appointments Provider Department Dept Phone   12/06/2012 9:45 AM Rachael Fee [facility] [phone]   12/06/2012 10:15 AM [facility] Chair 8 [place] [facility] [phone]   12/20/2012 9:30 AM Rachael Fee [place] [facility] [phone]   12/20/2012 10:00 AM Eunice Blase, PA [place] [facility] [phone]   12/20/2012 10:30 AM [facility] Bed 1 [place] [facility] [phone]   12/21/2012 10:00 AM [facility] Bed 1 [place] [facility] [phone]   12/27/2012 11:00 AM Rachael Fee [facility] [phone]   12/27/2012 11:30 AM [facility] 5 [place] [facility] [phone]   01/03/2013 9:00 AM Rachael Fee [facility] [phone]   01/03/2013 9:30 AM [facility] 2 [place] [facility] [phone]   01/17/2013 10:45 AM Rachael Fee [facility] [phone]   01/17/2013 11:15 AM Josph Macho, MD [place] [facility] [phone]   01/17/2013 12:00 PM [facility] Chair 6 [place] [facility] [phone]   Future Orders Complete By Expires     Call MD for:  difficulty breathing, headache or visual disturbances  As directed     Call MD for:  extreme fatigue  As directed     Call MD for:  hives  As directed     Call MD for:  persistant dizziness or light-headedness  As directed     Call MD for:  persistant nausea and vomiting  As directed  Call MD for:  severe uncontrolled pain  As directed     Call MD for:  temperature >100.4  As directed     Diet general  As directed     Discharge instructions  As directed     Comments:      You were hospitalized with severe nausea and vomiting and low blood counts.  You were given nausea medications and IV fluids.  You were started on IV antibiotics however your stool culture was negative, so they were discontinued.  Your blood counts improved, however, you did require 2 units of blood cells. Please follow up with your oncologist within 1-2 weeks.    Increase activity slowly  As directed         Medication List    STOP taking these medications       ALPRAZolam 0.25 MG tablet  Commonly known as:  XANAX     cyclobenzaprine 5 MG tablet  Commonly known as:  FLEXERIL     metaxalone 800 MG tablet  Commonly known as:  SKELAXIN     oxyCODONE 5 MG immediate release tablet  Commonly known as:  Oxy IR/ROXICODONE      TAKE these medications       acyclovir 400 MG tablet  Commonly known as:  ZOVIRAX  Take 400 mg by mouth 2 (two) times daily.     ALEVE 220 MG tablet  Generic  drug:  naproxen sodium  Take 440 mg by mouth every 8 (eight) hours as needed (For pain.).     benzonatate 100 MG capsule  Commonly known as:  TESSALON  Take 100 mg by mouth daily as needed. For cough.     DETROL LA 4 MG 24 hr capsule  Generic drug:  tolterodine  Take 4 mg by mouth every morning.     diphenoxylate-atropine 2.5-0.025 MG per tablet  Commonly known as:  LOMOTIL  Take 1 tablet by mouth 4 (four) times daily as needed. For diarrhea or loose stools.     DULoxetine 30 MG capsule  Commonly known as:  CYMBALTA  Take 30 mg by mouth every morning.     esomeprazole 40 MG capsule  Commonly known as:  NEXIUM  Take 40 mg by mouth every morning.     hydrOXYzine 25 MG tablet  Commonly known as:  ATARAX/VISTARIL  Take 25 mg by mouth 2 (two) times daily as needed. For itching.     levothyroxine 50 MCG tablet  Commonly known as:  SYNTHROID, LEVOTHROID  Take 50 mcg by mouth every morning.     LORazepam 0.5 MG tablet  Commonly known as:  ATIVAN  Take 1 tablet (0.5 mg total) by mouth every 8 (eight) hours as needed. For anxiety.     magic mouthwash Soln  Take 5 mLs by mouth 4 (four) times daily as needed (D[employer] formula--no Simethicone. For sores in mouth.).     nystatin 100000 UNIT/ML suspension  Commonly known as:  MYCOSTATIN  Take 500,000 Units by mouth 4 (four) times daily as needed. For sores in mouth.     olopatadine 0.1 % ophthalmic solution  Commonly known as:  PATANOL  Place 1 drop into both eyes 2 (two) times daily as needed (For allergies.).     ondansetron 8 MG tablet  Commonly known as:  ZOFRAN  Take 1 tablet (8 mg total) by mouth every 8 (eight) hours as needed for nausea. For nausea.     pirbuterol 200 MCG/INH inhaler  Commonly known as:  MAXAIR  Inhale 1-2 puffs into the lungs every 12 (twelve) hours as needed. For wheezing or shortness of breath.     promethazine 25 MG tablet  Commonly known as:  PHENERGAN  Take 0.5 tablets (12.5 mg total) by mouth  every 6 (six) hours as needed (For nausea or vomiting.).     temazepam 30 MG capsule  Commonly known as:  RESTORIL  Take 30 mg by mouth at bedtime.     Vitamin D (Ergocalciferol) 50000 UNITS Caps  Commonly known as:  DRISDOL  Take 50,000 Units by mouth every Monday.       Follow-up Information   Follow up with Josph Macho, MD. (already scheduled follow up)    Contact information:   [address] [place] [address] [phone]        The results of significant diagnostics from this hospitalization (including imaging, microbiology, ancillary and laboratory) are listed below for reference.    Significant Diagnostic Studies: No results found.  Microbiology: Recent Results (from the past 240 hour(s))  URINE CULTURE     Status: None   Collection Time    11/28/12 11:40 AM      Result Value Range Status   Specimen Description URINE, CATHETERIZED   Final   Special Requests NONE   Final   Culture  Setup Time 11/28/2012 18:32   Final   Colony Count 30,000 COLONIES/ML   Final   Culture ESCHERICHIA COLI   Final   Report Status 12/01/2012 FINAL   Final   Organism ID, Bacteria ESCHERICHIA COLI   Final  CULTURE, BLOOD (ROUTINE X 2)     Status: None   Collection Time    11/28/12  3:45 PM      Result Value Range Status   Specimen Description BLOOD LEFT ARM   Final   Special Requests BOTTLES DRAWN AEROBIC AND ANAEROBIC 5CC   Final   Culture  Setup Time 11/28/2012 20:36   Final   Culture NO GROWTH 5 DAYS   Final   Report Status 12/04/2012 FINAL   Final  CULTURE, BLOOD (ROUTINE X 2)     Status: None   Collection Time    11/28/12  3:51 PM      Result Value Range Status   Specimen Description BLOOD LEFT HAND   Final   Special Requests BOTTLES DRAWN AEROBIC AND ANAEROBIC 5CC   Final   Culture  Setup Time 11/28/2012 20:37   Final   Culture NO GROWTH 5 DAYS   Final   Report Status 12/04/2012 FINAL   Final  CLOSTRIDIUM DIFFICILE BY PCR     Status: None    Collection Time    11/28/12 10:56 PM      Result Value Range Status   C difficile by pcr NEGATIVE  NEGATIVE Final  STOOL CULTURE     Status: None   Collection Time    11/29/12  9:06 AM      Result Value Range Status   Specimen Description STOOL   Final   Special Requests Immunocompromised   Final   Culture     Final   Value: NO SALMONELLA, SHIGELLA, CAMPYLOBACTER, YERSINIA, OR E.COLI 0157:H7 ISOLATED   Report Status 12/03/2012 FINAL   Final     Labs: Basic Metabolic Panel:  Recent Labs Lab 11/28/12 0945 11/28/12 1731  11/30/12 0815 12/01/12 0443 12/02/12 0425 12/03/12 0458 12/04/12 1238  NA 139  --   < > 137 139 139 140 138  K 4.0  --   < > 2.9* 3.7 3.4* 3.6 4.1  CL 105  --   < > 106 109 110 112 105  CO2 22  --   < > 19 20 21 20 25   GLUCOSE 127*  --   < > 103* 96 129* 98 101*  BUN 26*  --   < > 11 8 7 6 12   CREATININE 1.70*  --   < > 1.51* 1.31* 1.28* 1.22* 1.48*  CALCIUM 8.4  --   < > 6.8* 7.3* 7.4* 7.8* 9.6  MG  --  1.5  --  1.3* 1.9  --   --   --   PHOS  --  2.8  --   --   --   --   --   --   < > = values in this interval not displayed. Liver Function Tests:  Recent Labs Lab 12/04/12 1238  AST 34  ALT 31  ALKPHOS 107  BILITOT 0.5  PROT 6.2  ALBUMIN 3.7   No results found for this basename: LIPASE, AMYLASE,  in the last 168 hours No results found for this basename: AMMONIA,  in the last 168 hours CBC:  Recent Labs Lab 11/28/12 0945  11/29/12 1040  11/30/12 0815 12/01/12 0443 12/02/12 0425 12/03/12 0458 12/04/12 1238  WBC 3.5*  < > 1.3*  --  2.1* 2.3* 2.3* 2.3* 3.7*  NEUTROABS 3.2  --  1.1*  --   --   --   --   --   --   HGB 8.1*  < > 6.4*  < > 9.6* 9.8* 10.0* 9.8* 11.5*  HCT 24.8*  < > 20.3*  < > 28.9* 29.4* 30.2* 29.7* 35.4*  MCV 108.3*  < > 110.9*  --  101.0* 101.4* 102.7* 103.1* 102.9*  PLT 81*  < > 57*  59*  --  54* 57* 59* 58* 95*  < > = values in this interval not displayed. Cardiac Enzymes: No results found for this basename: CKTOTAL,  CKMB, CKMBINDEX, TROPONINI,  in the last 168 hours BNP: BNP (last 3 results) No results found for this basename: PROBNP,  in the last 8760 hours CBG: No results found for this basename: GLUCAP,  in the last 168 hours  Time coordinating discharge: 45 minutes  Signed:  Alizae Bechtel  [facility] 12/04/2012, 1:53 PM

## 2012-12-04 NOTE — Progress Notes (Signed)
Mrs. [name] is feeling well. She's not complaining of any nausea. She feels her appetite is improving. Does note diarrhea.  She has a Escherichia coli urinary tract infection. She completed a five-day course of IV antibiotics for this.   We are awaiting the lab work for her today. Laboratory test yesterday looked all right.  She feels as if you could go home. I suspect that we probably could get her home.  On her physical exam her vital signs show temperature be 98 pulse 82 respiratory 20 blood pressure 160/55. Lungs are clear bilaterally. There is no rales wheezes or rhonchi. Cardiac exam regular rate and rhythm with a normal S1-S2. There are no murmurs rubs or bruits abdominal exam soft. She has decent bowel sounds. There is no guarding or rebound tenderness. There is no fluid wave. There is no palpable hepatosplenomegaly . Extremities shows the chronic changes in her right thigh. Neurological exam shows no focal neurological deficits.  Again, we are awaiting the lab work today. I do suspect that she should be able to go home.   Pete E.  Ephesians 6:16

## 2012-12-04 NOTE — Progress Notes (Signed)
tPA removed from port.  No blood return, flushed with 3- 10cc NS with no blood return.  Port flushed with Heparin 100u/ml and de-accessed. Pt has a double port, lateral port accessed, sluggish blood return from port, purple tube (CBC) collected, unable to get green (BMP) tube.  Port flushed with 10NS and Heparin 100u/ml (5ml) and de-accessed. Blood sent to lab. [name]

## 2012-12-05 ENCOUNTER — Telehealth: Payer: Self-pay | Admitting: Hematology & Oncology

## 2012-12-05 NOTE — Telephone Encounter (Signed)
Pt aware next appointment is 12-13-12

## 2012-12-06 ENCOUNTER — Ambulatory Visit: Payer: Medicare Other

## 2012-12-06 ENCOUNTER — Other Ambulatory Visit: Payer: Medicare Other | Admitting: Lab

## 2012-12-07 ENCOUNTER — Encounter: Payer: Self-pay | Admitting: Hematology & Oncology

## 2012-12-09 ENCOUNTER — Other Ambulatory Visit: Payer: Self-pay | Admitting: Hematology & Oncology

## 2012-12-09 ENCOUNTER — Other Ambulatory Visit: Payer: Self-pay | Admitting: Internal Medicine

## 2012-12-11 ENCOUNTER — Other Ambulatory Visit: Payer: Self-pay | Admitting: *Deleted

## 2012-12-11 NOTE — Telephone Encounter (Signed)
Script for Advair discontinued

## 2012-12-13 ENCOUNTER — Other Ambulatory Visit (HOSPITAL_BASED_OUTPATIENT_CLINIC_OR_DEPARTMENT_OTHER): Payer: Medicare Other | Admitting: Lab

## 2012-12-13 ENCOUNTER — Ambulatory Visit (HOSPITAL_BASED_OUTPATIENT_CLINIC_OR_DEPARTMENT_OTHER): Payer: Medicare Other

## 2012-12-13 VITALS — BP 99/46 | HR 84 | Temp 98.4°F | Resp 18

## 2012-12-13 DIAGNOSIS — N189 Chronic kidney disease, unspecified: Secondary | ICD-10-CM

## 2012-12-13 DIAGNOSIS — D801 Nonfamilial hypogammaglobulinemia: Secondary | ICD-10-CM

## 2012-12-13 DIAGNOSIS — C9000 Multiple myeloma not having achieved remission: Secondary | ICD-10-CM

## 2012-12-13 DIAGNOSIS — Z5112 Encounter for antineoplastic immunotherapy: Secondary | ICD-10-CM

## 2012-12-13 DIAGNOSIS — D649 Anemia, unspecified: Secondary | ICD-10-CM

## 2012-12-13 LAB — CBC WITH DIFFERENTIAL (CANCER CENTER ONLY)
HGB: 10.7 g/dL — ABNORMAL LOW (ref 11.6–15.9)
MCV: 105 fL — ABNORMAL HIGH (ref 81–101)
Platelets: 161 10*3/uL (ref 145–400)
RBC: 3.09 10*6/uL — ABNORMAL LOW (ref 3.70–5.32)
WBC: 3.5 10*3/uL — ABNORMAL LOW (ref 3.9–10.0)

## 2012-12-13 LAB — MANUAL DIFFERENTIAL (CHCC SATELLITE)
Band Neutrophils: 3 % (ref 0–10)
Eos: 2 % (ref 0–7)
SEG: 57 % (ref 40–75)

## 2012-12-13 MED ORDER — DIPHENHYDRAMINE HCL 25 MG PO CAPS
25.0000 mg | ORAL_CAPSULE | Freq: Once | ORAL | Status: AC
  Administered 2012-12-13: 25 mg via ORAL

## 2012-12-13 MED ORDER — ZOLEDRONIC ACID 4 MG/5ML IV CONC
3.3000 mg | Freq: Once | INTRAVENOUS | Status: AC
  Administered 2012-12-13: 3.3 mg via INTRAVENOUS
  Filled 2012-12-13: qty 4.13

## 2012-12-13 MED ORDER — DARBEPOETIN ALFA-POLYSORBATE 300 MCG/0.6ML IJ SOLN: 300.0000 ug | Freq: Once | INTRAMUSCULAR | Status: DC

## 2012-12-13 MED ORDER — ACETAMINOPHEN 325 MG PO TABS
650.0000 mg | ORAL_TABLET | Freq: Once | ORAL | Status: AC
  Administered 2012-12-13: 650 mg via ORAL

## 2012-12-13 MED ORDER — IMMUNE GLOBULIN (HUMAN) 10 GM/200ML IV SOLN: 40.0000 g | Freq: Once | INTRAVENOUS | Status: DC

## 2012-12-13 MED ORDER — BORTEZOMIB CHEMO SQ INJECTION 3.5 MG (2.5MG/ML)
1.3000 mg/m2 | Freq: Once | INTRAMUSCULAR | Status: AC
  Administered 2012-12-13: 2 mg via SUBCUTANEOUS
  Filled 2012-12-13: qty 2

## 2012-12-13 MED ORDER — IMMUNE GLOBULIN (HUMAN) 20 GM/200ML IV SOLN
700.0000 mg/kg | Freq: Once | INTRAVENOUS | Status: AC
  Administered 2012-12-13: 40 g via INTRAVENOUS
  Filled 2012-12-13: qty 400

## 2012-12-13 MED ORDER — ONDANSETRON HCL 8 MG PO TABS
8.0000 mg | ORAL_TABLET | Freq: Once | ORAL | Status: AC
  Administered 2012-12-13: 8 mg via ORAL

## 2012-12-13 MED ORDER — HEPARIN SOD (PORK) LOCK FLUSH 100 UNIT/ML IV SOLN
500.0000 [IU] | Freq: Once | INTRAVENOUS | Status: AC
  Administered 2012-12-13: 500 [IU] via INTRAVENOUS
  Filled 2012-12-13: qty 5

## 2012-12-13 MED ORDER — SODIUM CHLORIDE 0.9 % IJ SOLN
10.0000 mL | INTRAMUSCULAR | Status: DC | PRN
  Administered 2012-12-13: 10 mL via INTRAVENOUS
  Filled 2012-12-13: qty 10

## 2012-12-13 NOTE — Patient Instructions (Addendum)

## 2012-12-18 ENCOUNTER — Other Ambulatory Visit: Payer: Self-pay | Admitting: Hematology & Oncology

## 2012-12-18 ENCOUNTER — Telehealth: Payer: Self-pay | Admitting: Hematology & Oncology

## 2012-12-18 DIAGNOSIS — Z1231 Encounter for screening mammogram for malignant neoplasm of breast: Secondary | ICD-10-CM

## 2012-12-18 NOTE — Telephone Encounter (Signed)
Patient called and cx 12/19/12 apt due to bad weather and resch for 12/20/12.  Spoke with RN before scheduling apt. Per RN ok to sch and double book apt on 12/20/12.  Patient is aware of apt date/time

## 2012-12-19 ENCOUNTER — Ambulatory Visit: Payer: Medicare Other | Admitting: Medical

## 2012-12-19 ENCOUNTER — Telehealth: Payer: Self-pay | Admitting: Internal Medicine

## 2012-12-19 ENCOUNTER — Ambulatory Visit: Payer: Medicare Other

## 2012-12-19 ENCOUNTER — Ambulatory Visit: Payer: Medicare Other | Admitting: Internal Medicine

## 2012-12-19 ENCOUNTER — Other Ambulatory Visit: Payer: Medicare Other | Admitting: Lab

## 2012-12-19 NOTE — Telephone Encounter (Signed)
Megan from [facility] called stating that she needs orders from the physician stating that patient needs a walker with a seat.   P: [phone] ext. [phone]

## 2012-12-19 NOTE — Telephone Encounter (Signed)
Please advise 

## 2012-12-20 ENCOUNTER — Other Ambulatory Visit: Payer: Self-pay | Admitting: Family Medicine

## 2012-12-20 ENCOUNTER — Ambulatory Visit: Payer: Medicare Other

## 2012-12-20 ENCOUNTER — Other Ambulatory Visit (HOSPITAL_BASED_OUTPATIENT_CLINIC_OR_DEPARTMENT_OTHER): Payer: Medicare Other | Admitting: Lab

## 2012-12-20 ENCOUNTER — Ambulatory Visit (HOSPITAL_BASED_OUTPATIENT_CLINIC_OR_DEPARTMENT_OTHER): Payer: Medicare Other | Admitting: Oncology

## 2012-12-20 ENCOUNTER — Other Ambulatory Visit: Payer: Medicare Other | Admitting: Lab

## 2012-12-20 ENCOUNTER — Ambulatory Visit: Payer: Medicare Other | Admitting: Medical

## 2012-12-20 VITALS — BP 120/56 | HR 86 | Temp 97.9°F | Resp 16 | Ht 59.0 in | Wt 120.0 lb

## 2012-12-20 DIAGNOSIS — C9000 Multiple myeloma not having achieved remission: Secondary | ICD-10-CM

## 2012-12-20 DIAGNOSIS — R2681 Unsteadiness on feet: Secondary | ICD-10-CM

## 2012-12-20 DIAGNOSIS — N189 Chronic kidney disease, unspecified: Secondary | ICD-10-CM

## 2012-12-20 DIAGNOSIS — N039 Chronic nephritic syndrome with unspecified morphologic changes: Secondary | ICD-10-CM

## 2012-12-20 DIAGNOSIS — D631 Anemia in chronic kidney disease: Secondary | ICD-10-CM

## 2012-12-20 DIAGNOSIS — D6959 Other secondary thrombocytopenia: Secondary | ICD-10-CM

## 2012-12-20 LAB — TECHNOLOGIST REVIEW CHCC SATELLITE

## 2012-12-20 LAB — CMP (CANCER CENTER ONLY)
ALT(SGPT): 44 U/L (ref 10–47)
Albumin: 3.3 g/dL (ref 3.3–5.5)
CO2: 26 mEq/L (ref 18–33)
Calcium: 9 mg/dL (ref 8.0–10.3)
Chloride: 102 mEq/L (ref 98–108)
Glucose, Bld: 95 mg/dL (ref 73–118)
Sodium: 143 mEq/L (ref 128–145)
Total Bilirubin: 0.6 mg/dl (ref 0.20–1.60)
Total Protein: 7.1 g/dL (ref 6.4–8.1)

## 2012-12-20 LAB — CBC WITH DIFFERENTIAL (CANCER CENTER ONLY)
BASO%: 1.1 % (ref 0.0–2.0)
Eosinophils Absolute: 0.1 10*3/uL (ref 0.0–0.5)
HCT: 30.3 % — ABNORMAL LOW (ref 34.8–46.6)
LYMPH#: 0.5 10*3/uL — ABNORMAL LOW (ref 0.9–3.3)
LYMPH%: 18.1 % (ref 14.0–48.0)
MCV: 108 fL — ABNORMAL HIGH (ref 81–101)
MONO#: 0.6 10*3/uL (ref 0.1–0.9)
NEUT%: 55.9 % (ref 39.6–80.0)
Platelets: 68 10*3/uL — ABNORMAL LOW (ref 145–400)
RBC: 2.8 10*6/uL — ABNORMAL LOW (ref 3.70–5.32)
RDW: 18.6 % — ABNORMAL HIGH (ref 11.1–15.7)
WBC: 2.8 10*3/uL — ABNORMAL LOW (ref 3.9–10.0)

## 2012-12-20 NOTE — Progress Notes (Signed)
No chemo today per dr. Truett Perna due to low counts.

## 2012-12-20 NOTE — Telephone Encounter (Signed)
Verbal order given to [facility] at [facility]. Pt has a femur fracture and Gait issues

## 2012-12-20 NOTE — Telephone Encounter (Signed)
I have written an order for rolling walker please call and fax order to Home health for patient. Need ot know what she needs it for

## 2012-12-20 NOTE — Progress Notes (Signed)
    Cancer Center    OFFICE PROGRESS NOTE   INTERVAL HISTORY:   She was admitted on 11/28/2012 with nausea/vomiting and diarrhea. She was diagnosed with a urinary tract infection. She was felt to most likely have a viral gastroenteritis.  She received Velcade and IVIG on 12/13/2012. Ms. Kilner reports chronic diarrhea that is relieved with Lomotil. She has diarrhea 2-3 times per day when she does not take Lomotil. No other complaint today  Objective:  Vital signs in last 24 hours:  Blood pressure 120/56, pulse 86, temperature 97.9 F (36.6 C), temperature source Oral, resp. rate 16, height 4\' 11"  (1.499 m), weight 120 lb (54.432 kg).    HEENT: No thrush or ulcers Resp: Lungs clear bilaterally Cardio: Regular rate and rhythm GI: No hepatosplenomegaly, nontender Vascular: No leg edema   Portacath/PICC-without erythema  Lab Results:  Lab Results  Component Value Date   WBC 2.8* 12/20/2012   HGB 9.6* 12/20/2012   HCT 30.3* 12/20/2012   MCV 108* 12/20/2012   PLT 68* 12/20/2012   ANC 1.6  Potassium 3.6, creatinine 1.4    Medications: I have reviewed the patient's current medications.  Assessment/Plan:  1. Multiple myeloma-currently being treated with bendamustine/Velcade/Decadron, stable disease on a restaging bone marrow biopsy January 2014  2. 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 Thrombocytopenia-? Secondary to bendamustine versus Velcade  4. Chronic diarrhea-? Related to Velcade  5. Chronic renal insufficiency  Disposition:  We decided to hold the scheduled Velcade today secondary to thrombocytopenia. She has completed 5 cycles of bendamustine/Velcade/Decadron. A bone marrow biopsy 11/07/2012 revealed stable disease. It is unclear whether she is benefiting from the current systemic regimen. She has persistent transfusion-dependent anemia.  She will return for an office visit and CBC in one week. We will  communicate with Dr. Myna Hidalgo to decide on continuing the current regimen versus referring her to Dr. Marissa Calamity to consider an immunotherapy trial.   Thornton Papas, MD  12/20/2012  12:49 PM

## 2012-12-21 ENCOUNTER — Ambulatory Visit: Payer: Medicare Other

## 2012-12-22 ENCOUNTER — Ambulatory Visit (HOSPITAL_COMMUNITY): Payer: Medicare Other

## 2012-12-26 ENCOUNTER — Other Ambulatory Visit: Payer: Self-pay | Admitting: Hematology & Oncology

## 2012-12-27 ENCOUNTER — Telehealth: Payer: Self-pay | Admitting: Hematology & Oncology

## 2012-12-27 ENCOUNTER — Ambulatory Visit: Payer: Medicare Other

## 2012-12-27 ENCOUNTER — Ambulatory Visit (HOSPITAL_BASED_OUTPATIENT_CLINIC_OR_DEPARTMENT_OTHER): Payer: Medicare Other | Admitting: Lab

## 2012-12-27 ENCOUNTER — Ambulatory Visit (HOSPITAL_BASED_OUTPATIENT_CLINIC_OR_DEPARTMENT_OTHER): Payer: Medicare Other | Admitting: Oncology

## 2012-12-27 DIAGNOSIS — C9000 Multiple myeloma not having achieved remission: Secondary | ICD-10-CM

## 2012-12-27 DIAGNOSIS — D696 Thrombocytopenia, unspecified: Secondary | ICD-10-CM

## 2012-12-27 DIAGNOSIS — N039 Chronic nephritic syndrome with unspecified morphologic changes: Secondary | ICD-10-CM

## 2012-12-27 DIAGNOSIS — N189 Chronic kidney disease, unspecified: Secondary | ICD-10-CM

## 2012-12-27 DIAGNOSIS — D631 Anemia in chronic kidney disease: Secondary | ICD-10-CM

## 2012-12-27 LAB — CBC WITH DIFFERENTIAL (CANCER CENTER ONLY)
BASO%: 0.4 % (ref 0.0–2.0)
EOS%: 3 % (ref 0.0–7.0)
LYMPH#: 0.7 10*3/uL — ABNORMAL LOW (ref 0.9–3.3)
MCH: 35.1 pg — ABNORMAL HIGH (ref 26.0–34.0)
MCHC: 32.3 g/dL (ref 32.0–36.0)
MONO%: 18 % — ABNORMAL HIGH (ref 0.0–13.0)
NEUT#: 2.9 10*3/uL (ref 1.5–6.5)
Platelets: 67 10*3/uL — ABNORMAL LOW (ref 145–400)
RBC: 2.62 10*6/uL — ABNORMAL LOW (ref 3.70–5.32)

## 2012-12-27 NOTE — Telephone Encounter (Signed)
Tiffany aware of referral to [facility]

## 2012-12-27 NOTE — Progress Notes (Signed)
   [facility]    OFFICE PROGRESS NOTE   INTERVAL HISTORY:   She returns as scheduled. No new complaint.  Objective:  Vital signs in last 24 hours:  Physical exam-not performed today  Lab Results:  Lab Results  Component Value Date   WBC 4.6 12/27/2012   HGB 9.2* 12/27/2012   HCT 28.5* 12/27/2012   MCV 109* 12/27/2012   PLT 67* 12/27/2012   ANC 2.9    Medications: I have reviewed the patient's current medications.  Assessment/Plan: 1. Multiple myeloma-currently being treated with bendamustine/Velcade/Decadron, stable disease on a restaging bone marrow biopsy January 2014  2. 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 Thrombocytopenia-? Secondary to bendamustine versus Velcade  4. Chronic diarrhea-? Related to Velcade  5. Chronic renal insufficiency   Disposition:  She appears stable. She has completed 5 cycles of bendamustine/Velcade/Decadron. She continues to receive intermittent red cell transfusions. It is not clear whether the persistent thrombocytopenia is related to myeloma or chemotherapy toxicity.  I discussed treatment options with Ms. Ezzell and her daughter. I discussed the case with Dr. Myna Hidalgo. Current options include proceeding with another cycle of bendamustine/Velcade/Decadron, observation, or a referral to consider a clinical trial. She would like to see Dr. Marissa Calamity to consider enrollment on the daratumumab study.  We will initiate a referral to Dr. Marissa Calamity. She will return for an office visit in 2-3 weeks.   Thornton Papas, MD  12/27/2012  3:43 PM

## 2012-12-27 NOTE — Progress Notes (Signed)
No treatment per doctor Thornton Papas.

## 2012-12-29 ENCOUNTER — Ambulatory Visit (HOSPITAL_COMMUNITY): Payer: Medicare Other

## 2013-01-02 ENCOUNTER — Ambulatory Visit (HOSPITAL_COMMUNITY): Payer: Medicare Other

## 2013-01-03 ENCOUNTER — Ambulatory Visit (HOSPITAL_BASED_OUTPATIENT_CLINIC_OR_DEPARTMENT_OTHER): Payer: Medicare Other | Admitting: Lab

## 2013-01-03 ENCOUNTER — Other Ambulatory Visit: Payer: Medicare Other | Admitting: Lab

## 2013-01-03 ENCOUNTER — Ambulatory Visit: Payer: Medicare Other

## 2013-01-03 DIAGNOSIS — D631 Anemia in chronic kidney disease: Secondary | ICD-10-CM

## 2013-01-03 DIAGNOSIS — C9000 Multiple myeloma not having achieved remission: Secondary | ICD-10-CM

## 2013-01-03 DIAGNOSIS — N189 Chronic kidney disease, unspecified: Secondary | ICD-10-CM

## 2013-01-03 LAB — CBC WITH DIFFERENTIAL (CANCER CENTER ONLY)
Eosinophils Absolute: 0.1 10*3/uL (ref 0.0–0.5)
HGB: 8.9 g/dL — ABNORMAL LOW (ref 11.6–15.9)
LYMPH#: 0.7 10*3/uL — ABNORMAL LOW (ref 0.9–3.3)
MONO#: 0.8 10*3/uL (ref 0.1–0.9)
MONO%: 13.7 % — ABNORMAL HIGH (ref 0.0–13.0)
NEUT#: 4 10*3/uL (ref 1.5–6.5)
Platelets: 70 10*3/uL — ABNORMAL LOW (ref 145–400)
RBC: 2.55 10*6/uL — ABNORMAL LOW (ref 3.70–5.32)
WBC: 5.6 10*3/uL (ref 3.9–10.0)

## 2013-01-04 ENCOUNTER — Telehealth: Payer: Self-pay | Admitting: Oncology

## 2013-01-04 NOTE — Telephone Encounter (Signed)
Pt appt.with Dr. Marissa Calamity @ [facility] is 02/07/13@1 :00. Faxed medical records. Pt is aware.

## 2013-01-05 LAB — KAPPA/LAMBDA LIGHT CHAINS: Kappa free light chain: 4.18 mg/dL — ABNORMAL HIGH (ref 0.33–1.94)

## 2013-01-05 LAB — PROTEIN ELECTROPHORESIS, SERUM, WITH REFLEX: Gamma Globulin: 11.7 % (ref 11.1–18.8)

## 2013-01-05 LAB — IGG, IGA, IGM
IgA: <7 mg/dL — ABNORMAL LOW (ref 69–380)
IgM, Serum: <4 mg/dL — ABNORMAL LOW (ref 52–322)

## 2013-01-08 ENCOUNTER — Other Ambulatory Visit (HOSPITAL_BASED_OUTPATIENT_CLINIC_OR_DEPARTMENT_OTHER): Payer: Medicare Other | Admitting: Lab

## 2013-01-08 ENCOUNTER — Encounter (HOSPITAL_COMMUNITY)
Admission: RE | Admit: 2013-01-08 | Discharge: 2013-01-08 | Disposition: A | Discharge status: Home / Self Care | Payer: Medicare Other | Source: Ambulatory Visit | Attending: Hematology & Oncology | Admitting: Hematology & Oncology

## 2013-01-08 ENCOUNTER — Ambulatory Visit (HOSPITAL_BASED_OUTPATIENT_CLINIC_OR_DEPARTMENT_OTHER): Payer: Medicare Other

## 2013-01-08 ENCOUNTER — Other Ambulatory Visit: Payer: Self-pay | Admitting: Hematology & Oncology

## 2013-01-08 ENCOUNTER — Ambulatory Visit (HOSPITAL_BASED_OUTPATIENT_CLINIC_OR_DEPARTMENT_OTHER): Payer: Medicare Other | Admitting: Hematology & Oncology

## 2013-01-08 VITALS — BP 177/50 | HR 87 | Temp 98.1°F | Resp 18 | Ht 59.0 in | Wt 120.0 lb

## 2013-01-08 VITALS — BP 101/69 | HR 70 | Temp 97.0°F

## 2013-01-08 DIAGNOSIS — C9000 Multiple myeloma not having achieved remission: Secondary | ICD-10-CM

## 2013-01-08 DIAGNOSIS — D649 Anemia, unspecified: Secondary | ICD-10-CM | POA: Insufficient documentation

## 2013-01-08 DIAGNOSIS — C9002 Multiple myeloma in relapse: Secondary | ICD-10-CM

## 2013-01-08 DIAGNOSIS — N189 Chronic kidney disease, unspecified: Secondary | ICD-10-CM

## 2013-01-08 LAB — CBC WITH DIFFERENTIAL (CANCER CENTER ONLY)
BASO%: 0.5 % (ref 0.0–2.0)
LYMPH%: 11.2 % — ABNORMAL LOW (ref 14.0–48.0)
MCH: 35.2 pg — ABNORMAL HIGH (ref 26.0–34.0)
MCHC: 31.5 g/dL — ABNORMAL LOW (ref 32.0–36.0)
MCV: 112 fL — ABNORMAL HIGH (ref 81–101)
MONO%: 11.7 % (ref 0.0–13.0)
Platelets: 71 10*3/uL — ABNORMAL LOW (ref 145–400)
RDW: 18.8 % — ABNORMAL HIGH (ref 11.1–15.7)

## 2013-01-08 MED ORDER — IMMUNE GLOBULIN (HUMAN) 20 GM/200ML IV SOLN
0.7000 g/kg | Freq: Once | INTRAVENOUS | Status: AC
  Administered 2013-01-08: 40 g via INTRAVENOUS
  Filled 2013-01-08: qty 400

## 2013-01-08 MED ORDER — DIPHENHYDRAMINE HCL 25 MG PO CAPS
25.0000 mg | ORAL_CAPSULE | Freq: Once | ORAL | Status: AC
  Administered 2013-01-08: 25 mg via ORAL

## 2013-01-08 MED ORDER — ACETAMINOPHEN 325 MG PO TABS
650.0000 mg | ORAL_TABLET | Freq: Once | ORAL | Status: AC
  Administered 2013-01-08: 650 mg via ORAL

## 2013-01-08 MED ORDER — DARBEPOETIN ALFA-POLYSORBATE 300 MCG/0.6ML IJ SOLN
300.0000 ug | Freq: Once | INTRAMUSCULAR | Status: AC
  Administered 2013-01-08: 300 ug via SUBCUTANEOUS

## 2013-01-08 MED ORDER — LORAZEPAM 2 MG/ML IJ SOLN
1.0000 mg | Freq: Once | INTRAMUSCULAR | Status: AC
  Administered 2013-01-08: 1 mg via INTRAVENOUS

## 2013-01-08 MED ORDER — ZOLEDRONIC ACID 4 MG/5ML IV CONC
3.3000 mg | Freq: Once | INTRAVENOUS | Status: AC
  Administered 2013-01-08: 3.3 mg via INTRAVENOUS
  Filled 2013-01-08: qty 4.13

## 2013-01-08 NOTE — Patient Instructions (Signed)

## 2013-01-09 ENCOUNTER — Ambulatory Visit (HOSPITAL_COMMUNITY): Payer: Medicare Other

## 2013-01-09 ENCOUNTER — Ambulatory Visit (HOSPITAL_BASED_OUTPATIENT_CLINIC_OR_DEPARTMENT_OTHER): Payer: Medicare Other

## 2013-01-09 VITALS — BP 127/74 | HR 79 | Temp 98.0°F | Resp 18

## 2013-01-09 DIAGNOSIS — N039 Chronic nephritic syndrome with unspecified morphologic changes: Secondary | ICD-10-CM

## 2013-01-09 DIAGNOSIS — C9002 Multiple myeloma in relapse: Secondary | ICD-10-CM

## 2013-01-09 MED ORDER — SODIUM CHLORIDE 0.9 % IJ SOLN
10.0000 mL | INTRAMUSCULAR | Status: AC | PRN
  Administered 2013-01-09: 10 mL
  Filled 2013-01-09: qty 10

## 2013-01-09 MED ORDER — FUROSEMIDE 10 MG/ML IJ SOLN
20.0000 mg | Freq: Once | INTRAMUSCULAR | Status: AC
  Administered 2013-01-09: 10 mg via INTRAVENOUS

## 2013-01-09 MED ORDER — SODIUM CHLORIDE 0.9 % IV SOLN
250.0000 mL | Freq: Once | INTRAVENOUS | Status: AC
  Administered 2013-01-09: 250 mL via INTRAVENOUS

## 2013-01-09 MED ORDER — DIPHENHYDRAMINE HCL 25 MG PO CAPS
25.0000 mg | ORAL_CAPSULE | Freq: Four times a day (QID) | ORAL | Status: DC | PRN
  Administered 2013-01-09: 25 mg via ORAL

## 2013-01-09 MED ORDER — LORAZEPAM 1 MG PO TABS
1.0000 mg | ORAL_TABLET | Freq: Once | ORAL | Status: AC
  Administered 2013-01-09: 1 mg via ORAL

## 2013-01-09 MED ORDER — ACETAMINOPHEN 325 MG PO TABS
650.0000 mg | ORAL_TABLET | Freq: Once | ORAL | Status: AC
  Administered 2013-01-09: 650 mg via ORAL

## 2013-01-09 MED ORDER — HEPARIN SOD (PORK) LOCK FLUSH 100 UNIT/ML IV SOLN
500.0000 [IU] | Freq: Every day | INTRAVENOUS | Status: AC | PRN
  Administered 2013-01-09: 500 [IU]
  Filled 2013-01-09: qty 5

## 2013-01-09 NOTE — Patient Instructions (Addendum)
Blood Transfusion Information WHAT IS A BLOOD TRANSFUSION? A transfusion is the replacement of blood or some of its parts. Blood is made up of multiple cells which provide different functions.  Red blood cells carry oxygen and are used for blood loss replacement.  White blood cells fight against infection.  Platelets control bleeding.  Plasma helps clot blood.  Other blood products are available for specialized needs, such as hemophilia or other clotting disorders. BEFORE THE TRANSFUSION  Who gives blood for transfusions?   You may be able to donate blood to be used at a later date on yourself (autologous donation).  Relatives can be asked to donate blood. This is generally not any safer than if you have received blood from a stranger. The same precautions are taken to ensure safety when a relative's blood is donated.  Healthy volunteers who are fully evaluated to make sure their blood is safe. This is blood bank blood. Transfusion therapy is the safest it has ever been in the practice of medicine. Before blood is taken from a donor, a complete history is taken to make sure that person has no history of diseases nor engages in risky social behavior (examples are intravenous drug use or sexual activity with multiple partners). The donor's travel history is screened to minimize risk of transmitting infections, such as malaria. The donated blood is tested for signs of infectious diseases, such as HIV and hepatitis. The blood is then tested to be sure it is compatible with you in order to minimize the chance of a transfusion reaction. If you or a relative donates blood, this is often done in anticipation of surgery and is not appropriate for emergency situations. It takes many days to process the donated blood. RISKS AND COMPLICATIONS Although transfusion therapy is very safe and saves many lives, the main dangers of transfusion include:   Getting an infectious disease.  Developing a  transfusion reaction. This is an allergic reaction to something in the blood you were given. Every precaution is taken to prevent this. The decision to have a blood transfusion has been considered carefully by your caregiver before blood is given. Blood is not given unless the benefits outweigh the risks. AFTER THE TRANSFUSION  Right after receiving a blood transfusion, you will usually feel much better and more energetic. This is especially true if your red blood cells have gotten low (anemic). The transfusion raises the level of the red blood cells which carry oxygen, and this usually causes an energy increase.  The nurse administering the transfusion will monitor you carefully for complications. HOME CARE INSTRUCTIONS  No special instructions are needed after a transfusion. You may find your energy is better. Speak with your caregiver about any limitations on activity for underlying diseases you may have. SEEK MEDICAL CARE IF:   Your condition is not improving after your transfusion.  You develop redness or irritation at the intravenous (IV) site. SEEK IMMEDIATE MEDICAL CARE IF:  Any of the following symptoms occur over the next 12 hours:  Shaking chills.  You have a temperature by mouth above 102 F (38.9 C), not controlled by medicine.  Chest, back, or muscle pain.  People around you feel you are not acting correctly or are confused.  Shortness of breath or difficulty breathing.  Dizziness and fainting.  You get a rash or develop hives.  You have a decrease in urine output.  Your urine turns a dark color or changes to pink, red, or brown. Any of the following   symptoms occur over the next 10 days:  You have a temperature by mouth above 102 F (38.9 C), not controlled by medicine.  Shortness of breath.  Weakness after normal activity.  The white part of the eye turns yellow (jaundice).  You have a decrease in the amount of urine or are urinating less often.  Your  urine turns a dark color or changes to pink, red, or brown. Document Released: 10/01/2000 Document Revised: 12/27/2011 Document Reviewed: 05/20/2008 [employer] Patient Information 2013 [employer].  

## 2013-01-09 NOTE — Progress Notes (Signed)
This office note has been dictated.

## 2013-01-10 ENCOUNTER — Telehealth: Payer: Self-pay | Admitting: Hematology & Oncology

## 2013-01-10 LAB — IGG, IGA, IGM
IgA: <6 mg/dL — ABNORMAL LOW (ref 69–380)
IgG (Immunoglobin G), Serum: 844 mg/dL (ref 690–1700)

## 2013-01-10 LAB — TYPE AND SCREEN: Unit division: 0

## 2013-01-10 LAB — PROTEIN ELECTROPHORESIS, SERUM, WITH REFLEX
Beta Globulin: 5.5 % (ref 4.7–7.2)
Total Protein, Serum Electrophoresis: 6.3 g/dL (ref 6.0–8.3)

## 2013-01-10 LAB — KAPPA/LAMBDA LIGHT CHAINS: Kappa:Lambda Ratio: 107.4 — ABNORMAL HIGH (ref 0.26–1.65)

## 2013-01-10 NOTE — Telephone Encounter (Signed)
Moved 4-24 to 5-1

## 2013-01-10 NOTE — Progress Notes (Signed)
DIAGNOSIS:  Refractory kappa light chain myeloma.  CURRENT THERAPY: 1. The patient had been receiving bendamustine/Velcade/Decadron. 2. IVIG monthly for recurrent pneumonia. 3. Zometa 3.3 mg IV monthly. 4. Aranesp 300 mcg subcu q.3 weeks for hemoglobin less than 10.  INTERIM HISTORY:  [name] comes in for followup.  She saw Dr. Truett Perna in my absence back on March 12th.  At that point in time, it was decided that she should consider a clinical trial.  She was referred to [facility].  She had an appointment on April 23rd.  She and her family are quite worried about how long this is going to take for her to be seen.  I tried to reassure her that she is going to be okay and that her myeloma was not going to overwhelm her and kill her.  She really does not have good markers in her blood for identifying myeloma progression.  We just had to do a bone marrow test on her.  Her last myeloma studies that we did on her showed her serum kappa light chain to be 4.18 mg/dL.  She does have some mild renal insufficiency.  Since we have been doing the IVIG, she has done real well without any problems with pneumonia.  There has been no bleeding.  She did have some GI bleeding about a year and a half or so ago.  We are watching this.  Her last iron studies done back in February showed an iron saturation of 27% with an iron of 36.  PHYSICAL EXAM:  General:  This is a somewhat petite white female in no obvious distress.  Vital signs:  Temperature of 98.1, pulse 87, respiratory rate 18, blood pressure 177/50.  Weight is 120.  Head and neck:  Shows a normocephalic, atraumatic skull.  There are no ocular or oral lesions.  There are no palpable cervical or supraclavicular lymph nodes.  Lungs:  Clear bilaterally.  Cardiac:  Regular rate and rhythm with a normal S1 and S2.  There are no murmurs, rubs, or bruits. Abdomen:  Soft with good bowel sounds.  There is no palpable abdominal mass.  There is no  fluid wave.  No palpable hepatosplenomegaly. Extremities:  Show the chronic changes in her thighs.  She may have some trace edema in her lower legs.  She has decent strength in her legs. Skin:  Shows no rashes.  Neurological:  Shows no focal neurological deficits.  LABORATORY STUDIES:  White cell count 3.9, hemoglobin 8.1, hematocrit 25.7, platelet count is 71.  IMPRESSION:  [name] is a 67 year old white female with kappa light chain myeloma.  This is refractory.  She actually has had this now for over, I think, 20 years.  We will try to call [name] to see if we can move her appointment up.  I told her that they are very busy out there and that, again, she will be okay to wait until April 23rd.  I tolerated her that we cannot treat her with anything as clinical trials usually mandate that no therapy be given to a patient for 4-6 weeks.  We will go ahead and transfuse her with blood.  We will go ahead and give her the IVIG and Zometa.  We will plan to get her back to see [place] probably in a month or so, after she has seen [facility].    ______________________________ Josph Macho, M.D. PRE/MEDQ  D:  01/09/2013  T:  01/10/2013  Job:  [id number]

## 2013-01-17 ENCOUNTER — Other Ambulatory Visit: Payer: Self-pay | Admitting: *Deleted

## 2013-01-17 ENCOUNTER — Other Ambulatory Visit: Payer: Medicare Other | Admitting: Lab

## 2013-01-17 ENCOUNTER — Ambulatory Visit: Payer: Medicare Other

## 2013-01-17 ENCOUNTER — Ambulatory Visit: Payer: Medicare Other | Admitting: Hematology & Oncology

## 2013-01-17 DIAGNOSIS — J069 Acute upper respiratory infection, unspecified: Secondary | ICD-10-CM

## 2013-01-17 DIAGNOSIS — C9000 Multiple myeloma not having achieved remission: Secondary | ICD-10-CM

## 2013-01-17 MED ORDER — AZITHROMYCIN 250 MG PO TABS: ORAL_TABLET | ORAL | Status: DC

## 2013-01-17 MED ORDER — HYDROCODONE-ACETAMINOPHEN 5-325 MG PO TABS: ORAL_TABLET | ORAL | Status: DC

## 2013-01-17 NOTE — Telephone Encounter (Signed)
Pt called with c/o an upper resp tract infection productive of yellow/green mucous when she blows her nose and/or coughs. Denies any fevers. Wants an abx and refill of her Vicodin called in. Reviewed with Dr Myna Hidalgo. To start Z-pack and may refill Vicodin.

## 2013-01-25 ENCOUNTER — Other Ambulatory Visit: Payer: Self-pay | Admitting: Hematology & Oncology

## 2013-01-26 ENCOUNTER — Telehealth: Payer: Self-pay | Admitting: Hematology & Oncology

## 2013-01-26 ENCOUNTER — Ambulatory Visit (HOSPITAL_BASED_OUTPATIENT_CLINIC_OR_DEPARTMENT_OTHER): Payer: Medicare Other | Admitting: Medical

## 2013-01-26 ENCOUNTER — Other Ambulatory Visit: Payer: Self-pay | Admitting: *Deleted

## 2013-01-26 ENCOUNTER — Other Ambulatory Visit (HOSPITAL_BASED_OUTPATIENT_CLINIC_OR_DEPARTMENT_OTHER): Payer: Medicare Other | Admitting: Lab

## 2013-01-26 DIAGNOSIS — D259 Leiomyoma of uterus, unspecified: Secondary | ICD-10-CM

## 2013-01-26 DIAGNOSIS — R229 Localized swelling, mass and lump, unspecified: Secondary | ICD-10-CM

## 2013-01-26 DIAGNOSIS — N289 Disorder of kidney and ureter, unspecified: Secondary | ICD-10-CM

## 2013-01-26 DIAGNOSIS — C9000 Multiple myeloma not having achieved remission: Secondary | ICD-10-CM

## 2013-01-26 DIAGNOSIS — D649 Anemia, unspecified: Secondary | ICD-10-CM

## 2013-01-26 DIAGNOSIS — N189 Chronic kidney disease, unspecified: Secondary | ICD-10-CM

## 2013-01-26 DIAGNOSIS — Z8701 Personal history of pneumonia (recurrent): Secondary | ICD-10-CM

## 2013-01-26 DIAGNOSIS — S40022D Contusion of left upper arm, subsequent encounter: Secondary | ICD-10-CM

## 2013-01-26 DIAGNOSIS — D631 Anemia in chronic kidney disease: Secondary | ICD-10-CM

## 2013-01-26 LAB — CMP (CANCER CENTER ONLY)
Alkaline Phosphatase: 160 U/L — ABNORMAL HIGH (ref 26–84)
BUN, Bld: 25 mg/dL — ABNORMAL HIGH (ref 7–22)
CO2: 27 mEq/L (ref 18–33)
Creat: 1.5 mg/dl — ABNORMAL HIGH (ref 0.6–1.2)
Glucose, Bld: 115 mg/dL (ref 73–118)
Sodium: 141 mEq/L (ref 128–145)
Total Bilirubin: 0.6 mg/dl (ref 0.20–1.60)
Total Protein: 6.8 g/dL (ref 6.4–8.1)

## 2013-01-26 LAB — CBC WITH DIFFERENTIAL (CANCER CENTER ONLY)
BASO#: 0 10*3/uL (ref 0.0–0.2)
Eosinophils Absolute: 0.1 10*3/uL (ref 0.0–0.5)
HCT: 30.5 % — ABNORMAL LOW (ref 34.8–46.6)
HGB: 9.8 g/dL — ABNORMAL LOW (ref 11.6–15.9)
LYMPH%: 14.3 % (ref 14.0–48.0)
MCH: 33.9 pg (ref 26.0–34.0)
MCV: 106 fL — ABNORMAL HIGH (ref 81–101)
MONO#: 0.6 10*3/uL (ref 0.1–0.9)
MONO%: 15.3 % — ABNORMAL HIGH (ref 0.0–13.0)
NEUT%: 67 % (ref 39.6–80.0)
RBC: 2.89 10*6/uL — ABNORMAL LOW (ref 3.70–5.32)
WBC: 4.1 10*3/uL (ref 3.9–10.0)

## 2013-01-26 LAB — TECHNOLOGIST REVIEW CHCC SATELLITE

## 2013-01-26 NOTE — Progress Notes (Signed)
DIAGNOSIS:  Refractory kappa light chain myeloma.  CURRENT THERAPY: 1. The patient had been receiving bendamustine/Velcade/Decadron. 2. IVIG monthly for recurrent pneumonia. 3. Zometa 3.3 mg IV monthly. 4. Aranesp 300 mcg subcu q.3 weeks for hemoglobin less than 10.  INTERIM HISTORY: [name] presents today as a work in..  She, reports, that about a week ago, she was getting out of the shower and drying herself off when she felt a hard mass in her right buttocks.  She's not reported any pain with this.  She thought that it might go away.  She is currently set up to go to [facility] on April, 23rd to be evaluated for a clinical trial.  We are continuing to give her IVIG, and Zometa.  Unfortunately, she really does not have good markers in her blood for an identifying myeloma progression.  Her last myeloma studies that we did on her back in March revealed a, kappa free light chain of 5.37 mg/dL. .  She otherwise states she has a decent, appetite.  She denies any nausea, vomiting, diarrhea, constipation, chest pain, shortness of breath, or cough.  She denies any fevers, chills, or night sweats.  She denies any abdominal pain, any obvious, or abnormal bleeding.  She denies any headaches, visual changes, or rashes.  Of note, we have had to give her supportive blood transfusions.    Review of Systems: Constitutional:Negative for malaise/fatigue, fever, chills, weight loss, diaphoresis, activity change, appetite change, and unexpected weight change.  HEENT: Negative for double vision, blurred vision, visual loss, ear pain, tinnitus, congestion, rhinorrhea, epistaxis sore throat or sinus disease, oral pain/lesion, tongue soreness Respiratory: Negative for cough, chest tightness, shortness of breath, wheezing and stridor.  Cardiovascular: Negative for chest pain, palpitations, leg swelling, orthopnea, PND, DOE or claudication Gastrointestinal: Negative for nausea, vomiting, abdominal pain, diarrhea,  constipation, blood in stool, melena, hematochezia, abdominal distention, anal bleeding, rectal pain, anorexia and hematemesis.  Genitourinary: Negative for dysuria, frequency, hematuria,  Musculoskeletal: Negative for myalgias, back pain, joint swelling, arthralgias and gait problem.  Skin: Negative for rash, color change, pallor and wound.  Neurological:. Negative for dizziness/light-headedness, tremors, seizures, syncope, facial asymmetry, speech difficulty, weakness, numbness, headaches and paresthesias.  Hematological: Negative for adenopathy. Does not bruise/bleed easily.  Psychiatric/Behavioral:  Negative for depression, no loss of interest in normal activity or change in sleep pattern.   Physical Exam: This is a somewhat, petite, 67 year old, well-developed, well-nourished, white female, in no obvious distress Vitals:.  Temperature 98.1 degrees, pulse 87, respirations 18, blood pressure 177, over 50 weight 120 pounds HEENT reveals a normocephalic, atraumatic skull, no scleral icterus, no oral lesions  Neck is supple without any cervical or supraclavicular adenopathy.  Lungs are clear to auscultation bilaterally. There are no wheezes, rales or rhonci Cardiac is regular rate and rhythm with a normal S1 and S2. There are no murmurs, rubs, or bruits.  Abdomen is soft with good bowel sounds, there is no palpable mass. There is no palpable hepatosplenomegaly. There is no palpable fluid wave.  Musculoskeletal no tenderness of the spine, ribs, or hips.  Extremities there are no clubbing, cyanosis, or edema.  Skin no petechia, purpura or ecchymosis Neurologic is nonfocal.  Laboratory Data: L4-1, hemoglobin 9.8, hematocrit 30.5.  Platelets 71,000  Current Outpatient Prescriptions on File Prior to Visit  Medication Sig Dispense Refill  . acyclovir (ZOVIRAX) 400 MG tablet Take 400 mg by mouth 2 (two) times daily.      Marland Kitchen ALPRAZolam (XANAX) 0.25 MG tablet Take  0.25 mg by mouth at bedtime as  needed.       . Alum & Mag Hydroxide-Simeth (MAGIC MOUTHWASH) SOLN Take 5 mLs by mouth as needed ([facility] formula--no Simethicone. For sores in mouth.).       Marland Kitchen azithromycin (ZITHROMAX Z-PAK) 250 MG tablet Take according to package instructions  6 each  0  . benzonatate (TESSALON) 100 MG capsule Take 100 mg by mouth daily as needed. For cough.      . DETROL LA 4 MG 24 hr capsule Take 4 mg by mouth as needed.       . diphenoxylate-atropine (LOMOTIL) 2.5-0.025 MG per tablet TAKE 1 TABLET BY MOUTH 4 TIMES A DAY AS NEEDED FOR DIARRHEA OR LOOSE STOOLS  30 tablet  2  . DULoxetine (CYMBALTA) 30 MG capsule Take 30 mg by mouth every morning.       Marland Kitchen esomeprazole (NEXIUM) 40 MG capsule Take 40 mg by mouth every morning.       Marland Kitchen HYDROcodone-acetaminophen (NORCO/VICODIN) 5-325 MG per tablet TAKE 1 TO 2 TABLETS EVERY 8 HOURS AS NEEDED FOR PAIN  90 tablet  0  . hydrOXYzine (ATARAX/VISTARIL) 25 MG tablet TAKE 1 TABLET BY MOUTH TWICE A DAY AS NEEDED  60 tablet  1  . levothyroxine (SYNTHROID, LEVOTHROID) 50 MCG tablet Take 50 mcg by mouth every morning.       Marland Kitchen LORazepam (ATIVAN) 0.5 MG tablet Take 1 tablet (0.5 mg total) by mouth every 8 (eight) hours as needed. For anxiety.  30 tablet  0  . naproxen sodium (ALEVE) 220 MG tablet Take 440 mg by mouth every 8 (eight) hours as needed (For pain.).      Marland Kitchen nystatin (MYCOSTATIN) 100000 UNIT/ML suspension Take 500,000 Units by mouth 4 (four) times daily as needed. For sores in mouth.      Marland Kitchen olopatadine (PATANOL) 0.1 % ophthalmic solution Place 1 drop into both eyes 2 (two) times daily as needed (For allergies.).       Marland Kitchen ondansetron (ZOFRAN) 8 MG tablet Take 1 tablet (8 mg total) by mouth every 8 (eight) hours as needed for nausea. For nausea.  20 tablet  0  . pirbuterol (MAXAIR) 200 MCG/INH inhaler Inhale 1-2 puffs into the lungs every 12 (twelve) hours as needed. For wheezing or shortness of breath.      . promethazine (PHENERGAN) 25 MG tablet Take 0.5 tablets (12.5 mg  total) by mouth every 6 (six) hours as needed (For nausea or vomiting.).  30 tablet  0  . temazepam (RESTORIL) 30 MG capsule TAKE ONE CAPSULE BY MOUTH AT BEDTIME AS NEEDED FOR SLEEP  30 capsule  2  . Vitamin D, Ergocalciferol, (DRISDOL) 50000 UNITS CAPS Take 50,000 Units by mouth every Monday.      . [DISCONTINUED] potassium chloride (KLOR-CON) 10 MEQ CR tablet Take 10 mEq by mouth daily.         No current facility-administered medications on file prior to visit.   Assessment/Plan: This is a pleasant, 67 year old, white female, with the following issues:  #1.  New mass of the right buttocks.  We will go ahead and set her up with the CT scan of the pelvis.  Her creatinine was a little bit elevated, as such, we will not use contrast.  Hopefully, this is not a sarcoma.  #2.  Kappa light chain myeloma.  This is refractory.  She does have an appointment down at, [facility] on April, 23rd.  She does continue on IVIG, and  Zometa.  #3.  Followup.  We will follow back up with [name] on 02/15/2013, but before then should there be questions or concerns.

## 2013-01-26 NOTE — Telephone Encounter (Signed)
Pt aware of 4-14 CT

## 2013-01-27 ENCOUNTER — Other Ambulatory Visit: Payer: Self-pay | Admitting: Hematology & Oncology

## 2013-01-29 ENCOUNTER — Other Ambulatory Visit: Payer: Self-pay | Admitting: *Deleted

## 2013-01-29 ENCOUNTER — Ambulatory Visit (HOSPITAL_BASED_OUTPATIENT_CLINIC_OR_DEPARTMENT_OTHER)
Admission: RE | Admit: 2013-01-29 | Discharge: 2013-01-29 | Disposition: A | Discharge status: Home / Self Care | Payer: Medicare Other | Source: Ambulatory Visit | Attending: Hematology & Oncology | Admitting: Hematology & Oncology

## 2013-01-29 ENCOUNTER — Ambulatory Visit (HOSPITAL_BASED_OUTPATIENT_CLINIC_OR_DEPARTMENT_OTHER)
Admission: RE | Admit: 2013-01-29 | Discharge: 2013-01-29 | Disposition: A | Discharge status: Home / Self Care | Payer: Medicare Other | Source: Ambulatory Visit | Attending: Medical | Admitting: Medical

## 2013-01-29 ENCOUNTER — Other Ambulatory Visit: Payer: Self-pay | Admitting: Hematology & Oncology

## 2013-01-29 DIAGNOSIS — M25519 Pain in unspecified shoulder: Secondary | ICD-10-CM | POA: Insufficient documentation

## 2013-01-29 DIAGNOSIS — J4489 Other specified chronic obstructive pulmonary disease: Secondary | ICD-10-CM | POA: Insufficient documentation

## 2013-01-29 DIAGNOSIS — J449 Chronic obstructive pulmonary disease, unspecified: Secondary | ICD-10-CM | POA: Insufficient documentation

## 2013-01-29 DIAGNOSIS — I868 Varicose veins of other specified sites: Secondary | ICD-10-CM | POA: Insufficient documentation

## 2013-01-29 DIAGNOSIS — M799 Soft tissue disorder, unspecified: Secondary | ICD-10-CM | POA: Insufficient documentation

## 2013-01-29 DIAGNOSIS — Z87898 Personal history of other specified conditions: Secondary | ICD-10-CM | POA: Insufficient documentation

## 2013-01-29 DIAGNOSIS — M25511 Pain in right shoulder: Secondary | ICD-10-CM

## 2013-01-29 DIAGNOSIS — M8448XA Pathological fracture, other site, initial encounter for fracture: Secondary | ICD-10-CM | POA: Insufficient documentation

## 2013-01-29 DIAGNOSIS — M810 Age-related osteoporosis without current pathological fracture: Secondary | ICD-10-CM | POA: Insufficient documentation

## 2013-01-29 DIAGNOSIS — N289 Disorder of kidney and ureter, unspecified: Secondary | ICD-10-CM | POA: Insufficient documentation

## 2013-01-29 DIAGNOSIS — M899 Disorder of bone, unspecified: Secondary | ICD-10-CM | POA: Insufficient documentation

## 2013-01-29 DIAGNOSIS — C9000 Multiple myeloma not having achieved remission: Secondary | ICD-10-CM

## 2013-01-29 DIAGNOSIS — D259 Leiomyoma of uterus, unspecified: Secondary | ICD-10-CM

## 2013-01-29 NOTE — Progress Notes (Signed)
Pt called with c/o right shoulder pain that started yesterday. She is unable to lift her arm high enough to get a coffee cup out of the cabinet. Wants to know if she can have an x-ray while she is here for her CT Scan. Reviewed with Dr Myna Hidalgo. Ok to have an x-ray done. Will place order in EMR.

## 2013-01-30 ENCOUNTER — Other Ambulatory Visit: Payer: Self-pay | Admitting: *Deleted

## 2013-02-01 ENCOUNTER — Other Ambulatory Visit: Payer: Self-pay | Admitting: *Deleted

## 2013-02-01 ENCOUNTER — Telehealth: Payer: Self-pay | Admitting: Internal Medicine

## 2013-02-01 DIAGNOSIS — C9000 Multiple myeloma not having achieved remission: Secondary | ICD-10-CM

## 2013-02-01 DIAGNOSIS — M25511 Pain in right shoulder: Secondary | ICD-10-CM

## 2013-02-01 MED ORDER — VICODIN 5-300 MG PO TABS: 1.0000 | ORAL_TABLET | Freq: Three times a day (TID) | ORAL | Status: DC | PRN

## 2013-02-01 NOTE — Telephone Encounter (Signed)
Pt called wanting to know the results of her CT and X-ray. Reviewed the results with her but asked if she remembered the conversation from the other day and she wasn't real sure. Explained that Dr Myna Hidalgo was waiting on a return call from the ortho MD (Dr. Yisroel Ramming) regarding her shoulder but that her CT Scan looked like an old injury "statistically, this is likely to reflect a post-traumatic finding". While the pt was on the phone, Dr Myna Hidalgo said he spoke with Dr Yisroel Ramming who believes she does have a stress fracture of her scapula. His office should be giving her a call with an appt. Will change pain med to Brand Name Vicodin.

## 2013-02-01 NOTE — Telephone Encounter (Signed)
Caller: Kelly/Other; Phone: [phone]; Reason for Call: Kelly-Physical Therapist with [facility] is notifying the office that patient stated she was too busy this week to participate in her physical therapy.  Additionally, she is having shoulder issues and will be going to see an Orthopedist regarding this--also wanted to hold off until she knew what was going on with her shoulders.  No return call is needed.

## 2013-02-01 NOTE — Telephone Encounter (Signed)
FYI: Patient has yet to make and/or keep appointment with PCP office since last visit 11.06.13; was due back in [4] mth [03.2014]/SLS

## 2013-02-02 ENCOUNTER — Other Ambulatory Visit: Payer: Self-pay | Admitting: *Deleted

## 2013-02-02 DIAGNOSIS — M25511 Pain in right shoulder: Secondary | ICD-10-CM

## 2013-02-02 DIAGNOSIS — C9000 Multiple myeloma not having achieved remission: Secondary | ICD-10-CM

## 2013-02-03 ENCOUNTER — Other Ambulatory Visit: Payer: Self-pay | Admitting: Hematology & Oncology

## 2013-02-05 ENCOUNTER — Telehealth: Payer: Self-pay | Admitting: Hematology & Oncology

## 2013-02-05 NOTE — Telephone Encounter (Signed)
Pt aware of 4-28 appointment with Dr. Jerl Santos

## 2013-02-07 DIAGNOSIS — C9000 Multiple myeloma not having achieved remission: Secondary | ICD-10-CM | POA: Insufficient documentation

## 2013-02-08 ENCOUNTER — Ambulatory Visit: Payer: Medicare Other

## 2013-02-08 ENCOUNTER — Other Ambulatory Visit: Payer: Medicare Other | Admitting: Lab

## 2013-02-08 ENCOUNTER — Ambulatory Visit: Payer: Medicare Other | Admitting: Hematology & Oncology

## 2013-02-08 DIAGNOSIS — Z90710 Acquired absence of both cervix and uterus: Secondary | ICD-10-CM | POA: Insufficient documentation

## 2013-02-08 DIAGNOSIS — H269 Unspecified cataract: Secondary | ICD-10-CM | POA: Insufficient documentation

## 2013-02-08 DIAGNOSIS — Z9481 Bone marrow transplant status: Secondary | ICD-10-CM | POA: Insufficient documentation

## 2013-02-08 DIAGNOSIS — Z9089 Acquired absence of other organs: Secondary | ICD-10-CM | POA: Insufficient documentation

## 2013-02-08 DIAGNOSIS — M84453A Pathological fracture, unspecified femur, initial encounter for fracture: Secondary | ICD-10-CM | POA: Insufficient documentation

## 2013-02-08 DIAGNOSIS — D801 Nonfamilial hypogammaglobulinemia: Secondary | ICD-10-CM | POA: Insufficient documentation

## 2013-02-08 DIAGNOSIS — Z9889 Other specified postprocedural states: Secondary | ICD-10-CM | POA: Insufficient documentation

## 2013-02-08 DIAGNOSIS — K439 Ventral hernia without obstruction or gangrene: Secondary | ICD-10-CM | POA: Insufficient documentation

## 2013-02-12 ENCOUNTER — Telehealth: Payer: Self-pay

## 2013-02-12 NOTE — Telephone Encounter (Signed)
OK to continue Home PT with [facility]

## 2013-02-12 NOTE — Telephone Encounter (Signed)
[name] w/[facility] stated in message that he is requesting a continuation w/home health PT. [name] just needs a verbal ok and a vm can be left for [place] at [phone]

## 2013-02-12 NOTE — Telephone Encounter (Signed)
Called and left message on Mr. [name] voicemail in regards to patient per Dr. Abner Greenspan it is ok for patient to continue Home PT with [facility]

## 2013-02-15 ENCOUNTER — Telehealth: Payer: Self-pay | Admitting: Oncology

## 2013-02-15 ENCOUNTER — Other Ambulatory Visit (HOSPITAL_BASED_OUTPATIENT_CLINIC_OR_DEPARTMENT_OTHER): Payer: Medicare Other | Admitting: Lab

## 2013-02-15 ENCOUNTER — Ambulatory Visit (HOSPITAL_BASED_OUTPATIENT_CLINIC_OR_DEPARTMENT_OTHER): Payer: Medicare Other | Admitting: Hematology & Oncology

## 2013-02-15 ENCOUNTER — Ambulatory Visit (HOSPITAL_BASED_OUTPATIENT_CLINIC_OR_DEPARTMENT_OTHER): Payer: Medicare Other

## 2013-02-15 VITALS — BP 114/47 | HR 83 | Temp 98.1°F | Resp 16 | Ht 59.0 in | Wt 118.0 lb

## 2013-02-15 VITALS — BP 128/67 | HR 77 | Temp 97.0°F

## 2013-02-15 DIAGNOSIS — D649 Anemia, unspecified: Secondary | ICD-10-CM

## 2013-02-15 DIAGNOSIS — C9000 Multiple myeloma not having achieved remission: Secondary | ICD-10-CM

## 2013-02-15 DIAGNOSIS — R197 Diarrhea, unspecified: Secondary | ICD-10-CM

## 2013-02-15 DIAGNOSIS — N289 Disorder of kidney and ureter, unspecified: Secondary | ICD-10-CM

## 2013-02-15 DIAGNOSIS — N189 Chronic kidney disease, unspecified: Secondary | ICD-10-CM

## 2013-02-15 LAB — CBC WITH DIFFERENTIAL (CANCER CENTER ONLY)
BASO#: 0 10*3/uL (ref 0.0–0.2)
Eosinophils Absolute: 0.2 10*3/uL (ref 0.0–0.5)
HGB: 8.8 g/dL — ABNORMAL LOW (ref 11.6–15.9)
MCH: 34.8 pg — ABNORMAL HIGH (ref 26.0–34.0)
MONO%: 18.3 % — ABNORMAL HIGH (ref 0.0–13.0)
NEUT#: 2.4 10*3/uL (ref 1.5–6.5)
RBC: 2.53 10*6/uL — ABNORMAL LOW (ref 3.70–5.32)

## 2013-02-15 MED ORDER — SODIUM CHLORIDE 0.9 % IV SOLN
Freq: Once | INTRAVENOUS | Status: AC
  Administered 2013-02-15: 12:00:00 via INTRAVENOUS

## 2013-02-15 MED ORDER — HEPARIN SOD (PORK) LOCK FLUSH 100 UNIT/ML IV SOLN
500.0000 [IU] | Freq: Once | INTRAVENOUS | Status: AC
  Administered 2013-02-15: 500 [IU] via INTRAVENOUS
  Filled 2013-02-15: qty 5

## 2013-02-15 MED ORDER — DARBEPOETIN ALFA-POLYSORBATE 300 MCG/0.6ML IJ SOLN
300.0000 ug | Freq: Once | INTRAMUSCULAR | Status: AC
  Administered 2013-02-15: 300 ug via SUBCUTANEOUS

## 2013-02-15 MED ORDER — SODIUM CHLORIDE 0.9 % IJ SOLN
10.0000 mL | INTRAMUSCULAR | Status: DC | PRN
  Administered 2013-02-15: 10 mL via INTRAVENOUS
  Filled 2013-02-15: qty 10

## 2013-02-15 MED ORDER — DIPHENHYDRAMINE HCL 25 MG PO CAPS
25.0000 mg | ORAL_CAPSULE | Freq: Once | ORAL | Status: AC
  Administered 2013-02-15: 25 mg via ORAL

## 2013-02-15 MED ORDER — DEXAMETHASONE SODIUM PHOSPHATE 20 MG/5ML IJ SOLN
40.0000 mg | Freq: Once | INTRAMUSCULAR | Status: AC
  Administered 2013-02-15: 40 mg via INTRAVENOUS
  Filled 2013-02-15: qty 10

## 2013-02-15 MED ORDER — LORAZEPAM 0.5 MG PO TABS: 0.5000 mg | ORAL_TABLET | Freq: Three times a day (TID) | ORAL | Status: DC

## 2013-02-15 MED ORDER — IMMUNE GLOBULIN (HUMAN) 20 GM/200ML IV SOLN
0.7000 g/kg | Freq: Once | INTRAVENOUS | Status: AC
  Administered 2013-02-15: 40 g via INTRAVENOUS
  Filled 2013-02-15: qty 400

## 2013-02-15 MED ORDER — ACETAMINOPHEN 325 MG PO TABS
650.0000 mg | ORAL_TABLET | Freq: Once | ORAL | Status: AC
  Administered 2013-02-15: 650 mg via ORAL

## 2013-02-15 MED ORDER — DEXAMETHASONE 4 MG PO TABS: 40.0000 mg | ORAL_TABLET | Freq: Once | ORAL | Status: DC

## 2013-02-15 MED ORDER — LORAZEPAM 2 MG/ML IJ SOLN
0.5000 mg | Freq: Once | INTRAMUSCULAR | Status: AC
  Administered 2013-02-15: 0.5 mg via INTRAVENOUS

## 2013-02-15 MED ORDER — ZOLEDRONIC ACID 4 MG/5ML IV CONC
3.3000 mg | Freq: Once | INTRAVENOUS | Status: AC
  Administered 2013-02-15: 3.3 mg via INTRAVENOUS
  Filled 2013-02-15: qty 4.13

## 2013-02-15 NOTE — Patient Instructions (Addendum)
Darbepoetin Alfa injection What is this medicine? DARBEPOETIN ALFA (dar be POE e tin AL fa) helps your body make more red blood cells. It is used to treat anemia caused by chronic kidney failure and chemotherapy. This medicine may be used for other purposes; ask your health care provider or pharmacist if you have questions. What should I tell my health care provider before I take this medicine? They need to know if you have any of these conditions: -blood clotting disorders or history of blood clots -cancer patient not on chemotherapy -cystic fibrosis -heart disease, such as angina, heart failure, or a history of a heart attack -hemoglobin level of 12 g/dL or greater -high blood pressure -low levels of folate, iron, or vitamin B12 -seizures -an unusual or allergic reaction to darbepoetin, erythropoietin, albumin, hamster proteins, latex, other medicines, foods, dyes, or preservatives -pregnant or trying to get pregnant -breast-feeding How should I use this medicine? This medicine is for injection into a vein or under the skin. It is usually given by a health care professional in a hospital or clinic setting. If you get this medicine at home, you will be taught how to prepare and give this medicine. Do not shake the solution before you withdraw a dose. Use exactly as directed. Take your medicine at regular intervals. Do not take your medicine more often than directed. It is important that you put your used needles and syringes in a special sharps container. Do not put them in a trash can. If you do not have a sharps container, call your pharmacist or healthcare provider to get one. Talk to your pediatrician regarding the use of this medicine in children. While this medicine may be used in children as young as 1 year for selected conditions, precautions do apply. Overdosage: If you think you have taken too much of this medicine contact a poison control center or emergency room at once. NOTE:  This medicine is only for you. Do not share this medicine with others. What if I miss a dose? If you miss a dose, take it as soon as you can. If it is almost time for your next dose, take only that dose. Do not take double or extra doses. What may interact with this medicine? Do not take this medicine with any of the following medications: -epoetin alfa This list may not describe all possible interactions. Give your health care provider a list of all the medicines, herbs, non-prescription drugs, or dietary supplements you use. Also tell them if you smoke, drink alcohol, or use illegal drugs. Some items may interact with your medicine. What should I watch for while using this medicine? Visit your prescriber or health care professional for regular checks on your progress and for the needed blood tests and blood pressure measurements. It is especially important for the doctor to make sure your hemoglobin level is in the desired range, to limit the risk of potential side effects and to give you the best benefit. Keep all appointments for any recommended tests. Check your blood pressure as directed. Ask your doctor what your blood pressure should be and when you should contact him or her. As your body makes more red blood cells, you may need to take iron, folic acid, or vitamin B supplements. Ask your doctor or health care provider which products are right for you. If you have kidney disease continue dietary restrictions, even though this medication can make you feel better. Talk with your doctor or health care professional about  the foods you eat and the vitamins that you take. What side effects may I notice from receiving this medicine? Side effects that you should report to your doctor or health care professional as soon as possible: -allergic reactions like skin rash, itching or hives, swelling of the face, lips, or tongue -breathing problems -changes in vision -chest pain -confusion, trouble speaking  or understanding -feeling faint or lightheaded, falls -high blood pressure -muscle aches or pains -pain, swelling, warmth in the leg -rapid weight gain -severe headaches -sudden numbness or weakness of the face, arm or leg -trouble walking, dizziness, loss of balance or coordination -seizures (convulsions) -swelling of the ankles, feet, hands -unusually weak or tired Side effects that usually do not require medical attention (report to your doctor or health care professional if they continue or are bothersome): -diarrhea -fever, chills (flu-like symptoms) -headaches -nausea, vomiting -redness, stinging, or swelling at site where injected This list may not describe all possible side effects. Call your doctor for medical advice about side effects. You may report side effects to FDA at 1-800-FDA-1088. Where should I keep my medicine? Keep out of the reach of children. Store in a refrigerator between 2 and 8 degrees C (36 and 46 degrees F). Do not freeze. Do not shake. Throw away any unused portion if using a single-dose vial. Throw away any unused medicine after the expiration date. NOTE: This sheet is a summary. It may not cover all possible information. If you have questions about this medicine, talk to your doctor, pharmacist, or health care provider.  2012, Elsevier/Gold Standard. (09/17/2008 10:23:57 AM)Immune Globulin Injection What is this medicine? IMMUNE GLOBULIN (im MUNE GLOB yoo lin) helps to prevent or reduce the severity of certain infections in patients who are at risk. This medicine is collected from the pooled blood of many donors. It is used to treat immune system problems, thrombocytopenia, and Kawasaki syndrome. This medicine may be used for other purposes; ask your health care provider or pharmacist if you have questions. What should I tell my health care provider before I take this medicine? They need to know if you have any of these  conditions: - diabetes - extremely low or no immune antibodies in the blood - heart disease - history of blood clots - hyperprolinemia - infection in the blood, sepsis - kidney disease - taking medicine that may change kidney function - ask your health care provider about your medicine - an unusual or allergic reaction to human immune globulin, albumin, maltose, sucrose, polysorbate 80, other medicines, foods, dyes, or preservatives - pregnant or trying to get pregnant - breast-feeding How should I use this medicine? This medicine is for injection into a muscle or infusion into a vein or skin. It is usually given by a health care professional in a hospital or clinic setting. In rare cases, some brands of this medicine might be given at home. You will be taught how to give this medicine. Use exactly as directed. Take your medicine at regular intervals. Do not take your medicine more often than directed. Talk to your pediatrician regarding the use of this medicine in children. Special care may be needed. Overdosage: If you think you have taken too much of this medicine contact a poison control center or emergency room at once. NOTE: This medicine is only for you. Do not share this medicine with others. What if I miss a dose? It is important not to miss your dose. Call your doctor or health care professional if you  are unable to keep an appointment. If you give yourself the medicine and you miss a dose, take it as soon as you can. If it is almost time for your next dose, take only that dose. Do not take double or extra doses. What may interact with this medicine? -aspirin and aspirin-like medicines -cisplatin -cyclosporine -medicines for infection like acyclovir, adefovir, amphotericin B, bacitracin, cidofovir, foscarnet, ganciclovir, gentamicin, pentamidine, vancomycin -NSAIDS, medicines for pain and inflammation, like ibuprofen or naproxen -pamidronate -vaccines -zoledronic  acid This list may not describe all possible interactions. Give your health care provider a list of all the medicines, herbs, non-prescription drugs, or dietary supplements you use. Also tell them if you smoke, drink alcohol, or use illegal drugs. Some items may interact with your medicine. What should I watch for while using this medicine? Your condition will be monitored carefully while you are receiving this medicine. This medicine is made from pooled blood donations of many different people. It may be possible to pass an infection in this medicine. However, the donors are screened for infections and all products are tested for HIV and hepatitis. The medicine is treated to kill most or all bacteria and viruses. Talk to your doctor about the risks and benefits of this medicine. Do not have vaccinations for at least 14 days before, or until at least 3 months after receiving this medicine. What side effects may I notice from receiving this medicine? Side effects that you should report to your doctor or health care professional as soon as possible: -allergic reactions like skin rash, itching or hives, swelling of the face, lips, or tongue -breathing problems -chest pain or tightness -fever, chills -headache with nausea, vomiting -neck pain or difficulty moving neck -pain when moving eyes -pain, swelling, warmth in the leg -problems with balance, talking, walking -sudden weight gain -swelling of the ankles, feet, hands -trouble passing urine or change in the amount of urine Side effects that usually do not require medical attention (report to your doctor or health care professional if they continue or are bothersome): -dizzy, drowsy -flushing -increased sweating -leg cramps -muscle aches and pains -pain at site where injected This list may not describe all possible side effects. Call your doctor for medical advice about side effects. You may report side effects to FDA at  1-800-FDA-1088. Where should I keep my medicine? Keep out of the reach of children. This drug is usually given in a hospital or clinic and will not be stored at home. In rare cases, some brands of this medicine may be given at home. If you are using this medicine at home, you will be instructed on how to store this medicine. Throw away any unused medicine after the expiration date on the label. NOTE: This sheet is a summary. It may not cover all possible information. If you have questions about this medicine, talk to your doctor, pharmacist, or health care provider.  2012, Elsevier/Gold Standard. (12/25/2008 11:44:49 AM)IZoledronic Acid injection (Hypercalcemia, Oncology) What is this medicine? ZOLEDRONIC ACID (ZOE le dron ik AS id) lowers the amount of calcium loss from bone. It is used to treat too much calcium in your blood from cancer. It is also used to prevent complications of cancer that has spread to the bone. This medicine may be used for other purposes; ask your health care provider or pharmacist if you have questions. What should I tell my health care provider before I take this medicine? They need to know if you have any of these  conditions: -aspirin-sensitive asthma -dental disease -kidney disease -an unusual or allergic reaction to zoledronic acid, other medicines, foods, dyes, or preservatives -pregnant or trying to get pregnant -breast-feeding How should I use this medicine? This medicine is for infusion into a vein. It is given by a health care professional in a hospital or clinic setting. Talk to your pediatrician regarding the use of this medicine in children. Special care may be needed. Overdosage: If you think you have taken too much of this medicine contact a poison control center or emergency room at once. NOTE: This medicine is only for you. Do not share this medicine with others. What if I miss a dose? It is important not to miss your dose. Call your doctor or health  care professional if you are unable to keep an appointment. What may interact with this medicine? -certain antibiotics given by injection -NSAIDs, medicines for pain and inflammation, like ibuprofen or naproxen -some diuretics like bumetanide, furosemide -teriparatide -thalidomide This list may not describe all possible interactions. Give your health care provider a list of all the medicines, herbs, non-prescription drugs, or dietary supplements you use. Also tell them if you smoke, drink alcohol, or use illegal drugs. Some items may interact with your medicine. What should I watch for while using this medicine? Visit your doctor or health care professional for regular checkups. It may be some time before you see the benefit from this medicine. Do not stop taking your medicine unless your doctor tells you to. Your doctor may order blood tests or other tests to see how you are doing. Women should inform their doctor if they wish to become pregnant or think they might be pregnant. There is a potential for serious side effects to an unborn child. Talk to your health care professional or pharmacist for more information. You should make sure that you get enough calcium and vitamin D while you are taking this medicine. Discuss the foods you eat and the vitamins you take with your health care professional. Some people who take this medicine have severe bone, joint, and/or muscle pain. This medicine may also increase your risk for a broken thigh bone. Tell your doctor right away if you have pain in your upper leg or groin. Tell your doctor if you have any pain that does not go away or that gets worse. What side effects may I notice from receiving this medicine? Side effects that you should report to your doctor or health care professional as soon as possible: -allergic reactions like skin rash, itching or hives, swelling of the face, lips, or tongue -anxiety, confusion, or depression -breathing  problems -changes in vision -feeling faint or lightheaded, falls -jaw burning, cramping, pain -muscle cramps, stiffness, or weakness -trouble passing urine or change in the amount of urine Side effects that usually do not require medical attention (report to your doctor or health care professional if they continue or are bothersome): -bone, joint, or muscle pain -fever -hair loss -irritation at site where injected -loss of appetite -nausea, vomiting -stomach upset -tired This list may not describe all possible side effects. Call your doctor for medical advice about side effects. You may report side effects to FDA at 1-800-FDA-1088. Where should I keep my medicine? This drug is given in a hospital or clinic and will not be stored at home. NOTE: This sheet is a summary. It may not cover all possible information. If you have questions about this medicine, talk to your doctor, pharmacist, or health care provider.  2012, Elsevier/Gold Standard. (04/02/2011 9:06:58 AM)mmune Globulin Injection What is this medicine? IMMUNE GLOBULIN (im MUNE GLOB yoo lin) helps to prevent or reduce the severity of certain infections in patients who are at risk. This medicine is collected from the pooled blood of many donors. It is used to treat immune system problems, thrombocytopenia, and Kawasaki syndrome. This medicine may be used for other purposes; ask your health care provider or pharmacist if you have questions. What should I tell my health care provider before I take this medicine? They need to know if you have any of these conditions: - diabetes - extremely low or no immune antibodies in the blood - heart disease - history of blood clots - hyperprolinemia - infection in the blood, sepsis - kidney disease - taking medicine that may change kidney function - ask your health care provider about your medicine - an unusual or allergic reaction to human immune globulin, albumin, maltose, sucrose,  polysorbate 80, other medicines, foods, dyes, or preservatives - pregnant or trying to get pregnant - breast-feeding How should I use this medicine? This medicine is for injection into a muscle or infusion into a vein or skin. It is usually given by a health care professional in a hospital or clinic setting. In rare cases, some brands of this medicine might be given at home. You will be taught how to give this medicine. Use exactly as directed. Take your medicine at regular intervals. Do not take your medicine more often than directed. Talk to your pediatrician regarding the use of this medicine in children. Special care may be needed. Overdosage: If you think you have taken too much of this medicine contact a poison control center or emergency room at once. NOTE: This medicine is only for you. Do not share this medicine with others. What if I miss a dose? It is important not to miss your dose. Call your doctor or health care professional if you are unable to keep an appointment. If you give yourself the medicine and you miss a dose, take it as soon as you can. If it is almost time for your next dose, take only that dose. Do not take double or extra doses. What may interact with this medicine? -aspirin and aspirin-like medicines -cisplatin -cyclosporine -medicines for infection like acyclovir, adefovir, amphotericin B, bacitracin, cidofovir, foscarnet, ganciclovir, gentamicin, pentamidine, vancomycin -NSAIDS, medicines for pain and inflammation, like ibuprofen or naproxen -pamidronate -vaccines -zoledronic acid This list may not describe all possible interactions. Give your health care provider a list of all the medicines, herbs, non-prescription drugs, or dietary supplements you use. Also tell them if you smoke, drink alcohol, or use illegal drugs. Some items may interact with your medicine. What should I watch for while using this medicine? Your condition will be monitored carefully while  you are receiving this medicine. This medicine is made from pooled blood donations of many different people. It may be possible to pass an infection in this medicine. However, the donors are screened for infections and all products are tested for HIV and hepatitis. The medicine is treated to kill most or all bacteria and viruses. Talk to your doctor about the risks and benefits of this medicine. Do not have vaccinations for at least 14 days before, or until at least 3 months after receiving this medicine. What side effects may I notice from receiving this medicine? Side effects that you should report to your doctor or health care professional as soon as possible: -allergic reactions like skin  rash, itching or hives, swelling of the face, lips, or tongue -breathing problems -chest pain or tightness -fever, chills -headache with nausea, vomiting -neck pain or difficulty moving neck -pain when moving eyes -pain, swelling, warmth in the leg -problems with balance, talking, walking -sudden weight gain -swelling of the ankles, feet, hands -trouble passing urine or change in the amount of urine Side effects that usually do not require medical attention (report to your doctor or health care professional if they continue or are bothersome): -dizzy, drowsy -flushing -increased sweating -leg cramps -muscle aches and pains -pain at site where injected This list may not describe all possible side effects. Call your doctor for medical advice about side effects. You may report side effects to FDA at 1-800-FDA-1088. Where should I keep my medicine? Keep out of the reach of children. This drug is usually given in a hospital or clinic and will not be stored at home. In rare cases, some brands of this medicine may be given at home. If you are using this medicine at home, you will be instructed on how to store this medicine. Throw away any unused medicine after the expiration date on the label. NOTE: This  sheet is a summary. It may not cover all possible information. If you have questions about this medicine, talk to your doctor, pharmacist, or health care provider.  2012, Elsevier/Gold Standard. (12/25/2008 11:44:49 AM)

## 2013-02-15 NOTE — Progress Notes (Signed)
This office note has been dictated.

## 2013-02-15 NOTE — Telephone Encounter (Signed)
Pt moved 5-30 to 6-3

## 2013-02-16 ENCOUNTER — Other Ambulatory Visit: Payer: Self-pay | Admitting: Hematology & Oncology

## 2013-02-16 ENCOUNTER — Telehealth: Payer: Self-pay | Admitting: Hematology & Oncology

## 2013-02-16 ENCOUNTER — Encounter: Payer: Self-pay | Admitting: *Deleted

## 2013-02-16 DIAGNOSIS — C9000 Multiple myeloma not having achieved remission: Secondary | ICD-10-CM

## 2013-02-16 NOTE — Progress Notes (Signed)
Obtained consult note from Dr Marissa Calamity from [facility] for Dr Myna Hidalgo to review.

## 2013-02-16 NOTE — Progress Notes (Signed)
CC:   Stacie Sorrow, MD  DIAGNOSIS:  Refractory kappa light chain myeloma.  CURRENT THERAPY: 1. The patient to consider a clinical trial at [facility]. 2. IVIG monthly. 3. Aranesp 300 mcg subcu as needed for hemoglobin less than 10. 4. Zometa 3.3 mg IV monthly.  INTERIM HISTORY:  [name] comes in for followup.  She did see one of the specialists at [facility].  She saw Dr. Marissa Calamity.  He is trying to figure out what clinical trial she might be appropriate for. It is certainly not easy with [name], given all the treatment that she has had and the fact that it is very difficult to monitor her myeloma through her blood as she pretty much does not make light chain or heavy chain that we can detect.  We have found this in the marrow. She does have a little bit of light chain in her serum that could sometimes help.  When she was last seen in March, her kappa light chain was 5.37 mg/dL. Her IgA and IgM levels were markedly depressed.  She has had no problems with bleeding.  She has had no nausea or vomiting.  Given the fact that she has been off therapy really has helped her out from my point of view.  She has had no cough.  She has had no fevers, sweats, or chills.  Pain has not been a problem.  PHYSICAL EXAMINATION:  General:  This is a fairly well-developed, well- nourished white female in no obvious distress.  Vital signs: Temperature of 98.1, pulse 83, respiratory rate 16, blood pressure 114/47.  Weight is 118.  Head and neck:  Normocephalic, atraumatic skull.  There are no ocular or oral lesions.  There are no palpable cervical or supraclavicular lymph nodes.  Lungs:  Clear bilaterally. Cardiac:  Regular rate and rhythm with a normal S1 and S2.  There are no murmurs, rubs, or bruits.  Abdomen:  Soft with good bowel sounds.  There is no palpable abdominal mass.  There is no fluid wave.  There is no palpable hepatosplenomegaly.  Extremities:  Do show the  firmness of her right thigh.  This is chronic.  She has minimal non-pitting edema of her lower legs.  Neurological:  No focal neurological deficits.  LABORATORY STUDIES:  White cell count is 4, hemoglobin 8.8, hematocrit 27.6, platelet count 83.  IMPRESSION:  [name] is a very nice 67 year old white female with refractory kappa light chain myeloma.  She was diagnosed with myeloma about 24 years ago.  She has done incredibly well with this.  She is trying to get into a clinical trial at [facility].  Hopefully, one will be found.  I think that if she does not get onto a trial at [facility], then I might consider treating her with low-dose Cytoxan orally.  This might give [place] some benefit.  We will go ahead and give her IVIG today.  We will give her a dose of Aranesp.  She will also get Zometa.  I do want to give her a dose of Decadron.  I am just glad that she is doing as well as she is.  Her daughter gave birth to her second son on the 21st.  He was 5 weeks early, but yet weighed 6 pounds.  I will plan to get [name] back to see [place] in about 3 or 4 weeks, depending on her situation with the [facility].    ______________________________ Josph Macho,  M.D. PRE/MEDQ  D:  02/15/2013  T:  02/16/2013  Job:  [id number]

## 2013-02-16 NOTE — Telephone Encounter (Signed)
Pt aware of 5-7 and 8 chemo and 5-9 PET at 10 am and to be NPO 6 hrs prior. She will ask for schedule for the rest of May when she comes in on the 7th.

## 2013-02-19 ENCOUNTER — Other Ambulatory Visit: Payer: Self-pay | Admitting: Hematology & Oncology

## 2013-02-21 ENCOUNTER — Other Ambulatory Visit (HOSPITAL_BASED_OUTPATIENT_CLINIC_OR_DEPARTMENT_OTHER): Payer: Medicare Other | Admitting: Lab

## 2013-02-21 ENCOUNTER — Ambulatory Visit (HOSPITAL_BASED_OUTPATIENT_CLINIC_OR_DEPARTMENT_OTHER): Payer: Medicare Other

## 2013-02-21 VITALS — BP 93/81 | HR 88 | Temp 97.0°F

## 2013-02-21 DIAGNOSIS — C9000 Multiple myeloma not having achieved remission: Secondary | ICD-10-CM

## 2013-02-21 DIAGNOSIS — Z452 Encounter for adjustment and management of vascular access device: Secondary | ICD-10-CM

## 2013-02-21 DIAGNOSIS — Z5111 Encounter for antineoplastic chemotherapy: Secondary | ICD-10-CM

## 2013-02-21 DIAGNOSIS — Z5112 Encounter for antineoplastic immunotherapy: Secondary | ICD-10-CM

## 2013-02-21 LAB — CMP (CANCER CENTER ONLY)
ALT(SGPT): 84 U/L — ABNORMAL HIGH (ref 10–47)
BUN, Bld: 21 mg/dL (ref 7–22)
CO2: 28 mEq/L (ref 18–33)
Creat: 1.5 mg/dl — ABNORMAL HIGH (ref 0.6–1.2)
Glucose, Bld: 119 mg/dL — ABNORMAL HIGH (ref 73–118)
Total Bilirubin: 0.7 mg/dl (ref 0.20–1.60)

## 2013-02-21 LAB — CBC WITH DIFFERENTIAL (CANCER CENTER ONLY)
BASO%: 0.4 % (ref 0.0–2.0)
EOS%: 2.9 % (ref 0.0–7.0)
HCT: 28.3 % — ABNORMAL LOW (ref 34.8–46.6)
LYMPH#: 0.7 10*3/uL — ABNORMAL LOW (ref 0.9–3.3)
MCHC: 31.8 g/dL — ABNORMAL LOW (ref 32.0–36.0)
MONO%: 19.5 % — ABNORMAL HIGH (ref 0.0–13.0)
NEUT%: 63.7 % (ref 39.6–80.0)
RDW: 20.4 % — ABNORMAL HIGH (ref 11.1–15.7)

## 2013-02-21 LAB — TECHNOLOGIST REVIEW CHCC SATELLITE

## 2013-02-21 MED ORDER — SODIUM CHLORIDE 0.9 % IV SOLN
300.0000 mg/m2 | Freq: Once | INTRAVENOUS | Status: AC
  Administered 2013-02-21: 440 mg via INTRAVENOUS
  Filled 2013-02-21: qty 22

## 2013-02-21 MED ORDER — DEXTROSE 5 % IV SOLN
20.0000 mg/m2 | Freq: Once | INTRAVENOUS | Status: AC
  Administered 2013-02-21: 30 mg via INTRAVENOUS
  Filled 2013-02-21: qty 15

## 2013-02-21 MED ORDER — SODIUM CHLORIDE 0.9 % IJ SOLN
10.0000 mL | INTRAMUSCULAR | Status: DC | PRN
  Administered 2013-02-21: 10 mL
  Filled 2013-02-21: qty 10

## 2013-02-21 MED ORDER — ALTEPLASE 2 MG IJ SOLR
2.0000 mg | Freq: Once | INTRAMUSCULAR | Status: AC | PRN
  Administered 2013-02-21: 2 mg
  Filled 2013-02-21: qty 2

## 2013-02-21 MED ORDER — DEXAMETHASONE SODIUM PHOSPHATE 20 MG/5ML IJ SOLN: 40.0000 mg | Freq: Once | INTRAMUSCULAR | Status: DC

## 2013-02-21 MED ORDER — SODIUM CHLORIDE 0.9 % IV SOLN
Freq: Once | INTRAVENOUS | Status: AC
  Administered 2013-02-21: 14:00:00 via INTRAVENOUS

## 2013-02-21 MED ORDER — HEPARIN SOD (PORK) LOCK FLUSH 100 UNIT/ML IV SOLN
500.0000 [IU] | Freq: Once | INTRAVENOUS | Status: AC | PRN
  Administered 2013-02-21: 500 [IU]
  Filled 2013-02-21: qty 5

## 2013-02-21 MED ORDER — SODIUM CHLORIDE 0.9 % IV SOLN: Freq: Once | INTRAVENOUS | Status: DC

## 2013-02-21 MED ORDER — ONDANSETRON 8 MG/50ML IVPB (CHCC)
8.0000 mg | Freq: Once | INTRAVENOUS | Status: AC
  Administered 2013-02-21: 8 mg via INTRAVENOUS

## 2013-02-21 MED ORDER — DEXAMETHASONE SODIUM PHOSPHATE 20 MG/5ML IJ SOLN
40.0000 mg | Freq: Once | INTRAMUSCULAR | Status: AC
  Administered 2013-02-21: 40 mg via INTRAVENOUS
  Filled 2013-02-21: qty 10

## 2013-02-21 NOTE — Progress Notes (Signed)
1310  No blood return noted on PAc  Alteplase instilled in catheter.  Tried to reposition cathether and needle dislodged after medicine removed in syringe.  Alteplase reinstilled in catheter after reaccess

## 2013-02-21 NOTE — Patient Instructions (Signed)
[facility] Discharge Instructions for Patients Receiving Chemotherapy  Today you received the following chemotherapy agents Cytoxan, Avastin,   To help prevent nausea and vomiting after your treatment, we encourage you to take your nausea medication as prescribed   If you develop nausea and vomiting that is not controlled by your nausea medication, call the clinic. If it is after clinic hours your family physician or the after hours number for the clinic or go to the Emergency Department.   BELOW ARE SYMPTOMS THAT SHOULD BE REPORTED IMMEDIATELY:  *FEVER GREATER THAN 100.5 F  *CHILLS WITH OR WITHOUT FEVER  NAUSEA AND VOMITING THAT IS NOT CONTROLLED WITH YOUR NAUSEA MEDICATION  *UNUSUAL SHORTNESS OF BREATH  *UNUSUAL BRUISING OR BLEEDING  TENDERNESS IN MOUTH AND THROAT WITH OR WITHOUT PRESENCE OF ULCERS  *URINARY PROBLEMS  *BOWEL PROBLEMS  UNUSUAL RASH Items with * indicate a potential emergency and should be followed up as soon as possible.  One of the nurses will contact you 24 hours after your treatment. Please let the nurse know about any problems that you may have experienced. Feel free to call the clinic you have any questions or concerns. The clinic phone number is [phone].   I have been informed and understand all the instructions given to me. I know to contact the clinic, my physician, or go to the Emergency Department if any problems should occur. I do not have any questions at this time, but understand that I may call the clinic during office hours   should I have any questions or need assistance in obtaining follow up care.    __________________________________________  _____________  __________ Signature of Patient or Authorized Representative            Date                   Time    __________________________________________ Nurse's Signature

## 2013-02-22 ENCOUNTER — Other Ambulatory Visit: Payer: Self-pay | Admitting: *Deleted

## 2013-02-22 ENCOUNTER — Ambulatory Visit (HOSPITAL_BASED_OUTPATIENT_CLINIC_OR_DEPARTMENT_OTHER): Payer: Medicare Other

## 2013-02-22 ENCOUNTER — Telehealth: Payer: Self-pay | Admitting: Hematology & Oncology

## 2013-02-22 DIAGNOSIS — Z5112 Encounter for antineoplastic immunotherapy: Secondary | ICD-10-CM

## 2013-02-22 DIAGNOSIS — C9000 Multiple myeloma not having achieved remission: Secondary | ICD-10-CM

## 2013-02-22 DIAGNOSIS — T82598A Other mechanical complication of other cardiac and vascular devices and implants, initial encounter: Secondary | ICD-10-CM

## 2013-02-22 LAB — KAPPA/LAMBDA LIGHT CHAINS: Kappa:Lambda Ratio: 166.67 — ABNORMAL HIGH (ref 0.26–1.65)

## 2013-02-22 MED ORDER — DIPHENHYDRAMINE HCL 50 MG/ML IJ SOLN: 50.0000 mg | Freq: Once | INTRAMUSCULAR | Status: DC | PRN

## 2013-02-22 MED ORDER — EPINEPHRINE HCL 0.1 MG/ML IJ SOSY
0.2500 mg | PREFILLED_SYRINGE | Freq: Once | INTRAMUSCULAR | Status: DC | PRN
  Filled 2013-02-22: qty 10

## 2013-02-22 MED ORDER — SODIUM CHLORIDE 0.9 % IJ SOLN
10.0000 mL | INTRAMUSCULAR | Status: DC | PRN
  Administered 2013-02-22: 10 mL
  Filled 2013-02-22: qty 10

## 2013-02-22 MED ORDER — DEXAMETHASONE SODIUM PHOSPHATE 20 MG/5ML IJ SOLN: 40.0000 mg | Freq: Once | INTRAMUSCULAR | Status: DC

## 2013-02-22 MED ORDER — EPINEPHRINE HCL 1 MG/ML IJ SOLN
0.5000 mg | Freq: Once | INTRAMUSCULAR | Status: DC | PRN
Start: 2013-02-22 — End: 2013-02-22

## 2013-02-22 MED ORDER — SODIUM CHLORIDE 0.9 % IV SOLN
Freq: Once | INTRAVENOUS | Status: AC
  Administered 2013-02-22: 13:00:00 via INTRAVENOUS

## 2013-02-22 MED ORDER — HEPARIN SOD (PORK) LOCK FLUSH 100 UNIT/ML IV SOLN
500.0000 [IU] | Freq: Once | INTRAVENOUS | Status: AC | PRN
  Administered 2013-02-22: 500 [IU]
  Filled 2013-02-22: qty 5

## 2013-02-22 MED ORDER — SODIUM CHLORIDE 0.9 % IV SOLN: Freq: Once | INTRAVENOUS | Status: DC | PRN

## 2013-02-22 MED ORDER — DIPHENHYDRAMINE HCL 50 MG/ML IJ SOLN: 25.0000 mg | Freq: Once | INTRAMUSCULAR | Status: DC | PRN

## 2013-02-22 MED ORDER — SODIUM CHLORIDE 0.9 % IV SOLN
Freq: Once | INTRAVENOUS | Status: DC
Start: 2013-02-22 — End: 2013-02-22

## 2013-02-22 MED ORDER — FAMOTIDINE IN NACL 20-0.9 MG/50ML-% IV SOLN: 20.0000 mg | Freq: Once | INTRAVENOUS | Status: DC | PRN

## 2013-02-22 MED ORDER — METHYLPREDNISOLONE SODIUM SUCC 125 MG IJ SOLR: 125.0000 mg | Freq: Once | INTRAMUSCULAR | Status: DC | PRN

## 2013-02-22 MED ORDER — ONDANSETRON 8 MG/50ML IVPB (CHCC)
8.0000 mg | Freq: Once | INTRAVENOUS | Status: AC
  Administered 2013-02-22: 8 mg via INTRAVENOUS

## 2013-02-22 MED ORDER — DEXTROSE 5 % IV SOLN
20.0000 mg/m2 | Freq: Once | INTRAVENOUS | Status: AC
  Administered 2013-02-22: 30 mg via INTRAVENOUS
  Filled 2013-02-22: qty 15

## 2013-02-22 MED ORDER — SODIUM CHLORIDE 0.9 % IJ SOLN
3.0000 mL | INTRAMUSCULAR | Status: DC | PRN
  Filled 2013-02-22: qty 10

## 2013-02-22 MED ORDER — HEPARIN SOD (PORK) LOCK FLUSH 100 UNIT/ML IV SOLN
250.0000 [IU] | Freq: Once | INTRAVENOUS | Status: DC | PRN
  Filled 2013-02-22: qty 5

## 2013-02-22 MED ORDER — EPINEPHRINE HCL 1 MG/ML IJ SOLN: 0.5000 mg | Freq: Once | INTRAMUSCULAR | Status: DC | PRN

## 2013-02-22 MED ORDER — ALBUTEROL SULFATE (2.5 MG/3ML) 0.083% IN NEBU
2.5000 mg | INHALATION_SOLUTION | Freq: Once | RESPIRATORY_TRACT | Status: DC | PRN
  Filled 2013-02-22: qty 3

## 2013-02-22 MED ORDER — ALTEPLASE 2 MG IJ SOLR
2.0000 mg | Freq: Once | INTRAMUSCULAR | Status: DC | PRN
  Filled 2013-02-22: qty 2

## 2013-02-22 NOTE — Telephone Encounter (Signed)
Tina in IR will contact Pt to schedule port

## 2013-02-22 NOTE — Patient Instructions (Signed)
Carfilzomib injection What is this medicine? CARFILZOMIB is a chemotherapy drug that works by slowing or stopping cancer cell growth. This medicine is used to treat multiple myeloma. This medicine may be used for other purposes; ask your health care provider or pharmacist if you have questions. What should I tell my health care provider before I take this medicine? They need to know if you have any of these conditions: -heart disease -irregular heartbeat -liver disease -lung or breathing disease -an unusual or allergic reaction to carfilzomib, or other medicines, foods, dyes, or preservatives -pregnant or trying to get pregnant -breast-feeding How should I use this medicine? This medicine is for injection or infusion into a vein. It is given by a health care professional in a hospital or clinic setting. Talk to your pediatrician regarding the use of this medicine in children. Special care may be needed. Overdosage: If you think you've taken too much of this medicine contact a poison control center or emergency room at once. Overdosage: If you think you have taken too much of this medicine contact a poison control center or emergency room at once. NOTE: This medicine is only for you. Do not share this medicine with others. What if I miss a dose? It is important not to miss your dose. Call your doctor or health care professional if you are unable to keep an appointment. What may interact with this medicine? Interactions are not expected. Give your health care provider a list of all the medicines, herbs, non-prescription drugs, or dietary supplements you use. Also tell them if you smoke, drink alcohol, or use illegal drugs. Some items may interact with your medicine. This list may not describe all possible interactions. Give your health care provider a list of all the medicines, herbs, non-prescription drugs, or dietary supplements you use. Also tell them if you smoke, drink alcohol, or use  illegal drugs. Some items may interact with your medicine. What should I watch for while using this medicine? Your condition will be monitored carefully while you are receiving this medicine. Report any side effects. Continue your course of treatment even though you feel ill unless your doctor tells you to stop. Call your doctor or health care professional for advice if you get a fever, chills or sore throat, or other symptoms of a cold or flu. Do not treat yourself. Try to avoid being around people who are sick. Do not become pregnant while taking this medicine. Women should inform their doctor if they wish to become pregnant or think they might be pregnant. There is a potential for serious side effects to an unborn child. Talk to your health care professional or pharmacist for more information. Do not breast-feed an infant while taking this medicine. Check with your doctor or health care professional if you get an attack of severe diarrhea, nausea and vomiting, or if you sweat a lot. The loss of too much body fluid can make it dangerous for you to take this medicine. You may get dizzy. Do not drive, use machinery, or do anything that needs mental alertness until you know how this medicine affects you. Do not stand or sit up quickly, especially if you are an older patient. This reduces the risk of dizzy or fainting spells. What side effects may I notice from receiving this medicine? Side effects that you should report to your doctor or health care professional as soon as possible: -allergic reactions like skin rash, itching or hives, swelling of the face, lips, or tongue -breathing   problems -chest pain or palpitationschest tightness -cough -dark urine -dizziness -feeling faint or lightheaded -fever or chills -general ill feeling or flu-like symptoms -light-colored stools -palpitations -right upper belly pain -swelling of the legs or ankles -unusual bleeding or bruising -unusually weak or  tired -yellowing of the eyes or skin  Side effects that usually do not require medical attention (Report these to your doctor or health care professional if they continue or are bothersome.): -diarrhea -headache -nausea, vomiting -tiredness This list may not describe all possible side effects. Call your doctor for medical advice about side effects. You may report side effects to FDA at 1-800-FDA-1088. Where should I keep my medicine? This drug is given in a hospital or clinic and will not be stored at home. NOTE: This sheet is a summary. It may not cover all possible information. If you have questions about this medicine, talk to your doctor, pharmacist, or health care provider.  2013, [employer]. (09/23/2011 4:16:30 PM)  

## 2013-02-23 ENCOUNTER — Ambulatory Visit (HOSPITAL_COMMUNITY)
Admission: RE | Admit: 2013-02-23 | Discharge: 2013-02-23 | Disposition: A | Discharge status: Home / Self Care | Payer: Medicare Other | Source: Ambulatory Visit | Attending: Hematology & Oncology | Admitting: Hematology & Oncology

## 2013-02-23 ENCOUNTER — Other Ambulatory Visit: Payer: Self-pay | Admitting: Radiology

## 2013-02-23 ENCOUNTER — Other Ambulatory Visit (HOSPITAL_COMMUNITY): Payer: Medicare Other

## 2013-02-23 DIAGNOSIS — C9000 Multiple myeloma not having achieved remission: Secondary | ICD-10-CM | POA: Insufficient documentation

## 2013-02-23 DIAGNOSIS — S42109A Fracture of unspecified part of scapula, unspecified shoulder, initial encounter for closed fracture: Secondary | ICD-10-CM | POA: Insufficient documentation

## 2013-02-23 DIAGNOSIS — X58XXXA Exposure to other specified factors, initial encounter: Secondary | ICD-10-CM | POA: Insufficient documentation

## 2013-02-23 DIAGNOSIS — S2249XA Multiple fractures of ribs, unspecified side, initial encounter for closed fracture: Secondary | ICD-10-CM | POA: Insufficient documentation

## 2013-02-23 MED ORDER — ONDANSETRON HCL 4 MG/2ML IJ SOLN
4.0000 mg | Freq: Once | INTRAMUSCULAR | Status: AC
  Administered 2013-02-23: 4 mg via INTRAVENOUS

## 2013-02-23 MED ORDER — FLUDEOXYGLUCOSE F - 18 (FDG) INJECTION
16.0000 | Freq: Once | INTRAVENOUS | Status: AC | PRN
  Administered 2013-02-23: 16 via INTRAVENOUS

## 2013-02-23 MED ORDER — ONDANSETRON HCL 4 MG/2ML IJ SOLN
INTRAMUSCULAR | Status: AC
  Filled 2013-02-23: qty 2

## 2013-02-28 ENCOUNTER — Ambulatory Visit (HOSPITAL_COMMUNITY)
Admission: RE | Admit: 2013-02-28 | Discharge: 2013-02-28 | Disposition: A | Discharge status: Home / Self Care | Payer: Medicare Other | Source: Ambulatory Visit | Attending: Hematology & Oncology | Admitting: Hematology & Oncology

## 2013-02-28 ENCOUNTER — Other Ambulatory Visit: Payer: Self-pay | Admitting: Medical

## 2013-02-28 ENCOUNTER — Other Ambulatory Visit: Payer: Self-pay | Admitting: Hematology & Oncology

## 2013-02-28 ENCOUNTER — Encounter (HOSPITAL_COMMUNITY): Payer: Self-pay

## 2013-02-28 DIAGNOSIS — C9000 Multiple myeloma not having achieved remission: Secondary | ICD-10-CM

## 2013-02-28 DIAGNOSIS — C8589 Other specified types of non-Hodgkin lymphoma, extranodal and solid organ sites: Secondary | ICD-10-CM | POA: Insufficient documentation

## 2013-02-28 DIAGNOSIS — T82598A Other mechanical complication of other cardiac and vascular devices and implants, initial encounter: Secondary | ICD-10-CM

## 2013-02-28 DIAGNOSIS — Z79899 Other long term (current) drug therapy: Secondary | ICD-10-CM | POA: Insufficient documentation

## 2013-02-28 DIAGNOSIS — Y831 Surgical operation with implant of artificial internal device as the cause of abnormal reaction of the patient, or of later complication, without mention of misadventure at the time of the procedure: Secondary | ICD-10-CM | POA: Insufficient documentation

## 2013-02-28 LAB — PROTIME-INR
INR: 0.83 (ref 0.00–1.49)
Prothrombin Time: 11.4 seconds — ABNORMAL LOW (ref 11.6–15.2)

## 2013-02-28 LAB — BASIC METABOLIC PANEL
Calcium: 9.8 mg/dL (ref 8.4–10.5)
GFR calc Af Amer: 39 mL/min — ABNORMAL LOW (ref >90)
GFR calc non Af Amer: 34 mL/min — ABNORMAL LOW (ref >90)
Sodium: 141 mEq/L (ref 135–145)

## 2013-02-28 LAB — CBC
MCH: 34.8 pg — ABNORMAL HIGH (ref 26.0–34.0)
Platelets: 51 10*3/uL — ABNORMAL LOW (ref 150–400)
RBC: 2.33 MIL/uL — ABNORMAL LOW (ref 3.87–5.11)
WBC: 4.4 10*3/uL (ref 4.0–10.5)

## 2013-02-28 LAB — APTT: aPTT: 28 seconds (ref 24–37)

## 2013-02-28 MED ORDER — HEPARIN SOD (PORK) LOCK FLUSH 100 UNIT/ML IV SOLN: 500.0000 [IU] | Freq: Once | INTRAVENOUS | Status: DC

## 2013-02-28 MED ORDER — ONDANSETRON HCL 4 MG/2ML IJ SOLN
INTRAMUSCULAR | Status: AC
  Filled 2013-02-28: qty 2

## 2013-02-28 MED ORDER — IOHEXOL 300 MG/ML  SOLN
10.0000 mL | Freq: Once | INTRAMUSCULAR | Status: AC | PRN
  Administered 2013-02-28: 1 mL via INTRAVENOUS

## 2013-02-28 MED ORDER — MIDAZOLAM HCL 2 MG/2ML IJ SOLN
INTRAMUSCULAR | Status: AC | PRN
  Administered 2013-02-28: 1 mg via INTRAVENOUS
  Administered 2013-02-28: 2 mg via INTRAVENOUS
  Administered 2013-02-28: 1 mg via INTRAVENOUS
  Administered 2013-02-28: 2 mg via INTRAVENOUS

## 2013-02-28 MED ORDER — LORAZEPAM 2 MG/ML IJ SOLN
1.0000 mg | Freq: Once | INTRAMUSCULAR | Status: AC
  Administered 2013-02-28: 1 mg via INTRAVENOUS
  Filled 2013-02-28: qty 1

## 2013-02-28 MED ORDER — VANCOMYCIN HCL IN DEXTROSE 1-5 GM/200ML-% IV SOLN
1000.0000 mg | Freq: Once | INTRAVENOUS | Status: AC
  Administered 2013-02-28: 1000 mg via INTRAVENOUS
  Filled 2013-02-28: qty 200

## 2013-02-28 MED ORDER — MIDAZOLAM HCL 2 MG/2ML IJ SOLN
INTRAMUSCULAR | Status: AC
  Filled 2013-02-28: qty 4

## 2013-02-28 MED ORDER — SODIUM CHLORIDE 0.9 % IV SOLN
Freq: Once | INTRAVENOUS | Status: AC
  Administered 2013-02-28: 08:00:00 via INTRAVENOUS

## 2013-02-28 MED ORDER — ONDANSETRON HCL 4 MG/2ML IJ SOLN
4.0000 mg | Freq: Once | INTRAMUSCULAR | Status: AC
  Administered 2013-02-28: 4 mg via INTRAVENOUS

## 2013-02-28 MED ORDER — FENTANYL CITRATE 0.05 MG/ML IJ SOLN
INTRAMUSCULAR | Status: AC | PRN
  Administered 2013-02-28: 100 ug via INTRAVENOUS
  Administered 2013-02-28 (×2): 50 ug via INTRAVENOUS

## 2013-02-28 MED ORDER — FENTANYL CITRATE 0.05 MG/ML IJ SOLN
INTRAMUSCULAR | Status: AC
  Filled 2013-02-28: qty 4

## 2013-02-28 NOTE — Progress Notes (Signed)
Called Amy RN who works with Dr Myna Hidalgo. Amy informed that patient had a new picc which would require home care for dressing changes and flushes. Amy RN to arrange home care.

## 2013-02-28 NOTE — Procedures (Signed)
Successful removal of right anterior chest wall port-a-cath. 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 No new port placed. No immediate post procedural complications.

## 2013-02-28 NOTE — Procedures (Signed)
Successful placement of right basilic vein approach midline line 22 cm dual lumen PICC line with tip terminating within the right axilla.  The PICC line is ready for immediate use.

## 2013-02-28 NOTE — H&P (Signed)
Chief Complaint: "I'm here for a new port" Referring Physician:Ennever HPI: [name] is an 67 y.o. female with hx of lymphoma. She had a dual Port placed several years ago. This IR dept has done injections and tPA infusions in the past dating back to 2009. Over the past year, she has had continued troubles with the port. They have no trouble infusing, but cannot draw blood back. If the give Cathflo, sometimes they are able to get blood return. She is at the point where she would like her current port removed and a new one placed. PMHx and meds reviewed.  Past Medical History:  Past Medical History  Diagnosis Date  . Myeloma   . Anxiety   . Hypothyroidism   . GERD (gastroesophageal reflux disease)   . Myeloma   . OAB (overactive bladder)   . COPD (chronic obstructive pulmonary disease)   . Osteoporosis   . Depression   . Chronic pain   . Renal insufficiency     Past Surgical History:  Past Surgical History  Procedure Laterality Date  . Leg surgery    . Cholecystectomy    . Tonsillectomy    . Breast surgery    . Limbal stem cell transplant    . Abdominal hysterectomy    . Esophagogastroduodenoscopy  06/11/2012    Procedure: ESOPHAGOGASTRODUODENOSCOPY (EGD);  Surgeon: Willis Modena, MD;  Location: [facility];  Service: Endoscopy;  Laterality: N/A;    Family History:  Family History  Problem Relation Age of Onset  . Other Mother 57    natural causes  . Lung cancer Father     Social History:  reports that she has never smoked. She has never used smokeless tobacco. She reports that she does not drink alcohol or use illicit drugs.  Allergies:  Allergies  Allergen Reactions  . Hydrocodone-Acetaminophen Itching    Generalized itching  . Trazodone And Nefazodone Other (See Comments)    nightmares  . Hydromorphone Itching  . Tramadol Itching  . Morphine And Related Rash  . Penicillins Rash    Medications: acyclovir (ZOVIRAX) 400 MG tablet 10/05/2012 Sig -  Route: Take 400 mg by mouth 2 (two) times daily. - Oral Class: Historical Med Number of times this order has been changed since signing: 1 Order [address] Alum & Mag Hydroxide-Simeth (MAGIC MOUTHWASH) SOLN Sig - Route: Take 5 mLs by mouth as needed (D[employer] formula--no Simethicone. For sores in mouth.). - Oral Class: Historical Med Number of times this order has been changed since signing: 8 Order [address] benzonatate (TESSALON) 100 MG capsule Sig - Route: Take 100 mg by mouth daily as needed. For cough. - Oral Class: Historical Med Number of times this order has been changed since signing: 4 Order [address] DULoxetine (CYMBALTA) 30 MG capsule Sig - Route: Take 30 mg by mouth every morning. - Oral Class: Historical Med Number of times this order has been changed since signing: 6 Order [address] esomeprazole (NEXIUM) 40 MG capsule Sig - Route: Take 40 mg by mouth every morning. - Oral Class: Historical Med Number of times this order has been changed since signing: 7 Order [address] levothyroxine (SYNTHROID, LEVOTHROID) 50 MCG tablet Sig - Route: Take 50 mcg by mouth every morning. - Oral Class: Historical Med Number of times this order has been changed since signing: 10 Order [address] olopatadine (PATANOL) 0.1 % ophthalmic solution Sig - Route: Place 1 drop into both eyes 2 (two) times daily as needed (For allergies.). -  Both Eyes Class: Historical Med Number of times this order has been changed since signing: 5 Order [address] pirbuterol (MAXAIR) 200 MCG/INH inhaler 08/15/2012 Sig - Route: Inhale 1-2 puffs into the lungs every 12 (twelve) hours as needed. For wheezing or shortness of breath. - Inhalation Class: Historical Med Number of times this order has been changed since signing: 2 Order [address] Vitamin D, Ergocalciferol, (DRISDOL) 50000 UNITS CAPS Sig - Route: Take 50,000 Units by mouth every Monday. - Oral Class: Historical Med   Please HPI for pertinent positives, otherwise complete 10  system ROS negative.  Physical Exam: Blood pressure 123/59, pulse 81, temperature 98.1 F (36.7 C), temperature source Oral, resp. rate 16, height 4\' 11"  (1.499 m), weight 118 lb (53.524 kg), SpO2 100.00%. Body mass index is 23.82 kg/(m^2).   General Appearance:  Alert, cooperative, no distress, appears stated age  Head:  Normocephalic, without obvious abnormality, atraumatic  ENT: Unremarkable  Neck: Supple, symmetrical, trachea midline  Lungs:   Clear to auscultation bilaterally, no w/r/r, respirations unlabored without use of accessory muscles.  Chest Wall:  Rt port palpable  Heart:  Regular rate and rhythm, S1, S2 normal, no murmur, rub or gallop.   Abdomen:   Soft, non-tender, non distended  Neurologic: Normal affect, no gross deficits.   Results for orders placed during the hospital encounter of 02/28/13 (from the past 48 hour(s))  APTT     Status: None   Collection Time    02/28/13  7:10 AM      Result Value Range   aPTT 28  24 - 37 seconds  BASIC METABOLIC PANEL     Status: Abnormal   Collection Time    02/28/13  7:10 AM      Result Value Range   Sodium 141  135 - 145 mEq/L   Potassium 3.4 (*) 3.5 - 5.1 mEq/L   Chloride 104  96 - 112 mEq/L   CO2 28  19 - 32 mEq/L   Glucose, Bld 114 (*) 70 - 99 mg/dL   BUN 21  6 - 23 mg/dL   Creatinine, Ser 1.19 (*) 0.50 - 1.10 mg/dL   Calcium 9.8  8.4 - 14.7 mg/dL   GFR calc non Af Amer 34 (*) >90 mL/min   GFR calc Af Amer 39 (*) >90 mL/min   Comment:            The eGFR has been calculated     using the CKD EPI equation.     This calculation has not been     validated in all clinical     situations.     eGFR's persistently     <90 mL/min signify     possible Chronic Kidney Disease.  CBC     Status: Abnormal   Collection Time    02/28/13  7:10 AM      Result Value Range   WBC 4.4  4.0 - 10.5 K/uL   RBC 2.33 (*) 3.87 - 5.11 MIL/uL   Hemoglobin 8.1 (*) 12.0 - 15.0 g/dL   HCT 82.9 (*) 56.2 - 13.0 %   MCV 107.3 (*) 78.0 -  100.0 fL   MCH 34.8 (*) 26.0 - 34.0 pg   MCHC 32.4  30.0 - 36.0 g/dL   RDW 86.5 (*) 78.4 - 69.6 %   Platelets 51 (*) 150 - 400 K/uL   Comment: SPECIMEN CHECKED FOR CLOTS     REPEATED TO VERIFY     PLATELET COUNT CONFIRMED  BY SMEAR  PROTIME-INR     Status: Abnormal   Collection Time    02/28/13  7:10 AM      Result Value Range   Prothrombin Time 11.4 (*) 11.6 - 15.2 seconds   INR 0.83  0.00 - 1.49   No results found.  Assessment/Plan Dysfunctional portacath Lymphoma For removal of DualPort and placement of new single lumen [employer] reviewed. Consent signed in chart Brayton El PA-C 02/28/2013, 8:25 AM

## 2013-02-28 NOTE — Progress Notes (Signed)
Patient states she feels better now after Zofran. Dr. Grace Isaac has just been in to see patient and friend. Will review d/c instructions with patient and d/c home. Patient to see Dr. Myna Hidalgo tomorrow.

## 2013-03-01 ENCOUNTER — Encounter (HOSPITAL_COMMUNITY)
Admission: RE | Admit: 2013-03-01 | Discharge: 2013-03-01 | Disposition: A | Discharge status: Home / Self Care | Payer: Medicare Other | Source: Ambulatory Visit | Attending: Hematology & Oncology | Admitting: Hematology & Oncology

## 2013-03-01 ENCOUNTER — Ambulatory Visit (HOSPITAL_BASED_OUTPATIENT_CLINIC_OR_DEPARTMENT_OTHER): Payer: Medicare Other

## 2013-03-01 ENCOUNTER — Ambulatory Visit (HOSPITAL_BASED_OUTPATIENT_CLINIC_OR_DEPARTMENT_OTHER): Payer: Medicare Other | Admitting: Lab

## 2013-03-01 ENCOUNTER — Ambulatory Visit (HOSPITAL_BASED_OUTPATIENT_CLINIC_OR_DEPARTMENT_OTHER): Payer: Medicare Other | Admitting: Medical

## 2013-03-01 VITALS — BP 127/50 | HR 91 | Temp 98.1°F | Resp 16 | Ht <= 58 in | Wt 120.0 lb

## 2013-03-01 DIAGNOSIS — Z5112 Encounter for antineoplastic immunotherapy: Secondary | ICD-10-CM

## 2013-03-01 DIAGNOSIS — C9002 Multiple myeloma in relapse: Secondary | ICD-10-CM

## 2013-03-01 DIAGNOSIS — C9000 Multiple myeloma not having achieved remission: Secondary | ICD-10-CM

## 2013-03-01 DIAGNOSIS — D630 Anemia in neoplastic disease: Secondary | ICD-10-CM

## 2013-03-01 LAB — CMP (CANCER CENTER ONLY)
ALT(SGPT): 54 U/L — ABNORMAL HIGH (ref 10–47)
AST: 41 U/L — ABNORMAL HIGH (ref 11–38)
Alkaline Phosphatase: 122 U/L — ABNORMAL HIGH (ref 26–84)
CO2: 28 mEq/L (ref 18–33)
Creat: 1.6 mg/dl — ABNORMAL HIGH (ref 0.6–1.2)
Sodium: 143 mEq/L (ref 128–145)
Total Bilirubin: 0.8 mg/dl (ref 0.20–1.60)
Total Protein: 6.6 g/dL (ref 6.4–8.1)

## 2013-03-01 LAB — CBC WITH DIFFERENTIAL (CANCER CENTER ONLY)
BASO%: 0.4 % (ref 0.0–2.0)
EOS%: 3 % (ref 0.0–7.0)
HCT: 24.9 % — ABNORMAL LOW (ref 34.8–46.6)
LYMPH%: 13.7 % — ABNORMAL LOW (ref 14.0–48.0)
MCH: 35.8 pg — ABNORMAL HIGH (ref 26.0–34.0)
MCHC: 31.3 g/dL — ABNORMAL LOW (ref 32.0–36.0)
MCV: 114 fL — ABNORMAL HIGH (ref 81–101)
MONO%: 12.4 % (ref 0.0–13.0)
NEUT%: 70.5 % (ref 39.6–80.0)
Platelets: 56 10*3/uL — ABNORMAL LOW (ref 145–400)
RDW: 20.8 % — ABNORMAL HIGH (ref 11.1–15.7)

## 2013-03-01 LAB — TECHNOLOGIST REVIEW CHCC SATELLITE: Tech Review: 1

## 2013-03-01 MED ORDER — HEPARIN SOD (PORK) LOCK FLUSH 100 UNIT/ML IV SOLN
250.0000 [IU] | Freq: Once | INTRAVENOUS | Status: AC | PRN
  Administered 2013-03-01: 250 [IU]
  Filled 2013-03-01: qty 5

## 2013-03-01 MED ORDER — ONDANSETRON 8 MG/50ML IVPB (CHCC)
8.0000 mg | Freq: Once | INTRAVENOUS | Status: AC
  Administered 2013-03-01: 8 mg via INTRAVENOUS

## 2013-03-01 MED ORDER — SODIUM CHLORIDE 0.9 % IV SOLN
Freq: Once | INTRAVENOUS | Status: AC
  Administered 2013-03-01: 12:00:00 via INTRAVENOUS

## 2013-03-01 MED ORDER — SODIUM CHLORIDE 0.9 % IJ SOLN
10.0000 mL | INTRAMUSCULAR | Status: DC | PRN
  Administered 2013-03-01: 10 mL
  Filled 2013-03-01: qty 10

## 2013-03-01 MED ORDER — HEPARIN SOD (PORK) LOCK FLUSH 100 UNIT/ML IV SOLN
500.0000 [IU] | Freq: Once | INTRAVENOUS | Status: AC | PRN
  Administered 2013-03-01: 250 [IU]
  Filled 2013-03-01: qty 5

## 2013-03-01 MED ORDER — ONDANSETRON HCL 8 MG PO TABS: 8.0000 mg | ORAL_TABLET | Freq: Three times a day (TID) | ORAL | Status: DC | PRN

## 2013-03-01 MED ORDER — DEXTROSE 5 % IV SOLN
20.0000 mg/m2 | Freq: Once | INTRAVENOUS | Status: AC
  Administered 2013-03-01: 30 mg via INTRAVENOUS
  Filled 2013-03-01: qty 15

## 2013-03-01 MED ORDER — LORAZEPAM 2 MG/ML IJ SOLN
0.5000 mg | Freq: Once | INTRAMUSCULAR | Status: AC
  Administered 2013-03-01: 0.5 mg via INTRAVENOUS

## 2013-03-01 MED ORDER — DEXAMETHASONE SODIUM PHOSPHATE 20 MG/5ML IJ SOLN
40.0000 mg | Freq: Once | INTRAMUSCULAR | Status: AC
  Administered 2013-03-01: 40 mg via INTRAVENOUS
  Filled 2013-03-01: qty 10

## 2013-03-01 MED ORDER — LORAZEPAM 2 MG/ML IJ SOLN
1.0000 mg | Freq: Once | INTRAMUSCULAR | Status: AC
  Administered 2013-03-01: 0.5 mg via INTRAVENOUS

## 2013-03-01 MED ORDER — SODIUM CHLORIDE 0.9 % IV SOLN
300.0000 mg/m2 | Freq: Once | INTRAVENOUS | Status: AC
  Administered 2013-03-01: 440 mg via INTRAVENOUS
  Filled 2013-03-01: qty 22

## 2013-03-01 MED ORDER — SODIUM CHLORIDE 0.9 % IV SOLN: Freq: Once | INTRAVENOUS | Status: DC

## 2013-03-01 NOTE — Patient Instructions (Addendum)
Peripherally Inserted Central Catheter (PICC) Home Guide A peripherally inserted central catheter (PICC) is a long, thin, flexible tube that is inserted into a vein in the upper arm. It is a form of intravenous (IV) access. It is considered to be a "central" line because the tip of the PICC ends in a large vein in your chest. This large vein is called the superior vena cava (SVC). The PICC tip ends in the SVC because there is a lot of blood flow in the SVC. This allows medicines and IV fluids to be quickly distributed throughout the body. The PICC is inserted using a sterile technique by a specially trained nurse or physician. After the PICC is inserted, a chest X-ray is done to be sure it is in the correct place.  A PICC may be placed for different reasons, such as:  To give medicines and liquid nutrition that can only be given through a central line. Examples are:  Certain antibiotic treatments.  Chemotherapy.  Total parenteral nutrition (TPN).  To take frequent blood samples.  To give IV fluids and blood products.  If there is difficulty placing a peripheral intravenous (PIV) catheter. If taken care of properly, a PICC can remain in place for several months. A PICC can also allow patients to go home early. Medicine and PICC care can be managed at home by a family member or home healthcare team. RISKS AND COMPLICATIONS Possible problems with a PICC can occasionally occur. This may include:  A clot (thrombus) forming in or at the tip of the PICC. This can cause the PICC to become clogged. A "clot-busting" medicine called tissue plasminogen activator (tPA) can be inserted into the PICC to help break up the clot.  Inflammation of the vein (phlebitis) in which the PICC is placed. Signs of inflammation may include redness, pain at the insertion site, red streaks, or being able to feel a "cord" in the vein where the PICC is located.  Infection in the PICC or at the insertion site. Signs of  infection may include fever, chills, redness, swelling, or pus drainage from the PICC insertion site.  PICC movement (malposition). The PICC tip may migrate from its original position due to excessive physical activity, forceful coughing, sneezing, or vomiting.  A break or cut in the PICC. It is important to not use scissors near the PICC.  Nerve or tendon irritation or injury during PICC insertion. HOME CARE INSTRUCTIONS Activity  You may bend your arm and move it freely. If your PICC is near or at the bend of your elbow, avoid activity with repeated motion at the elbow.  Avoid lifting heavy objects as instructed by your caregiver.  Avoid using a crutch with the arm on the same side as your PICC. You may need to use a walker. PICC Dressing  Keep your PICC bandage (dressing) clean and dry to prevent infection.  Ask your caregiver when you may shower. Ask your caregiver to teach you how to wrap the PICC when you do take a shower.  Do not bathe, swim, or use hot tubs when you have a PICC.  Change the PICC dressing as instructed by your caregiver.  Change your PICC dressing if it becomes loose or wet. General PICC Care  Check the PICC insertion site daily for leakage, redness, swelling, or pain.  Flush the PICC as directed by your caregiver. Let your caregiver know right away if the PICC is difficult to flush or does not flush. Do not use force   or pain.   Flush the PICC as directed by your caregiver. Let your caregiver know right away if the PICC is difficult to flush or does not flush. Do not use force to flush the PICC.   Do not use a syringe that is less than 10 mLs to flush the PICC.   Never pull or tug on the PICC.   Avoid blood pressure checks on the arm with the PICC.   Keep your PICC identification card with you at all times.   Do not take the PICC out yourself. Only a trained clinical professional should remove the PICC.  SEEK IMMEDIATE MEDICAL CARE IF:   Your PICC is accidently pulled all the way out. If this happens, cover the insertion site with a bandage or gauze dressing. Do not throw the PICC away. Your caregiver will need to  inspect it.   Your PICC was tugged or pulled and has partially come out. Do not  push the PICC back in.   There is any type of drainage, redness, or swelling where the PICC enters the skin.   You cannot flush the PICC, it is difficult to flush, or the PICC leaks around the insertion site when it is flushed.   You hear a "flushing" sound when the PICC is flushed.   You have pain, discomfort, or numbness in your arm, shoulder, or jaw on the same side as the PICC .   You feel your heart "racing" or skipping beats.   You notice a hole or tear in the PICC.   You develop chills or a fever.  MAKE SURE YOU:    Understand these instructions.   Will watch your condition.   Will get help right away if you are not doing well or get worse.  Document Released: 04/10/2003 Document Revised: 12/27/2011 Document Reviewed: 02/08/2011  [employer] Patient Information 2013 [employer]

## 2013-03-01 NOTE — Progress Notes (Signed)
DIAGNOSIS:  Refractory kappa light chain myeloma.  CURRENT THERAPY: 1. Currently on Cytoxan/Kyprolis/Dexamethasone (The patient to consider a clinical trial at [facility]). 2. IVIG monthly. 3. Aranesp 300 mcg subcu as needed for hemoglobin less than 10. 4. Zometa 3.3 mg IV monthly.  INTERIM HISTORY: Ms. Vanderpool presents today for an office followup visit.  She had her Port-A-Cath removed yesterday and unfortunately secondary to poor venous access, another Port-A-Cath was unable to be placed.  She did receive a PICC line.  Ms. Arthor Captain is extremely upset about this.  She is currently receiving Cytoxan/Kyprolis/dexamethasone.  She is here today for her cycle 1 day 8.  Overall she tolerated therapy relatively well.  There is one time she said she was nauseated.  She does have her anti-emetics.  Of note, she did see Dr. Marissa Calamity.  He is trying to figure out what clinical drops she might be appropriate for her.  Her kappa free light chain back in may was 5.0 mg/dL, her IgA and Ree Kida IgM levels back in March were markedly depressed.  Her M spike was not detected.  She states she has a good appetite she denies any diarrhea constipation chest pain shortness breath or cough she denies any fevers chills or night sweats she denies any abdominal pain any obvious or abnormal bleeding she denies any lower leg swelling she denies any headaches visual changes or rashes she denies any changes in her bowel or bladder habits.  Of note, she does report an increase in fatigue.  Her hemoglobin is 7.8.  We will go ahead and get her set up with a blood transfusion tomorrow.  Review of Systems: Constitutional:Negative for malaise/fatigue, fever, chills, weight loss, diaphoresis, activity change, appetite change, and unexpected weight change.  HEENT: Negative for double vision, blurred vision, visual loss, ear pain, tinnitus, congestion, rhinorrhea, epistaxis sore throat or sinus disease, oral pain/lesion, tongue  soreness Respiratory: Negative for cough, chest tightness, shortness of breath, wheezing and stridor.  Cardiovascular: Negative for chest pain, palpitations, leg swelling, orthopnea, PND, DOE or claudication Gastrointestinal: Negative for nausea, vomiting, abdominal pain, diarrhea, constipation, blood in stool, melena, hematochezia, abdominal distention, anal bleeding, rectal pain, anorexia and hematemesis.  Genitourinary: Negative for dysuria, frequency, hematuria,  Musculoskeletal: Negative for myalgias, back pain, joint swelling, arthralgias and gait problem.  Skin: Negative for rash, color change, pallor and wound.  Neurological:. Negative for dizziness/light-headedness, tremors, seizures, syncope, facial asymmetry, speech difficulty, weakness, numbness, headaches and paresthesias.  Hematological: Negative for adenopathy. Does not bruise/bleed easily.  Psychiatric/Behavioral:  Negative for depression, no loss of interest in normal activity or change in sleep pattern.   Physical Exam: This is a pleasant 67 year old white female in no obvious distress Vitals: Temperature 98.8 degrees pulse 91 respirations 16 blood pressure 127/50 weight 120 pounds HEENT reveals a normocephalic, atraumatic skull, no scleral icterus, no oral lesions  Neck is supple without any cervical or supraclavicular adenopathy.  Lungs are clear to auscultation bilaterally. There are no wheezes, rales or rhonci Cardiac is regular rate and rhythm with a normal S1 and S2. There are no murmurs, rubs, or bruits.  Abdomen is soft with good bowel sounds, there is no palpable mass. There is no palpable hepatosplenomegaly. There is no palpable fluid wave.  Musculoskeletal no tenderness of the spine, ribs, or hips.  Extremities there are no clubbing, cyanosis, or edema.  Skin no petechia, purpura or ecchymosis Neurologic is nonfocal.  Laboratory Data: White count 5.0 hemoglobin 7.8 hematocrit 24.9 platelets 56,000  Current  Outpatient Prescriptions on File Prior to Visit  Medication Sig Dispense Refill  . acyclovir (ZOVIRAX) 400 MG tablet Take 400 mg by mouth 2 (two) times daily.      Marland Kitchen ALPRAZolam (XANAX) 0.25 MG tablet Take 0.25 mg by mouth 3 (three) times daily as needed for sleep or anxiety.      . Alum & Mag Hydroxide-Simeth (MAGIC MOUTHWASH) SOLN Take 5 mLs by mouth as needed ([facility] formula--no Simethicone. For sores in mouth.).       Marland Kitchen benzonatate (TESSALON) 100 MG capsule Take 100 mg by mouth daily as needed. For cough.      . diphenoxylate-atropine (LOMOTIL) 2.5-0.025 MG per tablet Take 1 tablet by mouth 4 (four) times daily as needed for diarrhea or loose stools.      . DULoxetine (CYMBALTA) 30 MG capsule Take 30 mg by mouth every morning.       Marland Kitchen esomeprazole (NEXIUM) 40 MG capsule Take 40 mg by mouth every morning.       Marland Kitchen Hydrocodone-Acetaminophen 5-300 MG TABS Take 1-2 tablets by mouth every 8 (eight) hours as needed (pain).      . hydrOXYzine (ATARAX/VISTARIL) 25 MG tablet Take 25 mg by mouth 2 (two) times daily as needed for itching.      . levothyroxine (SYNTHROID, LEVOTHROID) 50 MCG tablet Take 50 mcg by mouth every morning.       Marland Kitchen LORazepam (ATIVAN) 0.5 MG tablet Take 0.5 mg by mouth every 8 (eight) hours as needed for anxiety.      Marland Kitchen olopatadine (PATANOL) 0.1 % ophthalmic solution Place 1 drop into both eyes 2 (two) times daily as needed (For allergies.).       Marland Kitchen ondansetron (ZOFRAN) 8 MG tablet Take 8 mg by mouth every 8 (eight) hours as needed for nausea. For nausea.      . pirbuterol (MAXAIR) 200 MCG/INH inhaler Inhale 1-2 puffs into the lungs every 12 (twelve) hours as needed. For wheezing or shortness of breath.      . promethazine (PHENERGAN) 25 MG tablet Take 12.5 mg by mouth every 6 (six) hours as needed (For nausea or vomiting.).      Marland Kitchen temazepam (RESTORIL) 30 MG capsule Take 30 mg by mouth at bedtime as needed for sleep.      Marland Kitchen tolterodine (DETROL LA) 4 MG 24 hr capsule Take 4 mg by  mouth daily.      . Vitamin D, Ergocalciferol, (DRISDOL) 50000 UNITS CAPS Take 50,000 Units by mouth every Monday.      . [DISCONTINUED] potassium chloride (KLOR-CON) 10 MEQ CR tablet Take 10 mEq by mouth daily.         No current facility-administered medications on file prior to visit.   Assessment/Plan: This is a pleasant 67 year old white female with the following issues:  #1.  Refractory kappa light chain myeloma.  She was diagnosed with myeloma about 24 years ago.  Overall she has done incredibly well with this.  We are currently trying to get her into a clinical trial at [facility].  In the meantime, she is on Cytoxan/Kyprolis/dexamethasone.  So far, she is tolerating this well without any major toxicity.  #2.  Anemia.  This is secondary to #1.  We will go ahead and transfuse her with 2 units of packed red blood cells tomorrow.  She is symptomatic.  She does receive Aranesp 300 mg as needed for hemoglobin less than 10.  Her last Aranesp was on 02/15/2013  #3.  Supportive therapy.  He receives Zometa 3.3 mg IV monthly.  Her last Zometa was on the first 2014.  #4.  IVIG.  This is monthly.  This was last given on 02/15/2013.  #5.  Followup.  We will follow back up with Ms. Arthor Captain in 3 weeks but before then should there be questions or concerns.

## 2013-03-02 ENCOUNTER — Other Ambulatory Visit: Payer: Self-pay | Admitting: Hematology & Oncology

## 2013-03-02 ENCOUNTER — Ambulatory Visit: Payer: Medicare Other

## 2013-03-02 ENCOUNTER — Ambulatory Visit (HOSPITAL_BASED_OUTPATIENT_CLINIC_OR_DEPARTMENT_OTHER): Payer: Medicare Other

## 2013-03-02 VITALS — BP 122/65 | HR 80 | Temp 98.0°F | Resp 16

## 2013-03-02 DIAGNOSIS — Z5112 Encounter for antineoplastic immunotherapy: Secondary | ICD-10-CM

## 2013-03-02 DIAGNOSIS — I82211 Chronic embolism and thrombosis of superior vena cava: Secondary | ICD-10-CM

## 2013-03-02 DIAGNOSIS — C9002 Multiple myeloma in relapse: Secondary | ICD-10-CM

## 2013-03-02 DIAGNOSIS — D631 Anemia in chronic kidney disease: Secondary | ICD-10-CM

## 2013-03-02 DIAGNOSIS — C9000 Multiple myeloma not having achieved remission: Secondary | ICD-10-CM

## 2013-03-02 MED ORDER — ONDANSETRON 8 MG/50ML IVPB (CHCC)
8.0000 mg | Freq: Once | INTRAVENOUS | Status: AC
  Administered 2013-03-02: 8 mg via INTRAVENOUS

## 2013-03-02 MED ORDER — SODIUM CHLORIDE 0.9 % IJ SOLN
10.0000 mL | INTRAMUSCULAR | Status: DC | PRN
  Administered 2013-03-02: 10 mL
  Filled 2013-03-02: qty 10

## 2013-03-02 MED ORDER — SODIUM CHLORIDE 0.9 % IV SOLN
250.0000 mL | Freq: Once | INTRAVENOUS | Status: AC
  Administered 2013-03-02: 250 mL via INTRAVENOUS

## 2013-03-02 MED ORDER — SODIUM CHLORIDE 0.9 % IJ SOLN
3.0000 mL | INTRAMUSCULAR | Status: DC | PRN
  Filled 2013-03-02: qty 10

## 2013-03-02 MED ORDER — SODIUM CHLORIDE 0.9 % IV SOLN: Freq: Once | INTRAVENOUS | Status: DC

## 2013-03-02 MED ORDER — DIPHENHYDRAMINE HCL 25 MG PO CAPS
25.0000 mg | ORAL_CAPSULE | Freq: Once | ORAL | Status: AC
  Administered 2013-03-02: 25 mg via ORAL

## 2013-03-02 MED ORDER — SODIUM CHLORIDE 0.9 % IJ SOLN
10.0000 mL | INTRAMUSCULAR | Status: DC | PRN
  Filled 2013-03-02: qty 10

## 2013-03-02 MED ORDER — WARFARIN SODIUM 2 MG PO TABS: 2.0000 mg | ORAL_TABLET | Freq: Every day | ORAL | Status: DC

## 2013-03-02 MED ORDER — DEXTROSE 5 % IV SOLN
20.0000 mg/m2 | Freq: Once | INTRAVENOUS | Status: AC
  Administered 2013-03-02: 30 mg via INTRAVENOUS
  Filled 2013-03-02: qty 15

## 2013-03-02 MED ORDER — LORAZEPAM 2 MG/ML IJ SOLN: 0.5000 mg | Freq: Once | INTRAMUSCULAR | Status: DC

## 2013-03-02 MED ORDER — HEPARIN SOD (PORK) LOCK FLUSH 100 UNIT/ML IV SOLN
500.0000 [IU] | Freq: Once | INTRAVENOUS | Status: AC
  Administered 2013-03-02: 250 [IU] via INTRAVENOUS
  Filled 2013-03-02: qty 5

## 2013-03-02 MED ORDER — ACETAMINOPHEN 325 MG PO TABS
650.0000 mg | ORAL_TABLET | Freq: Once | ORAL | Status: AC
  Administered 2013-03-02: 650 mg via ORAL

## 2013-03-02 NOTE — Progress Notes (Signed)
I am going to start [name] on a low dose Coumadin. 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 Radiology cannot put another port and because of this. She has a PICC line in. I think that low dose Coumadin will be effective in maintaining the PICC line and hopefully also try to improve blood flow through the superior vena cava system.  She has Naprosyn listed on her med list. She only takes is as needed I do not think this would be a problem.   We will monitor her INR was she comes in for treatments.  I do not anticipate any bleeding issues with this. I know she has low platelets but the Coumadin should not interfere with this.  Cindee Lame

## 2013-03-02 NOTE — Patient Instructions (Addendum)
Blood Transfusion Information WHAT IS A BLOOD TRANSFUSION? A transfusion is the replacement of blood or some of its parts. Blood is made up of multiple cells which provide different functions.  Red blood cells carry oxygen and are used for blood loss replacement.  White blood cells fight against infection.  Platelets control bleeding.  Plasma helps clot blood.  Other blood products are available for specialized needs, such as hemophilia or other clotting disorders. BEFORE THE TRANSFUSION  Who gives blood for transfusions?   You may be able to donate blood to be used at a later date on yourself (autologous donation).  Relatives can be asked to donate blood. This is generally not any safer than if you have received blood from a stranger. The same precautions are taken to ensure safety when a relative's blood is donated.  Healthy volunteers who are fully evaluated to make sure their blood is safe. This is blood bank blood. Transfusion therapy is the safest it has ever been in the practice of medicine. Before blood is taken from a donor, a complete history is taken to make sure that person has no history of diseases nor engages in risky social behavior (examples are intravenous drug use or sexual activity with multiple partners). The donor's travel history is screened to minimize risk of transmitting infections, such as malaria. The donated blood is tested for signs of infectious diseases, such as HIV and hepatitis. The blood is then tested to be sure it is compatible with you in order to minimize the chance of a transfusion reaction. If you or a relative donates blood, this is often done in anticipation of surgery and is not appropriate for emergency situations. It takes many days to process the donated blood. RISKS AND COMPLICATIONS Although transfusion therapy is very safe and saves many lives, the main dangers of transfusion include:   Getting an infectious disease.  Developing a  transfusion reaction. This is an allergic reaction to something in the blood you were given. Every precaution is taken to prevent this. The decision to have a blood transfusion has been considered carefully by your caregiver before blood is given. Blood is not given unless the benefits outweigh the risks. AFTER THE TRANSFUSION  Right after receiving a blood transfusion, you will usually feel much better and more energetic. This is especially true if your red blood cells have gotten low (anemic). The transfusion raises the level of the red blood cells which carry oxygen, and this usually causes an energy increase.  The nurse administering the transfusion will monitor you carefully for complications. HOME CARE INSTRUCTIONS  No special instructions are needed after a transfusion. You may find your energy is better. Speak with your caregiver about any limitations on activity for underlying diseases you may have. SEEK MEDICAL CARE IF:   Your condition is not improving after your transfusion.  You develop redness or irritation at the intravenous (IV) site. SEEK IMMEDIATE MEDICAL CARE IF:  Any of the following symptoms occur over the next 12 hours:  Shaking chills.  You have a temperature by mouth above 102 F (38.9 C), not controlled by medicine.  Chest, back, or muscle pain.  People around you feel you are not acting correctly or are confused.  Shortness of breath or difficulty breathing.  Dizziness and fainting.  You get a rash or develop hives.  You have a decrease in urine output.  Your urine turns a dark color or changes to pink, red, or brown. Any of the following   symptoms occur over the next 10 days:  You have a temperature by mouth above 102 F (38.9 C), not controlled by medicine.  Shortness of breath.  Weakness after normal activity.  The white part of the eye turns yellow (jaundice).  You have a decrease in the amount of urine or are urinating less often.  Your  urine turns a dark color or changes to pink, red, or brown. Document Released: 10/01/2000 Document Revised: 12/27/2011 Document Reviewed: 05/20/2008 [employer] Patient Information 2013 [employer].  

## 2013-03-04 LAB — TYPE AND SCREEN
ABO/RH(D): A NEG
Antibody Screen: NEGATIVE
Unit division: 0

## 2013-03-05 ENCOUNTER — Telehealth: Payer: Self-pay | Admitting: Hematology & Oncology

## 2013-03-05 NOTE — Telephone Encounter (Signed)
Per MD ok for pt to get tx 6-3 and 4

## 2013-03-07 ENCOUNTER — Ambulatory Visit: Payer: Medicare Other

## 2013-03-07 ENCOUNTER — Other Ambulatory Visit (HOSPITAL_BASED_OUTPATIENT_CLINIC_OR_DEPARTMENT_OTHER): Payer: Medicare Other | Admitting: Lab

## 2013-03-07 ENCOUNTER — Other Ambulatory Visit: Payer: Self-pay | Admitting: Hematology & Oncology

## 2013-03-07 ENCOUNTER — Other Ambulatory Visit: Payer: Medicare Other | Admitting: Lab

## 2013-03-07 DIAGNOSIS — C9000 Multiple myeloma not having achieved remission: Secondary | ICD-10-CM

## 2013-03-07 LAB — CBC WITH DIFFERENTIAL (CANCER CENTER ONLY)
BASO#: 0 10*3/uL (ref 0.0–0.2)
EOS%: 3.5 % (ref 0.0–7.0)
Eosinophils Absolute: 0.1 10*3/uL (ref 0.0–0.5)
HGB: 10.3 g/dL — ABNORMAL LOW (ref 11.6–15.9)
LYMPH%: 11.2 % — ABNORMAL LOW (ref 14.0–48.0)
MCH: 32.3 pg (ref 26.0–34.0)
MCHC: 31.8 g/dL — ABNORMAL LOW (ref 32.0–36.0)
MCV: 102 fL — ABNORMAL HIGH (ref 81–101)
MONO%: 10.9 % (ref 0.0–13.0)
NEUT#: 2.7 10*3/uL (ref 1.5–6.5)
RBC: 3.19 10*6/uL — ABNORMAL LOW (ref 3.70–5.32)

## 2013-03-08 ENCOUNTER — Other Ambulatory Visit: Payer: Medicare Other | Admitting: Lab

## 2013-03-08 ENCOUNTER — Ambulatory Visit: Payer: Medicare Other

## 2013-03-14 ENCOUNTER — Telehealth: Payer: Self-pay | Admitting: *Deleted

## 2013-03-14 DIAGNOSIS — C9000 Multiple myeloma not having achieved remission: Secondary | ICD-10-CM

## 2013-03-14 DIAGNOSIS — R197 Diarrhea, unspecified: Secondary | ICD-10-CM

## 2013-03-14 MED ORDER — PROMETHAZINE HCL 12.5 MG PO TABS: 12.5000 mg | ORAL_TABLET | Freq: Four times a day (QID) | ORAL | Status: DC | PRN

## 2013-03-14 NOTE — Telephone Encounter (Signed)
Pt called requesting something to be called in for abd pain from n/v since 0530 this morning. She stated it was in her mid abdomen. Wasn't having diarrhea at the beginning of the conversation but did towards the end. Asked if she felt chilled or if she had a fever and she has last

## 2013-03-16 ENCOUNTER — Other Ambulatory Visit: Payer: Medicare Other | Admitting: Lab

## 2013-03-16 ENCOUNTER — Ambulatory Visit: Payer: Medicare Other

## 2013-03-16 ENCOUNTER — Ambulatory Visit: Payer: Medicare Other | Admitting: Hematology & Oncology

## 2013-03-20 ENCOUNTER — Encounter (HOSPITAL_COMMUNITY)
Admission: RE | Admit: 2013-03-20 | Discharge: 2013-03-20 | Disposition: A | Discharge status: Home / Self Care | Payer: Medicare Other | Source: Ambulatory Visit | Attending: Hematology & Oncology | Admitting: Hematology & Oncology

## 2013-03-20 ENCOUNTER — Ambulatory Visit (HOSPITAL_BASED_OUTPATIENT_CLINIC_OR_DEPARTMENT_OTHER): Payer: Medicare Other

## 2013-03-20 ENCOUNTER — Other Ambulatory Visit (HOSPITAL_BASED_OUTPATIENT_CLINIC_OR_DEPARTMENT_OTHER): Payer: Medicare Other | Admitting: Lab

## 2013-03-20 ENCOUNTER — Ambulatory Visit (HOSPITAL_BASED_OUTPATIENT_CLINIC_OR_DEPARTMENT_OTHER): Payer: Medicare Other | Admitting: Hematology & Oncology

## 2013-03-20 VITALS — BP 115/46 | HR 96 | Temp 98.6°F | Resp 16 | Ht 59.0 in | Wt 120.0 lb

## 2013-03-20 VITALS — BP 107/49 | HR 87 | Temp 98.1°F | Resp 18

## 2013-03-20 DIAGNOSIS — Z5111 Encounter for antineoplastic chemotherapy: Secondary | ICD-10-CM

## 2013-03-20 DIAGNOSIS — N189 Chronic kidney disease, unspecified: Secondary | ICD-10-CM

## 2013-03-20 DIAGNOSIS — D696 Thrombocytopenia, unspecified: Secondary | ICD-10-CM

## 2013-03-20 DIAGNOSIS — N289 Disorder of kidney and ureter, unspecified: Secondary | ICD-10-CM

## 2013-03-20 DIAGNOSIS — C9000 Multiple myeloma not having achieved remission: Secondary | ICD-10-CM

## 2013-03-20 DIAGNOSIS — D649 Anemia, unspecified: Secondary | ICD-10-CM

## 2013-03-20 DIAGNOSIS — Z5112 Encounter for antineoplastic immunotherapy: Secondary | ICD-10-CM

## 2013-03-20 DIAGNOSIS — C9002 Multiple myeloma in relapse: Secondary | ICD-10-CM | POA: Insufficient documentation

## 2013-03-20 LAB — CBC WITH DIFFERENTIAL (CANCER CENTER ONLY)
BASO%: 0.5 % (ref 0.0–2.0)
EOS%: 4.2 % (ref 0.0–7.0)
LYMPH%: 15.4 % (ref 14.0–48.0)
MCH: 33.1 pg (ref 26.0–34.0)
MCHC: 32.6 g/dL (ref 32.0–36.0)
MCV: 102 fL — ABNORMAL HIGH (ref 81–101)
MONO%: 16.4 % — ABNORMAL HIGH (ref 0.0–13.0)
NEUT#: 2.4 10*3/uL (ref 1.5–6.5)
Platelets: 68 10*3/uL — ABNORMAL LOW (ref 145–400)
RBC: 2.69 10*6/uL — ABNORMAL LOW (ref 3.70–5.32)
RDW: 22.8 % — ABNORMAL HIGH (ref 11.1–15.7)

## 2013-03-20 LAB — TECHNOLOGIST REVIEW CHCC SATELLITE

## 2013-03-20 MED ORDER — HEPARIN SOD (PORK) LOCK FLUSH 100 UNIT/ML IV SOLN
250.0000 [IU] | Freq: Once | INTRAVENOUS | Status: DC | PRN
  Filled 2013-03-20: qty 5

## 2013-03-20 MED ORDER — SODIUM CHLORIDE 0.9 % IV SOLN: Freq: Once | INTRAVENOUS | Status: AC

## 2013-03-20 MED ORDER — SODIUM CHLORIDE 0.9 % IV SOLN: Freq: Once | INTRAVENOUS | Status: DC

## 2013-03-20 MED ORDER — SODIUM CHLORIDE 0.9 % IJ SOLN
3.0000 mL | INTRAMUSCULAR | Status: DC | PRN
  Filled 2013-03-20: qty 10

## 2013-03-20 MED ORDER — SODIUM CHLORIDE 0.9 % IV SOLN: 300.0000 mg/m2 | Freq: Once | INTRAVENOUS | Status: DC

## 2013-03-20 MED ORDER — SODIUM CHLORIDE 0.9 % IV SOLN
240.0000 mg/m2 | Freq: Once | INTRAVENOUS | Status: AC
  Administered 2013-03-20: 360 mg via INTRAVENOUS
  Filled 2013-03-20: qty 18

## 2013-03-20 MED ORDER — DEXTROSE 5 % IV SOLN
27.0000 mg/m2 | Freq: Once | INTRAVENOUS | Status: AC
  Administered 2013-03-20: 40 mg via INTRAVENOUS
  Filled 2013-03-20: qty 20

## 2013-03-20 MED ORDER — DEXAMETHASONE SODIUM PHOSPHATE 20 MG/5ML IJ SOLN
40.0000 mg | Freq: Once | INTRAMUSCULAR | Status: AC
  Administered 2013-03-20: 40 mg via INTRAVENOUS
  Filled 2013-03-20: qty 10

## 2013-03-20 MED ORDER — IMMUNE GLOBULIN (HUMAN) 20 GM/200ML IV SOLN
0.7000 g/kg | Freq: Once | INTRAVENOUS | Status: AC
  Administered 2013-03-20: 40 g via INTRAVENOUS
  Filled 2013-03-20: qty 400

## 2013-03-20 MED ORDER — SODIUM CHLORIDE 0.9 % IJ SOLN
10.0000 mL | INTRAMUSCULAR | Status: DC | PRN
  Filled 2013-03-20: qty 10

## 2013-03-20 MED ORDER — ZOLEDRONIC ACID 4 MG/5ML IV CONC
3.3000 mg | Freq: Once | INTRAVENOUS | Status: AC
  Administered 2013-03-20: 3.3 mg via INTRAVENOUS
  Filled 2013-03-20: qty 4.13

## 2013-03-20 MED ORDER — ACETAMINOPHEN 325 MG PO TABS
650.0000 mg | ORAL_TABLET | Freq: Once | ORAL | Status: AC
  Administered 2013-03-20: 650 mg via ORAL

## 2013-03-20 MED ORDER — HEPARIN SOD (PORK) LOCK FLUSH 100 UNIT/ML IV SOLN
500.0000 [IU] | Freq: Once | INTRAVENOUS | Status: DC | PRN
  Filled 2013-03-20: qty 5

## 2013-03-20 MED ORDER — DIPHENHYDRAMINE HCL 25 MG PO CAPS
25.0000 mg | ORAL_CAPSULE | Freq: Once | ORAL | Status: AC
  Administered 2013-03-20: 25 mg via ORAL

## 2013-03-20 MED ORDER — ALTEPLASE 2 MG IJ SOLR
2.0000 mg | Freq: Once | INTRAMUSCULAR | Status: DC | PRN
  Filled 2013-03-20: qty 2

## 2013-03-20 MED ORDER — ONDANSETRON 8 MG/50ML IVPB (CHCC)
8.0000 mg | Freq: Once | INTRAVENOUS | Status: AC
  Administered 2013-03-20: 8 mg via INTRAVENOUS

## 2013-03-20 MED ORDER — DARBEPOETIN ALFA-POLYSORBATE 300 MCG/0.6ML IJ SOLN
300.0000 ug | Freq: Once | INTRAMUSCULAR | Status: AC
  Administered 2013-03-20: 300 ug via SUBCUTANEOUS

## 2013-03-20 NOTE — Patient Instructions (Addendum)
Immune Globulin Injection What is this medicine? IMMUNE GLOBULIN (im MUNE GLOB yoo lin) helps to prevent or reduce the severity of certain infections in patients who are at risk. This medicine is collected from the pooled blood of many donors. It is used to treat immune system problems, thrombocytopenia, and Kawasaki syndrome. This medicine may be used for other purposes; ask your health care provider or pharmacist if you have questions. What should I tell my health care provider before I take this medicine? They need to know if you have any of these conditions: - diabetes - extremely low or no immune antibodies in the blood - heart disease - history of blood clots - hyperprolinemia - infection in the blood, sepsis - kidney disease - taking medicine that may change kidney function - ask your health care provider about your medicine - an unusual or allergic reaction to human immune globulin, albumin, maltose, sucrose, polysorbate 80, other medicines, foods, dyes, or preservatives - pregnant or trying to get pregnant - breast-feeding How should I use this medicine? This medicine is for injection into a muscle or infusion into a vein or skin. It is usually given by a health care professional in a hospital or clinic setting. In rare cases, some brands of this medicine might be given at home. You will be taught how to give this medicine. Use exactly as directed. Take your medicine at regular intervals. Do not take your medicine more often than directed. Talk to your pediatrician regarding the use of this medicine in children. Special care may be needed. Overdosage: If you think you have taken too much of this medicine contact a poison control center or emergency room at once. NOTE: This medicine is only for you. Do not share this medicine with others. What if I miss a dose? It is important not to miss your dose. Call your doctor or health care professional if you are unable to keep an  appointment. If you give yourself the medicine and you miss a dose, take it as soon as you can. If it is almost time for your next dose, take only that dose. Do not take double or extra doses. What may interact with this medicine? -aspirin and aspirin-like medicines -cisplatin -cyclosporine -medicines for infection like acyclovir, adefovir, amphotericin B, bacitracin, cidofovir, foscarnet, ganciclovir, gentamicin, pentamidine, vancomycin -NSAIDS, medicines for pain and inflammation, like ibuprofen or naproxen -pamidronate -vaccines -zoledronic acid This list may not describe all possible interactions. Give your health care provider a list of all the medicines, herbs, non-prescription drugs, or dietary supplements you use. Also tell them if you smoke, drink alcohol, or use illegal drugs. Some items may interact with your medicine. What should I watch for while using this medicine? Your condition will be monitored carefully while you are receiving this medicine. This medicine is made from pooled blood donations of many different people. It may be possible to pass an infection in this medicine. However, the donors are screened for infections and all products are tested for HIV and hepatitis. The medicine is treated to kill most or all bacteria and viruses. Talk to your doctor about the risks and benefits of this medicine. Do not have vaccinations for at least 14 days before, or until at least 3 months after receiving this medicine. What side effects may I notice from receiving this medicine? Side effects that you should report to your doctor or health care professional as soon as possible: -allergic reactions like skin rash, itching or hives, swelling of   the face, lips, or tongue -breathing problems -chest pain or tightness -fever, chills -headache with nausea, vomiting -neck pain or difficulty moving neck -pain when moving eyes -pain, swelling, warmth in the leg -problems with balance,  talking, walking -sudden weight gain -swelling of the ankles, feet, hands -trouble passing urine or change in the amount of urine Side effects that usually do not require medical attention (report to your doctor or health care professional if they continue or are bothersome): -dizzy, drowsy -flushing -increased sweating -leg cramps -muscle aches and pains -pain at site where injected This list may not describe all possible side effects. Call your doctor for medical advice about side effects. You may report side effects to FDA at 1-800-FDA-1088. Where should I keep my medicine? Keep out of the reach of children. This drug is usually given in a hospital or clinic and will not be stored at home. In rare cases, some brands of this medicine may be given at home. If you are using this medicine at home, you will be instructed on how to store this medicine. Throw away any unused medicine after the expiration date on the label. NOTE: This sheet is a summary. It may not cover all possible information. If you have questions about this medicine, talk to your doctor, pharmacist, or health care provider.  2012, Elsevier/Gold Standard. (12/25/2008 11:44:49 AM)Zoledronic Acid injection (Hypercalcemia, Oncology) What is this medicine? ZOLEDRONIC ACID (ZOE le dron ik AS id) lowers the amount of calcium loss from bone. It is used to treat too much calcium in your blood from cancer. It is also used to prevent complications of cancer that has spread to the bone. This medicine may be used for other purposes; ask your health care provider or pharmacist if you have questions. What should I tell my health care provider before I take this medicine? They need to know if you have any of these conditions: -aspirin-sensitive asthma -dental disease -kidney disease -an unusual or allergic reaction to zoledronic acid, other medicines, foods, dyes, or preservatives -pregnant or trying to get pregnant -breast-feeding How  should I use this medicine? This medicine is for infusion into a vein. It is given by a health care professional in a hospital or clinic setting. Talk to your pediatrician regarding the use of this medicine in children. Special care may be needed. Overdosage: If you think you have taken too much of this medicine contact a poison control center or emergency room at once. NOTE: This medicine is only for you. Do not share this medicine with others. What if I miss a dose? It is important not to miss your dose. Call your doctor or health care professional if you are unable to keep an appointment. What may interact with this medicine? -certain antibiotics given by injection -NSAIDs, medicines for pain and inflammation, like ibuprofen or naproxen -some diuretics like bumetanide, furosemide -teriparatide -thalidomide This list may not describe all possible interactions. Give your health care provider a list of all the medicines, herbs, non-prescription drugs, or dietary supplements you use. Also tell them if you smoke, drink alcohol, or use illegal drugs. Some items may interact with your medicine. What should I watch for while using this medicine? Visit your doctor or health care professional for regular checkups. It may be some time before you see the benefit from this medicine. Do not stop taking your medicine unless your doctor tells you to. Your doctor may order blood tests or other tests to see how you are doing. Women  should inform their doctor if they wish to become pregnant or think they might be pregnant. There is a potential for serious side effects to an unborn child. Talk to your health care professional or pharmacist for more information. You should make sure that you get enough calcium and vitamin D while you are taking this medicine. Discuss the foods you eat and the vitamins you take with your health care professional. Some people who take this medicine have severe bone, joint, and/or  muscle pain. This medicine may also increase your risk for a broken thigh bone. Tell your doctor right away if you have pain in your upper leg or groin. Tell your doctor if you have any pain that does not go away or that gets worse. What side effects may I notice from receiving this medicine? Side effects that you should report to your doctor or health care professional as soon as possible: -allergic reactions like skin rash, itching or hives, swelling of the face, lips, or tongue -anxiety, confusion, or depression -breathing problems -changes in vision -feeling faint or lightheaded, falls -jaw burning, cramping, pain -muscle cramps, stiffness, or weakness -trouble passing urine or change in the amount of urine Side effects that usually do not require medical attention (report to your doctor or health care professional if they continue or are bothersome): -bone, joint, or muscle pain -fever -hair loss -irritation at site where injected -loss of appetite -nausea, vomiting -stomach upset -tired This list may not describe all possible side effects. Call your doctor for medical advice about side effects. You may report side effects to FDA at 1-800-FDA-1088. Where should I keep my medicine? This drug is given in a hospital or clinic and will not be stored at home. NOTE: This sheet is a summary. It may not cover all possible information. If you have questions about this medicine, talk to your doctor, pharmacist, or health care provider.  2012, Elsevier/Gold Standard. (04/02/2011 9:06:58 AM)

## 2013-03-20 NOTE — Progress Notes (Signed)
This office note has been dictated.

## 2013-03-21 ENCOUNTER — Ambulatory Visit (HOSPITAL_BASED_OUTPATIENT_CLINIC_OR_DEPARTMENT_OTHER): Payer: Medicare Other

## 2013-03-21 VITALS — BP 115/61 | HR 61 | Temp 97.9°F

## 2013-03-21 DIAGNOSIS — C9000 Multiple myeloma not having achieved remission: Secondary | ICD-10-CM

## 2013-03-21 DIAGNOSIS — Z5112 Encounter for antineoplastic immunotherapy: Secondary | ICD-10-CM

## 2013-03-21 DIAGNOSIS — D649 Anemia, unspecified: Secondary | ICD-10-CM

## 2013-03-21 DIAGNOSIS — D631 Anemia in chronic kidney disease: Secondary | ICD-10-CM

## 2013-03-21 MED ORDER — SODIUM CHLORIDE 0.9 % IJ SOLN
10.0000 mL | INTRAMUSCULAR | Status: AC | PRN
  Administered 2013-03-21: 10 mL
  Filled 2013-03-21: qty 10

## 2013-03-21 MED ORDER — HEPARIN SOD (PORK) LOCK FLUSH 100 UNIT/ML IV SOLN
250.0000 [IU] | Freq: Once | INTRAVENOUS | Status: AC | PRN
  Administered 2013-03-21: 250 [IU]
  Filled 2013-03-21: qty 5

## 2013-03-21 MED ORDER — FERUMOXYTOL INJECTION 510 MG/17 ML
510.0000 mg | Freq: Once | INTRAVENOUS | Status: AC
  Administered 2013-03-21: 510 mg via INTRAVENOUS
  Filled 2013-03-21: qty 17

## 2013-03-21 MED ORDER — ONDANSETRON 8 MG/50ML IVPB (CHCC)
8.0000 mg | Freq: Once | INTRAVENOUS | Status: AC
  Administered 2013-03-21: 8 mg via INTRAVENOUS

## 2013-03-21 MED ORDER — SODIUM CHLORIDE 0.9 % IV SOLN
Freq: Once | INTRAVENOUS | Status: AC
  Administered 2013-03-21: 12:00:00 via INTRAVENOUS

## 2013-03-21 MED ORDER — DEXTROSE 5 % IV SOLN
27.0000 mg/m2 | Freq: Once | INTRAVENOUS | Status: AC
  Administered 2013-03-21: 40 mg via INTRAVENOUS
  Filled 2013-03-21: qty 20

## 2013-03-21 NOTE — Patient Instructions (Addendum)
[facility] Discharge Instructions for Patients Receiving Chemotherapy  Today you received the following chemotherapy agents Kyprolis To help prevent nausea and vomiting after your treatment, we encourage you to take your nausea medication as prescribed.  If you develop nausea and vomiting that is not controlled by your nausea medication, call the clinic.   BELOW ARE SYMPTOMS THAT SHOULD BE REPORTED IMMEDIATELY:  *FEVER GREATER THAN 100.5 F  *CHILLS WITH OR WITHOUT FEVER  NAUSEA AND VOMITING THAT IS NOT CONTROLLED WITH YOUR NAUSEA MEDICATION  *UNUSUAL SHORTNESS OF BREATH  *UNUSUAL BRUISING OR BLEEDING  TENDERNESS IN MOUTH AND THROAT WITH OR WITHOUT PRESENCE OF ULCERS  *URINARY PROBLEMS  *BOWEL PROBLEMS  UNUSUAL RASH Items with * indicate a potential emergency and should be followed up as soon as possible.  Feel free to call the clinic you have any questions or concerns. The clinic phone number is [phone].    

## 2013-03-21 NOTE — Progress Notes (Signed)
DIAGNOSES: 1. Refractory kappa light chain myeloma. 2. 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  CURRENT THERAPY: 1. Cytoxan/Kyprolis/Decadron. 2. Aranesp 300 mcg subcu as needed for hemoglobin less than 10. 3. IVIG q.month. 4. Zometa 3.3 mg IV monthly.  INTERIM HISTORY:  [name] comes in for followup.  We have held her chemotherapy for a week or so.  When we saw her a couple weeks ago, her platelet count was 39,000.  She is feeling a bit better.  She has not had any problems with fever. She has had chronic pain issues.  We did do a PET scan on her, back we were working her up as part of clinical trial eligibility.  The PET scan did show some fractures involving the right ribs and right scapula.  There were no significant areas of uptake that we look like myeloma.  The PET scan actually looked better than I thought.  She really does not produce a lot of kappa light chain in her serum.  We are trying to follow this along.  Her last kappa light chain was 5.0 mg/dL.  She does have anemia.  She has had some bleeding in the past, but recently, there has been no bleeding.  She has had no rashes.  She has had no change in bowel or bladder habits.  PHYSICAL EXAMINATION:  General:  This is a well-developed, well- nourished white female in no obvious distress.  Vital signs: Temperature of 98.6, pulse 96, respiratory rate 16, blood pressure 115/46.  Weight is 120.  Head and neck:  Normocephalic, atraumatic skull.  There are no ocular or oral lesions.  There are no palpable cervical or supraclavicular lymph nodes.  Lungs:  Clear bilaterally. Cardiac:  Regular rate and rhythm with a normal S1, S2.  There are no murmurs, rubs or bruits.  Abdomen:  Soft with good bowel sounds.  There is no palpable abdominal mass.  There is no fluid wave.  No palpable hepatosplenomegaly is noted.  Back:  No tenderness over the spine, ribs, or hips.  Extremities shows changes in her hip,  secondary to past radiation.  LABORATORY STUDIES:  White cell count is 3.8, hemoglobin 8.9, hematocrit 27, platelet count 68,000.  IMPRESSION:  [name] is a 67 year old white female with refractory kappa light chain myeloma.  Again, we have her on chemotherapy to try to maintain her myeloma.  I am not sure whether or not she will be able to qualify for a clinical trial.  We will go ahead and treat her today.  I realize her platelet count is down a little bit.  She will also get her IVIG.  This has really helped with respect to minimize infection with her.  I want to try her on some IV iron.  Maybe, this will help improve her anemia along with the Aranesp.  She does get Zometa.  We will plan to follow up.    ______________________________ Josph Macho, M.D. PRE/MEDQ  D:  03/20/2013  T:  03/21/2013  Job:  [id number]

## 2013-03-22 LAB — COMPREHENSIVE METABOLIC PANEL
ALT: 65 U/L — ABNORMAL HIGH (ref 0–35)
AST: 35 U/L (ref 0–37)
Albumin: 3.8 g/dL (ref 3.5–5.2)
Alkaline Phosphatase: 117 U/L (ref 39–117)
BUN: 26 mg/dL — ABNORMAL HIGH (ref 6–23)
Chloride: 109 mEq/L (ref 96–112)
Potassium: 3.6 mEq/L (ref 3.5–5.3)
Sodium: 142 mEq/L (ref 135–145)

## 2013-03-22 LAB — IGG, IGA, IGM
IgA: <6 mg/dL — ABNORMAL LOW (ref 69–380)
IgG (Immunoglobin G), Serum: 674 mg/dL — ABNORMAL LOW (ref 690–1700)

## 2013-03-22 LAB — KAPPA/LAMBDA LIGHT CHAINS
Kappa free light chain: 5.8 mg/dL — ABNORMAL HIGH (ref 0.33–1.94)
Kappa:Lambda Ratio: 193.33 — ABNORMAL HIGH (ref 0.26–1.65)
Lambda Free Lght Chn: 0.03 mg/dL — ABNORMAL LOW (ref 0.57–2.63)

## 2013-03-22 LAB — IRON AND TIBC: UIBC: 54 ug/dL — ABNORMAL LOW (ref 125–400)

## 2013-03-27 ENCOUNTER — Other Ambulatory Visit (HOSPITAL_BASED_OUTPATIENT_CLINIC_OR_DEPARTMENT_OTHER): Payer: Medicare Other | Admitting: Lab

## 2013-03-27 ENCOUNTER — Ambulatory Visit (HOSPITAL_BASED_OUTPATIENT_CLINIC_OR_DEPARTMENT_OTHER): Payer: Medicare Other

## 2013-03-27 VITALS — BP 110/63 | HR 90 | Temp 96.8°F

## 2013-03-27 DIAGNOSIS — R197 Diarrhea, unspecified: Secondary | ICD-10-CM

## 2013-03-27 DIAGNOSIS — Z5112 Encounter for antineoplastic immunotherapy: Secondary | ICD-10-CM

## 2013-03-27 DIAGNOSIS — C9000 Multiple myeloma not having achieved remission: Secondary | ICD-10-CM

## 2013-03-27 LAB — CBC WITH DIFFERENTIAL (CANCER CENTER ONLY)
BASO%: 0.2 % (ref 0.0–2.0)
LYMPH%: 18.3 % (ref 14.0–48.0)
MCH: 33 pg (ref 26.0–34.0)
MCV: 104 fL — ABNORMAL HIGH (ref 81–101)
MONO#: 0.6 10*3/uL (ref 0.1–0.9)
MONO%: 12.8 % (ref 0.0–13.0)
Platelets: 38 10*3/uL — ABNORMAL LOW (ref 145–400)
RDW: 24.4 % — ABNORMAL HIGH (ref 11.1–15.7)
WBC: 4.5 10*3/uL (ref 3.9–10.0)

## 2013-03-27 MED ORDER — ACETAMINOPHEN 325 MG PO TABS
650.0000 mg | ORAL_TABLET | Freq: Once | ORAL | Status: AC
  Administered 2013-03-27: 650 mg via ORAL

## 2013-03-27 MED ORDER — SODIUM CHLORIDE 0.9 % IV SOLN
Freq: Once | INTRAVENOUS | Status: AC
  Administered 2013-03-27: 12:00:00 via INTRAVENOUS

## 2013-03-27 MED ORDER — LORAZEPAM 0.5 MG PO TABS: 0.5000 mg | ORAL_TABLET | Freq: Once | ORAL | Status: DC

## 2013-03-27 MED ORDER — ONDANSETRON 8 MG/50ML IVPB (CHCC)
8.0000 mg | Freq: Once | INTRAVENOUS | Status: AC
  Administered 2013-03-27: 8 mg via INTRAVENOUS

## 2013-03-27 MED ORDER — PROMETHAZINE HCL 25 MG/ML IJ SOLN
12.5000 mg | Freq: Once | INTRAMUSCULAR | Status: AC
  Administered 2013-03-27: 11:00:00 via INTRAVENOUS

## 2013-03-27 MED ORDER — SODIUM CHLORIDE 0.9 % IV SOLN: Freq: Once | INTRAVENOUS | Status: DC

## 2013-03-27 MED ORDER — DEXTROSE 5 % IV SOLN
20.0000 mg/m2 | Freq: Once | INTRAVENOUS | Status: AC
  Administered 2013-03-27: 30 mg via INTRAVENOUS
  Filled 2013-03-27: qty 15

## 2013-03-27 MED ORDER — DEXAMETHASONE SODIUM PHOSPHATE 20 MG/5ML IJ SOLN
40.0000 mg | Freq: Once | INTRAMUSCULAR | Status: AC
  Administered 2013-03-27: 40 mg via INTRAVENOUS
  Filled 2013-03-27: qty 10

## 2013-03-27 NOTE — Patient Instructions (Addendum)
[facility] Discharge Instructions for Patients Receiving Chemotherapy  Today you received the following chemotherapy agents Kyprolis.  To help prevent nausea and vomiting after your treatment, we encourage you to take your nausea medication.   If you develop nausea and vomiting that is not controlled by your nausea medication, call the clinic.   BELOW ARE SYMPTOMS THAT SHOULD BE REPORTED IMMEDIATELY:  *FEVER GREATER THAN 100.5 F  *CHILLS WITH OR WITHOUT FEVER  NAUSEA AND VOMITING THAT IS NOT CONTROLLED WITH YOUR NAUSEA MEDICATION  *UNUSUAL SHORTNESS OF BREATH  *UNUSUAL BRUISING OR BLEEDING  TENDERNESS IN MOUTH AND THROAT WITH OR WITHOUT PRESENCE OF ULCERS  *URINARY PROBLEMS  *BOWEL PROBLEMS  UNUSUAL RASH Items with * indicate a potential emergency and should be followed up as soon as possible.  Feel free to call the clinic you have any questions or concerns. The clinic phone number is [phone].    

## 2013-03-28 ENCOUNTER — Ambulatory Visit (HOSPITAL_BASED_OUTPATIENT_CLINIC_OR_DEPARTMENT_OTHER): Payer: Medicare Other

## 2013-03-28 ENCOUNTER — Encounter: Payer: Self-pay | Admitting: Hematology & Oncology

## 2013-03-28 VITALS — BP 109/61 | HR 89 | Temp 99.3°F

## 2013-03-28 DIAGNOSIS — C9000 Multiple myeloma not having achieved remission: Secondary | ICD-10-CM

## 2013-03-28 DIAGNOSIS — Z5112 Encounter for antineoplastic immunotherapy: Secondary | ICD-10-CM

## 2013-03-28 MED ORDER — SODIUM CHLORIDE 0.9 % IV SOLN: Freq: Once | INTRAVENOUS | Status: DC

## 2013-03-28 MED ORDER — SODIUM CHLORIDE 0.9 % IV SOLN
Freq: Once | INTRAVENOUS | Status: AC
  Administered 2013-03-28: 13:00:00 via INTRAVENOUS

## 2013-03-28 MED ORDER — SODIUM CHLORIDE 0.9 % IJ SOLN
3.0000 mL | INTRAMUSCULAR | Status: DC | PRN
Start: 2013-03-28 — End: 2013-03-28
  Filled 2013-03-28: qty 10

## 2013-03-28 MED ORDER — SODIUM CHLORIDE 0.9 % IJ SOLN
10.0000 mL | INTRAMUSCULAR | Status: DC | PRN
  Administered 2013-03-28: 10 mL
  Filled 2013-03-28: qty 10

## 2013-03-28 MED ORDER — CARFILZOMIB CHEMO INJECTION 60 MG
20.0000 mg/m2 | Freq: Once | INTRAVENOUS | Status: AC
  Administered 2013-03-28: 30 mg via INTRAVENOUS
  Filled 2013-03-28: qty 15

## 2013-03-28 MED ORDER — ACETAMINOPHEN 325 MG PO TABS
650.0000 mg | ORAL_TABLET | Freq: Once | ORAL | Status: AC
  Administered 2013-03-28: 650 mg via ORAL

## 2013-03-28 MED ORDER — ONDANSETRON 8 MG/50ML IVPB (CHCC)
8.0000 mg | Freq: Once | INTRAVENOUS | Status: AC
  Administered 2013-03-28: 8 mg via INTRAVENOUS

## 2013-03-28 NOTE — Patient Instructions (Signed)
[facility] Discharge Instructions for Patients Receiving Chemotherapy  Today you received the following chemotherapy agents Kyprolis.  To help prevent nausea and vomiting after your treatment, we encourage you to take your nausea medication.   If you develop nausea and vomiting that is not controlled by your nausea medication, call the clinic.   BELOW ARE SYMPTOMS THAT SHOULD BE REPORTED IMMEDIATELY:  *FEVER GREATER THAN 100.5 F  *CHILLS WITH OR WITHOUT FEVER  NAUSEA AND VOMITING THAT IS NOT CONTROLLED WITH YOUR NAUSEA MEDICATION  *UNUSUAL SHORTNESS OF BREATH  *UNUSUAL BRUISING OR BLEEDING  TENDERNESS IN MOUTH AND THROAT WITH OR WITHOUT PRESENCE OF ULCERS  *URINARY PROBLEMS  *BOWEL PROBLEMS  UNUSUAL RASH Items with * indicate a potential emergency and should be followed up as soon as possible.  Feel free to call the clinic you have any questions or concerns. The clinic phone number is [phone].    

## 2013-04-02 ENCOUNTER — Other Ambulatory Visit: Payer: Self-pay | Admitting: Hematology & Oncology

## 2013-04-03 ENCOUNTER — Other Ambulatory Visit: Payer: Self-pay | Admitting: Medical

## 2013-04-03 ENCOUNTER — Other Ambulatory Visit (HOSPITAL_BASED_OUTPATIENT_CLINIC_OR_DEPARTMENT_OTHER): Payer: Medicare Other | Admitting: Lab

## 2013-04-03 ENCOUNTER — Ambulatory Visit (HOSPITAL_BASED_OUTPATIENT_CLINIC_OR_DEPARTMENT_OTHER): Payer: Medicare Other

## 2013-04-03 VITALS — BP 106/58 | HR 103 | Temp 98.0°F | Resp 20

## 2013-04-03 DIAGNOSIS — C9002 Multiple myeloma in relapse: Secondary | ICD-10-CM

## 2013-04-03 DIAGNOSIS — C9000 Multiple myeloma not having achieved remission: Secondary | ICD-10-CM

## 2013-04-03 DIAGNOSIS — D631 Anemia in chronic kidney disease: Secondary | ICD-10-CM

## 2013-04-03 LAB — CBC WITH DIFFERENTIAL (CANCER CENTER ONLY)
BASO#: 0 10*3/uL (ref 0.0–0.2)
BASO%: 0.5 % (ref 0.0–2.0)
EOS%: 3 % (ref 0.0–7.0)
HGB: 7.5 g/dL — ABNORMAL LOW (ref 11.6–15.9)
LYMPH#: 0.5 10*3/uL — ABNORMAL LOW (ref 0.9–3.3)
MCH: 33.9 pg (ref 26.0–34.0)
MCHC: 31.3 g/dL — ABNORMAL LOW (ref 32.0–36.0)
MONO%: 12.7 % (ref 0.0–13.0)
NEUT#: 2.6 10*3/uL (ref 1.5–6.5)
RDW: 25.1 % — ABNORMAL HIGH (ref 11.1–15.7)

## 2013-04-03 LAB — BASIC METABOLIC PANEL - CANCER CENTER ONLY
BUN, Bld: 22 mg/dL (ref 7–22)
Chloride: 103 mEq/L (ref 98–108)
Creat: 1.5 mg/dl — ABNORMAL HIGH (ref 0.6–1.2)
Potassium: 3.4 mEq/L (ref 3.3–4.7)

## 2013-04-03 LAB — TECHNOLOGIST REVIEW CHCC SATELLITE: Tech Review: 1

## 2013-04-03 LAB — PREPARE RBC (CROSSMATCH)

## 2013-04-03 MED ORDER — SODIUM CHLORIDE 0.9 % IJ SOLN
3.0000 mL | Freq: Once | INTRAMUSCULAR | Status: AC
  Administered 2013-04-03: 10 mL via INTRAVENOUS
  Filled 2013-04-03: qty 10

## 2013-04-03 MED ORDER — SODIUM CHLORIDE 0.9 % IV SOLN
Freq: Once | INTRAVENOUS | Status: AC
  Administered 2013-04-03: 13:00:00 via INTRAVENOUS

## 2013-04-03 NOTE — Progress Notes (Signed)
Labs discussed with Eunice Blase, PA. Will reschedule chemo for next week and give 2 units PRBCs 04/04/13.

## 2013-04-03 NOTE — Patient Instructions (Signed)
Platelet Count The platelet count is the number of platelets per mm in a sample of your blood. Platelets are an important part of the blood's clotting process. This test is used to evaluate patients who develop bleeding disorders. PREPARATION FOR TEST No preparation or fasting is needed.  Avoid strenuous exercise prior to this test.  For females, inform your caregiver if you are pregnant or menstruating. NORMAL FINDINGS  Adult/elderly: 150,000 to 400,000/mm or 150 to 400 x 109/L (SI units)  Premature infant: 100,000 to 300,000/ mm  Newborn: 150,000 to 300,000/ mm  Infant: 200,000 to 475,000/ mm  Child: 150,000 to 400,000/ mm Possible critical values: less than 50,000 or greater than 1 million/ mm Ranges for normal findings may vary among different laboratories and hospitals. You should always check with your caregiver after having lab work or other tests done to discuss the meaning of your test results and whether your values are considered within normal limits. MEANING OF TEST  Your caregiver will go over the test results with you. Your caregiver will discuss the importance and meaning of your results. He or she will also discuss treatment options and additional tests, if needed. OBTAINING THE TEST RESULTS It is your responsibility to obtain your test results. Ask the lab or department performing the test when and how you will get your results. Document Released: 10/29/2004 Document Revised: 12/27/2011 Document Reviewed: 09/15/2008 ExitCare Patient Information 2014 [employer].  

## 2013-04-04 ENCOUNTER — Telehealth: Payer: Self-pay | Admitting: Hematology & Oncology

## 2013-04-04 ENCOUNTER — Ambulatory Visit (HOSPITAL_BASED_OUTPATIENT_CLINIC_OR_DEPARTMENT_OTHER): Payer: Medicare Other

## 2013-04-04 ENCOUNTER — Ambulatory Visit: Payer: Medicare Other

## 2013-04-04 VITALS — BP 124/63 | HR 80 | Temp 97.8°F | Resp 20

## 2013-04-04 DIAGNOSIS — C9002 Multiple myeloma in relapse: Secondary | ICD-10-CM

## 2013-04-04 MED ORDER — SODIUM CHLORIDE 0.9 % IV SOLN
250.0000 mL | Freq: Once | INTRAVENOUS | Status: AC
  Administered 2013-04-04: 250 mL via INTRAVENOUS

## 2013-04-04 MED ORDER — ACETAMINOPHEN 325 MG PO TABS
650.0000 mg | ORAL_TABLET | Freq: Once | ORAL | Status: AC
  Administered 2013-04-04: 650 mg via ORAL

## 2013-04-04 MED ORDER — DIPHENHYDRAMINE HCL 25 MG PO CAPS
25.0000 mg | ORAL_CAPSULE | Freq: Once | ORAL | Status: AC
  Administered 2013-04-04: 25 mg via ORAL

## 2013-04-04 MED ORDER — SODIUM CHLORIDE 0.9 % IJ SOLN
10.0000 mL | INTRAMUSCULAR | Status: DC | PRN
  Administered 2013-04-04: 10 mL via INTRAVENOUS
  Filled 2013-04-04: qty 10

## 2013-04-04 NOTE — Telephone Encounter (Signed)
Per Amy/rn to cx 6/24; 6/25; and to change 04/17/13 tx to 5 hours.  Calendar was printed out and Amy gave schedule to patient.

## 2013-04-04 NOTE — Patient Instructions (Signed)
Blood Transfusion  A blood transfusion replaces your blood or some of its parts. Blood is replaced when you have lost blood because of surgery, an accident, or for severe blood conditions like anemia. You can donate blood to be used on yourself if you have a planned surgery. If you lose blood during that surgery, your own blood can be given back to you. Any blood given to you is checked to make sure it matches your blood type. Your temperature, blood pressure, and heart rate (vital signs) will be checked often.  GET HELP RIGHT AWAY IF:   You feel sick to your stomach (nauseous) or throw up (vomit).  You have watery poop (diarrhea).  You have shortness of breath or trouble breathing.  You have blood in your pee (urine) or have dark colored pee.  You have chest pain or tightness.  Your eyes or skin turn yellow (jaundice).  You have a temperature by mouth above 102 F (38.9 C), not controlled by medicine.  You start to shake and have chills.  You develop a a red rash (hives) or feel itchy.  You develop lightheadedness or feel confused.  You develop back, joint, or muscle pain.  You do not feel hungry (lost appetite).  You feel tired, restless, or nervous.  You develop belly (abdominal) cramps. Document Released: 12/31/2008 Document Revised: 12/27/2011 Document Reviewed: 12/31/2008 ExitCare Patient Information 2014 [employer].  

## 2013-04-05 LAB — TYPE AND SCREEN
ABO/RH(D): A NEG
Unit division: 0

## 2013-04-10 ENCOUNTER — Other Ambulatory Visit: Payer: Medicare Other | Admitting: Lab

## 2013-04-10 ENCOUNTER — Ambulatory Visit: Payer: Medicare Other

## 2013-04-11 ENCOUNTER — Ambulatory Visit: Payer: Medicare Other

## 2013-04-17 ENCOUNTER — Ambulatory Visit (HOSPITAL_BASED_OUTPATIENT_CLINIC_OR_DEPARTMENT_OTHER): Payer: Medicare Other

## 2013-04-17 ENCOUNTER — Ambulatory Visit (HOSPITAL_BASED_OUTPATIENT_CLINIC_OR_DEPARTMENT_OTHER): Payer: Medicare Other | Admitting: Hematology & Oncology

## 2013-04-17 ENCOUNTER — Other Ambulatory Visit (HOSPITAL_BASED_OUTPATIENT_CLINIC_OR_DEPARTMENT_OTHER): Payer: Medicare Other | Admitting: Lab

## 2013-04-17 VITALS — BP 121/57 | HR 106 | Temp 98.9°F | Resp 16 | Ht 60.0 in | Wt 116.0 lb

## 2013-04-17 VITALS — BP 99/59 | HR 80 | Temp 97.0°F | Resp 20

## 2013-04-17 DIAGNOSIS — R64 Cachexia: Secondary | ICD-10-CM

## 2013-04-17 DIAGNOSIS — Z5112 Encounter for antineoplastic immunotherapy: Secondary | ICD-10-CM

## 2013-04-17 DIAGNOSIS — D630 Anemia in neoplastic disease: Secondary | ICD-10-CM

## 2013-04-17 DIAGNOSIS — N289 Disorder of kidney and ureter, unspecified: Secondary | ICD-10-CM

## 2013-04-17 DIAGNOSIS — C9002 Multiple myeloma in relapse: Secondary | ICD-10-CM

## 2013-04-17 DIAGNOSIS — R634 Abnormal weight loss: Secondary | ICD-10-CM

## 2013-04-17 DIAGNOSIS — C9000 Multiple myeloma not having achieved remission: Secondary | ICD-10-CM

## 2013-04-17 DIAGNOSIS — Z5111 Encounter for antineoplastic chemotherapy: Secondary | ICD-10-CM

## 2013-04-17 LAB — CBC WITH DIFFERENTIAL (CANCER CENTER ONLY)
EOS%: 6.7 % (ref 0.0–7.0)
LYMPH#: 0.8 10*3/uL — ABNORMAL LOW (ref 0.9–3.3)
MCH: 32.9 pg (ref 26.0–34.0)
MCHC: 31.6 g/dL — ABNORMAL LOW (ref 32.0–36.0)
MONO%: 11.7 % (ref 0.0–13.0)
NEUT#: 3.3 10*3/uL (ref 1.5–6.5)
Platelets: 61 10*3/uL — ABNORMAL LOW (ref 145–400)
RBC: 3.1 10*6/uL — ABNORMAL LOW (ref 3.70–5.32)

## 2013-04-17 LAB — CMP (CANCER CENTER ONLY)
AST: 42 U/L — ABNORMAL HIGH (ref 11–38)
Alkaline Phosphatase: 132 U/L — ABNORMAL HIGH (ref 26–84)
BUN, Bld: 27 mg/dL — ABNORMAL HIGH (ref 7–22)
Creat: 1.5 mg/dl — ABNORMAL HIGH (ref 0.6–1.2)
Total Bilirubin: 0.7 mg/dl (ref 0.20–1.60)

## 2013-04-17 MED ORDER — SODIUM CHLORIDE 0.9 % IV SOLN
INTRAVENOUS | Status: DC
  Administered 2013-04-17: 11:00:00 via INTRAVENOUS

## 2013-04-17 MED ORDER — HEPARIN SOD (PORK) LOCK FLUSH 100 UNIT/ML IV SOLN
250.0000 [IU] | INTRAVENOUS | Status: DC | PRN
  Filled 2013-04-17: qty 5

## 2013-04-17 MED ORDER — ZOLEDRONIC ACID 4 MG/5ML IV CONC
3.3000 mg | Freq: Once | INTRAVENOUS | Status: AC
  Administered 2013-04-17: 3.3 mg via INTRAVENOUS
  Filled 2013-04-17: qty 4.13

## 2013-04-17 MED ORDER — SODIUM CHLORIDE 0.9 % IJ SOLN
10.0000 mL | INTRAMUSCULAR | Status: AC | PRN
  Administered 2013-04-17: 10 mL
  Filled 2013-04-17: qty 10

## 2013-04-17 MED ORDER — IMMUNE GLOBULIN (HUMAN) 20 GM/200ML IV SOLN
0.7000 g/kg | Freq: Once | INTRAVENOUS | Status: AC
  Administered 2013-04-17: 40 g via INTRAVENOUS
  Filled 2013-04-17: qty 400

## 2013-04-17 MED ORDER — DARBEPOETIN ALFA-POLYSORBATE 300 MCG/0.6ML IJ SOLN
300.0000 ug | Freq: Once | INTRAMUSCULAR | Status: AC
  Administered 2013-04-17: 300 ug via SUBCUTANEOUS

## 2013-04-17 MED ORDER — MEGESTROL ACETATE 625 MG/5ML PO SUSP: 625.0000 mg | Freq: Every day | ORAL | Status: DC

## 2013-04-17 MED ORDER — SODIUM CHLORIDE 0.9 % IV SOLN
225.0000 mg/m2 | Freq: Once | INTRAVENOUS | Status: AC
  Administered 2013-04-17: 340 mg via INTRAVENOUS
  Filled 2013-04-17: qty 17

## 2013-04-17 MED ORDER — DEXTROSE 5 % IV SOLN
36.0000 mg/m2 | Freq: Once | INTRAVENOUS | Status: AC
  Administered 2013-04-17: 54 mg via INTRAVENOUS
  Filled 2013-04-17: qty 27

## 2013-04-17 MED ORDER — LORAZEPAM 2 MG/ML IJ SOLN
0.5000 mg | Freq: Once | INTRAMUSCULAR | Status: AC
  Administered 2013-04-17: 0.5 mg via INTRAVENOUS

## 2013-04-17 MED ORDER — HEPARIN SOD (PORK) LOCK FLUSH 100 UNIT/ML IV SOLN
500.0000 [IU] | INTRAVENOUS | Status: DC | PRN
  Filled 2013-04-17: qty 5

## 2013-04-17 MED ORDER — DIPHENHYDRAMINE HCL 25 MG PO CAPS
25.0000 mg | ORAL_CAPSULE | Freq: Once | ORAL | Status: AC
  Administered 2013-04-17: 25 mg via ORAL

## 2013-04-17 MED ORDER — ACETAMINOPHEN 325 MG PO TABS
650.0000 mg | ORAL_TABLET | Freq: Once | ORAL | Status: AC
  Administered 2013-04-17: 650 mg via ORAL

## 2013-04-17 MED ORDER — ONDANSETRON 8 MG/50ML IVPB (CHCC)
8.0000 mg | Freq: Once | INTRAVENOUS | Status: AC
  Administered 2013-04-17: 8 mg via INTRAVENOUS

## 2013-04-17 MED ORDER — DEXAMETHASONE SODIUM PHOSPHATE 20 MG/5ML IJ SOLN
40.0000 mg | Freq: Once | INTRAMUSCULAR | Status: AC
  Administered 2013-04-17: 40 mg via INTRAVENOUS
  Filled 2013-04-17: qty 10

## 2013-04-17 NOTE — Progress Notes (Signed)
This office note has been dictated.

## 2013-04-17 NOTE — Progress Notes (Signed)
Complain nausea, Dr. Myna Hidalgo notified, order for Ativan received.

## 2013-04-17 NOTE — Patient Instructions (Addendum)

## 2013-04-18 ENCOUNTER — Ambulatory Visit (HOSPITAL_BASED_OUTPATIENT_CLINIC_OR_DEPARTMENT_OTHER): Payer: Medicare Other

## 2013-04-18 DIAGNOSIS — C9002 Multiple myeloma in relapse: Secondary | ICD-10-CM

## 2013-04-18 DIAGNOSIS — C9000 Multiple myeloma not having achieved remission: Secondary | ICD-10-CM

## 2013-04-18 DIAGNOSIS — Z5112 Encounter for antineoplastic immunotherapy: Secondary | ICD-10-CM

## 2013-04-18 LAB — KAPPA/LAMBDA LIGHT CHAINS
Kappa free light chain: 5.41 mg/dL — ABNORMAL HIGH (ref 0.33–1.94)
Kappa:Lambda Ratio: 8.45 — ABNORMAL HIGH (ref 0.26–1.65)
Lambda Free Lght Chn: 0.64 mg/dL (ref 0.57–2.63)

## 2013-04-18 MED ORDER — SODIUM CHLORIDE 0.9 % IJ SOLN
10.0000 mL | INTRAMUSCULAR | Status: DC | PRN
  Administered 2013-04-18: 10 mL
  Filled 2013-04-18: qty 10

## 2013-04-18 MED ORDER — DEXTROSE 5 % IV SOLN
36.0000 mg/m2 | Freq: Once | INTRAVENOUS | Status: AC
  Administered 2013-04-18: 54 mg via INTRAVENOUS
  Filled 2013-04-18: qty 27

## 2013-04-18 MED ORDER — HEPARIN SOD (PORK) LOCK FLUSH 100 UNIT/ML IV SOLN
250.0000 [IU] | Freq: Once | INTRAVENOUS | Status: AC | PRN
  Administered 2013-04-18: 250 [IU]
  Filled 2013-04-18: qty 5

## 2013-04-18 MED ORDER — ONDANSETRON 8 MG/50ML IVPB (CHCC)
8.0000 mg | Freq: Once | INTRAVENOUS | Status: AC
  Administered 2013-04-18: 8 mg via INTRAVENOUS

## 2013-04-18 MED ORDER — SODIUM CHLORIDE 0.9 % IV SOLN
Freq: Once | INTRAVENOUS | Status: AC
  Administered 2013-04-18: 13:00:00 via INTRAVENOUS

## 2013-04-18 NOTE — Patient Instructions (Addendum)
[facility] Discharge Instructions for Patients Receiving Chemotherapy  Today you received the following chemotherapy agents Kyprolis.  To help prevent nausea and vomiting after your treatment, we encourage you to take your nausea medication.   If you develop nausea and vomiting that is not controlled by your nausea medication, call the clinic.   BELOW ARE SYMPTOMS THAT SHOULD BE REPORTED IMMEDIATELY:  *FEVER GREATER THAN 100.5 F  *CHILLS WITH OR WITHOUT FEVER  NAUSEA AND VOMITING THAT IS NOT CONTROLLED WITH YOUR NAUSEA MEDICATION  *UNUSUAL SHORTNESS OF BREATH  *UNUSUAL BRUISING OR BLEEDING  TENDERNESS IN MOUTH AND THROAT WITH OR WITHOUT PRESENCE OF ULCERS  *URINARY PROBLEMS  *BOWEL PROBLEMS  UNUSUAL RASH Items with * indicate a potential emergency and should be followed up as soon as possible.  Feel free to call the clinic you have any questions or concerns. The clinic phone number is [phone].    

## 2013-04-18 NOTE — Progress Notes (Signed)
DIAGNOSES: 1. Refractory kappa light chain myeloma. 2. 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  CURRENT THERAPY: 1. Patient is receiving Kyprolis, Cytoxan and Decadron monthly. 2. IVIG monthly. 3. Zometa 3.3 mg IV monthly. 4. Aranesp 300 mcg subcutaneous as needed for a hemoglobin less than     10.  INTERIM HISTORY:  [name] comes in for a followup.  I think we had to hold her last week of chemo because of thrombocytopenia.  She is holding her own.  I am worried about her weight loss.  We are going to have to put her on some Megace I think to try to help with her weight.  She says her appetite is just down and she does not feel eating that much.  She has had no bleeding.  She has had no nausea or vomiting.  She just is quite tired.  It is very hard to know how her myeloma is responding as she does not make a lot of light chain in her serum.  Her last kappa light chain was 5.8 in early June.  Lambda light chain was 0.03 mg/dL.  She never has a monoclonal spike in her serum.  She is not iron deficient.  Her ferritin was 6408.  Her last iron saturation was 65%.  She has not noted any fevers.  She has had no rashes.  She has had decent pain control.  She is on quite a few medications.  She also has hypothyroidism and is on Synthroid.  We do have her on acyclovir.  PHYSICAL EXAMINATION:  General:  This is a petite white female in no obvious distress.  Vital signs:  Temperature 98.9, pulse 106, respiratory rate 16, blood pressure 121/57.  Weight is 116 pounds.  Head and neck:  Normocephalic, atraumatic skull.  There are no ocular or oral lesions.  There are no palpable cervical or supraclavicular lymph nodes. Lungs:  Clear bilaterally.  Cardiac:  Regular rate and rhythm with a normal S1 and S2.  There are no murmurs, rubs or bruits.  Abdomen:  Soft with good bowel sounds.  There is no palpable abdominal mass.  There is no fluid wave.  No palpable  hepatosplenomegaly.  Extremities:  No clubbing, cyanosis or edema.  She has a lot of stiffness in her thighs secondary to past radiation changes.  Back:  No tenderness over the spine.  Neurologic:  No focal neurological deficits.  LABORATORY STUDIES:  White cell count 5, hemoglobin 10.2, hematocrit 32.3, platelet count 61.  Sodium 140, potassium 3.6, BUN 27, creatinine 1.5.  Calcium 10.5, albumin 3.3.  IMPRESSION:  [name] is a very charming 67 year old white female with refractory kappa light chain myeloma.  She is not a candidate for protocol therapy as of yet.  We are trying her on Cytoxan, Kyprolis and Decadron.  Hopefully, we get some synergy with this.  It is hard to know response.  It is hard to measure light chains. However, her light chains are up with the kappa light chain, so we might not be seeing a response.  We will have to set her up with a bone marrow test after we are done with this cycle of chemo.  We will continue her on the Zometa and the IVIG.  She really wants to go as long as she can because of her new grandchildren.  This really is driving her to get better and to try whatever she can.  We will continue to monitor her blood work weekly.  I  will plan for a bone marrow test probably in early August.  We will plan to get her back once we get the bone marrow results back.  I will be giving her Megace elixir to try to help with her appetite.    ______________________________ Josph Macho, M.D. PRE/MEDQ  D:  04/17/2013  T:  04/18/2013  Job:  [id number]

## 2013-04-19 ENCOUNTER — Other Ambulatory Visit: Payer: Self-pay | Admitting: Hematology & Oncology

## 2013-04-19 DIAGNOSIS — C9000 Multiple myeloma not having achieved remission: Secondary | ICD-10-CM

## 2013-04-23 ENCOUNTER — Other Ambulatory Visit: Payer: Self-pay | Admitting: *Deleted

## 2013-04-23 DIAGNOSIS — D631 Anemia in chronic kidney disease: Secondary | ICD-10-CM

## 2013-04-23 DIAGNOSIS — C9000 Multiple myeloma not having achieved remission: Secondary | ICD-10-CM

## 2013-04-24 ENCOUNTER — Other Ambulatory Visit (HOSPITAL_BASED_OUTPATIENT_CLINIC_OR_DEPARTMENT_OTHER): Payer: Medicare Other | Admitting: Lab

## 2013-04-24 ENCOUNTER — Ambulatory Visit (HOSPITAL_BASED_OUTPATIENT_CLINIC_OR_DEPARTMENT_OTHER): Payer: Medicare Other

## 2013-04-24 VITALS — BP 106/63 | HR 80 | Temp 98.8°F

## 2013-04-24 DIAGNOSIS — N189 Chronic kidney disease, unspecified: Secondary | ICD-10-CM

## 2013-04-24 DIAGNOSIS — C9000 Multiple myeloma not having achieved remission: Secondary | ICD-10-CM

## 2013-04-24 DIAGNOSIS — D631 Anemia in chronic kidney disease: Secondary | ICD-10-CM

## 2013-04-24 DIAGNOSIS — N039 Chronic nephritic syndrome with unspecified morphologic changes: Secondary | ICD-10-CM

## 2013-04-24 LAB — CBC WITH DIFFERENTIAL (CANCER CENTER ONLY)
BASO%: 0.3 % (ref 0.0–2.0)
EOS%: 6.2 % (ref 0.0–7.0)
HCT: 26.9 % — ABNORMAL LOW (ref 34.8–46.6)
LYMPH%: 17.9 % (ref 14.0–48.0)
MCHC: 32 g/dL (ref 32.0–36.0)
MCV: 105 fL — ABNORMAL HIGH (ref 81–101)
MONO#: 0.6 10*3/uL (ref 0.1–0.9)
MONO%: 17.4 % — ABNORMAL HIGH (ref 0.0–13.0)
NEUT%: 58.2 % (ref 39.6–80.0)
Platelets: 25 10*3/uL — ABNORMAL LOW (ref 145–400)
RDW: 20.9 % — ABNORMAL HIGH (ref 11.1–15.7)
WBC: 3.6 10*3/uL — ABNORMAL LOW (ref 3.9–10.0)

## 2013-04-24 LAB — TECHNOLOGIST REVIEW CHCC SATELLITE

## 2013-04-24 MED ORDER — METOCLOPRAMIDE HCL 5 MG/ML IJ SOLN
10.0000 mg | Freq: Once | INTRAMUSCULAR | Status: AC
  Administered 2013-04-24: 10 mg via INTRAVENOUS

## 2013-04-24 NOTE — Progress Notes (Signed)
Discussed labs with Dr. Myna Hidalgo, will hold on treatment this week, c/o nausea, orders received.

## 2013-04-24 NOTE — Patient Instructions (Signed)
Metoclopramide injection What is this medicine? METOCLOPRAMIDE (met oh kloe PRA mide) is used to treat people with slow emptying of the stomach and intestinal tract. It may be used to prevent nausea and vomiting caused by cancer treatment or surgery. This medicine may also be used before certain stomach exams or procedures. This medicine may be used for other purposes; ask your health care provider or pharmacist if you have questions. What should I tell my health care provider before I take this medicine? They need to know if you have any of these conditions: -breast cancer -depression -diabetes -heart failure -high blood pressure -kidney disease -liver disease -Parkinson's disease or a movement disorder -pheochromocytoma -seizures -stomach obstruction, bleeding, or perforation -an unusual or allergic reaction to metoclopramide, procainamide, sulfites, other medicines, foods, dyes, or preservatives -pregnant or trying to get pregnant -breast-feeding How should I use this medicine? This medicine is for injection into a muscle or for infusion into a vein. It is given by a health care professional in a hospital or clinic setting. A special MedGuide will be given to you by the pharmacist with each prescription and refill. Be sure to read this information carefully each time. Talk to your pediatrician regarding the use of this medicine in children. Special care may be needed. Overdosage: If you think you have taken too much of this medicine contact a poison control center or emergency room at once. NOTE: This medicine is only for you. Do not share this medicine with others. What if I miss a dose? This does not apply. What may interact with this medicine? -acetaminophen -cyclosporine -digoxin -medicines for blood pressure -medicines for depression, especially an Monoamine Oxidase Inhibitor (MAOI) -medicines for diabetes, including insulin -medicines for hay fever and other  allergies -medicines for Parkinson's disease, like levodopa -medicines for sleep or for pain -tetracycline This list may not describe all possible interactions. Give your health care provider a list of all the medicines, herbs, non-prescription drugs, or dietary supplements you use. Also tell them if you smoke, drink alcohol, or use illegal drugs. Some items may interact with your medicine. What should I watch for while using this medicine? It may take a few weeks for your stomach condition to get better. However, do not take this medicine for longer than 12 weeks. The longer you take this medicine, and the more you take it, the greater your chances are of developing serious side effects. If you are an elderly patient, a female patient, or you have diabetes, you may be at an increased risk for side effects from this medicine. Contact your doctor immediately if you start having movements you cannot control such as lip smacking, rapid movements of the tongue, involuntary or uncontrollable movements of the eyes, head, arms and legs, or muscle twitches and spasms. Patients and their families should watch out for worsening depression or thoughts of suicide. Also watch out for any sudden or severe changes in feelings such as feeling anxious, agitated, panicky, irritable, hostile, aggressive, impulsive, severely restless, overly excited and hyperactive, or not being able to sleep. If this happens, especially at the beginning of treatment or after a change in dose, call your doctor. Do not treat yourself for high fever. Ask your doctor or health care professional for advice. You may get drowsy or dizzy. Do not drive, use machinery, or do anything that needs mental alertness until you know how this drug affects you. Do not stand or sit up quickly, especially if you are an older patient. This  reduces the risk of dizzy or fainting spells. Alcohol can make you more drowsy and dizzy. Avoid alcoholic drinks. What side  effects may I notice from receiving this medicine? Side effects that you should report to your doctor or health care professional as soon as possible: -allergic reactions like skin rash, itching or hives, swelling of the face, lips, or tongue -abnormal production of milk in females -breast enlargement in both males and females -change in the way you walk -difficulty moving, speaking or swallowing -drooling, lip smacking, or rapid movements of the tongue -excessive sweating -fever -involuntary or uncontrollable movements of the eyes, head, arms and legs -irregular heartbeat or palpitations -muscle twitches and spasms -unusually weak or tired Side effects that usually do not require medical attention (report to your doctor or health care professional if they continue or are bothersome): -change in sex drive or performance -depressed mood -diarrhea -difficulty sleeping -headache -menstrual changes -restless or nervous This list may not describe all possible side effects. Call your doctor for medical advice about side effects. You may report side effects to FDA at 1-800-FDA-1088. Where should I keep my medicine? This drug is given in a hospital or clinic and will not be stored at home. NOTE: This sheet is a summary. It may not cover all possible information. If you have questions about this medicine, talk to your doctor, pharmacist, or health care provider.  2013, [employer]. (05/30/2008 1:59:13 PM)

## 2013-04-25 ENCOUNTER — Ambulatory Visit: Payer: Medicare Other

## 2013-04-25 ENCOUNTER — Other Ambulatory Visit: Payer: Medicare Other | Admitting: Lab

## 2013-04-29 ENCOUNTER — Other Ambulatory Visit: Payer: Self-pay | Admitting: Hematology & Oncology

## 2013-04-30 ENCOUNTER — Other Ambulatory Visit: Payer: Self-pay | Admitting: Medical

## 2013-04-30 DIAGNOSIS — C9000 Multiple myeloma not having achieved remission: Secondary | ICD-10-CM

## 2013-05-01 ENCOUNTER — Telehealth: Payer: Self-pay | Admitting: Hematology & Oncology

## 2013-05-01 ENCOUNTER — Other Ambulatory Visit (HOSPITAL_BASED_OUTPATIENT_CLINIC_OR_DEPARTMENT_OTHER): Payer: Medicare Other | Admitting: Lab

## 2013-05-01 ENCOUNTER — Ambulatory Visit: Payer: Medicare Other

## 2013-05-01 DIAGNOSIS — C9000 Multiple myeloma not having achieved remission: Secondary | ICD-10-CM

## 2013-05-01 LAB — CBC WITH DIFFERENTIAL (CANCER CENTER ONLY)
BASO#: 0 10*3/uL (ref 0.0–0.2)
BASO%: 0.5 % (ref 0.0–2.0)
EOS%: 5.8 % (ref 0.0–7.0)
HGB: 8.7 g/dL — ABNORMAL LOW (ref 11.6–15.9)
LYMPH#: 0.7 10*3/uL — ABNORMAL LOW (ref 0.9–3.3)
MCH: 34.3 pg — ABNORMAL HIGH (ref 26.0–34.0)
MCHC: 32 g/dL (ref 32.0–36.0)
MONO%: 13.5 % — ABNORMAL HIGH (ref 0.0–13.0)
NEUT#: 2.3 10*3/uL (ref 1.5–6.5)
Platelets: 37 10*3/uL — ABNORMAL LOW (ref 145–400)
RDW: 20.8 % — ABNORMAL HIGH (ref 11.1–15.7)

## 2013-05-01 LAB — TECHNOLOGIST REVIEW CHCC SATELLITE

## 2013-05-01 NOTE — Patient Instructions (Signed)
Thrombocytopenia Thrombocytopenia is a condition in which there is an abnormally small number of platelets in your blood. Platelets are also called thrombocytes. Platelets are needed for blood clotting. CAUSES Thrombocytopenia is caused by:   Decreased production of platelets. This can be caused by:  Aplastic anemia in which your bone marrow quits making blood cells.  Cancer in the bone marrow.  Use of certain medicines, including chemotherapy.  Infection in the bone marrow.  Heavy alcohol consumption.  Increased destruction of platelets. This can be caused by:  Certain immune diseases.  Use of certain drugs.  Certain blood clotting disorders.  Certain inherited disorders.  Certain bleeding disorders.  Pregnancy.  Having an enlarged spleen (hypersplenism). In hypersplenism, the spleen gathers up platelets from circulation. This means the platelets are not available to help with blood clotting. The spleen can enlarge due to cirrhosis or other conditions. SYMPTOMS  The symptoms of thrombocytopenia are side effects of poor blood clotting. Some of these are:  Abnormal bleeding.  Nosebleeds.  Heavy menstrual periods.  Blood in the urine or stools.  Purpura. This is a purplish discoloration in the skin produced by small bleeding vessels near the surface of the skin.  Bruising.  A rash that may be petechial. This looks like pinpoint, purplish-red spots on the skin and mucous membranes. It is caused by bleeding from small blood vessels (capillaries). DIAGNOSIS  Your caregiver will make this diagnosis based on your exam and blood tests. Sometimes, a bone marrow study is done to look for the original cells (megakaryocytes) that make platelets. TREATMENT  Treatment depends on the cause of the condition.  Medicines may be given to help protect your platelets from being destroyed.  In some cases, a replacement (transfusion) of platelets may be required to stop or prevent  bleeding.  Sometimes, the spleen must be surgically removed. HOME CARE INSTRUCTIONS   Check the skin and linings inside your mouth for bruising or bleeding as directed by your caregiver.  Check your sputum, urine, and stool for blood as directed by your caregiver.  Do not return to any activities that could cause bumps or bruises until your caregiver says it is okay.  Take extra care not to cut yourself when shaving or when using scissors, needles, knives, and other tools.  Take extra care not to burn yourself when ironing or cooking.  Ask your caregiver if it is okay for you to drink alcohol.  Only take over-the-counter or prescription medicines as directed by your caregiver.  Notify all your caregivers, including dentists and eye doctors, about your condition. SEEK IMMEDIATE MEDICAL CARE IF:   You develop active bleeding from anywhere in your body.  You develop unexplained bruising or bleeding.  You have blood in your sputum, urine, or stool. MAKE SURE YOU:  Understand these instructions.  Will watch your condition.  Will get help right away if you are not doing well or get worse. Document Released: 10/04/2005 Document Revised: 12/27/2011 Document Reviewed: 08/06/2011 Exit[employer] Patient Information 2014 [employer].  

## 2013-05-01 NOTE — Progress Notes (Signed)
Platelets 37.  No chemo today per Eunice Blase PA.  Patient to return in one week for chemo

## 2013-05-01 NOTE — Telephone Encounter (Signed)
Per nurse Ginger - Schedule two back to back, two hour infusion appts for this pt for next week off 05/07/2013.

## 2013-05-02 ENCOUNTER — Ambulatory Visit: Payer: Medicare Other

## 2013-05-09 ENCOUNTER — Ambulatory Visit: Payer: Medicare Other

## 2013-05-09 ENCOUNTER — Telehealth: Payer: Self-pay | Admitting: Hematology & Oncology

## 2013-05-09 NOTE — Telephone Encounter (Signed)
Pt called to cancel due to scooter for her car  is not working properly  today and is calling someone to fix it today.

## 2013-05-10 ENCOUNTER — Encounter (HOSPITAL_COMMUNITY)
Admission: RE | Admit: 2013-05-10 | Discharge: 2013-05-10 | Disposition: A | Discharge status: Home / Self Care | Payer: Medicare Other | Source: Ambulatory Visit | Attending: Hematology & Oncology | Admitting: Hematology & Oncology

## 2013-05-10 ENCOUNTER — Other Ambulatory Visit (HOSPITAL_BASED_OUTPATIENT_CLINIC_OR_DEPARTMENT_OTHER): Payer: Medicare Other | Admitting: Lab

## 2013-05-10 ENCOUNTER — Other Ambulatory Visit: Payer: Self-pay | Admitting: *Deleted

## 2013-05-10 ENCOUNTER — Ambulatory Visit (HOSPITAL_BASED_OUTPATIENT_CLINIC_OR_DEPARTMENT_OTHER): Payer: Medicare Other

## 2013-05-10 VITALS — BP 102/70 | HR 78 | Temp 98.0°F | Resp 20

## 2013-05-10 DIAGNOSIS — Z5111 Encounter for antineoplastic chemotherapy: Secondary | ICD-10-CM

## 2013-05-10 DIAGNOSIS — Z452 Encounter for adjustment and management of vascular access device: Secondary | ICD-10-CM

## 2013-05-10 DIAGNOSIS — C9000 Multiple myeloma not having achieved remission: Secondary | ICD-10-CM

## 2013-05-10 DIAGNOSIS — Z5112 Encounter for antineoplastic immunotherapy: Secondary | ICD-10-CM

## 2013-05-10 DIAGNOSIS — D631 Anemia in chronic kidney disease: Secondary | ICD-10-CM

## 2013-05-10 DIAGNOSIS — N189 Chronic kidney disease, unspecified: Secondary | ICD-10-CM | POA: Insufficient documentation

## 2013-05-10 LAB — CBC WITH DIFFERENTIAL (CANCER CENTER ONLY)
BASO#: 0 10*3/uL (ref 0.0–0.2)
Eosinophils Absolute: 0.2 10*3/uL (ref 0.0–0.5)
HCT: 22.1 % — ABNORMAL LOW (ref 34.8–46.6)
HGB: 7 g/dL — ABNORMAL LOW (ref 11.6–15.9)
LYMPH%: 14.2 % (ref 14.0–48.0)
MCV: 109 fL — ABNORMAL HIGH (ref 81–101)
MONO#: 0.6 10*3/uL (ref 0.1–0.9)
Platelets: 42 10*3/uL — ABNORMAL LOW (ref 145–400)
RBC: 2.02 10*6/uL — ABNORMAL LOW (ref 3.70–5.32)
WBC: 3.2 10*3/uL — ABNORMAL LOW (ref 3.9–10.0)

## 2013-05-10 LAB — CMP (CANCER CENTER ONLY)
Albumin: 3.1 g/dL — ABNORMAL LOW (ref 3.3–5.5)
CO2: 26 mEq/L (ref 18–33)
Glucose, Bld: 105 mg/dL (ref 73–118)
Sodium: 138 mEq/L (ref 128–145)
Total Bilirubin: 0.7 mg/dl (ref 0.20–1.60)
Total Protein: 5.9 g/dL — ABNORMAL LOW (ref 6.4–8.1)

## 2013-05-10 LAB — PREPARE RBC (CROSSMATCH)

## 2013-05-10 LAB — TECHNOLOGIST REVIEW CHCC SATELLITE

## 2013-05-10 MED ORDER — SODIUM CHLORIDE 0.9 % IV SOLN
Freq: Once | INTRAVENOUS | Status: AC
  Administered 2013-05-10: 15:00:00 via INTRAVENOUS

## 2013-05-10 MED ORDER — ALTEPLASE 2 MG IJ SOLR
2.0000 mg | Freq: Once | INTRAMUSCULAR | Status: AC | PRN
  Administered 2013-05-10: 2 mg
  Filled 2013-05-10: qty 2

## 2013-05-10 MED ORDER — ONDANSETRON 8 MG/50ML IVPB (CHCC)
8.0000 mg | Freq: Once | INTRAVENOUS | Status: AC
  Administered 2013-05-10: 8 mg via INTRAVENOUS

## 2013-05-10 MED ORDER — DEXTROSE 5 % IV SOLN
20.0000 mg/m2 | Freq: Once | INTRAVENOUS | Status: AC
  Administered 2013-05-10: 30 mg via INTRAVENOUS
  Filled 2013-05-10: qty 15

## 2013-05-10 MED ORDER — HEPARIN SOD (PORK) LOCK FLUSH 100 UNIT/ML IV SOLN
250.0000 [IU] | Freq: Once | INTRAVENOUS | Status: DC | PRN
  Filled 2013-05-10: qty 5

## 2013-05-10 MED ORDER — DEXAMETHASONE SODIUM PHOSPHATE 20 MG/5ML IJ SOLN
40.0000 mg | Freq: Once | INTRAMUSCULAR | Status: AC
  Administered 2013-05-10: 40 mg via INTRAVENOUS
  Filled 2013-05-10: qty 10

## 2013-05-10 MED ORDER — SODIUM CHLORIDE 0.9 % IJ SOLN
10.0000 mL | INTRAMUSCULAR | Status: DC | PRN
  Filled 2013-05-10: qty 10

## 2013-05-10 MED ORDER — SODIUM CHLORIDE 0.9 % IV SOLN
150.0000 mg/m2 | Freq: Once | INTRAVENOUS | Status: AC
  Administered 2013-05-10: 220 mg via INTRAVENOUS
  Filled 2013-05-10: qty 11

## 2013-05-11 ENCOUNTER — Ambulatory Visit (HOSPITAL_BASED_OUTPATIENT_CLINIC_OR_DEPARTMENT_OTHER): Payer: Medicare Other

## 2013-05-11 VITALS — BP 110/56 | HR 79 | Temp 98.1°F | Resp 20

## 2013-05-11 DIAGNOSIS — N189 Chronic kidney disease, unspecified: Secondary | ICD-10-CM

## 2013-05-11 DIAGNOSIS — C9000 Multiple myeloma not having achieved remission: Secondary | ICD-10-CM

## 2013-05-11 DIAGNOSIS — N039 Chronic nephritic syndrome with unspecified morphologic changes: Secondary | ICD-10-CM

## 2013-05-11 DIAGNOSIS — D631 Anemia in chronic kidney disease: Secondary | ICD-10-CM

## 2013-05-11 DIAGNOSIS — Z5112 Encounter for antineoplastic immunotherapy: Secondary | ICD-10-CM

## 2013-05-11 MED ORDER — DIPHENHYDRAMINE HCL 25 MG PO CAPS
25.0000 mg | ORAL_CAPSULE | Freq: Once | ORAL | Status: AC
  Administered 2013-05-11: 25 mg via ORAL

## 2013-05-11 MED ORDER — ACETAMINOPHEN 325 MG PO TABS
650.0000 mg | ORAL_TABLET | Freq: Once | ORAL | Status: AC
  Administered 2013-05-11: 650 mg via ORAL

## 2013-05-11 MED ORDER — SODIUM CHLORIDE 0.9 % IJ SOLN
10.0000 mL | INTRAMUSCULAR | Status: DC | PRN
  Filled 2013-05-11: qty 10

## 2013-05-11 MED ORDER — SODIUM CHLORIDE 0.9 % IV SOLN: Freq: Once | INTRAVENOUS | Status: DC

## 2013-05-11 MED ORDER — SODIUM CHLORIDE 0.9 % IJ SOLN
10.0000 mL | INTRAMUSCULAR | Status: AC | PRN
  Administered 2013-05-11: 10 mL
  Filled 2013-05-11: qty 10

## 2013-05-11 MED ORDER — ONDANSETRON 8 MG/50ML IVPB (CHCC)
8.0000 mg | Freq: Once | INTRAVENOUS | Status: AC
  Administered 2013-05-11: 8 mg via INTRAVENOUS

## 2013-05-11 MED ORDER — DEXTROSE 5 % IV SOLN
20.0000 mg/m2 | Freq: Once | INTRAVENOUS | Status: AC
  Administered 2013-05-11: 30 mg via INTRAVENOUS
  Filled 2013-05-11: qty 15

## 2013-05-11 MED ORDER — SODIUM CHLORIDE 0.9 % IV SOLN
250.0000 mL | Freq: Once | INTRAVENOUS | Status: AC
  Administered 2013-05-11: 250 mL via INTRAVENOUS

## 2013-05-11 MED ORDER — HEPARIN SOD (PORK) LOCK FLUSH 100 UNIT/ML IV SOLN
250.0000 [IU] | Freq: Once | INTRAVENOUS | Status: AC | PRN
  Administered 2013-05-11: 16:00:00
  Filled 2013-05-11: qty 5

## 2013-05-11 MED ORDER — HEPARIN SOD (PORK) LOCK FLUSH 100 UNIT/ML IV SOLN
500.0000 [IU] | Freq: Every day | INTRAVENOUS | Status: DC | PRN
  Filled 2013-05-11: qty 5

## 2013-05-11 NOTE — Patient Instructions (Addendum)
Carfilzomib injection What is this medicine? CARFILZOMIB is a chemotherapy drug that works by slowing or stopping cancer cell growth. This medicine is used to treat multiple myeloma. This medicine may be used for other purposes; ask your health care provider or pharmacist if you have questions. What should I tell my health care provider before I take this medicine? They need to know if you have any of these conditions: -heart disease -irregular heartbeat -liver disease -lung or breathing disease -an unusual or allergic reaction to carfilzomib, or other medicines, foods, dyes, or preservatives -pregnant or trying to get pregnant -breast-feeding How should I use this medicine? This medicine is for injection or infusion into a vein. It is given by a health care professional in a hospital or clinic setting. Talk to your pediatrician regarding the use of this medicine in children. Special care may be needed. Overdosage: If you think you've taken too much of this medicine contact a poison control center or emergency room at once. Overdosage: If you think you have taken too much of this medicine contact a poison control center or emergency room at once. NOTE: This medicine is only for you. Do not share this medicine with others. What if I miss a dose? It is important not to miss your dose. Call your doctor or health care professional if you are unable to keep an appointment. What may interact with this medicine? Interactions are not expected. Give your health care provider a list of all the medicines, herbs, non-prescription drugs, or dietary supplements you use. Also tell them if you smoke, drink alcohol, or use illegal drugs. Some items may interact with your medicine. This list may not describe all possible interactions. Give your health care provider a list of all the medicines, herbs, non-prescription drugs, or dietary supplements you use. Also tell them if you smoke, drink alcohol, or use  illegal drugs. Some items may interact with your medicine. What should I watch for while using this medicine? Your condition will be monitored carefully while you are receiving this medicine. Report any side effects. Continue your course of treatment even though you feel ill unless your doctor tells you to stop. Call your doctor or health care professional for advice if you get a fever, chills or sore throat, or other symptoms of a cold or flu. Do not treat yourself. Try to avoid being around people who are sick. Do not become pregnant while taking this medicine. Women should inform their doctor if they wish to become pregnant or think they might be pregnant. There is a potential for serious side effects to an unborn child. Talk to your health care professional or pharmacist for more information. Do not breast-feed an infant while taking this medicine. Check with your doctor or health care professional if you get an attack of severe diarrhea, nausea and vomiting, or if you sweat a lot. The loss of too much body fluid can make it dangerous for you to take this medicine. You may get dizzy. Do not drive, use machinery, or do anything that needs mental alertness until you know how this medicine affects you. Do not stand or sit up quickly, especially if you are an older patient. This reduces the risk of dizzy or fainting spells. What side effects may I notice from receiving this medicine? Side effects that you should report to your doctor or health care professional as soon as possible: -allergic reactions like skin rash, itching or hives, swelling of the face, lips, or tongue -breathing  problems -chest pain or palpitationschest tightness -cough -dark urine -dizziness -feeling faint or lightheaded -fever or chills -general ill feeling or flu-like symptoms -light-colored stools -palpitations -right upper belly pain -swelling of the legs or ankles -unusual bleeding or bruising -unusually weak or  tired -yellowing of the eyes or skin  Side effects that usually do not require medical attention (Report these to your doctor or health care professional if they continue or are bothersome.): -diarrhea -headache -nausea, vomiting -tiredness This list may not describe all possible side effects. Call your doctor for medical advice about side effects. You may report side effects to FDA at 1-800-FDA-1088. Where should I keep my medicine? This drug is given in a hospital or clinic and will not be stored at home. NOTE: This sheet is a summary. It may not cover all possible information. If you have questions about this medicine, talk to your doctor, pharmacist, or health care provider.  2013, Elsevier/Gold Standard. (09/23/2011 4:16:30 PM)   Blood Transfusion Information WHAT IS A BLOOD TRANSFUSION? A transfusion is the replacement of blood or some of its parts. Blood is made up of multiple cells which provide different functions.  Red blood cells carry oxygen and are used for blood loss replacement.  White blood cells fight against infection.  Platelets control bleeding.  Plasma helps clot blood.  Other blood products are available for specialized needs, such as hemophilia or other clotting disorders. BEFORE THE TRANSFUSION  Who gives blood for transfusions?   You may be able to donate blood to be used at a later date on yourself (autologous donation).  Relatives can be asked to donate blood. This is generally not any safer than if you have received blood from a stranger. The same precautions are taken to ensure safety when a relative's blood is donated.  Healthy volunteers who are fully evaluated to make sure their blood is safe. This is blood bank blood. Transfusion therapy is the safest it has ever been in the practice of medicine. Before blood is taken from a donor, a complete history is taken to make sure that person has no history of diseases nor engages in risky social behavior  (examples are intravenous drug use or sexual activity with multiple partners). The donor's travel history is screened to minimize risk of transmitting infections, such as malaria. The donated blood is tested for signs of infectious diseases, such as HIV and hepatitis. The blood is then tested to be sure it is compatible with you in order to minimize the chance of a transfusion reaction. If you or a relative donates blood, this is often done in anticipation of surgery and is not appropriate for emergency situations. It takes many days to process the donated blood. RISKS AND COMPLICATIONS Although transfusion therapy is very safe and saves many lives, the main dangers of transfusion include:   Getting an infectious disease.  Developing a transfusion reaction. This is an allergic reaction to something in the blood you were given. Every precaution is taken to prevent this. The decision to have a blood transfusion has been considered carefully by your caregiver before blood is given. Blood is not given unless the benefits outweigh the risks. AFTER THE TRANSFUSION  Right after receiving a blood transfusion, you will usually feel much better and more energetic. This is especially true if your red blood cells have gotten low (anemic). The transfusion raises the level of the red blood cells which carry oxygen, and this usually causes an energy increase.  The nurse  administering the transfusion will monitor you carefully for complications. HOME CARE INSTRUCTIONS  No special instructions are needed after a transfusion. You may find your energy is better. Speak with your caregiver about any limitations on activity for underlying diseases you may have. SEEK MEDICAL CARE IF:   Your condition is not improving after your transfusion.  You develop redness or irritation at the intravenous (IV) site. SEEK IMMEDIATE MEDICAL CARE IF:  Any of the following symptoms occur over the next 12 hours:  Shaking  chills.  You have a temperature by mouth above 102 F (38.9 C), not controlled by medicine.  Chest, back, or muscle pain.  People around you feel you are not acting correctly or are confused.  Shortness of breath or difficulty breathing.  Dizziness and fainting.  You get a rash or develop hives.  You have a decrease in urine output.  Your urine turns a dark color or changes to pink, red, or brown. Any of the following symptoms occur over the next 10 days:  You have a temperature by mouth above 102 F (38.9 C), not controlled by medicine.  Shortness of breath.  Weakness after normal activity.  The white part of the eye turns yellow (jaundice).  You have a decrease in the amount of urine or are urinating less often.  Your urine turns a dark color or changes to pink, red, or brown. Document Released: 10/01/2000 Document Revised: 12/27/2011 Document Reviewed: 05/20/2008 [facility] Patient Information 2014 [place], [place].

## 2013-05-12 LAB — TYPE AND SCREEN
ABO/RH(D): A NEG
Unit division: 0
Unit division: 0

## 2013-05-14 ENCOUNTER — Encounter: Payer: Self-pay | Admitting: Hematology & Oncology

## 2013-05-15 ENCOUNTER — Ambulatory Visit (HOSPITAL_BASED_OUTPATIENT_CLINIC_OR_DEPARTMENT_OTHER): Payer: Medicare Other

## 2013-05-15 ENCOUNTER — Ambulatory Visit (HOSPITAL_BASED_OUTPATIENT_CLINIC_OR_DEPARTMENT_OTHER): Payer: Medicare Other | Admitting: Lab

## 2013-05-15 DIAGNOSIS — C9000 Multiple myeloma not having achieved remission: Secondary | ICD-10-CM

## 2013-05-15 DIAGNOSIS — Z5111 Encounter for antineoplastic chemotherapy: Secondary | ICD-10-CM

## 2013-05-15 DIAGNOSIS — Z452 Encounter for adjustment and management of vascular access device: Secondary | ICD-10-CM

## 2013-05-15 LAB — CMP (CANCER CENTER ONLY)
AST: 39 U/L — ABNORMAL HIGH (ref 11–38)
Albumin: 3.3 g/dL (ref 3.3–5.5)
Alkaline Phosphatase: 127 U/L — ABNORMAL HIGH (ref 26–84)
Chloride: 105 mEq/L (ref 98–108)
Potassium: 4.6 mEq/L (ref 3.3–4.7)
Sodium: 143 mEq/L (ref 128–145)
Total Protein: 6 g/dL — ABNORMAL LOW (ref 6.4–8.1)

## 2013-05-15 LAB — CBC WITH DIFFERENTIAL (CANCER CENTER ONLY)
EOS%: 6.5 % (ref 0.0–7.0)
Eosinophils Absolute: 0.3 10*3/uL (ref 0.0–0.5)
LYMPH#: 0.7 10*3/uL — ABNORMAL LOW (ref 0.9–3.3)
MCH: 32.8 pg (ref 26.0–34.0)
MCHC: 31.7 g/dL — ABNORMAL LOW (ref 32.0–36.0)
MONO%: 14.3 % — ABNORMAL HIGH (ref 0.0–13.0)
NEUT#: 2.8 10*3/uL (ref 1.5–6.5)
Platelets: 25 10*3/uL — ABNORMAL LOW (ref 145–400)
RBC: 3.2 10*6/uL — ABNORMAL LOW (ref 3.70–5.32)

## 2013-05-15 MED ORDER — ALTEPLASE 2 MG IJ SOLR
2.0000 mg | Freq: Once | INTRAMUSCULAR | Status: AC | PRN
  Administered 2013-05-15: 2 mg
  Filled 2013-05-15: qty 2

## 2013-05-15 MED ORDER — SODIUM CHLORIDE 0.9 % IJ SOLN
10.0000 mL | INTRAMUSCULAR | Status: DC | PRN
Start: 2013-05-15 — End: 2013-05-15
  Administered 2013-05-15: 10 mL
  Filled 2013-05-15: qty 10

## 2013-05-15 MED ORDER — HEPARIN SOD (PORK) LOCK FLUSH 100 UNIT/ML IV SOLN
250.0000 [IU] | Freq: Once | INTRAVENOUS | Status: AC | PRN
  Administered 2013-05-15: 250 [IU]
  Filled 2013-05-15: qty 5

## 2013-05-15 MED ORDER — SODIUM CHLORIDE 0.9 % IV SOLN
150.0000 mg/m2 | Freq: Once | INTRAVENOUS | Status: AC
  Administered 2013-05-15: 220 mg via INTRAVENOUS
  Filled 2013-05-15: qty 11

## 2013-05-15 MED ORDER — SODIUM CHLORIDE 0.9 % IV SOLN
Freq: Once | INTRAVENOUS | Status: AC
  Administered 2013-05-15: 13:00:00 via INTRAVENOUS

## 2013-05-15 MED ORDER — ONDANSETRON 8 MG/50ML IVPB (CHCC)
8.0000 mg | Freq: Once | INTRAVENOUS | Status: AC
  Administered 2013-05-15: 8 mg via INTRAVENOUS

## 2013-05-15 MED ORDER — DEXAMETHASONE SODIUM PHOSPHATE 20 MG/5ML IJ SOLN
40.0000 mg | Freq: Once | INTRAMUSCULAR | Status: AC
  Administered 2013-05-15: 40 mg via INTRAVENOUS
  Filled 2013-05-15: qty 10

## 2013-05-15 NOTE — Progress Notes (Signed)
Used Cathflo for patient access due to lack of blood return.  Excellent blood return noted in both ports after cath flo prior to chemo

## 2013-05-15 NOTE — Patient Instructions (Addendum)
[facility] Discharge Instructions for Patients Receiving Chemotherapy  Today you received the following chemotherapy agents Cytoxan  To help prevent nausea and vomiting after your treatment, we encourage you to take your nausea medication    If you develop nausea and vomiting that is not controlled by your nausea medication, call the clinic.   BELOW ARE SYMPTOMS THAT SHOULD BE REPORTED IMMEDIATELY:  *FEVER GREATER THAN 100.5 F  *CHILLS WITH OR WITHOUT FEVER  NAUSEA AND VOMITING THAT IS NOT CONTROLLED WITH YOUR NAUSEA MEDICATION  *UNUSUAL SHORTNESS OF BREATH  *UNUSUAL BRUISING OR BLEEDING  TENDERNESS IN MOUTH AND THROAT WITH OR WITHOUT PRESENCE OF ULCERS  *URINARY PROBLEMS  *BOWEL PROBLEMS  UNUSUAL RASH Items with * indicate a potential emergency and should be followed up as soon as possible.  Feel free to call the clinic you have any questions or concerns. The clinic phone number is [phone].

## 2013-05-16 ENCOUNTER — Ambulatory Visit (HOSPITAL_BASED_OUTPATIENT_CLINIC_OR_DEPARTMENT_OTHER): Payer: Medicare Other

## 2013-05-16 VITALS — BP 139/73 | HR 77 | Temp 98.1°F | Resp 18

## 2013-05-16 DIAGNOSIS — C9000 Multiple myeloma not having achieved remission: Secondary | ICD-10-CM

## 2013-05-16 DIAGNOSIS — Z5112 Encounter for antineoplastic immunotherapy: Secondary | ICD-10-CM

## 2013-05-16 DIAGNOSIS — N189 Chronic kidney disease, unspecified: Secondary | ICD-10-CM

## 2013-05-16 MED ORDER — HEPARIN SOD (PORK) LOCK FLUSH 100 UNIT/ML IV SOLN
250.0000 [IU] | Freq: Once | INTRAVENOUS | Status: DC | PRN
  Filled 2013-05-16: qty 5

## 2013-05-16 MED ORDER — SODIUM CHLORIDE 0.9 % IJ SOLN
10.0000 mL | INTRAMUSCULAR | Status: DC | PRN
  Filled 2013-05-16: qty 10

## 2013-05-16 MED ORDER — DEXTROSE 5 % IV SOLN
20.0000 mg/m2 | Freq: Once | INTRAVENOUS | Status: AC
  Administered 2013-05-16: 30 mg via INTRAVENOUS
  Filled 2013-05-16: qty 15

## 2013-05-16 MED ORDER — SODIUM CHLORIDE 0.9 % IV SOLN
Freq: Once | INTRAVENOUS | Status: AC
  Administered 2013-05-16: 14:00:00 via INTRAVENOUS

## 2013-05-16 MED ORDER — SODIUM CHLORIDE 0.9 % IV SOLN: Freq: Once | INTRAVENOUS | Status: DC

## 2013-05-16 MED ORDER — ONDANSETRON 8 MG/50ML IVPB (CHCC)
8.0000 mg | Freq: Once | INTRAVENOUS | Status: AC
  Administered 2013-05-16: 8 mg via INTRAVENOUS

## 2013-05-16 MED ORDER — ZOLEDRONIC ACID 4 MG/5ML IV CONC
3.3000 mg | Freq: Once | INTRAVENOUS | Status: AC
  Administered 2013-05-16: 3.3 mg via INTRAVENOUS
  Filled 2013-05-16: qty 4.13

## 2013-05-16 NOTE — Patient Instructions (Addendum)
[facility] Discharge Instructions for Patients Receiving Chemotherapy  Today you received the following chemotherapy agents Kyprolis  To help prevent nausea and vomiting after your treatment, we encourage you to take your nausea medication    If you develop nausea and vomiting that is not controlled by your nausea medication, call the clinic.   BELOW ARE SYMPTOMS THAT SHOULD BE REPORTED IMMEDIATELY:  *FEVER GREATER THAN 100.5 F  *CHILLS WITH OR WITHOUT FEVER  NAUSEA AND VOMITING THAT IS NOT CONTROLLED WITH YOUR NAUSEA MEDICATION  *UNUSUAL SHORTNESS OF BREATH  *UNUSUAL BRUISING OR BLEEDING  TENDERNESS IN MOUTH AND THROAT WITH OR WITHOUT PRESENCE OF ULCERS  *URINARY PROBLEMS  *BOWEL PROBLEMS  UNUSUAL RASH Items with * indicate a potential emergency and should be followed up as soon as possible.  Feel free to call the clinic you have any questions or concerns. The clinic phone number is [phone].     Zoledronic Acid injection (Hypercalcemia, Oncology) What is this medicine? ZOLEDRONIC ACID (ZOE le dron ik AS id) lowers the amount of calcium loss from bone. It is used to treat too much calcium in your blood from cancer. It is also used to prevent complications of cancer that has spread to the bone. This medicine may be used for other purposes; ask your health care provider or pharmacist if you have questions. What should I tell my health care provider before I take this medicine? They need to know if you have any of these conditions: -aspirin-sensitive asthma -dental disease -kidney disease -an unusual or allergic reaction to zoledronic acid, other medicines, foods, dyes, or preservatives -pregnant or trying to get pregnant -breast-feeding How should I use this medicine? This medicine is for infusion into a vein. It is given by a health care professional in a hospital or clinic setting. Talk to your pediatrician regarding the use of this medicine in  children. Special care may be needed. Overdosage: If you think you have taken too much of this medicine contact a poison control center or emergency room at once. NOTE: This medicine is only for you. Do not share this medicine with others. What if I miss a dose? It is important not to miss your dose. Call your doctor or health care professional if you are unable to keep an appointment. What may interact with this medicine? -certain antibiotics given by injection -NSAIDs, medicines for pain and inflammation, like ibuprofen or naproxen -some diuretics like bumetanide, furosemide -teriparatide -thalidomide This list may not describe all possible interactions. Give your health care provider a list of all the medicines, herbs, non-prescription drugs, or dietary supplements you use. Also tell them if you smoke, drink alcohol, or use illegal drugs. Some items may interact with your medicine. What should I watch for while using this medicine? Visit your doctor or health care professional for regular checkups. It may be some time before you see the benefit from this medicine. Do not stop taking your medicine unless your doctor tells you to. Your doctor may order blood tests or other tests to see how you are doing. Women should inform their doctor if they wish to become pregnant or think they might be pregnant. There is a potential for serious side effects to an unborn child. Talk to your health care professional or pharmacist for more information. You should make sure that you get enough calcium and vitamin D while you are taking this medicine. Discuss the foods you eat and the vitamins you take with your health  care professional. Some people who take this medicine have severe bone, joint, and/or muscle pain. This medicine may also increase your risk for a broken thigh bone. Tell your doctor right away if you have pain in your upper leg or groin. Tell your doctor if you have any pain that does not go away or  that gets worse. What side effects may I notice from receiving this medicine? Side effects that you should report to your doctor or health care professional as soon as possible: -allergic reactions like skin rash, itching or hives, swelling of the face, lips, or tongue -anxiety, confusion, or depression -breathing problems -changes in vision -feeling faint or lightheaded, falls -jaw burning, cramping, pain -muscle cramps, stiffness, or weakness -trouble passing urine or change in the amount of urine Side effects that usually do not require medical attention (report to your doctor or health care professional if they continue or are bothersome): -bone, joint, or muscle pain -fever -hair loss -irritation at site where injected -loss of appetite -nausea, vomiting -stomach upset -tired This list may not describe all possible side effects. Call your doctor for medical advice about side effects. You may report side effects to FDA at 1-800-FDA-1088. Where should I keep my medicine? This drug is given in a hospital or clinic and will not be stored at home. NOTE: This sheet is a summary. It may not cover all possible information. If you have questions about this medicine, talk to your doctor, pharmacist, or health care provider.  2013, Elsevier/Gold Standard. (04/02/2011 9:06:58 AM)

## 2013-05-17 ENCOUNTER — Ambulatory Visit (HOSPITAL_BASED_OUTPATIENT_CLINIC_OR_DEPARTMENT_OTHER): Payer: Medicare Other

## 2013-05-17 VITALS — BP 109/59 | HR 80 | Temp 97.1°F | Resp 20

## 2013-05-17 DIAGNOSIS — C9000 Multiple myeloma not having achieved remission: Secondary | ICD-10-CM

## 2013-05-17 DIAGNOSIS — C9002 Multiple myeloma in relapse: Secondary | ICD-10-CM

## 2013-05-17 DIAGNOSIS — N189 Chronic kidney disease, unspecified: Secondary | ICD-10-CM

## 2013-05-17 DIAGNOSIS — D630 Anemia in neoplastic disease: Secondary | ICD-10-CM

## 2013-05-17 MED ORDER — ACETAMINOPHEN 325 MG PO TABS
650.0000 mg | ORAL_TABLET | Freq: Once | ORAL | Status: AC
  Administered 2013-05-17: 650 mg via ORAL

## 2013-05-17 MED ORDER — SODIUM CHLORIDE 0.9 % IJ SOLN
10.0000 mL | INTRAMUSCULAR | Status: AC | PRN
  Administered 2013-05-17: 10 mL
  Filled 2013-05-17: qty 10

## 2013-05-17 MED ORDER — DIPHENHYDRAMINE HCL 25 MG PO CAPS
25.0000 mg | ORAL_CAPSULE | Freq: Once | ORAL | Status: AC
  Administered 2013-05-17: 25 mg via ORAL

## 2013-05-17 MED ORDER — HEPARIN SOD (PORK) LOCK FLUSH 100 UNIT/ML IV SOLN
500.0000 [IU] | INTRAVENOUS | Status: DC | PRN
  Filled 2013-05-17: qty 5

## 2013-05-17 MED ORDER — DIPHENHYDRAMINE HCL 25 MG PO TABS
25.0000 mg | ORAL_TABLET | Freq: Once | ORAL | Status: DC
  Filled 2013-05-17: qty 1

## 2013-05-17 MED ORDER — SODIUM CHLORIDE 0.9 % IJ SOLN
10.0000 mL | INTRAMUSCULAR | Status: DC | PRN
  Filled 2013-05-17: qty 10

## 2013-05-17 MED ORDER — IMMUNE GLOBULIN (HUMAN) 20 GM/200ML IV SOLN
0.7000 g/kg | Freq: Once | INTRAVENOUS | Status: AC
  Administered 2013-05-17: 40 g via INTRAVENOUS
  Filled 2013-05-17: qty 400

## 2013-05-17 MED ORDER — HEPARIN SOD (PORK) LOCK FLUSH 100 UNIT/ML IV SOLN
250.0000 [IU] | INTRAVENOUS | Status: AC | PRN
  Administered 2013-05-17: 15:00:00
  Filled 2013-05-17: qty 5

## 2013-05-17 NOTE — Patient Instructions (Signed)

## 2013-05-21 ENCOUNTER — Telehealth: Payer: Self-pay | Admitting: Hematology & Oncology

## 2013-05-21 NOTE — Telephone Encounter (Signed)
Per RN cx 8-5 and 6

## 2013-05-22 ENCOUNTER — Other Ambulatory Visit: Payer: Medicare Other | Admitting: Lab

## 2013-05-22 ENCOUNTER — Encounter (HOSPITAL_COMMUNITY): Payer: Self-pay

## 2013-05-22 ENCOUNTER — Ambulatory Visit (HOSPITAL_BASED_OUTPATIENT_CLINIC_OR_DEPARTMENT_OTHER): Payer: Medicare Other | Admitting: Hematology & Oncology

## 2013-05-22 ENCOUNTER — Ambulatory Visit (HOSPITAL_COMMUNITY)
Admission: RE | Admit: 2013-05-22 | Discharge: 2013-05-22 | Disposition: A | Discharge status: Home / Self Care | Payer: Medicare Other | Source: Ambulatory Visit | Attending: Hematology & Oncology | Admitting: Hematology & Oncology

## 2013-05-22 ENCOUNTER — Ambulatory Visit: Payer: Medicare Other

## 2013-05-22 VITALS — BP 123/68 | HR 92 | Temp 98.3°F | Resp 16 | Ht 59.0 in | Wt 117.0 lb

## 2013-05-22 DIAGNOSIS — Z452 Encounter for adjustment and management of vascular access device: Secondary | ICD-10-CM | POA: Insufficient documentation

## 2013-05-22 DIAGNOSIS — D61818 Other pancytopenia: Secondary | ICD-10-CM | POA: Insufficient documentation

## 2013-05-22 DIAGNOSIS — C9002 Multiple myeloma in relapse: Secondary | ICD-10-CM | POA: Insufficient documentation

## 2013-05-22 DIAGNOSIS — C9000 Multiple myeloma not having achieved remission: Secondary | ICD-10-CM

## 2013-05-22 LAB — CBC WITH DIFFERENTIAL/PLATELET
Basophils Relative: 0 % (ref 0–1)
Eosinophils Relative: 4 % (ref 0–5)
Hemoglobin: 9.6 g/dL — ABNORMAL LOW (ref 12.0–15.0)
MCH: 32.8 pg (ref 26.0–34.0)
MCV: 100.7 fL — ABNORMAL HIGH (ref 78.0–100.0)
Monocytes Absolute: 0.4 10*3/uL (ref 0.1–1.0)
Monocytes Relative: 12 % (ref 3–12)
Neutrophils Relative %: 68 % (ref 43–77)
RBC: 2.93 MIL/uL — ABNORMAL LOW (ref 3.87–5.11)
WBC: 3.3 10*3/uL — ABNORMAL LOW (ref 4.0–10.5)

## 2013-05-22 MED ORDER — MIDAZOLAM HCL 10 MG/2ML IJ SOLN
10.0000 mg | Freq: Once | INTRAMUSCULAR | Status: DC
  Filled 2013-05-22: qty 2

## 2013-05-22 MED ORDER — SODIUM CHLORIDE 0.9 % IJ SOLN
10.0000 mL | Freq: Once | INTRAMUSCULAR | Status: AC
  Administered 2013-05-22: 10 mL via INTRAVENOUS

## 2013-05-22 MED ORDER — MEPERIDINE HCL 25 MG/ML IJ SOLN
INTRAMUSCULAR | Status: AC | PRN
  Administered 2013-05-22 (×4): 12.5 mg via INTRAVENOUS

## 2013-05-22 MED ORDER — MEPERIDINE HCL 50 MG/ML IJ SOLN
50.0000 mg | Freq: Once | INTRAMUSCULAR | Status: DC
  Filled 2013-05-22: qty 1

## 2013-05-22 MED ORDER — MIDAZOLAM HCL 2 MG/2ML IJ SOLN
INTRAMUSCULAR | Status: AC | PRN
  Administered 2013-05-22 (×3): 1 mg via INTRAVENOUS
  Administered 2013-05-22: 2 mg via INTRAVENOUS

## 2013-05-22 NOTE — ED Notes (Signed)
Procedure ends and pt placed supine with dressing to left post iliac area with gauze and hypafix towel t site for pressure

## 2013-05-22 NOTE — ED Notes (Signed)
Stood at bedside and tolerated this well. Dressing remains CDI

## 2013-05-22 NOTE — Progress Notes (Signed)
This office note has been dictated.

## 2013-05-22 NOTE — ED Notes (Signed)
Vital signs stable. 

## 2013-05-22 NOTE — Progress Notes (Signed)
Right arm with dual Lumen PICC. Unable to aspirate blood from either port. Both flush easily with saline 10 cc. Peripheral IV started in left arm for Bone Marrow Biopsy with sedation(CBC was also drawn at this time and tubed to lab). Lack of blood return in PICC reported to Dr Myna Hidalgo and only orders received were to flush each lumen with saline ( as patient does at home) Platelet count of  "34" reported to Dr Myna Hidalgo . Both lumens of PICC flushed easily with 10cc saline caps tightened and both clamped . Dressing CDI and site WNL

## 2013-05-22 NOTE — ED Notes (Signed)
Dressing post iliac site CDI

## 2013-05-22 NOTE — ED Notes (Signed)
Patient denies pain and is resting comfortably.  

## 2013-05-22 NOTE — ED Notes (Signed)
Dressing to post iliac area CDI. Pt remains supine with towel to site for pressure

## 2013-05-22 NOTE — Sedation Documentation (Signed)
Medication dose calculated and verified [id number] 50 mg IV and VERSED 5 mg IV

## 2013-05-22 NOTE — ED Notes (Signed)
[name] updated as to patient's status.

## 2013-05-23 ENCOUNTER — Ambulatory Visit: Payer: Medicare Other

## 2013-05-23 NOTE — Procedures (Signed)
[name] was brought to the short-stay unit at [facility].  We tried to access her PICC line but could not.  As such, she had peripheral placed.  Her Mallampati score is 2.  Her ASA class is 2.  We did the appropriate time-out procedure on her.  She was then placed onto her right side.  We went ahead and gave her a total of, I think, 50 mg of Demerol and 5 mg of Versed for IV sedation.  The left posterior iliac crest region was prepped and draped in sterile fashion.  5 cc of 2% lidocaine were infiltrated under the skin down to the periosteum.  We using an #11 scalpel to make an incision into the skin.  We obtained 2 bone marrow aspirates without difficulty.  One aspirate was sent for flow cytometry and cytogenetics.  We then made a 2nd incision into the skin with an #11 scalpel.  A good bone marrow biopsy core was obtained with a Jamshidi biopsy needle.  We dressed the procedure site sterilely.  [name] tolerated the procedure well.  There were no complications.    ______________________________ Josph Macho, M.D. PRE/MEDQ  D:  05/22/2013  T:  05/23/2013  Job:  [id number]

## 2013-05-29 ENCOUNTER — Ambulatory Visit (HOSPITAL_BASED_OUTPATIENT_CLINIC_OR_DEPARTMENT_OTHER): Payer: Medicare Other

## 2013-05-29 ENCOUNTER — Ambulatory Visit (HOSPITAL_BASED_OUTPATIENT_CLINIC_OR_DEPARTMENT_OTHER): Payer: Medicare Other | Admitting: Lab

## 2013-05-29 ENCOUNTER — Ambulatory Visit (HOSPITAL_BASED_OUTPATIENT_CLINIC_OR_DEPARTMENT_OTHER): Payer: Medicare Other | Admitting: Hematology & Oncology

## 2013-05-29 VITALS — BP 88/51 | HR 96 | Resp 20 | Wt 116.0 lb

## 2013-05-29 VITALS — BP 122/65 | HR 81 | Temp 98.4°F | Resp 18

## 2013-05-29 DIAGNOSIS — D631 Anemia in chronic kidney disease: Secondary | ICD-10-CM

## 2013-05-29 DIAGNOSIS — C9000 Multiple myeloma not having achieved remission: Secondary | ICD-10-CM

## 2013-05-29 DIAGNOSIS — D649 Anemia, unspecified: Secondary | ICD-10-CM

## 2013-05-29 DIAGNOSIS — N189 Chronic kidney disease, unspecified: Secondary | ICD-10-CM

## 2013-05-29 DIAGNOSIS — R64 Cachexia: Secondary | ICD-10-CM

## 2013-05-29 LAB — CBC WITH DIFFERENTIAL (CANCER CENTER ONLY)
BASO%: 2.9 % — ABNORMAL HIGH (ref 0.0–2.0)
EOS%: 5.5 % (ref 0.0–7.0)
HCT: 29.1 % — ABNORMAL LOW (ref 34.8–46.6)
LYMPH#: 0.6 10*3/uL — ABNORMAL LOW (ref 0.9–3.3)
MCHC: 32.3 g/dL (ref 32.0–36.0)
MONO#: 0.5 10*3/uL (ref 0.1–0.9)
NEUT#: 2 10*3/uL (ref 1.5–6.5)
Platelets: 52 10*3/uL — ABNORMAL LOW (ref 145–400)
RDW: 20.1 % — ABNORMAL HIGH (ref 11.1–15.7)
WBC: 3.4 10*3/uL — ABNORMAL LOW (ref 3.9–10.0)

## 2013-05-29 LAB — CMP (CANCER CENTER ONLY)
ALT(SGPT): 60 U/L — ABNORMAL HIGH (ref 10–47)
CO2: 28 mEq/L (ref 18–33)
Calcium: 9.9 mg/dL (ref 8.0–10.3)
Chloride: 104 mEq/L (ref 98–108)
Creat: 2.1 mg/dl — ABNORMAL HIGH (ref 0.6–1.2)
Glucose, Bld: 101 mg/dL (ref 73–118)
Total Bilirubin: 0.9 mg/dl (ref 0.20–1.60)

## 2013-05-29 LAB — TECHNOLOGIST REVIEW CHCC SATELLITE

## 2013-05-29 MED ORDER — DRONABINOL 2.5 MG PO CAPS: 2.5000 mg | ORAL_CAPSULE | Freq: Two times a day (BID) | ORAL | Status: DC

## 2013-05-29 MED ORDER — LORAZEPAM 0.5 MG PO TABS: ORAL_TABLET | ORAL | Status: DC

## 2013-05-29 NOTE — Progress Notes (Signed)
Pt was discharged with son. She was ambulatory with her walker. Picc site CDI. No evidence of bleeding or discomfort. VSS. Pt was able to verbalize understanding of home maintenance and when to contact the appropriate authorities if she experience any adverse effects.

## 2013-05-29 NOTE — Progress Notes (Signed)
This office note has been dictated.

## 2013-05-29 NOTE — Patient Instructions (Addendum)
Peripherally Inserted Central Catheter (PICC) Removal and Care After A peripherally inserted catheter (PICC) is removed when it is no longer needed, when it is clotted, or when it may be infected.  PROCEDURE  The removal of a PICC is usually painless. Removing the tape that holds the PICC in place may be the most discomfort you have.  A physicians order needs to be obtained to have the PICC removed.  A PICC can be removed in the hospital or in an outpatient setting.  Never remove or take out the PICC yourself. Only a trained clinical professional, such as a PICC nurse, should remove the PICC.  If a PICC is suspected to be infected, the PICC tip is sent to the lab for culture. HOME CARE INSTRUCTIONS  When the PICC is out, pressure is applied at the insertion site to prevent bleeding. An antibiotic ointment may be applied to the insertion site. A dry, sterile gauze is then taped over the insertion site. This dressing should stay on for 24 hours.  After the 24 hours is up, the dressing may be removed. The PICC insertion site is very small. A small scab may develop over the insertion site. It is okay to wash the site gently with soap and water. Be careful to not remove or pick the scab off. After washing, gently pat the site dry. You do not need to put another dressing over the insertion site after you wash it.  Avoid heavy, strenuous physical activity for 24 hours after the PICC is removed. This includes things like:  Weight lifting.  Strenuous yard work.  Any physical activity with repetitive arm movement. SEEK MEDICAL CARE IF:  Call or see your caregiver as soon as possible if you develop the following conditions in the arm in which the PICC was inserted:  Swelling or puffiness.  Increasing tenderness or pain. SEEK IMMEDIATE MEDICAL CARE IF:  You develop any of the following conditions in the arm that had the PICC:  Numbness or tingling in your fingers, hand, or arm.  You arm has  a bluish color and it is cold to the touch.  Redness around the insertion site or a red-streak that goes up your arm.  Any type of drainage from the PICC insertion site. This includes drainage such as:  Bleeding from the insertion site. (If this happens, apply firm, direct pressure to the PICC insertion site with a clean towel.)  Drainage that is yellow or tan in color.  You have an oral temperature above 102 F (38.9 C), not controlled by medicine. Document Released: 03/24/2010 Document Revised: 12/27/2011 Document Reviewed: 03/24/2010 [employer] Patient Information 2014 [employer].  

## 2013-05-29 NOTE — Progress Notes (Signed)
DIAGNOSES: 1. Refractory kappa light chain myeloma. 2. Anemia secondary to renal insufficiency.  CURRENT THERAPY: 1. Patient status post Kyprolis/Cytoxan/Decadron. 2. IVIG monthly. 3. Zometa 3.3 mg IV monthly. 4. Aranesp 300 mcg subcu as needed for hemoglobin less than 10.  INTERIM HISTORY:  Ms. Greenfield comes in for followup.  We did go ahead and do a bone marrow biopsy on her last week.  She had just finished a course of chemo with Cytoxan and Kyprolis.  The bone marrow report ([id number]) showed 34% plasma cells.  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  We did do cytogenetics.  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  At this point, we really need to hold on therapy for her.  I think that her bone marrow is starting to show more "wear and tear" from all of her chemo.  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  As such, she is clearly at significant risk for a treatment-related myelodysplasia.  To me, her bone marrow really does not show much of a change.  This, I think, is encouraging.  She has her days in which she feels good and days in which she does not feel too good.  She has had no bleeding.  She has had no cough.  There has been no leg swelling.  She has had no rashes.  She and her family are getting ready go away for Labor Day weekend.  I want to make sure that she feels well for this.  PHYSICAL EXAMINATION:  General:  This is a petite white female in no obvious distress.  Vital signs:  Temperature of 98.3, pulse 96, respiratory rate 20, blood pressure 88/51.  Weight is 116 pounds.  Head and neck:  Normocephalic, atraumatic skull.  There are no ocular or oral lesions.  There are no palpable cervical or supraclavicular lymph nodes. Lungs:  Clear bilaterally.  Cardiac:  Regular rate and rhythm with a normal S1 and S2.  There  are no murmurs, rubs or bruits.  Abdomen: Soft.  She has good bowel sounds.  There is no fluid wave.  There is no palpable hepatosplenomegaly.  Extremities:  Show no clubbing, cyanosis or edema.  Neurologic:  Shows no focal neurological deficits.  LABORATORY STUDIES:  White cell count is 3.4, hemoglobin 9.4, hematocrit 29.1, platelet count 52,000.  BUN 29, creatinine 2.1.  Calcium is 9.9 with an albumin of 3.4.  IMPRESSION:  Ms. Belfield is a 67 year old white female with refractory kappa light chain myeloma.  She has literally been through all known chemotherapy for this.  She is out from initial diagnosis by about 20 years.  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  As such, I think that holding therapy on her would be a good idea.  We will still do supportive measures with IVIG and Zometa.  I also want make sure that we continue the Aranesp.  We will have her come back in 2 weeks for lab work to make sure that she is doing okay before her vacation.  I want to get her back to see me in about a month, at which point we will go ahead with her IVIG and Zometa.  I will hold on the Aranesp for right now.    ______________________________ Josph Macho, M.D. PRE/MEDQ  D:  05/29/2013  T:  05/29/2013  Job:  [id number]

## 2013-05-30 ENCOUNTER — Ambulatory Visit: Payer: Medicare Other

## 2013-05-30 ENCOUNTER — Telehealth: Payer: Self-pay | Admitting: Hematology & Oncology

## 2013-05-30 NOTE — Telephone Encounter (Signed)
Pt aware of 8-27 lab and 9-18 MD and IVIG. Per in basket IVIG 4 weeks MD said 5 weeks is ok

## 2013-05-31 ENCOUNTER — Other Ambulatory Visit: Payer: Self-pay | Admitting: *Deleted

## 2013-05-31 ENCOUNTER — Encounter: Payer: Self-pay | Admitting: Hematology & Oncology

## 2013-05-31 ENCOUNTER — Telehealth: Payer: Self-pay | Admitting: *Deleted

## 2013-05-31 ENCOUNTER — Other Ambulatory Visit: Payer: Self-pay | Admitting: Hematology & Oncology

## 2013-05-31 DIAGNOSIS — N189 Chronic kidney disease, unspecified: Secondary | ICD-10-CM

## 2013-05-31 DIAGNOSIS — C9000 Multiple myeloma not having achieved remission: Secondary | ICD-10-CM

## 2013-05-31 LAB — IGG, IGA, IGM
IgA: <7 mg/dL — ABNORMAL LOW (ref 69–380)
IgG (Immunoglobin G), Serum: 1220 mg/dL (ref 690–1700)
IgM, Serum: <4 mg/dL — ABNORMAL LOW (ref 52–322)

## 2013-05-31 LAB — ERYTHROPOIETIN: Erythropoietin: 85.5 m[IU]/mL — ABNORMAL HIGH (ref 2.6–18.5)

## 2013-05-31 LAB — KAPPA/LAMBDA LIGHT CHAINS: Kappa free light chain: 4.97 mg/dL — ABNORMAL HIGH (ref 0.33–1.94)

## 2013-05-31 MED ORDER — DRONABINOL 2.5 MG PO CAPS: 2.5000 mg | ORAL_CAPSULE | Freq: Two times a day (BID) | ORAL | Status: AC

## 2013-05-31 NOTE — Telephone Encounter (Signed)
Incorrect encounter

## 2013-06-01 LAB — CHROMOSOME ANALYSIS, BONE MARROW

## 2013-06-01 LAB — TISSUE HYBRIDIZATION (BONE MARROW)-NCBH

## 2013-06-07 ENCOUNTER — Encounter: Payer: Self-pay | Admitting: Hematology & Oncology

## 2013-06-13 ENCOUNTER — Other Ambulatory Visit (HOSPITAL_BASED_OUTPATIENT_CLINIC_OR_DEPARTMENT_OTHER): Payer: Medicare Other | Admitting: Lab

## 2013-06-13 DIAGNOSIS — D631 Anemia in chronic kidney disease: Secondary | ICD-10-CM

## 2013-06-13 DIAGNOSIS — D649 Anemia, unspecified: Secondary | ICD-10-CM

## 2013-06-13 DIAGNOSIS — C9000 Multiple myeloma not having achieved remission: Secondary | ICD-10-CM

## 2013-06-13 LAB — CMP (CANCER CENTER ONLY)
ALT(SGPT): 55 U/L — ABNORMAL HIGH (ref 10–47)
Albumin: 3.4 g/dL (ref 3.3–5.5)
Alkaline Phosphatase: 132 U/L — ABNORMAL HIGH (ref 26–84)
Potassium: 4.3 mEq/L (ref 3.3–4.7)
Sodium: 139 mEq/L (ref 128–145)
Total Bilirubin: 0.9 mg/dl (ref 0.20–1.60)
Total Protein: 6.4 g/dL (ref 6.4–8.1)

## 2013-06-13 LAB — CBC WITH DIFFERENTIAL (CANCER CENTER ONLY)
BASO%: 1.5 % (ref 0.0–2.0)
Eosinophils Absolute: 0.2 10*3/uL (ref 0.0–0.5)
LYMPH%: 18.7 % (ref 14.0–48.0)
MCH: 35 pg — ABNORMAL HIGH (ref 26.0–34.0)
MCV: 110 fL — ABNORMAL HIGH (ref 81–101)
MONO#: 0.6 10*3/uL (ref 0.1–0.9)
MONO%: 14.5 % — ABNORMAL HIGH (ref 0.0–13.0)
NEUT#: 2.4 10*3/uL (ref 1.5–6.5)
Platelets: 47 10*3/uL — ABNORMAL LOW (ref 145–400)
RBC: 2.26 10*6/uL — ABNORMAL LOW (ref 3.70–5.32)
RDW: 20 % — ABNORMAL HIGH (ref 11.1–15.7)
WBC: 4.1 10*3/uL (ref 3.9–10.0)

## 2013-06-14 ENCOUNTER — Encounter (HOSPITAL_COMMUNITY)
Admission: RE | Admit: 2013-06-14 | Discharge: 2013-06-14 | Disposition: A | Discharge status: Home / Self Care | Payer: Medicare Other | Source: Ambulatory Visit | Attending: Hematology & Oncology | Admitting: Hematology & Oncology

## 2013-06-14 ENCOUNTER — Other Ambulatory Visit: Payer: Self-pay | Admitting: *Deleted

## 2013-06-14 ENCOUNTER — Other Ambulatory Visit (HOSPITAL_BASED_OUTPATIENT_CLINIC_OR_DEPARTMENT_OTHER): Payer: Medicare Other | Admitting: Lab

## 2013-06-14 DIAGNOSIS — D631 Anemia in chronic kidney disease: Secondary | ICD-10-CM

## 2013-06-14 DIAGNOSIS — D649 Anemia, unspecified: Secondary | ICD-10-CM

## 2013-06-14 DIAGNOSIS — C9000 Multiple myeloma not having achieved remission: Secondary | ICD-10-CM

## 2013-06-14 DIAGNOSIS — N189 Chronic kidney disease, unspecified: Secondary | ICD-10-CM | POA: Insufficient documentation

## 2013-06-14 LAB — PREPARE RBC (CROSSMATCH)

## 2013-06-15 ENCOUNTER — Encounter: Payer: Self-pay | Admitting: Hematology & Oncology

## 2013-06-15 ENCOUNTER — Ambulatory Visit (HOSPITAL_BASED_OUTPATIENT_CLINIC_OR_DEPARTMENT_OTHER): Payer: Medicare Other

## 2013-06-15 VITALS — BP 110/58 | HR 81 | Temp 97.8°F | Resp 18

## 2013-06-15 DIAGNOSIS — N289 Disorder of kidney and ureter, unspecified: Secondary | ICD-10-CM

## 2013-06-15 DIAGNOSIS — C9000 Multiple myeloma not having achieved remission: Secondary | ICD-10-CM

## 2013-06-15 DIAGNOSIS — D631 Anemia in chronic kidney disease: Secondary | ICD-10-CM

## 2013-06-15 DIAGNOSIS — D649 Anemia, unspecified: Secondary | ICD-10-CM

## 2013-06-15 MED ORDER — FUROSEMIDE 10 MG/ML IJ SOLN: 10.0000 mg | Freq: Once | INTRAMUSCULAR | Status: DC

## 2013-06-15 MED ORDER — DIPHENHYDRAMINE HCL 25 MG PO CAPS
25.0000 mg | ORAL_CAPSULE | Freq: Once | ORAL | Status: AC
  Administered 2013-06-15: 25 mg via ORAL

## 2013-06-15 MED ORDER — ACETAMINOPHEN 325 MG PO TABS
650.0000 mg | ORAL_TABLET | Freq: Once | ORAL | Status: AC
  Administered 2013-06-15: 650 mg via ORAL

## 2013-06-15 MED ORDER — SODIUM CHLORIDE 0.9 % IV SOLN
250.0000 mL | Freq: Once | INTRAVENOUS | Status: AC
  Administered 2013-06-15: 250 mL via INTRAVENOUS

## 2013-06-15 NOTE — Patient Instructions (Signed)
Blood Transfusion Information WHAT IS A BLOOD TRANSFUSION? A transfusion is the replacement of blood or some of its parts. Blood is made up of multiple cells which provide different functions.  Red blood cells carry oxygen and are used for blood loss replacement.  White blood cells fight against infection.  Platelets control bleeding.  Plasma helps clot blood.  Other blood products are available for specialized needs, such as hemophilia or other clotting disorders. BEFORE THE TRANSFUSION  Who gives blood for transfusions?   You may be able to donate blood to be used at a later date on yourself (autologous donation).  Relatives can be asked to donate blood. This is generally not any safer than if you have received blood from a stranger. The same precautions are taken to ensure safety when a relative's blood is donated.  Healthy volunteers who are fully evaluated to make sure their blood is safe. This is blood bank blood. Transfusion therapy is the safest it has ever been in the practice of medicine. Before blood is taken from a donor, a complete history is taken to make sure that person has no history of diseases nor engages in risky social behavior (examples are intravenous drug use or sexual activity with multiple partners). The donor's travel history is screened to minimize risk of transmitting infections, such as malaria. The donated blood is tested for signs of infectious diseases, such as HIV and hepatitis. The blood is then tested to be sure it is compatible with you in order to minimize the chance of a transfusion reaction. If you or a relative donates blood, this is often done in anticipation of surgery and is not appropriate for emergency situations. It takes many days to process the donated blood. RISKS AND COMPLICATIONS Although transfusion therapy is very safe and saves many lives, the main dangers of transfusion include:   Getting an infectious disease.  Developing a  transfusion reaction. This is an allergic reaction to something in the blood you were given. Every precaution is taken to prevent this. The decision to have a blood transfusion has been considered carefully by your caregiver before blood is given. Blood is not given unless the benefits outweigh the risks. AFTER THE TRANSFUSION  Right after receiving a blood transfusion, you will usually feel much better and more energetic. This is especially true if your red blood cells have gotten low (anemic). The transfusion raises the level of the red blood cells which carry oxygen, and this usually causes an energy increase.  The nurse administering the transfusion will monitor you carefully for complications. HOME CARE INSTRUCTIONS  No special instructions are needed after a transfusion. You may find your energy is better. Speak with your caregiver about any limitations on activity for underlying diseases you may have. SEEK MEDICAL CARE IF:   Your condition is not improving after your transfusion.  You develop redness or irritation at the intravenous (IV) site. SEEK IMMEDIATE MEDICAL CARE IF:  Any of the following symptoms occur over the next 12 hours:  Shaking chills.  You have a temperature by mouth above 102 F (38.9 C), not controlled by medicine.  Chest, back, or muscle pain.  People around you feel you are not acting correctly or are confused.  Shortness of breath or difficulty breathing.  Dizziness and fainting.  You get a rash or develop hives.  You have a decrease in urine output.  Your urine turns a dark color or changes to pink, red, or brown. Any of the following   symptoms occur over the next 10 days:  You have a temperature by mouth above 102 F (38.9 C), not controlled by medicine.  Shortness of breath.  Weakness after normal activity.  The white part of the eye turns yellow (jaundice).  You have a decrease in the amount of urine or are urinating less often.  Your  urine turns a dark color or changes to pink, red, or brown. Document Released: 10/01/2000 Document Revised: 12/27/2011 Document Reviewed: 05/20/2008 ExitCare Patient Information 2014 [employer].  

## 2013-06-16 LAB — TYPE AND SCREEN
ABO/RH(D): A NEG
Unit division: 0

## 2013-06-19 ENCOUNTER — Telehealth: Payer: Self-pay | Admitting: Hematology & Oncology

## 2013-06-19 NOTE — Telephone Encounter (Signed)
[employer] has APPROVED the DRONABINOL CAP 2.5MG   and is good until 06/07/2016.       COPY SCANNED

## 2013-06-25 ENCOUNTER — Other Ambulatory Visit (HOSPITAL_COMMUNITY): Payer: Self-pay | Admitting: Hematology & Oncology

## 2013-07-04 ENCOUNTER — Other Ambulatory Visit: Payer: Self-pay | Admitting: *Deleted

## 2013-07-04 DIAGNOSIS — D801 Nonfamilial hypogammaglobulinemia: Secondary | ICD-10-CM

## 2013-07-04 DIAGNOSIS — C9000 Multiple myeloma not having achieved remission: Secondary | ICD-10-CM

## 2013-07-04 DIAGNOSIS — D631 Anemia in chronic kidney disease: Secondary | ICD-10-CM

## 2013-07-05 ENCOUNTER — Ambulatory Visit (HOSPITAL_BASED_OUTPATIENT_CLINIC_OR_DEPARTMENT_OTHER): Payer: Medicare Other | Admitting: Hematology & Oncology

## 2013-07-05 ENCOUNTER — Ambulatory Visit (HOSPITAL_BASED_OUTPATIENT_CLINIC_OR_DEPARTMENT_OTHER): Payer: Medicare Other

## 2013-07-05 ENCOUNTER — Other Ambulatory Visit (HOSPITAL_BASED_OUTPATIENT_CLINIC_OR_DEPARTMENT_OTHER): Payer: Medicare Other | Admitting: Lab

## 2013-07-05 VITALS — BP 100/50 | HR 87 | Temp 98.2°F | Resp 16 | Ht 59.0 in | Wt 118.0 lb

## 2013-07-05 VITALS — BP 123/61 | HR 79 | Temp 97.3°F | Resp 20

## 2013-07-05 DIAGNOSIS — C9000 Multiple myeloma not having achieved remission: Secondary | ICD-10-CM

## 2013-07-05 DIAGNOSIS — Z23 Encounter for immunization: Secondary | ICD-10-CM

## 2013-07-05 DIAGNOSIS — D801 Nonfamilial hypogammaglobulinemia: Secondary | ICD-10-CM

## 2013-07-05 DIAGNOSIS — N289 Disorder of kidney and ureter, unspecified: Secondary | ICD-10-CM

## 2013-07-05 DIAGNOSIS — D631 Anemia in chronic kidney disease: Secondary | ICD-10-CM

## 2013-07-05 DIAGNOSIS — Z1231 Encounter for screening mammogram for malignant neoplasm of breast: Secondary | ICD-10-CM

## 2013-07-05 DIAGNOSIS — D469 Myelodysplastic syndrome, unspecified: Secondary | ICD-10-CM

## 2013-07-05 DIAGNOSIS — N189 Chronic kidney disease, unspecified: Secondary | ICD-10-CM

## 2013-07-05 DIAGNOSIS — D649 Anemia, unspecified: Secondary | ICD-10-CM

## 2013-07-05 LAB — CBC WITH DIFFERENTIAL (CANCER CENTER ONLY)
BASO#: 0 10*3/uL (ref 0.0–0.2)
Eosinophils Absolute: 0.2 10*3/uL (ref 0.0–0.5)
HCT: 29.6 % — ABNORMAL LOW (ref 34.8–46.6)
HGB: 9.6 g/dL — ABNORMAL LOW (ref 11.6–15.9)
LYMPH%: 20.4 % (ref 14.0–48.0)
MCH: 34.4 pg — ABNORMAL HIGH (ref 26.0–34.0)
MCV: 106 fL — ABNORMAL HIGH (ref 81–101)
MONO#: 0.6 10*3/uL (ref 0.1–0.9)
MONO%: 14.9 % — ABNORMAL HIGH (ref 0.0–13.0)
NEUT%: 58.7 % (ref 39.6–80.0)
RBC: 2.79 10*6/uL — ABNORMAL LOW (ref 3.70–5.32)
WBC: 3.8 10*3/uL — ABNORMAL LOW (ref 3.9–10.0)

## 2013-07-05 LAB — CHCC SATELLITE - SMEAR

## 2013-07-05 MED ORDER — INFLUENZA VAC SPLIT QUAD 0.5 ML IM SUSP
0.5000 mL | INTRAMUSCULAR | Status: AC
  Administered 2013-07-05: 0.5 mL via INTRAMUSCULAR
  Filled 2013-07-05: qty 0.5

## 2013-07-05 MED ORDER — DARBEPOETIN ALFA-POLYSORBATE 300 MCG/0.6ML IJ SOLN
300.0000 ug | Freq: Once | INTRAMUSCULAR | Status: AC
  Administered 2013-07-05: 300 ug via SUBCUTANEOUS

## 2013-07-05 MED ORDER — ACETAMINOPHEN 325 MG PO TABS
650.0000 mg | ORAL_TABLET | Freq: Once | ORAL | Status: AC
  Administered 2013-07-05: 650 mg via ORAL

## 2013-07-05 MED ORDER — SODIUM CHLORIDE 0.9 % IV SOLN
Freq: Once | INTRAVENOUS | Status: AC
  Administered 2013-07-05: 11:00:00 via INTRAVENOUS

## 2013-07-05 MED ORDER — DIPHENHYDRAMINE HCL 25 MG PO CAPS
25.0000 mg | ORAL_CAPSULE | Freq: Once | ORAL | Status: AC
  Administered 2013-07-05: 25 mg via ORAL

## 2013-07-05 MED ORDER — IMMUNE GLOBULIN (HUMAN) 20 GM/200ML IV SOLN
0.7000 g/kg | Freq: Once | INTRAVENOUS | Status: AC
  Administered 2013-07-05: 40 g via INTRAVENOUS
  Filled 2013-07-05: qty 400

## 2013-07-05 MED ORDER — ZOLEDRONIC ACID 4 MG/5ML IV CONC
3.3000 mg | Freq: Once | INTRAVENOUS | Status: AC
  Administered 2013-07-05: 3.3 mg via INTRAVENOUS
  Filled 2013-07-05: qty 4.13

## 2013-07-05 NOTE — Patient Instructions (Signed)
iImmune Globulin Injection What is this medicine? IMMUNE GLOBULIN (im MUNE GLOB yoo lin) helps to prevent or reduce the severity of certain infections in patients who are at risk. This medicine is collected from the pooled blood of many donors. It is used to treat immune system problems, thrombocytopenia, and Kawasaki syndrome. This medicine may be used for other purposes; ask your health care provider or pharmacist if you have questions. What should I tell my health care provider before I take this medicine? They need to know if you have any of these conditions: - diabetes - extremely low or no immune antibodies in the blood - heart disease - history of blood clots - hyperprolinemia - infection in the blood, sepsis - kidney disease - taking medicine that may change kidney function - ask your health care provider about your medicine - an unusual or allergic reaction to human immune globulin, albumin, maltose, sucrose, polysorbate 80, other medicines, foods, dyes, or preservatives - pregnant or trying to get pregnant - breast-feeding How should I use this medicine? This medicine is for injection into a muscle or infusion into a vein or skin. It is usually given by a health care professional in a hospital or clinic setting. In rare cases, some brands of this medicine might be given at home. You will be taught how to give this medicine. Use exactly as directed. Take your medicine at regular intervals. Do not take your medicine more often than directed. Talk to your pediatrician regarding the use of this medicine in children. Special care may be needed. Overdosage: If you think you have taken too much of this medicine contact a poison control center or emergency room at once. NOTE: This medicine is only for you. Do not share this medicine with others. What if I miss a dose? It is important not to miss your dose. Call your doctor or health care professional if you are unable to keep an  appointment. If you give yourself the medicine and you miss a dose, take it as soon as you can. If it is almost time for your next dose, take only that dose. Do not take double or extra doses. What may interact with this medicine? -aspirin and aspirin-like medicines -cisplatin -cyclosporine -medicines for infection like acyclovir, adefovir, amphotericin B, bacitracin, cidofovir, foscarnet, ganciclovir, gentamicin, pentamidine, vancomycin -NSAIDS, medicines for pain and inflammation, like ibuprofen or naproxen -pamidronate -vaccines -zoledronic acid This list may not describe all possible interactions. Give your health care provider a list of all the medicines, herbs, non-prescription drugs, or dietary supplements you use. Also tell them if you smoke, drink alcohol, or use illegal drugs. Some items may interact with your medicine. What should I watch for while using this medicine? Your condition will be monitored carefully while you are receiving this medicine. This medicine is made from pooled blood donations of many different people. It may be possible to pass an infection in this medicine. However, the donors are screened for infections and all products are tested for HIV and hepatitis. The medicine is treated to kill most or all bacteria and viruses. Talk to your doctor about the risks and benefits of this medicine. Do not have vaccinations for at least 14 days before, or until at least 3 months after receiving this medicine. What side effects may I notice from receiving this medicine? Side effects that you should report to your doctor or health care professional as soon as possible: -allergic reactions like skin rash, itching or hives, swelling of  the face, lips, or tongue -breathing problems -chest pain or tightness -fever, chills -headache with nausea, vomiting -neck pain or difficulty moving neck -pain when moving eyes -pain, swelling, warmth in the leg -problems with balance,  talking, walking -sudden weight gain -swelling of the ankles, feet, hands -trouble passing urine or change in the amount of urine Side effects that usually do not require medical attention (report to your doctor or health care professional if they continue or are bothersome): -dizzy, drowsy -flushing -increased sweating -leg cramps -muscle aches and pains -pain at site where injected This list may not describe all possible side effects. Call your doctor for medical advice about side effects. You may report side effects to FDA at 1-800-FDA-1088. Where should I keep my medicine? Keep out of the reach of children. This drug is usually given in a hospital or clinic and will not be stored at home. In rare cases, some brands of this medicine may be given at home. If you are using this medicine at home, you will be instructed on how to store this medicine. Throw away any unused medicine after the expiration date on the label. NOTE: This sheet is a summary. It may not cover all possible information. If you have questions about this medicine, talk to your doctor, pharmacist, or health care provider.  2012, Elsevier/Gold Standard. (12/25/2008 11:44:49 AM) 

## 2013-07-06 ENCOUNTER — Other Ambulatory Visit (HOSPITAL_COMMUNITY): Payer: Self-pay | Admitting: Hematology & Oncology

## 2013-07-06 NOTE — Progress Notes (Signed)
This office note has been dictated.

## 2013-07-08 ENCOUNTER — Other Ambulatory Visit: Payer: Self-pay | Admitting: Hematology & Oncology

## 2013-07-10 LAB — COMPREHENSIVE METABOLIC PANEL
ALT: 59 U/L — ABNORMAL HIGH (ref 0–35)
AST: 33 U/L (ref 0–37)
Albumin: 3.8 g/dL (ref 3.5–5.2)
Alkaline Phosphatase: 141 U/L — ABNORMAL HIGH (ref 39–117)
Glucose, Bld: 89 mg/dL (ref 70–99)
Potassium: 3.8 mEq/L (ref 3.5–5.3)
Sodium: 140 mEq/L (ref 135–145)
Total Bilirubin: 1 mg/dL (ref 0.3–1.2)
Total Protein: 5.7 g/dL — ABNORMAL LOW (ref 6.0–8.3)

## 2013-07-10 LAB — PROTEIN ELECTROPHORESIS, SERUM, WITH REFLEX
Alpha-1-Globulin: 5.3 % — ABNORMAL HIGH (ref 2.9–4.9)
Alpha-2-Globulin: 12.6 % — ABNORMAL HIGH (ref 7.1–11.8)
Gamma Globulin: 7 % — ABNORMAL LOW (ref 11.1–18.8)
Total Protein, Serum Electrophoresis: 5.7 g/dL — ABNORMAL LOW (ref 6.0–8.3)

## 2013-07-10 LAB — RETICULOCYTES (CHCC)
ABS Retic: 46.1 10*3/uL (ref 19.0–186.0)
Retic Ct Pct: 1.6 % (ref 0.4–2.3)

## 2013-07-10 LAB — TRANSFERRIN RECEPTOR, SOLUABLE: Transferrin Receptor, Soluble: 0.6 mg/L — ABNORMAL LOW (ref 0.76–1.76)

## 2013-07-10 LAB — KAPPA/LAMBDA LIGHT CHAINS
Kappa:Lambda Ratio: 4.91 — ABNORMAL HIGH (ref 0.26–1.65)
Lambda Free Lght Chn: 1.36 mg/dL (ref 0.57–2.63)

## 2013-07-11 ENCOUNTER — Other Ambulatory Visit: Payer: Self-pay | Admitting: Hematology & Oncology

## 2013-07-17 NOTE — Progress Notes (Signed)
DIAGNOSES: 1. Refractory kappa light chain myeloma. 2. Anemia secondary to renal insufficiency, current. 3. Hypogammaglobulinemia.  CURRENT THERAPY: 1. IVIG IV 50 g IV monthly. 2. Zometa 3.3 mg IV monthly. 3. Aranesp 300 mcg subcu as needed for hemoglobin less than 10.  INTERIM HISTORY:  [name] comes in for a followup.  We have her off therapy right now.  She is doing fairly well.  She feels okay.  She is a bit tired.  She does not have any increasing bony pain.  She has been treated basically for 20 years or so for the myeloma.  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  She had myelodysplastic changes on her last bone marrow that we did. 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  Again, her quality of life is doing pretty well.  She went to the beach with her family over Labor Day weekend.  She had a nice time.  I was glad that she was able to enjoy.  She has had no fever.  There has been no increased cough.  She does have underlying lung disease.  There has been no nausea or vomiting.  She has had no headache.  She has bone pain, mostly in her I think right thigh, which has been chronic.  She is on a lot of medications.  Mostly, she says she only takes it as necessary.  We really have a hard time measuring her myeloma levels, as she does not have a lot of light chain in her serum.  Her last 9 kappa light chain back in early August was 5 mg/dL.  Again, this been holding pretty steady.  PHYSICAL EXAMINATION:  General:  This is a petite, white female in no obvious distress.  Vital signs:  Temperature 98.2, pulse 87, respiratory rate 16, blood pressure 100/50.  Weight:  118 pounds.  Head and neck: Normocephalic, atraumatic skull.  There are no ocular or oral lesions. Neck:  There are no palpable cervical or supraclavicular lymph nodes. Lungs:  Clear bilaterally.  Cardiac:  Regular rate and rhythm with  a normal S1 and S2.  There are no murmurs, rubs or bruits.  Abdomen: Soft.  She has good bowel sounds.  There is no fluid wave.  There is no palpable hepatosplenomegaly.  Extremities:  Show some changes which are consistent with some muscular changes from her past radiation.  No swelling is noted in her legs.  Neurological:  Shows no focal neurological deficits.  LABORATORY STUDIES:  White cell count is 3.8, hemoglobin 9.6, hematocrit 29.6, platelet count 47,000. Sodium 140, potassium 3.8, BUN 19, creatinine 1.5.  IMPRESSION:  [name] is a very nice 67 year old white female with kappa light chain myeloma.  This is refractory.  We have really been through most therapies that I can think of.  She is not a candidate for any systemic chemotherapy trials.  The real problem that we have is that she has myelodysplasia.  This is really going to limit how aggressive we can be with her.  Despite no therapy for a month or so, her platelet count still is not improved.  I think the best option that we will have for her is when the new monoclonal antibody comes out for myeloma.  We will go ahead and give her the IVIG today.  I will give her the Zometa and the Aranesp.  We will have her plan to come back to see  [place] in another 3 or 4 weeks.  She certainly can come back sooner if she has any issues.  I would still like to check her blood work in between visits.  I spent a good 40 minutes or so with [name] today.    ______________________________ Josph Macho, M.D. PRE/MEDQ  D:  07/06/2013  T:  07/17/2013  Job:  [id number]

## 2013-07-27 ENCOUNTER — Telehealth: Payer: Self-pay | Admitting: *Deleted

## 2013-07-27 ENCOUNTER — Other Ambulatory Visit: Payer: Self-pay | Admitting: Hematology & Oncology

## 2013-07-27 DIAGNOSIS — C9000 Multiple myeloma not having achieved remission: Secondary | ICD-10-CM

## 2013-07-27 DIAGNOSIS — N390 Urinary tract infection, site not specified: Secondary | ICD-10-CM

## 2013-07-27 MED ORDER — SULFAMETHOXAZOLE-TRIMETHOPRIM 800-160 MG PO TABS: 1.0000 | ORAL_TABLET | Freq: Two times a day (BID) | ORAL | Status: DC

## 2013-07-27 MED ORDER — HYDROCODONE-ACETAMINOPHEN 5-325 MG PO TABS: ORAL_TABLET | ORAL | Status: DC

## 2013-07-27 NOTE — Telephone Encounter (Signed)
Pt called with continued c/o right flank pain. Described it as muscular yesterday but wonders if she isn't developing a UTI. Reviewed with Dr Myna Hidalgo. Ok to give Vicodin rx (confirmed with pt that she IS NOT allergic to it) and Bactrim DS x 3 days only (on low dose Coumadin for port - non therapeutic).

## 2013-07-30 ENCOUNTER — Ambulatory Visit (HOSPITAL_BASED_OUTPATIENT_CLINIC_OR_DEPARTMENT_OTHER): Payer: Medicare Other | Admitting: Hematology & Oncology

## 2013-07-30 ENCOUNTER — Telehealth: Payer: Self-pay | Admitting: Hematology & Oncology

## 2013-07-30 ENCOUNTER — Other Ambulatory Visit: Payer: Self-pay | Admitting: Hematology & Oncology

## 2013-07-30 ENCOUNTER — Other Ambulatory Visit (HOSPITAL_BASED_OUTPATIENT_CLINIC_OR_DEPARTMENT_OTHER): Payer: Medicare Other | Admitting: Lab

## 2013-07-30 ENCOUNTER — Ambulatory Visit (HOSPITAL_BASED_OUTPATIENT_CLINIC_OR_DEPARTMENT_OTHER)
Admission: RE | Admit: 2013-07-30 | Discharge: 2013-07-30 | Disposition: A | Discharge status: Home / Self Care | Payer: Medicare Other | Source: Ambulatory Visit | Attending: Hematology & Oncology | Admitting: Hematology & Oncology

## 2013-07-30 ENCOUNTER — Encounter (HOSPITAL_COMMUNITY)
Admission: RE | Admit: 2013-07-30 | Discharge: 2013-07-30 | Disposition: A | Discharge status: Home / Self Care | Payer: Medicare Other | Source: Ambulatory Visit | Attending: Hematology & Oncology | Admitting: Hematology & Oncology

## 2013-07-30 VITALS — BP 101/48 | HR 75 | Temp 98.0°F | Resp 14 | Ht <= 58 in | Wt 116.0 lb

## 2013-07-30 DIAGNOSIS — D4620 Refractory anemia with excess of blasts, unspecified: Secondary | ICD-10-CM

## 2013-07-30 DIAGNOSIS — C9000 Multiple myeloma not having achieved remission: Secondary | ICD-10-CM

## 2013-07-30 DIAGNOSIS — M25559 Pain in unspecified hip: Secondary | ICD-10-CM | POA: Insufficient documentation

## 2013-07-30 DIAGNOSIS — E039 Hypothyroidism, unspecified: Secondary | ICD-10-CM

## 2013-07-30 DIAGNOSIS — M545 Low back pain, unspecified: Secondary | ICD-10-CM | POA: Insufficient documentation

## 2013-07-30 DIAGNOSIS — N289 Disorder of kidney and ureter, unspecified: Secondary | ICD-10-CM

## 2013-07-30 DIAGNOSIS — G4700 Insomnia, unspecified: Secondary | ICD-10-CM

## 2013-07-30 DIAGNOSIS — E559 Vitamin D deficiency, unspecified: Secondary | ICD-10-CM

## 2013-07-30 DIAGNOSIS — M5380 Other specified dorsopathies, site unspecified: Secondary | ICD-10-CM | POA: Insufficient documentation

## 2013-07-30 DIAGNOSIS — D801 Nonfamilial hypogammaglobulinemia: Secondary | ICD-10-CM

## 2013-07-30 DIAGNOSIS — Z87898 Personal history of other specified conditions: Secondary | ICD-10-CM | POA: Insufficient documentation

## 2013-07-30 LAB — CBC WITH DIFFERENTIAL (CANCER CENTER ONLY)
BASO%: 0.9 % (ref 0.0–2.0)
HCT: 21.6 % — ABNORMAL LOW (ref 34.8–46.6)
LYMPH#: 0.6 10*3/uL — ABNORMAL LOW (ref 0.9–3.3)
LYMPH%: 16.3 % (ref 14.0–48.0)
MCHC: 31.5 g/dL — ABNORMAL LOW (ref 32.0–36.0)
MCV: 111 fL — ABNORMAL HIGH (ref 81–101)
MONO#: 0.5 10*3/uL (ref 0.1–0.9)
NEUT%: 63.8 % (ref 39.6–80.0)
RDW: 18.8 % — ABNORMAL HIGH (ref 11.1–15.7)
WBC: 3.4 10*3/uL — ABNORMAL LOW (ref 3.9–10.0)

## 2013-07-30 LAB — TECHNOLOGIST REVIEW CHCC SATELLITE

## 2013-07-30 LAB — CMP (CANCER CENTER ONLY)
Albumin: 3.2 g/dL — ABNORMAL LOW (ref 3.3–5.5)
Alkaline Phosphatase: 112 U/L — ABNORMAL HIGH (ref 26–84)
BUN, Bld: 27 mg/dL — ABNORMAL HIGH (ref 7–22)
CO2: 26 mEq/L (ref 18–33)
Glucose, Bld: 108 mg/dL (ref 73–118)
Potassium: 4.4 mEq/L (ref 3.3–4.7)
Total Bilirubin: 1.7 mg/dl — ABNORMAL HIGH (ref 0.20–1.60)

## 2013-07-30 MED ORDER — TRAMADOL HCL 50 MG PO TABS: 50.0000 mg | ORAL_TABLET | Freq: Four times a day (QID) | ORAL | Status: DC | PRN

## 2013-07-30 NOTE — Telephone Encounter (Signed)
Patient called and spoke Amy.  Per DM to sch patient to come in today for lab/Md apt.  i called patient back and patient will be coming at 2:45 today

## 2013-07-30 NOTE — Telephone Encounter (Signed)
Received a call from the pt requesting a refill. Last filled on 06/25/13 under French Ana Scott's name. Called in to [facility] as this is a chronic med for the pt.

## 2013-07-30 NOTE — Progress Notes (Signed)
This office note has been dictated.

## 2013-07-31 ENCOUNTER — Ambulatory Visit (HOSPITAL_BASED_OUTPATIENT_CLINIC_OR_DEPARTMENT_OTHER): Payer: Medicare Other

## 2013-07-31 VITALS — BP 142/62 | HR 89 | Temp 97.7°F | Resp 20

## 2013-07-31 DIAGNOSIS — D4620 Refractory anemia with excess of blasts, unspecified: Secondary | ICD-10-CM

## 2013-07-31 DIAGNOSIS — D46Z Other myelodysplastic syndromes: Secondary | ICD-10-CM

## 2013-07-31 MED ORDER — DIPHENHYDRAMINE HCL 50 MG/ML IJ SOLN
INTRAMUSCULAR | Status: AC
  Filled 2013-07-31: qty 1

## 2013-07-31 MED ORDER — DIPHENHYDRAMINE HCL 25 MG PO CAPS
25.0000 mg | ORAL_CAPSULE | Freq: Once | ORAL | Status: AC
  Administered 2013-07-31: 25 mg via ORAL

## 2013-07-31 MED ORDER — ACETAMINOPHEN 325 MG PO TABS
650.0000 mg | ORAL_TABLET | Freq: Once | ORAL | Status: AC
  Administered 2013-07-31: 650 mg via ORAL

## 2013-07-31 MED ORDER — DEXAMETHASONE SODIUM PHOSPHATE 20 MG/5ML IJ SOLN
INTRAMUSCULAR | Status: AC
  Filled 2013-07-31: qty 5

## 2013-07-31 MED ORDER — FAMOTIDINE IN NACL 20-0.9 MG/50ML-% IV SOLN
40.0000 mg | Freq: Once | INTRAVENOUS | Status: AC
  Administered 2013-07-31: 40 mg via INTRAVENOUS

## 2013-07-31 MED ORDER — DIPHENHYDRAMINE HCL 50 MG/ML IJ SOLN
25.0000 mg | Freq: Once | INTRAMUSCULAR | Status: AC
  Administered 2013-07-31: 25 mg via INTRAVENOUS

## 2013-07-31 MED ORDER — ACETAMINOPHEN 325 MG PO TABS
ORAL_TABLET | ORAL | Status: AC
  Filled 2013-07-31: qty 2

## 2013-07-31 MED ORDER — HYDROMORPHONE HCL PF 2 MG/ML IJ SOLN
INTRAMUSCULAR | Status: AC
  Filled 2013-07-31: qty 1

## 2013-07-31 MED ORDER — SODIUM CHLORIDE 0.9 % IV SOLN
250.0000 mL | Freq: Once | INTRAVENOUS | Status: AC
  Administered 2013-07-31: 250 mL via INTRAVENOUS

## 2013-07-31 MED ORDER — FAMOTIDINE IN NACL 20-0.9 MG/50ML-% IV SOLN
INTRAVENOUS | Status: AC
  Filled 2013-07-31: qty 100

## 2013-07-31 MED ORDER — DIPHENHYDRAMINE HCL 25 MG PO CAPS
ORAL_CAPSULE | ORAL | Status: AC
  Filled 2013-07-31: qty 1

## 2013-07-31 MED ORDER — FUROSEMIDE 10 MG/ML IJ SOLN: 20.0000 mg | Freq: Once | INTRAMUSCULAR | Status: DC

## 2013-07-31 NOTE — Progress Notes (Signed)
DIAGNOSIS: 1. Refractory kappa light chain myeloma. 2. 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  CURRENT THERAPY: 1. Intravenous immunoglobulin 50g IV q. month. 2. Aranesp 300 mcg subcu q.2 weeks for hemoglobin less than 10. 3. Zometa 3.3 mg IV monthly.  INTERIM HISTORY:  [name] comes in for a followup.  She has been complaining of pain in the right hip and lower back.  This has been going on for the past several days.  This seems to be worse when she tries to walk.  There is no pain radiating down her leg.  It is mostly just in the right hip area.  She has lost a couple of pounds.  She does not feel too well. She feels very fatigued.  She has had no bleeding.  She has had no problems with bowels or bladder.  She continues on quite a few medications.  She has not had a fever.  She has had no cough.  Her appetite is down a little bit.  She does have, in my mind, the biggest problem that being her therapy related myelodysplasia.  We did a bone marrow on her back in August.  This showed the persistent myeloma but yet there were changes that were highly consistent with myelodysplastic issues.  She does have abnormal chromosomes.  She has 3 chromosome abnormalities. 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  We did an erythropoietin level on her when we last saw her.  Her erythropoietin level is 110.  I clearly want to get her ESA therapy a little bit more aggressive given that she now has this myelodysplasia.  She has had no rashes.  Overall, performance status is about ECOG 2.  PHYSICAL EXAMINATION:  General:  This is a petite white female in no obvious distress.  Vital signs:  Temperature of 98, pulse 75, respiratory 18, blood pressure 101/48, weight is 116 pounds.  Head and neck:  Normocephalic, atraumatic skull.  There are no ocular or oral lesions.  There are no palpable cervical or  supraclavicular lymph nodes. Lungs:  Clear bilaterally.  Cardiac:  Regular rate and rhythm with a normal S1, S2.  She has a 1/6 systolic ejection murmur.  Abdomen:  Soft. She has good bowel sounds.  There is no fluid wave.  There is no palpable abdominal mass.  No palpable hepatosplenomegaly.  Extremities: Show some chronic changes in her lower legs.  Some slight nonpitting edema is noted in the lower legs.  She has decreased range motion of her thighs, which is chronic.  Skin:  Shows some slightly dry skin.  LABORATORY STUDIES:  White cell count is 3.4, hemoglobin 6.8, hematocrit 21.6, platelet count 36,000.  Her sodium is 135, potassium 4.4, BUN 27, creatinine 1.9.  Calcium 9.9 with an albumin of 3.2.  IMPRESSION:  [name] is a 67 year old white female.  She has refractory kappa light chain myeloma.  She just is not a candidate for systemic chemotherapy for this at this point because of her therapy related myelodysplasia.  We are just trying to support her and try to give her a good quality of life right now.  We will have to transfuse her.  I think this will make her feel better.  We did do her x-rays.  There is nothing in the right hip that looks new. There are no new fractures.  There are no lytic lesions.  She has no evidence of "hardware failure."  Her lumbar spine  showed degenerative changes.  No lytic lesions or fractures are noted.  I did give her a prescription for Ultram to take.  I reviewed all of her lab work with her.  I went over our recommendations.  We will again give her Aranesp every 2 weeks for right now.  I think if we can just get her hemoglobin above 10, this will make her feel better.  I will plan to see her back myself probably in about 4-6 weeks.  She should be coming up soon for her IVIG.  I spent about 40 minutes with her today.    ______________________________ Josph Macho, M.D. PRE/MEDQ  D:  07/30/2013  T:  07/31/2013  Job:  [id number]

## 2013-07-31 NOTE — Patient Instructions (Signed)
Blood Transfusion Information WHAT IS A BLOOD TRANSFUSION? A transfusion is the replacement of blood or some of its parts. Blood is made up of multiple cells which provide different functions.  Red blood cells carry oxygen and are used for blood loss replacement.  White blood cells fight against infection.  Platelets control bleeding.  Plasma helps clot blood.  Other blood products are available for specialized needs, such as hemophilia or other clotting disorders. BEFORE THE TRANSFUSION  Who gives blood for transfusions?   You may be able to donate blood to be used at a later date on yourself (autologous donation).  Relatives can be asked to donate blood. This is generally not any safer than if you have received blood from a stranger. The same precautions are taken to ensure safety when a relative's blood is donated.  Healthy volunteers who are fully evaluated to make sure their blood is safe. This is blood bank blood. Transfusion therapy is the safest it has ever been in the practice of medicine. Before blood is taken from a donor, a complete history is taken to make sure that person has no history of diseases nor engages in risky social behavior (examples are intravenous drug use or sexual activity with multiple partners). The donor's travel history is screened to minimize risk of transmitting infections, such as malaria. The donated blood is tested for signs of infectious diseases, such as HIV and hepatitis. The blood is then tested to be sure it is compatible with you in order to minimize the chance of a transfusion reaction. If you or a relative donates blood, this is often done in anticipation of surgery and is not appropriate for emergency situations. It takes many days to process the donated blood. RISKS AND COMPLICATIONS Although transfusion therapy is very safe and saves many lives, the main dangers of transfusion include:   Getting an infectious disease.  Developing a  transfusion reaction. This is an allergic reaction to something in the blood you were given. Every precaution is taken to prevent this. The decision to have a blood transfusion has been considered carefully by your caregiver before blood is given. Blood is not given unless the benefits outweigh the risks. AFTER THE TRANSFUSION  Right after receiving a blood transfusion, you will usually feel much better and more energetic. This is especially true if your red blood cells have gotten low (anemic). The transfusion raises the level of the red blood cells which carry oxygen, and this usually causes an energy increase.  The nurse administering the transfusion will monitor you carefully for complications. HOME CARE INSTRUCTIONS  No special instructions are needed after a transfusion. You may find your energy is better. Speak with your caregiver about any limitations on activity for underlying diseases you may have. SEEK MEDICAL CARE IF:   Your condition is not improving after your transfusion.  You develop redness or irritation at the intravenous (IV) site. SEEK IMMEDIATE MEDICAL CARE IF:  Any of the following symptoms occur over the next 12 hours:  Shaking chills.  You have a temperature by mouth above 102 F (38.9 C), not controlled by medicine.  Chest, back, or muscle pain.  People around you feel you are not acting correctly or are confused.  Shortness of breath or difficulty breathing.  Dizziness and fainting.  You get a rash or develop hives.  You have a decrease in urine output.  Your urine turns a dark color or changes to pink, red, or brown. Any of the following   symptoms occur over the next 10 days:  You have a temperature by mouth above 102 F (38.9 C), not controlled by medicine.  Shortness of breath.  Weakness after normal activity.  The white part of the eye turns yellow (jaundice).  You have a decrease in the amount of urine or are urinating less often.  Your  urine turns a dark color or changes to pink, red, or brown. Document Released: 10/01/2000 Document Revised: 12/27/2011 Document Reviewed: 05/20/2008 ExitCare Patient Information 2014 [employer].  

## 2013-08-01 LAB — TYPE AND SCREEN
ABO/RH(D): A NEG
Antibody Screen: NEGATIVE
Unit division: 0

## 2013-08-06 ENCOUNTER — Encounter: Payer: Self-pay | Admitting: Hematology & Oncology

## 2013-08-13 ENCOUNTER — Other Ambulatory Visit (HOSPITAL_BASED_OUTPATIENT_CLINIC_OR_DEPARTMENT_OTHER): Payer: Medicare Other | Admitting: Lab

## 2013-08-13 ENCOUNTER — Ambulatory Visit (HOSPITAL_BASED_OUTPATIENT_CLINIC_OR_DEPARTMENT_OTHER): Payer: Medicare Other | Admitting: Hematology & Oncology

## 2013-08-13 ENCOUNTER — Telehealth: Payer: Self-pay | Admitting: Hematology & Oncology

## 2013-08-13 ENCOUNTER — Ambulatory Visit (HOSPITAL_BASED_OUTPATIENT_CLINIC_OR_DEPARTMENT_OTHER): Payer: Medicare Other

## 2013-08-13 VITALS — BP 107/44 | HR 56 | Temp 97.4°F | Resp 14 | Ht 59.0 in | Wt 112.0 lb

## 2013-08-13 VITALS — BP 104/58 | HR 73 | Temp 98.1°F | Resp 18

## 2013-08-13 DIAGNOSIS — D631 Anemia in chronic kidney disease: Secondary | ICD-10-CM

## 2013-08-13 DIAGNOSIS — C9000 Multiple myeloma not having achieved remission: Secondary | ICD-10-CM

## 2013-08-13 DIAGNOSIS — D4620 Refractory anemia with excess of blasts, unspecified: Secondary | ICD-10-CM

## 2013-08-13 DIAGNOSIS — D649 Anemia, unspecified: Secondary | ICD-10-CM

## 2013-08-13 DIAGNOSIS — N289 Disorder of kidney and ureter, unspecified: Secondary | ICD-10-CM

## 2013-08-13 DIAGNOSIS — D801 Nonfamilial hypogammaglobulinemia: Secondary | ICD-10-CM

## 2013-08-13 DIAGNOSIS — D696 Thrombocytopenia, unspecified: Secondary | ICD-10-CM

## 2013-08-13 LAB — CMP (CANCER CENTER ONLY)
ALT(SGPT): 70 U/L — ABNORMAL HIGH (ref 10–47)
AST: 43 U/L — ABNORMAL HIGH (ref 11–38)
Albumin: 3.7 g/dL (ref 3.3–5.5)
Alkaline Phosphatase: 115 U/L — ABNORMAL HIGH (ref 26–84)
BUN, Bld: 23 mg/dL — ABNORMAL HIGH (ref 7–22)
Chloride: 105 mEq/L (ref 98–108)
Potassium: 4.6 mEq/L (ref 3.3–4.7)
Sodium: 143 mEq/L (ref 128–145)
Total Bilirubin: 1.5 mg/dl (ref 0.20–1.60)
Total Protein: 6.3 g/dL — ABNORMAL LOW (ref 6.4–8.1)

## 2013-08-13 LAB — CBC WITH DIFFERENTIAL (CANCER CENTER ONLY)
BASO#: 0.1 10*3/uL (ref 0.0–0.2)
BASO%: 1.3 % (ref 0.0–2.0)
HCT: 32.7 % — ABNORMAL LOW (ref 34.8–46.6)
HGB: 10.3 g/dL — ABNORMAL LOW (ref 11.6–15.9)
LYMPH#: 1.6 10*3/uL (ref 0.9–3.3)
LYMPH%: 21.2 % (ref 14.0–48.0)
MCHC: 31.5 g/dL — ABNORMAL LOW (ref 32.0–36.0)
MCV: 105 fL — ABNORMAL HIGH (ref 81–101)
MONO#: 1.3 10*3/uL — ABNORMAL HIGH (ref 0.1–0.9)
MONO%: 17.4 % — ABNORMAL HIGH (ref 0.0–13.0)
NEUT#: 4.3 10*3/uL (ref 1.5–6.5)
NEUT%: 56.3 % (ref 39.6–80.0)
RDW: 19.9 % — ABNORMAL HIGH (ref 11.1–15.7)

## 2013-08-13 MED ORDER — PROMETHAZINE HCL 25 MG PO TABS
12.5000 mg | ORAL_TABLET | Freq: Once | ORAL | Status: AC
  Administered 2013-08-13: 12.5 mg via ORAL

## 2013-08-13 MED ORDER — DARBEPOETIN ALFA-POLYSORBATE 300 MCG/0.6ML IJ SOLN
INTRAMUSCULAR | Status: AC
  Filled 2013-08-13: qty 0.6

## 2013-08-13 MED ORDER — DARBEPOETIN ALFA-POLYSORBATE 300 MCG/0.6ML IJ SOLN: 300.0000 ug | Freq: Once | INTRAMUSCULAR | Status: DC

## 2013-08-13 MED ORDER — ACETAMINOPHEN 325 MG PO TABS
ORAL_TABLET | ORAL | Status: AC
  Filled 2013-08-13: qty 2

## 2013-08-13 MED ORDER — ACETAMINOPHEN 325 MG PO TABS
650.0000 mg | ORAL_TABLET | Freq: Once | ORAL | Status: AC
  Administered 2013-08-13: 650 mg via ORAL

## 2013-08-13 MED ORDER — ZOLEDRONIC ACID 4 MG/5ML IV CONC
3.3000 mg | Freq: Once | INTRAVENOUS | Status: AC
  Administered 2013-08-13: 3.3 mg via INTRAVENOUS
  Filled 2013-08-13: qty 4.13

## 2013-08-13 MED ORDER — DEXAMETHASONE SODIUM PHOSPHATE 20 MG/5ML IJ SOLN
INTRAMUSCULAR | Status: AC
  Filled 2013-08-13: qty 5

## 2013-08-13 MED ORDER — PROMETHAZINE HCL 25 MG PO TABS
ORAL_TABLET | ORAL | Status: AC
  Filled 2013-08-13: qty 1

## 2013-08-13 MED ORDER — DARBEPOETIN ALFA-POLYSORBATE 300 MCG/0.6ML IJ SOLN
300.0000 ug | Freq: Once | INTRAMUSCULAR | Status: AC
  Administered 2013-08-13: 300 ug via SUBCUTANEOUS

## 2013-08-13 MED ORDER — IMMUNE GLOBULIN (HUMAN) 20 GM/200ML IV SOLN
0.7000 g/kg | Freq: Once | INTRAVENOUS | Status: AC
  Administered 2013-08-13: 40 g via INTRAVENOUS
  Filled 2013-08-13: qty 400

## 2013-08-13 MED ORDER — DIPHENHYDRAMINE HCL 25 MG PO CAPS
ORAL_CAPSULE | ORAL | Status: AC
  Filled 2013-08-13: qty 1

## 2013-08-13 MED ORDER — DIPHENHYDRAMINE HCL 25 MG PO CAPS
25.0000 mg | ORAL_CAPSULE | Freq: Once | ORAL | Status: AC
  Administered 2013-08-13: 25 mg via ORAL

## 2013-08-13 MED ORDER — DEXAMETHASONE SODIUM PHOSPHATE 20 MG/5ML IJ SOLN
40.0000 mg | Freq: Once | INTRAMUSCULAR | Status: AC
  Administered 2013-08-13: 40 mg via INTRAVENOUS

## 2013-08-13 NOTE — Patient Instructions (Signed)

## 2013-08-13 NOTE — Progress Notes (Signed)
This office note has been dictated.  This office note has been dictated.

## 2013-08-13 NOTE — Telephone Encounter (Signed)
Pt aware of 11-10 and 12-1 appointments. She is aware I mailed nov and dec schedule

## 2013-08-14 NOTE — Progress Notes (Signed)
DIAGNOSES: 1. Refractory kappa light chain myeloma. 2. 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  CURRENT THERAPY: 1. IVIG 50 g IV q. month. 2. Aranesp 300 mcg subcu q.2 weeks for hemoglobin less than 10. 3. Zometa 3.3 mg IV q. month.  INTERIM HISTORY:  [name] comes in for followup.  She actually looks pretty good.  I think she is responding to the Aranesp.  Her hemoglobin has come up quite nicely.  She says that she does not have much of an appetite.  We did put her on Marinol.  She is only taking it once a day.  I told her to try taking it 3 times a day.  She does have a bit of nausea.  I think if she took the Marinol 3 times a day, this might help her out a little bit.  She has not had any kind of lung issues.  She does have some underlying lung disease.  She does have the therapy-related myelodysplasia.  She has chronic thrombocytopenia.  Thankfully, this has not led to any type of bleeding.  She has had no problems with fever.  She has had no headache.  PHYSICAL EXAMINATION:  General:  This is a petite white female in no obvious distress.  Vital signs:  Temperature of 97.4, pulse is 56, respiratory rate 18, blood pressure 107/44.  Weight is 112 pounds.  Head and neck:  Normocephalic, atraumatic skull.  She has no scleral icterus. There are no intraoral lesions.  There is no adenopathy in her neck. Lungs:  Clear breath sounds bilaterally.  No rales, wheezes, or rhonchi are noted.  Cardiac:  Regular rate and rhythm with a normal S1 and S2. There are no murmurs, rubs, or bruits.  Abdomen:  Soft.  She has good bowel sounds.  There is no fluid wave.  There is no palpable hepatosplenomegaly.  Back:  No tenderness over the spine.  Extremities: Some chronic changes in her right thigh.  She has no edema in her lower legs.  She has decent strength in her legs.  She has some stiff joints, mostly in her hips and knees.  Skin:  No rashes, ecchymosis,  or petechia.  LABORATORY STUDIES:  White cell count 7.6, hemoglobin 10.3, hematocrit 32.7, platelet count 31,000.  BUN 23, creatinine 1.5.  Calcium 9.9 with an albumin of 3.7.  IMPRESSION:  [name] is a 67 year old white female with refractory kappa light chain myeloma.  The real problem for her right now is the fact that she has this therapy-related myelodysplasia that we really cannot treat her with any chemotherapy.  We are trying to await the FDA approval of one of the new monoclonal antibodies that we might be able to use.  Back in September her kappa light chain was 6.68 mg/dL.  This is on the higher side for her.  The fact that her hemoglobin is pretty good, I think is a sign that the myeloma is not too bad right now.  We will go ahead with the IVIG today.  I do want to go ahead and give her Aranesp today.  We will continue the Aranesp every 2-3 weeks.  I probably want to see her back in about a month or so.    ______________________________ Josph Macho, M.D. PRE/MEDQ  D:  08/13/2013  T:  08/14/2013  Job:  [id number]

## 2013-08-15 ENCOUNTER — Other Ambulatory Visit: Payer: Self-pay | Admitting: Internal Medicine

## 2013-08-15 ENCOUNTER — Other Ambulatory Visit: Payer: Self-pay | Admitting: Hematology & Oncology

## 2013-08-15 LAB — KAPPA/LAMBDA LIGHT CHAINS
Kappa free light chain: 7.21 mg/dL — ABNORMAL HIGH (ref 0.33–1.94)
Kappa:Lambda Ratio: 55.46 — ABNORMAL HIGH (ref 0.26–1.65)

## 2013-08-15 LAB — IGG, IGA, IGM
IgG (Immunoglobin G), Serum: 1150 mg/dL (ref 690–1700)
IgM, Serum: <5 mg/dL — ABNORMAL LOW (ref 52–322)

## 2013-08-15 NOTE — Telephone Encounter (Signed)
Rx request Denied; No longer under provider care, Patient has yet to make and/or keep appointment with PCP [Dr. Hodgin] office since last visit 11.06.13; was due back in [4] mth [03.2014]/SLS

## 2013-08-27 ENCOUNTER — Other Ambulatory Visit (HOSPITAL_BASED_OUTPATIENT_CLINIC_OR_DEPARTMENT_OTHER): Payer: Medicare Other | Admitting: Lab

## 2013-08-27 ENCOUNTER — Ambulatory Visit (HOSPITAL_COMMUNITY)
Admission: RE | Admit: 2013-08-27 | Discharge: 2013-08-27 | Disposition: A | Discharge status: Home / Self Care | Payer: Medicare Other | Source: Ambulatory Visit | Attending: Hematology & Oncology | Admitting: Hematology & Oncology

## 2013-08-27 ENCOUNTER — Ambulatory Visit (HOSPITAL_BASED_OUTPATIENT_CLINIC_OR_DEPARTMENT_OTHER): Payer: Medicare Other

## 2013-08-27 ENCOUNTER — Other Ambulatory Visit: Payer: Self-pay | Admitting: *Deleted

## 2013-08-27 VITALS — BP 95/62 | HR 86 | Temp 97.0°F | Resp 16

## 2013-08-27 DIAGNOSIS — F064 Anxiety disorder due to known physiological condition: Secondary | ICD-10-CM

## 2013-08-27 DIAGNOSIS — D649 Anemia, unspecified: Secondary | ICD-10-CM

## 2013-08-27 DIAGNOSIS — D469 Myelodysplastic syndrome, unspecified: Secondary | ICD-10-CM

## 2013-08-27 DIAGNOSIS — D62 Acute posthemorrhagic anemia: Secondary | ICD-10-CM

## 2013-08-27 DIAGNOSIS — D631 Anemia in chronic kidney disease: Secondary | ICD-10-CM

## 2013-08-27 DIAGNOSIS — C9000 Multiple myeloma not having achieved remission: Secondary | ICD-10-CM

## 2013-08-27 DIAGNOSIS — N289 Disorder of kidney and ureter, unspecified: Secondary | ICD-10-CM

## 2013-08-27 LAB — CBC WITH DIFFERENTIAL (CANCER CENTER ONLY)
BASO#: 0.1 10*3/uL (ref 0.0–0.2)
EOS%: 3.4 % (ref 0.0–7.0)
Eosinophils Absolute: 0.2 10*3/uL (ref 0.0–0.5)
HCT: 26.2 % — ABNORMAL LOW (ref 34.8–46.6)
HGB: 8.1 g/dL — ABNORMAL LOW (ref 11.6–15.9)
LYMPH#: 1.4 10*3/uL (ref 0.9–3.3)
LYMPH%: 23 % (ref 14.0–48.0)
MCV: 108 fL — ABNORMAL HIGH (ref 81–101)
MONO#: 1.2 10*3/uL — ABNORMAL HIGH (ref 0.1–0.9)
NEUT%: 53 % (ref 39.6–80.0)
RDW: 21.2 % — ABNORMAL HIGH (ref 11.1–15.7)
WBC: 6.2 10*3/uL (ref 3.9–10.0)

## 2013-08-27 LAB — PREPARE RBC (CROSSMATCH)

## 2013-08-27 MED ORDER — DULOXETINE HCL 60 MG PO CPEP: ORAL_CAPSULE | ORAL | Status: DC

## 2013-08-27 MED ORDER — DARBEPOETIN ALFA-POLYSORBATE 300 MCG/0.6ML IJ SOLN
300.0000 ug | Freq: Once | INTRAMUSCULAR | Status: AC
  Administered 2013-08-27: 300 ug via SUBCUTANEOUS

## 2013-08-27 MED ORDER — HYDROCODONE-ACETAMINOPHEN 5-325 MG PO TABS: ORAL_TABLET | ORAL | Status: DC

## 2013-08-27 MED ORDER — DARBEPOETIN ALFA-POLYSORBATE 300 MCG/0.6ML IJ SOLN
INTRAMUSCULAR | Status: AC
  Filled 2013-08-27: qty 0.6

## 2013-08-27 NOTE — Patient Instructions (Signed)
Darbepoetin Alfa injection What is this medicine? DARBEPOETIN ALFA (dar be POE e tin AL fa) helps your body make more red blood cells. It is used to treat anemia caused by chronic kidney failure and chemotherapy. This medicine may be used for other purposes; ask your health care provider or pharmacist if you have questions. COMMON BRAND NAME(S): Aranesp What should I tell my health care provider before I take this medicine? They need to know if you have any of these conditions: -blood clotting disorders or history of blood clots -cancer patient not on chemotherapy -cystic fibrosis -heart disease, such as angina, heart failure, or a history of a heart attack -hemoglobin level of 12 g/dL or greater -high blood pressure -low levels of folate, iron, or vitamin B12 -seizures -an unusual or allergic reaction to darbepoetin, erythropoietin, albumin, hamster proteins, latex, other medicines, foods, dyes, or preservatives -pregnant or trying to get pregnant -breast-feeding How should I use this medicine? This medicine is for injection into a vein or under the skin. It is usually given by a health care professional in a hospital or clinic setting. If you get this medicine at home, you will be taught how to prepare and give this medicine. Do not shake the solution before you withdraw a dose. Use exactly as directed. Take your medicine at regular intervals. Do not take your medicine more often than directed. It is important that you put your used needles and syringes in a special sharps container. Do not put them in a trash can. If you do not have a sharps container, call your pharmacist or healthcare provider to get one. Talk to your pediatrician regarding the use of this medicine in children. While this medicine may be used in children as young as 1 year for selected conditions, precautions do apply. Overdosage: If you think you have taken too much of this medicine contact a poison control center or  emergency room at once. NOTE: This medicine is only for you. Do not share this medicine with others. What if I miss a dose? If you miss a dose, take it as soon as you can. If it is almost time for your next dose, take only that dose. Do not take double or extra doses. What may interact with this medicine? Do not take this medicine with any of the following medications: -epoetin alfa This list may not describe all possible interactions. Give your health care provider a list of all the medicines, herbs, non-prescription drugs, or dietary supplements you use. Also tell them if you smoke, drink alcohol, or use illegal drugs. Some items may interact with your medicine. What should I watch for while using this medicine? Visit your prescriber or health care professional for regular checks on your progress and for the needed blood tests and blood pressure measurements. It is especially important for the doctor to make sure your hemoglobin level is in the desired range, to limit the risk of potential side effects and to give you the best benefit. Keep all appointments for any recommended tests. Check your blood pressure as directed. Ask your doctor what your blood pressure should be and when you should contact him or her. As your body makes more red blood cells, you may need to take iron, folic acid, or vitamin B supplements. Ask your doctor or health care provider which products are right for you. If you have kidney disease continue dietary restrictions, even though this medication can make you feel better. Talk with your doctor or health   care professional about the foods you eat and the vitamins that you take. What side effects may I notice from receiving this medicine? Side effects that you should report to your doctor or health care professional as soon as possible: -allergic reactions like skin rash, itching or hives, swelling of the face, lips, or tongue -breathing problems -changes in vision -chest  pain -confusion, trouble speaking or understanding -feeling faint or lightheaded, falls -high blood pressure -muscle aches or pains -pain, swelling, warmth in the leg -rapid weight gain -severe headaches -sudden numbness or weakness of the face, arm or leg -trouble walking, dizziness, loss of balance or coordination -seizures (convulsions) -swelling of the ankles, feet, hands -unusually weak or tired Side effects that usually do not require medical attention (report to your doctor or health care professional if they continue or are bothersome): -diarrhea -fever, chills (flu-like symptoms) -headaches -nausea, vomiting -redness, stinging, or swelling at site where injected This list may not describe all possible side effects. Call your doctor for medical advice about side effects. You may report side effects to FDA at 1-800-FDA-1088. Where should I keep my medicine? Keep out of the reach of children. Store in a refrigerator between 2 and 8 degrees C (36 and 46 degrees F). Do not freeze. Do not shake. Throw away any unused portion if using a single-dose vial. Throw away any unused medicine after the expiration date. NOTE: This sheet is a summary. It may not cover all possible information. If you have questions about this medicine, talk to your doctor, pharmacist, or health care provider.  2014, Elsevier/Gold Standard. (2008-09-17 10:23:57)  

## 2013-08-28 ENCOUNTER — Ambulatory Visit (HOSPITAL_BASED_OUTPATIENT_CLINIC_OR_DEPARTMENT_OTHER): Payer: Medicare Other

## 2013-08-28 VITALS — BP 115/60 | HR 77 | Temp 98.1°F | Resp 16

## 2013-08-28 DIAGNOSIS — D62 Acute posthemorrhagic anemia: Secondary | ICD-10-CM

## 2013-08-28 DIAGNOSIS — C9000 Multiple myeloma not having achieved remission: Secondary | ICD-10-CM

## 2013-08-28 DIAGNOSIS — D4620 Refractory anemia with excess of blasts, unspecified: Secondary | ICD-10-CM

## 2013-08-28 DIAGNOSIS — D469 Myelodysplastic syndrome, unspecified: Secondary | ICD-10-CM

## 2013-08-28 MED ORDER — DIPHENHYDRAMINE HCL 25 MG PO CAPS
25.0000 mg | ORAL_CAPSULE | Freq: Once | ORAL | Status: AC
  Administered 2013-08-28: 25 mg via ORAL

## 2013-08-28 MED ORDER — PROMETHAZINE HCL 25 MG PO TABS
ORAL_TABLET | ORAL | Status: AC
  Filled 2013-08-28: qty 1

## 2013-08-28 MED ORDER — ACETAMINOPHEN 325 MG PO TABS
650.0000 mg | ORAL_TABLET | Freq: Once | ORAL | Status: AC
  Administered 2013-08-28: 650 mg via ORAL

## 2013-08-28 MED ORDER — DIPHENHYDRAMINE HCL 25 MG PO CAPS
ORAL_CAPSULE | ORAL | Status: AC
  Filled 2013-08-28: qty 1

## 2013-08-28 MED ORDER — ACETAMINOPHEN 325 MG PO TABS
ORAL_TABLET | ORAL | Status: AC
  Filled 2013-08-28: qty 2

## 2013-08-28 MED ORDER — PROMETHAZINE HCL 25 MG PO TABS
12.5000 mg | ORAL_TABLET | Freq: Once | ORAL | Status: AC
  Administered 2013-08-28: 12.5 mg via ORAL

## 2013-08-28 MED ORDER — SODIUM CHLORIDE 0.9 % IV SOLN: 250.0000 mL | Freq: Once | INTRAVENOUS | Status: DC

## 2013-08-28 MED ORDER — FUROSEMIDE 10 MG/ML IJ SOLN: 20.0000 mg | Freq: Once | INTRAMUSCULAR | Status: DC

## 2013-08-28 NOTE — Patient Instructions (Signed)
Blood Transfusion  A blood transfusion replaces your blood or some of its parts. Blood is replaced when you have lost blood because of surgery, an accident, or for severe blood conditions like anemia. You can donate blood to be used on yourself if you have a planned surgery. If you lose blood during that surgery, your own blood can be given back to you. Any blood given to you is checked to make sure it matches your blood type. Your temperature, blood pressure, and heart rate (vital signs) will be checked often.  GET HELP RIGHT AWAY IF:   You feel sick to your stomach (nauseous) or throw up (vomit).  You have watery poop (diarrhea).  You have shortness of breath or trouble breathing.  You have blood in your pee (urine) or have dark colored pee.  You have chest pain or tightness.  Your eyes or skin turn yellow (jaundice).  You have a temperature by mouth above 102 F (38.9 C), not controlled by medicine.  You start to shake and have chills.  You develop a a red rash (hives) or feel itchy.  You develop lightheadedness or feel confused.  You develop back, joint, or muscle pain.  You do not feel hungry (lost appetite).  You feel tired, restless, or nervous.  You develop belly (abdominal) cramps. Document Released: 12/31/2008 Document Revised: 12/27/2011 Document Reviewed: 12/31/2008 ExitCare Patient Information 2014 [employer].  

## 2013-08-29 ENCOUNTER — Encounter: Payer: Self-pay | Admitting: Hematology & Oncology

## 2013-08-29 LAB — TYPE AND SCREEN
ABO/RH(D): A NEG
Antibody Screen: NEGATIVE
Unit division: 0
Unit division: 0

## 2013-08-31 ENCOUNTER — Other Ambulatory Visit: Payer: Self-pay | Admitting: *Deleted

## 2013-08-31 DIAGNOSIS — J329 Chronic sinusitis, unspecified: Secondary | ICD-10-CM

## 2013-08-31 MED ORDER — AZITHROMYCIN 250 MG PO TABS: ORAL_TABLET | ORAL | Status: DC

## 2013-08-31 NOTE — Telephone Encounter (Signed)
Pt called to report green drainage from sinuses.  Denies any chest congestion.  Denies fever.  Z-pak called to pt's pharmacy per Dr. Myna Hidalgo.

## 2013-09-05 ENCOUNTER — Telehealth: Payer: Self-pay | Admitting: *Deleted

## 2013-09-05 NOTE — Telephone Encounter (Signed)
Left message on voicemail stating that we are not aware of any recent approvals but do have her on the list when it is on the market. To call back if further questions.

## 2013-09-07 ENCOUNTER — Telehealth: Payer: Self-pay | Admitting: *Deleted

## 2013-09-07 NOTE — Telephone Encounter (Signed)
Returned call. No answer. Left another message stating Dr. Myna Hidalgo will find out more information on the approval in 2 weeks when they go to national conference. Further explained that once the med is FDA approved it will also need to be loaded in the computer database for the insurance companies. To call back if further questions.

## 2013-09-10 ENCOUNTER — Ambulatory Visit (HOSPITAL_BASED_OUTPATIENT_CLINIC_OR_DEPARTMENT_OTHER): Payer: Medicare Other

## 2013-09-10 ENCOUNTER — Telehealth: Payer: Self-pay | Admitting: *Deleted

## 2013-09-10 ENCOUNTER — Other Ambulatory Visit (HOSPITAL_BASED_OUTPATIENT_CLINIC_OR_DEPARTMENT_OTHER): Payer: Medicare Other | Admitting: Lab

## 2013-09-10 VITALS — BP 128/79 | HR 76 | Temp 98.2°F | Resp 16

## 2013-09-10 DIAGNOSIS — D631 Anemia in chronic kidney disease: Secondary | ICD-10-CM

## 2013-09-10 DIAGNOSIS — D469 Myelodysplastic syndrome, unspecified: Secondary | ICD-10-CM

## 2013-09-10 DIAGNOSIS — C9000 Multiple myeloma not having achieved remission: Secondary | ICD-10-CM

## 2013-09-10 DIAGNOSIS — N189 Chronic kidney disease, unspecified: Secondary | ICD-10-CM

## 2013-09-10 LAB — CBC WITH DIFFERENTIAL (CANCER CENTER ONLY)
BASO%: 0.6 % (ref 0.0–2.0)
Eosinophils Absolute: 0.2 10*3/uL (ref 0.0–0.5)
HCT: 31.6 % — ABNORMAL LOW (ref 34.8–46.6)
LYMPH#: 1.4 10*3/uL (ref 0.9–3.3)
LYMPH%: 20.5 % (ref 14.0–48.0)
MCV: 106 fL — ABNORMAL HIGH (ref 81–101)
MONO#: 1.2 10*3/uL — ABNORMAL HIGH (ref 0.1–0.9)
NEUT%: 58.6 % (ref 39.6–80.0)
RBC: 2.98 10*6/uL — ABNORMAL LOW (ref 3.70–5.32)
WBC: 6.2 10*3/uL (ref 3.9–10.0)

## 2013-09-10 LAB — HOLD TUBE, BLOOD BANK - CHCC SATELLITE

## 2013-09-10 MED ORDER — DARBEPOETIN ALFA-POLYSORBATE 300 MCG/0.6ML IJ SOLN
INTRAMUSCULAR | Status: AC
  Filled 2013-09-10: qty 0.6

## 2013-09-10 MED ORDER — DARBEPOETIN ALFA-POLYSORBATE 300 MCG/0.6ML IJ SOLN
300.0000 ug | Freq: Once | INTRAMUSCULAR | Status: AC
  Administered 2013-09-10: 300 ug via SUBCUTANEOUS

## 2013-09-10 NOTE — Telephone Encounter (Signed)
Pt called requesting to have a CBC drawn today as she is "feeling like a wet rag" and "dragging". Asked her to be here at 14:15. She verbalized understanding.

## 2013-09-10 NOTE — Patient Instructions (Signed)
Darbepoetin Alfa injection What is this medicine? DARBEPOETIN ALFA (dar be POE e tin AL fa) helps your body make more red blood cells. It is used to treat anemia caused by chronic kidney failure and chemotherapy. This medicine may be used for other purposes; ask your health care provider or pharmacist if you have questions. COMMON BRAND NAME(S): Aranesp What should I tell my health care provider before I take this medicine? They need to know if you have any of these conditions: -blood clotting disorders or history of blood clots -cancer patient not on chemotherapy -cystic fibrosis -heart disease, such as angina, heart failure, or a history of a heart attack -hemoglobin level of 12 g/dL or greater -high blood pressure -low levels of folate, iron, or vitamin B12 -seizures -an unusual or allergic reaction to darbepoetin, erythropoietin, albumin, hamster proteins, latex, other medicines, foods, dyes, or preservatives -pregnant or trying to get pregnant -breast-feeding How should I use this medicine? This medicine is for injection into a vein or under the skin. It is usually given by a health care professional in a hospital or clinic setting. If you get this medicine at home, you will be taught how to prepare and give this medicine. Do not shake the solution before you withdraw a dose. Use exactly as directed. Take your medicine at regular intervals. Do not take your medicine more often than directed. It is important that you put your used needles and syringes in a special sharps container. Do not put them in a trash can. If you do not have a sharps container, call your pharmacist or healthcare provider to get one. Talk to your pediatrician regarding the use of this medicine in children. While this medicine may be used in children as young as 1 year for selected conditions, precautions do apply. Overdosage: If you think you have taken too much of this medicine contact a poison control center or  emergency room at once. NOTE: This medicine is only for you. Do not share this medicine with others. What if I miss a dose? If you miss a dose, take it as soon as you can. If it is almost time for your next dose, take only that dose. Do not take double or extra doses. What may interact with this medicine? Do not take this medicine with any of the following medications: -epoetin alfa This list may not describe all possible interactions. Give your health care provider a list of all the medicines, herbs, non-prescription drugs, or dietary supplements you use. Also tell them if you smoke, drink alcohol, or use illegal drugs. Some items may interact with your medicine. What should I watch for while using this medicine? Visit your prescriber or health care professional for regular checks on your progress and for the needed blood tests and blood pressure measurements. It is especially important for the doctor to make sure your hemoglobin level is in the desired range, to limit the risk of potential side effects and to give you the best benefit. Keep all appointments for any recommended tests. Check your blood pressure as directed. Ask your doctor what your blood pressure should be and when you should contact him or her. As your body makes more red blood cells, you may need to take iron, folic acid, or vitamin B supplements. Ask your doctor or health care provider which products are right for you. If you have kidney disease continue dietary restrictions, even though this medication can make you feel better. Talk with your doctor or health   care professional about the foods you eat and the vitamins that you take. What side effects may I notice from receiving this medicine? Side effects that you should report to your doctor or health care professional as soon as possible: -allergic reactions like skin rash, itching or hives, swelling of the face, lips, or tongue -breathing problems -changes in vision -chest  pain -confusion, trouble speaking or understanding -feeling faint or lightheaded, falls -high blood pressure -muscle aches or pains -pain, swelling, warmth in the leg -rapid weight gain -severe headaches -sudden numbness or weakness of the face, arm or leg -trouble walking, dizziness, loss of balance or coordination -seizures (convulsions) -swelling of the ankles, feet, hands -unusually weak or tired Side effects that usually do not require medical attention (report to your doctor or health care professional if they continue or are bothersome): -diarrhea -fever, chills (flu-like symptoms) -headaches -nausea, vomiting -redness, stinging, or swelling at site where injected This list may not describe all possible side effects. Call your doctor for medical advice about side effects. You may report side effects to FDA at 1-800-FDA-1088. Where should I keep my medicine? Keep out of the reach of children. Store in a refrigerator between 2 and 8 degrees C (36 and 46 degrees F). Do not freeze. Do not shake. Throw away any unused portion if using a single-dose vial. Throw away any unused medicine after the expiration date. NOTE: This sheet is a summary. It may not cover all possible information. If you have questions about this medicine, talk to your doctor, pharmacist, or health care provider.  2014, Elsevier/Gold Standard. (2008-09-17 10:23:57)  

## 2013-09-11 ENCOUNTER — Ambulatory Visit: Payer: Medicare Other

## 2013-09-11 ENCOUNTER — Ambulatory Visit: Payer: Medicare Other | Admitting: Hematology & Oncology

## 2013-09-11 ENCOUNTER — Other Ambulatory Visit: Payer: Medicare Other | Admitting: Lab

## 2013-09-14 ENCOUNTER — Other Ambulatory Visit: Payer: Self-pay | Admitting: Hematology & Oncology

## 2013-09-17 ENCOUNTER — Ambulatory Visit (HOSPITAL_BASED_OUTPATIENT_CLINIC_OR_DEPARTMENT_OTHER): Payer: Medicare Other | Admitting: Hematology & Oncology

## 2013-09-17 ENCOUNTER — Other Ambulatory Visit (HOSPITAL_BASED_OUTPATIENT_CLINIC_OR_DEPARTMENT_OTHER): Payer: Medicare Other | Admitting: Lab

## 2013-09-17 ENCOUNTER — Ambulatory Visit (HOSPITAL_BASED_OUTPATIENT_CLINIC_OR_DEPARTMENT_OTHER): Payer: Medicare Other

## 2013-09-17 VITALS — BP 100/48 | HR 88 | Temp 98.2°F | Resp 14 | Ht 59.0 in | Wt 111.0 lb

## 2013-09-17 VITALS — BP 102/49 | HR 78 | Temp 97.3°F | Resp 18

## 2013-09-17 DIAGNOSIS — C9000 Multiple myeloma not having achieved remission: Secondary | ICD-10-CM

## 2013-09-17 DIAGNOSIS — D801 Nonfamilial hypogammaglobulinemia: Secondary | ICD-10-CM

## 2013-09-17 DIAGNOSIS — D4620 Refractory anemia with excess of blasts, unspecified: Secondary | ICD-10-CM

## 2013-09-17 DIAGNOSIS — D631 Anemia in chronic kidney disease: Secondary | ICD-10-CM

## 2013-09-17 LAB — CMP (CANCER CENTER ONLY)
AST: 53 U/L — ABNORMAL HIGH (ref 11–38)
Albumin: 3.3 g/dL (ref 3.3–5.5)
BUN, Bld: 23 mg/dL — ABNORMAL HIGH (ref 7–22)
Calcium: 9.9 mg/dL (ref 8.0–10.3)
Chloride: 101 mEq/L (ref 98–108)
Glucose, Bld: 103 mg/dL (ref 73–118)
Potassium: 4.2 mEq/L (ref 3.3–4.7)
Sodium: 140 mEq/L (ref 128–145)
Total Bilirubin: 3 mg/dl — ABNORMAL HIGH (ref 0.20–1.60)
Total Protein: 5.8 g/dL — ABNORMAL LOW (ref 6.4–8.1)

## 2013-09-17 LAB — CBC WITH DIFFERENTIAL (CANCER CENTER ONLY)
HGB: 9.3 g/dL — ABNORMAL LOW (ref 11.6–15.9)
MCH: 33.3 pg (ref 26.0–34.0)
MCHC: 30.8 g/dL — ABNORMAL LOW (ref 32.0–36.0)
MCV: 108 fL — ABNORMAL HIGH (ref 81–101)
Platelets: 25 10*3/uL — ABNORMAL LOW (ref 145–400)
RBC: 2.79 10*6/uL — ABNORMAL LOW (ref 3.70–5.32)
WBC: 6.9 10*3/uL (ref 3.9–10.0)

## 2013-09-17 LAB — MANUAL DIFFERENTIAL (CHCC SATELLITE)
ALC: 1.2 10*3/uL (ref 0.6–2.2)
BASO: 1 % (ref 0–2)
Blasts: 1 % — ABNORMAL HIGH (ref 0–0)
Eos: 4 % (ref 0–7)
LYMPH: 17 % (ref 14–48)
Metamyelocytes: 1 % — ABNORMAL HIGH (ref 0–0)
Myelocytes: 2 % — ABNORMAL HIGH (ref 0–0)
PLT EST ~~LOC~~: DECREASED
SEG: 54 % (ref 40–75)

## 2013-09-17 LAB — IRON AND TIBC CHCC
%SAT: UNDETERMINED % (ref 21–?)
TIBC: 118 ug/dL — ABNORMAL LOW (ref 236–444)

## 2013-09-17 MED ORDER — IMMUNE GLOBULIN (HUMAN) 20 GM/200ML IV SOLN
0.7000 g/kg | Freq: Once | INTRAVENOUS | Status: AC
  Administered 2013-09-17: 40 g via INTRAVENOUS
  Filled 2013-09-17: qty 400

## 2013-09-17 MED ORDER — DARBEPOETIN ALFA-POLYSORBATE 300 MCG/0.6ML IJ SOLN: 300.0000 ug | Freq: Once | INTRAMUSCULAR | Status: DC

## 2013-09-17 MED ORDER — ACETAMINOPHEN 325 MG PO TABS
ORAL_TABLET | ORAL | Status: AC
  Filled 2013-09-17: qty 2

## 2013-09-17 MED ORDER — DIPHENHYDRAMINE HCL 25 MG PO CAPS
ORAL_CAPSULE | ORAL | Status: AC
  Filled 2013-09-17: qty 1

## 2013-09-17 MED ORDER — ZOLEDRONIC ACID 4 MG/5ML IV CONC
3.3000 mg | Freq: Once | INTRAVENOUS | Status: AC
  Administered 2013-09-17: 3.3 mg via INTRAVENOUS
  Filled 2013-09-17: qty 4.13

## 2013-09-17 MED ORDER — SODIUM CHLORIDE 0.9 % IV SOLN
INTRAVENOUS | Status: DC
  Administered 2013-09-17: 13:00:00 via INTRAVENOUS

## 2013-09-17 MED ORDER — DIPHENHYDRAMINE HCL 25 MG PO CAPS
25.0000 mg | ORAL_CAPSULE | Freq: Once | ORAL | Status: AC
  Administered 2013-09-17: 25 mg via ORAL

## 2013-09-17 MED ORDER — ACETAMINOPHEN 325 MG PO TABS
650.0000 mg | ORAL_TABLET | Freq: Once | ORAL | Status: AC
  Administered 2013-09-17: 650 mg via ORAL

## 2013-09-17 NOTE — Progress Notes (Signed)
This office note has been dictated.

## 2013-09-17 NOTE — Patient Instructions (Signed)
iImmune Globulin Injection What is this medicine? IMMUNE GLOBULIN (im MUNE GLOB yoo lin) helps to prevent or reduce the severity of certain infections in patients who are at risk. This medicine is collected from the pooled blood of many donors. It is used to treat immune system problems, thrombocytopenia, and Kawasaki syndrome. This medicine may be used for other purposes; ask your health care provider or pharmacist if you have questions. What should I tell my health care provider before I take this medicine? They need to know if you have any of these conditions: - diabetes - extremely low or no immune antibodies in the blood - heart disease - history of blood clots - hyperprolinemia - infection in the blood, sepsis - kidney disease - taking medicine that may change kidney function - ask your health care provider about your medicine - an unusual or allergic reaction to human immune globulin, albumin, maltose, sucrose, polysorbate 80, other medicines, foods, dyes, or preservatives - pregnant or trying to get pregnant - breast-feeding How should I use this medicine? This medicine is for injection into a muscle or infusion into a vein or skin. It is usually given by a health care professional in a hospital or clinic setting. In rare cases, some brands of this medicine might be given at home. You will be taught how to give this medicine. Use exactly as directed. Take your medicine at regular intervals. Do not take your medicine more often than directed. Talk to your pediatrician regarding the use of this medicine in children. Special care may be needed. Overdosage: If you think you have taken too much of this medicine contact a poison control center or emergency room at once. NOTE: This medicine is only for you. Do not share this medicine with others. What if I miss a dose? It is important not to miss your dose. Call your doctor or health care professional if you are unable to keep an  appointment. If you give yourself the medicine and you miss a dose, take it as soon as you can. If it is almost time for your next dose, take only that dose. Do not take double or extra doses. What may interact with this medicine? -aspirin and aspirin-like medicines -cisplatin -cyclosporine -medicines for infection like acyclovir, adefovir, amphotericin B, bacitracin, cidofovir, foscarnet, ganciclovir, gentamicin, pentamidine, vancomycin -NSAIDS, medicines for pain and inflammation, like ibuprofen or naproxen -pamidronate -vaccines -zoledronic acid This list may not describe all possible interactions. Give your health care provider a list of all the medicines, herbs, non-prescription drugs, or dietary supplements you use. Also tell them if you smoke, drink alcohol, or use illegal drugs. Some items may interact with your medicine. What should I watch for while using this medicine? Your condition will be monitored carefully while you are receiving this medicine. This medicine is made from pooled blood donations of many different people. It may be possible to pass an infection in this medicine. However, the donors are screened for infections and all products are tested for HIV and hepatitis. The medicine is treated to kill most or all bacteria and viruses. Talk to your doctor about the risks and benefits of this medicine. Do not have vaccinations for at least 14 days before, or until at least 3 months after receiving this medicine. What side effects may I notice from receiving this medicine? Side effects that you should report to your doctor or health care professional as soon as possible: -allergic reactions like skin rash, itching or hives, swelling of  the face, lips, or tongue -breathing problems -chest pain or tightness -fever, chills -headache with nausea, vomiting -neck pain or difficulty moving neck -pain when moving eyes -pain, swelling, warmth in the leg -problems with balance,  talking, walking -sudden weight gain -swelling of the ankles, feet, hands -trouble passing urine or change in the amount of urine Side effects that usually do not require medical attention (report to your doctor or health care professional if they continue or are bothersome): -dizzy, drowsy -flushing -increased sweating -leg cramps -muscle aches and pains -pain at site where injected This list may not describe all possible side effects. Call your doctor for medical advice about side effects. You may report side effects to FDA at 1-800-FDA-1088. Where should I keep my medicine? Keep out of the reach of children. This drug is usually given in a hospital or clinic and will not be stored at home. In rare cases, some brands of this medicine may be given at home. If you are using this medicine at home, you will be instructed on how to store this medicine. Throw away any unused medicine after the expiration date on the label. NOTE: This sheet is a summary. It may not cover all possible information. If you have questions about this medicine, talk to your doctor, pharmacist, or health care provider.  2012, Elsevier/Gold Standard. (12/25/2008 11:44:49 AM) 

## 2013-09-18 ENCOUNTER — Other Ambulatory Visit: Payer: Self-pay | Admitting: Hematology & Oncology

## 2013-09-18 LAB — KAPPA/LAMBDA LIGHT CHAINS
Kappa:Lambda Ratio: 90 — ABNORMAL HIGH (ref 0.26–1.65)
Lambda Free Lght Chn: 0.12 mg/dL — ABNORMAL LOW (ref 0.57–2.63)

## 2013-09-21 ENCOUNTER — Ambulatory Visit (HOSPITAL_COMMUNITY)
Admission: RE | Admit: 2013-09-21 | Discharge: 2013-09-21 | Disposition: A | Discharge status: Home / Self Care | Payer: Medicare Other | Source: Ambulatory Visit | Attending: Hematology & Oncology | Admitting: Hematology & Oncology

## 2013-09-21 DIAGNOSIS — D649 Anemia, unspecified: Secondary | ICD-10-CM | POA: Insufficient documentation

## 2013-09-21 DIAGNOSIS — C9000 Multiple myeloma not having achieved remission: Secondary | ICD-10-CM | POA: Insufficient documentation

## 2013-09-22 NOTE — Progress Notes (Signed)
DIAGNOSIS: 1. Refractory kappa light chain myeloma. 2. 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  CURRENT THERAPY: 1. IVIG 50 g IV monthly. 2. Aranesp 300 mcg subcu q.2 weeks for hemoglobin less than 10. 3. Zometa 3.3 mg IV monthly.  INTERIM HISTORY:  [name] comes in for followup.  She is about the same.  She is tired all the time.  This, I think, is going to be a recurrent problem for her.  Of note, her last ferritin was 8500.  I think this really goes to show how dysfunctional her bone marrow is.  Her platelet count continues to drop.  She has had some bruising, but no bleeding.  She, in my mind, is beginning to show more of a problem with her myelodysplasia than with the myeloma.  Her kappa light chain levels have been going up slowly.  Her last kappa light chain back in October was 7.2 mg/dL.  I think that she just has incredible, ineffective erythropoiesis, given this ferritin level.  She had a decent Thanksgiving.  Her appetite seems to come and go.  She has had no nausea.  She is on lot of medications.  Importantly, she refuses to see any other doctor to try to manage her other health issues.  We tried her on some Marinol.  I am not sure if she is still taking this or not.  This is to try to help improve her appetite.  PHYSICAL EXAMINATION:  General:  This is a petite white female in no obvious distress.  Vital Signs:  Temperature of 98.2, pulse 88, respiratory rate 14, blood pressure 100/48.  Weight is 111 pounds.  Head and Neck:  Exam shows a normocephalic, atraumatic skull.  There are no ocular or oral lesions.  There are no palpable cervical or supraclavicular lymph nodes.  Lungs:  Clear bilaterally.  Cardiac: Regular rate and rhythm, with an occasional extra beat.  She has no murmurs, rubs or bruits.  Abdomen:  Soft.  She has good bowel sounds. There is no fluid wave.  There is no  palpable abdominal mass.  There is no palpable hepatosplenomegaly.  Back:  No tenderness over the spine. Extremities:  Show chronic changes from past radiation and calcifications with her thighs.  She has no edema in her legs.  Skin: No rash.  She has some scattered ecchymoses.  Neurological:  Shows no focal neurological deficits.  LABORATORY STUDIES:  White blood cell count is 6.9, hemoglobin 9.3, hematocrit 30.2, platelet count 25,000.  On her peripheral blood smear count, I do see some immature myeloid cells.  I thought I saw a rare blast.  She has some myelocytes and metamyelocytes.  She has a few nucleated red blood cells.  It is hard to tell if there are any circulating plasma cells.  Platelets are decreased in number.  Platelets are small.  She has no obvious rouleaux formation.  Her BUN is 23, creatinine 1.5.  Bilirubin is 3.  SGPT 79, SGOT 53.  IMPRESSION:  Ms. Reliford is a nice 67 year old white female.  She really has two big problems now.  To me, I think that the bigger problem is going to end up being this myelodysplasia.  Her blood smear definitely looks like we are seeing more immature myeloid cells now. Again, I think this is indicative of her myelodysplasia possibly beginning to transform.  She has numerous chromosomal abnormalities.  I think this also speaks to the severity of  her myelodysplasia, in that she would probably be considered high-grade myelodysplasia.  I just do not think that Ms. Hyun ever is going to be treated for the myeloma.  I think that we may actually need to consider treatment, if possible, for the myelodysplastic process.  We might need to consider azacitidine or decitabine.  I think we are going to have issues with Ms. Totten now.  Something just tells me that we might be in for a rough road ahead for her.  I will have to speak with her daughter about all this.  I think Ms. Halbert is coming every couple of weeks to have her  blood checked.  I think that we are going to be looking at a situation that is going to be very difficult to manage, given her other health issues.  Her performance status is okay, but not the best (ECOG 1-2).  We will see about getting her back to see [place] in another month.  We did go and give her the IVIG today.  I spent about 45 minutes with Ms. Ruthann Cancer today.    ______________________________ Josph Macho, M.D. PRE/MEDQ  D:  09/17/2013  T:  09/22/2013  Job:  [id number]

## 2013-09-24 ENCOUNTER — Telehealth: Payer: Self-pay | Admitting: Hematology & Oncology

## 2013-09-24 ENCOUNTER — Ambulatory Visit: Payer: Medicare Other

## 2013-09-24 ENCOUNTER — Other Ambulatory Visit: Payer: Self-pay | Admitting: *Deleted

## 2013-09-24 ENCOUNTER — Other Ambulatory Visit: Payer: Self-pay | Admitting: Hematology & Oncology

## 2013-09-24 ENCOUNTER — Other Ambulatory Visit: Payer: Medicare Other | Admitting: Lab

## 2013-09-24 DIAGNOSIS — C9000 Multiple myeloma not having achieved remission: Secondary | ICD-10-CM

## 2013-09-24 NOTE — Progress Notes (Signed)
Received a message from pt's dgtr [name]. Ms. Laubscher has officially moved in with them and they are no longer allowing her to drive. She is requesting help from the office in obtaining services for her at home and possible reimbursement for a wheelchair ramp. Moses Manners, SW at [facility] and placed official SW consult.

## 2013-09-24 NOTE — Progress Notes (Signed)
DIAGNOSES: 1. Refractory kappa light chain myeloma. 2. 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  CURRENT THERAPY: 1. IVIG 50 g IV monthly. 2. Zometa 3.3 mg IV monthly. 3. Aranesp 300 mcg subcu as needed for hemoglobin less than 10.  INTERIM HISTORY:  [name] comes in for followup.  We have her off therapy right now.  She actually is doing fairly well.  She feels okay. She is little bit tired.  She then had an increasing bone pain.  She has been treated basically for 20 years or so for the myeloma.  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  She had a mild myelodysplastic changes on her last bone marrow that we did.  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  Again, her quality of life is doing pretty well.  She went to the beach with her family over Labor Day weekend.  She had a nice time.  I was glad that she is able to enjoy it.  She has had no fever.  There has been no increased cough.  She does have underlying lung disease.  There has been no nausea or vomiting.  She has had no headache.  She has bone pain, mostly in her right thigh which has been chronic.  She is on lot of medications.  Mostly, she says she only takes if it is necessary.  She really have a hard time measuring her myeloma levels, as she does not have a lot of light chain in her serum.  Her last kappa light chain back in early August was 5 mg/dL.  Again, this been holding pretty steady.  PHYSICAL EXAMINATION:  General:  This is a petite white female in no obvious distress.  Vital signs:  Temperature of 98.2, pulse 87, respiratory rate 16, blood pressure 100/50.  Weight 118 pounds.  Head and neck:  Normocephalic, atraumatic skull.  There are no ocular or oral lesions.  There are no palpable cervical or supraclavicular lymph nodes. Lungs:  Clear bilaterally.  Cardiac:  Regular rate and rhythm with a normal S1  and S2.  There are no murmurs, rubs or bruits.  Abdomen: Soft.  She has good bowel sounds.  There is no fluid wave.  There is no palpable hepatosplenomegaly.  Extremities:  Some changes which are consistent with some muscular changes from her past radiation.  No swelling is noted in her legs.  Neurological:  No focal neurological deficits.  LABORATORY STUDIES:  White cell count is 3.8, hemoglobin 9.6, hematocrit 29.6, platelet count 47,000.  Sodium 140, potassium 3.8, BUN 19, creatinine 1.5.  IMPRESSION:  [name] is a very nice 67 year old white female with kappa light chain myeloma.  This is refractory.  We have really been through most therapies that I can think of.  She is not a candidate for any systemic chemotherapy trials.  The real problem that we have is that she has myelodysplasia.  This is really going to limit how aggressive we can be with her.  Despite no therapy for a month or so, platelet count still has not improved.  I think the best option that we will have for her is when the new monoclonal antibody comes out for her myeloma.  We will go ahead and give her the IVIG today.  I will give her the Zometa and the Aranesp.  We will have her plan to come back to see [place] in another 3  or 4 weeks.  She certainly can come back sooner if she has any issues.  I would surely consider her blood work in between visits.  I spent a good 40 minutes or so with [name] today.    ______________________________ Josph Macho, M.D. PRE/MEDQ  D:  07/06/2013  T:  09/24/2013  Job:  [id number]

## 2013-09-24 NOTE — Telephone Encounter (Signed)
Pt moved 12-8 to 12-9 due to transportation issues

## 2013-09-25 ENCOUNTER — Other Ambulatory Visit (HOSPITAL_BASED_OUTPATIENT_CLINIC_OR_DEPARTMENT_OTHER): Payer: Medicare Other | Admitting: Lab

## 2013-09-25 ENCOUNTER — Ambulatory Visit (HOSPITAL_BASED_OUTPATIENT_CLINIC_OR_DEPARTMENT_OTHER): Payer: Medicare Other

## 2013-09-25 ENCOUNTER — Encounter: Payer: Self-pay | Admitting: *Deleted

## 2013-09-25 DIAGNOSIS — D631 Anemia in chronic kidney disease: Secondary | ICD-10-CM

## 2013-09-25 DIAGNOSIS — N189 Chronic kidney disease, unspecified: Secondary | ICD-10-CM

## 2013-09-25 DIAGNOSIS — D4620 Refractory anemia with excess of blasts, unspecified: Secondary | ICD-10-CM

## 2013-09-25 DIAGNOSIS — C9000 Multiple myeloma not having achieved remission: Secondary | ICD-10-CM

## 2013-09-25 LAB — MANUAL DIFFERENTIAL (CHCC SATELLITE)
ANC (CHCC HP manual diff): 3.3 10*3/uL (ref 1.5–6.7)
BASO: 1 % (ref 0–2)
Band Neutrophils: 6 % (ref 0–10)
Blasts: 1 % — ABNORMAL HIGH (ref 0–0)
Eos: 4 % (ref 0–7)
LYMPH: 16 % (ref 14–48)
MONO: 15 % — ABNORMAL HIGH (ref 0–13)
Metamyelocytes: 1 % — ABNORMAL HIGH (ref 0–0)
Other Cells: 8 % — ABNORMAL HIGH (ref 0–0)

## 2013-09-25 LAB — CBC WITH DIFFERENTIAL (CANCER CENTER ONLY)
HCT: 26.8 % — ABNORMAL LOW (ref 34.8–46.6)
Platelets: 20 10*3/uL — ABNORMAL LOW (ref 145–400)
RBC: 2.41 10*6/uL — ABNORMAL LOW (ref 3.70–5.32)
WBC: 6 10*3/uL (ref 3.9–10.0)

## 2013-09-25 LAB — CMP (CANCER CENTER ONLY)
ALT(SGPT): 62 U/L — ABNORMAL HIGH (ref 10–47)
Alkaline Phosphatase: 155 U/L — ABNORMAL HIGH (ref 26–84)
CO2: 27 mEq/L (ref 18–33)
Calcium: 8.9 mg/dL (ref 8.0–10.3)
Chloride: 104 mEq/L (ref 98–108)
Creat: 1.5 mg/dl — ABNORMAL HIGH (ref 0.6–1.2)
Potassium: 4.2 mEq/L (ref 3.3–4.7)
Sodium: 137 mEq/L (ref 128–145)
Total Protein: 6.1 g/dL — ABNORMAL LOW (ref 6.4–8.1)

## 2013-09-25 MED ORDER — DARBEPOETIN ALFA-POLYSORBATE 300 MCG/0.6ML IJ SOLN
300.0000 ug | Freq: Once | INTRAMUSCULAR | Status: AC
  Administered 2013-09-25: 300 ug via SUBCUTANEOUS

## 2013-09-25 MED ORDER — DARBEPOETIN ALFA-POLYSORBATE 300 MCG/0.6ML IJ SOLN
INTRAMUSCULAR | Status: AC
  Filled 2013-09-25: qty 0.6

## 2013-09-25 NOTE — Progress Notes (Signed)
[facility] Clinical Social Work  Visual merchandiser received referral from [employer] fundraiser for home health needs.  CSW contacted the pt's daughter by phone to offer support and assess for needs.  Pt's daughter expressed concern for additional support in the home due to her mothers recent physical decline, and need for additional care.  CSW reviewed home health services and home care services.  Pt's daughter expressed interest in home health care, and was agreeable to referral for a home health assessment.  CSW shared information with Amy, RN who will complete the order/referral for home health care.  CSW encouraged pt's daughter to call with any additional questions or concerns.   Tamala Julian, MSW, LCSW Clinical Social Worker [facility] [phone]

## 2013-09-26 ENCOUNTER — Other Ambulatory Visit: Payer: Self-pay | Admitting: Hematology & Oncology

## 2013-09-26 ENCOUNTER — Other Ambulatory Visit: Payer: Self-pay | Admitting: *Deleted

## 2013-09-26 DIAGNOSIS — D649 Anemia, unspecified: Secondary | ICD-10-CM

## 2013-09-26 DIAGNOSIS — C9000 Multiple myeloma not having achieved remission: Secondary | ICD-10-CM

## 2013-09-28 ENCOUNTER — Other Ambulatory Visit (HOSPITAL_BASED_OUTPATIENT_CLINIC_OR_DEPARTMENT_OTHER): Payer: Medicare Other | Admitting: Lab

## 2013-09-28 ENCOUNTER — Other Ambulatory Visit: Payer: Self-pay | Admitting: Oncology

## 2013-09-28 ENCOUNTER — Ambulatory Visit (HOSPITAL_BASED_OUTPATIENT_CLINIC_OR_DEPARTMENT_OTHER): Payer: Medicare Other

## 2013-09-28 ENCOUNTER — Other Ambulatory Visit: Payer: Self-pay | Admitting: *Deleted

## 2013-09-28 VITALS — BP 116/51 | HR 77 | Temp 98.4°F | Resp 20

## 2013-09-28 DIAGNOSIS — C9000 Multiple myeloma not having achieved remission: Secondary | ICD-10-CM

## 2013-09-28 DIAGNOSIS — D649 Anemia, unspecified: Secondary | ICD-10-CM

## 2013-09-28 DIAGNOSIS — D4620 Refractory anemia with excess of blasts, unspecified: Secondary | ICD-10-CM

## 2013-09-28 LAB — CBC WITH DIFFERENTIAL (CANCER CENTER ONLY)
EOS%: 4.1 % (ref 0.0–7.0)
Eosinophils Absolute: 0.3 10*3/uL (ref 0.0–0.5)
HCT: 25.7 % — ABNORMAL LOW (ref 34.8–46.6)
LYMPH%: 27.4 % (ref 14.0–48.0)
MCH: 34.3 pg — ABNORMAL HIGH (ref 26.0–34.0)
MCV: 112 fL — ABNORMAL HIGH (ref 81–101)
MONO%: 24.8 % — ABNORMAL HIGH (ref 0.0–13.0)
NEUT%: 43 % (ref 39.6–80.0)
Platelets: 24 10*3/uL — ABNORMAL LOW (ref 145–400)
RBC: 2.3 10*6/uL — ABNORMAL LOW (ref 3.70–5.32)
RDW: 22.4 % — ABNORMAL HIGH (ref 11.1–15.7)

## 2013-09-28 LAB — PREPARE RBC (CROSSMATCH)

## 2013-09-28 LAB — TECHNOLOGIST REVIEW CHCC SATELLITE: Tech Review: 8

## 2013-09-28 MED ORDER — FUROSEMIDE 10 MG/ML IJ SOLN: 20.0000 mg | Freq: Once | INTRAMUSCULAR | Status: DC

## 2013-09-28 MED ORDER — FUROSEMIDE 10 MG/ML IJ SOLN
INTRAMUSCULAR | Status: AC
  Filled 2013-09-28: qty 4

## 2013-09-28 MED ORDER — DIPHENHYDRAMINE HCL 25 MG PO CAPS
ORAL_CAPSULE | ORAL | Status: AC
  Filled 2013-09-28: qty 1

## 2013-09-28 MED ORDER — OXYCODONE HCL 5 MG PO TABS: 5.0000 mg | ORAL_TABLET | ORAL | Status: DC | PRN

## 2013-09-28 MED ORDER — ACETAMINOPHEN 325 MG PO TABS
650.0000 mg | ORAL_TABLET | Freq: Once | ORAL | Status: AC
  Administered 2013-09-28: 650 mg via ORAL

## 2013-09-28 MED ORDER — ACETAMINOPHEN 325 MG PO TABS
ORAL_TABLET | ORAL | Status: AC
  Filled 2013-09-28: qty 2

## 2013-09-28 MED ORDER — LORAZEPAM 0.5 MG PO TABS
ORAL_TABLET | ORAL | Status: AC
  Filled 2013-09-28: qty 1

## 2013-09-28 MED ORDER — SODIUM CHLORIDE 0.9 % IV SOLN
250.0000 mL | Freq: Once | INTRAVENOUS | Status: AC
  Administered 2013-09-28: 250 mL via INTRAVENOUS

## 2013-09-28 MED ORDER — DIPHENHYDRAMINE HCL 25 MG PO CAPS
25.0000 mg | ORAL_CAPSULE | Freq: Once | ORAL | Status: AC
  Administered 2013-09-28: 25 mg via ORAL

## 2013-09-28 MED ORDER — CYCLOBENZAPRINE HCL 5 MG PO TABS: 5.0000 mg | ORAL_TABLET | Freq: Three times a day (TID) | ORAL | Status: AC | PRN

## 2013-09-28 NOTE — Patient Instructions (Signed)
Blood Transfusion  A blood transfusion replaces your blood or some of its parts. Blood is replaced when you have lost blood because of surgery, an accident, or for severe blood conditions like anemia. You can donate blood to be used on yourself if you have a planned surgery. If you lose blood during that surgery, your own blood can be given back to you. Any blood given to you is checked to make sure it matches your blood type. Your temperature, blood pressure, and heart rate (vital signs) will be checked often.  GET HELP RIGHT AWAY IF:   You feel sick to your stomach (nauseous) or throw up (vomit).  You have watery poop (diarrhea).  You have shortness of breath or trouble breathing.  You have blood in your pee (urine) or have dark colored pee.  You have chest pain or tightness.  Your eyes or skin turn yellow (jaundice).  You have a temperature by mouth above 102 F (38.9 C), not controlled by medicine.  You start to shake and have chills.  You develop a a red rash (hives) or feel itchy.  You develop lightheadedness or feel confused.  You develop back, joint, or muscle pain.  You do not feel hungry (lost appetite).  You feel tired, restless, or nervous.  You develop belly (abdominal) cramps. Document Released: 12/31/2008 Document Revised: 12/27/2011 Document Reviewed: 12/31/2008 ExitCare Patient Information 2014 [employer].  

## 2013-09-28 NOTE — Progress Notes (Signed)
Requests refill for muscle cramps. Flexeril refilled.

## 2013-09-30 ENCOUNTER — Other Ambulatory Visit: Payer: Self-pay | Admitting: Hematology & Oncology

## 2013-09-30 LAB — TYPE AND SCREEN
ABO/RH(D): A NEG
Antibody Screen: NEGATIVE
Unit division: 0
Unit division: 0

## 2013-10-01 ENCOUNTER — Encounter: Payer: Self-pay | Admitting: Hematology & Oncology

## 2013-10-02 ENCOUNTER — Other Ambulatory Visit: Payer: Self-pay | Admitting: Hematology & Oncology

## 2013-10-08 ENCOUNTER — Encounter: Payer: Self-pay | Admitting: *Deleted

## 2013-10-08 ENCOUNTER — Ambulatory Visit (HOSPITAL_BASED_OUTPATIENT_CLINIC_OR_DEPARTMENT_OTHER): Payer: Medicare Other | Admitting: Hematology & Oncology

## 2013-10-08 ENCOUNTER — Ambulatory Visit (HOSPITAL_BASED_OUTPATIENT_CLINIC_OR_DEPARTMENT_OTHER): Payer: Medicare Other | Admitting: Lab

## 2013-10-08 ENCOUNTER — Ambulatory Visit: Payer: Medicare Other

## 2013-10-08 DIAGNOSIS — C9000 Multiple myeloma not having achieved remission: Secondary | ICD-10-CM

## 2013-10-08 DIAGNOSIS — D649 Anemia, unspecified: Secondary | ICD-10-CM

## 2013-10-08 LAB — CBC WITH DIFFERENTIAL (CANCER CENTER ONLY)
HGB: 10.5 g/dL — ABNORMAL LOW (ref 11.6–15.9)
MCHC: 31.7 g/dL — ABNORMAL LOW (ref 32.0–36.0)
RBC: 3.16 10*6/uL — ABNORMAL LOW (ref 3.70–5.32)
RDW: 23 % — ABNORMAL HIGH (ref 11.1–15.7)

## 2013-10-08 LAB — MANUAL DIFFERENTIAL (CHCC SATELLITE)
ALC: 3.3 10*3/uL — ABNORMAL HIGH (ref 0.6–2.2)
ANC (CHCC HP manual diff): 5.2 10*3/uL (ref 1.5–6.7)
Band Neutrophils: 5 % (ref 0–10)
LYMPH: 33 % (ref 14–48)
Myelocytes: 1 % — ABNORMAL HIGH (ref 0–0)
PLT EST ~~LOC~~: DECREASED
nRBC: 6 % — ABNORMAL HIGH (ref 0–0)

## 2013-10-08 NOTE — Progress Notes (Signed)
This office note has been dictated.

## 2013-10-08 NOTE — Progress Notes (Signed)
Called [facility] to make a referral.

## 2013-10-09 ENCOUNTER — Telehealth: Payer: Self-pay | Admitting: Hematology & Oncology

## 2013-10-09 NOTE — Telephone Encounter (Signed)
Left message with 1-5 appointment

## 2013-10-09 NOTE — Progress Notes (Signed)
CC:   [name], M.D.  DIAGNOSES: 1. 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 2. 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  CURRENT THERAPY: 1. Supportive care with transfusions. 2. IVIG 50 g every month. 3. Zometa 3.3 mg IV every month-depending on renal function. 4. Aranesp 300 mcg subcu as needed for hemoglobin less than 10.  INTERIM HISTORY:  [name] comes in for followup.  She comes in with her daughter and son-in-law.  It clearly is apparent at this point that her myelodysplasia is going to be her life-limiting disease.  She has significant and high grade myelodysplasia.  She has about 12 chromosomal abnormalities associated with the myelodysplasia.  She also has the 17P abnormality associated with her refractory myeloma.  She is not a candidate for any systemic therapy at this point in time. Her performance status is ECOG 3 at best.  She comes in a wheelchair. She does have a tough time walking around.  She is losing weight.  Her appetite is not that great.  I did put her on Marinol previously, but she is not taking this.  I spent about an hour with her and her daughter and son-in-law.  I explained to her what her problem is.  I explained to her why we cannot help this problem.  I have spoken to her doctor at [facility] and also her doctor, who actually did her transplant for the myeloma up in [place] 20 years ago.  They both agree with me that she has a serious problem now and that her prognosis will be dictated by her myelodysplasia given all of the chromosomal abnormalities associated with this.  [name] still does not want to "give up."  I told her that we were not given up, but that we just have to focus on her quality of life now and that her comfort and level of functioning is most important.  I stressed to her nutrition and how important nutrition was for her.  I told her that she continues to lose weight, that  she would decline much more quickly.  Her son-in-law did a great job in trying to convince her that she had to eat better and eat protein and better carbohydrates.  She has had no fever.  She has had no bleeding.  She has had no change in bowel or bladder habits.  We last transfused her, I think 10 days ago.  At that point, her hemoglobin was 7.9.  Of note, her kappa light chain, which had always been very difficult to measure now is up to 10 mg/dL, which is the highest I have even seen it for her.  She has had no mouth sores.  She has had no visual issues.  She has occasional headaches.  She is on quite a few medications, but most of these are only as needed.  Again, her performance status is at best ECOG 3.  PHYSICAL EXAMINATION:  General:  This is a frail white female, who is in no obvious distress.  Vital Signs:  Temperature of 98.1, pulse 77, respiratory rate 14, blood pressure 100/48, weight is 108 pounds.  Head and Neck:  Shows some temporal muscle wasting.  She has no scleral icterus.  Oral mucosa maybe slightly dry.  She has no adenopathy in the neck.  Lungs:  With slight decrease at the bases.  Cardiac:  Regular rate and rhythm with a normal S1 and S2.  There are no murmurs, rubs, or bruits.  Abdomen:  Soft.  She has decent bowel sounds.  There is no fluid wave.  There is no palpable hepatosplenomegaly.  Back:  Shows no tenderness over the spine, ribs, or hips.  Extremities:  Show chronic changes in her thighs from past radiation.  Neurological:  Shows no focal neurological deficits.  LABORATORY STUDIES:  White cell count 10, hemoglobin 10.5, hematocrit 33.1, platelet count 19.  MCV is 105.  IMPRESSION:  [name] is a 67 year old female.  She had myeloma now for over 20 years.  She is one of first patient who literally underwent transplant for this.  She now has survived long enough that she is suffering from the effects on her bone marrow.  The number  of chromosomal abnormalities clearly equates to a very poor prognosis.  Her kappa light chain is going up significantly and this also is an issue.  I again spent an hour with them.  I had to spend most of time with [name] to get her to understand that what she is dealing with is not curable nor treatable.  Again, our goal is to try to improve her quality of life.  I spoke to her about hospice.  Hospice will clearly help them out.  They are agreeable to hospice.  We will give a hospice call.  I have not spoken about code status with [name].  I do not think she is ready to make that decision yet.  I do think, however, that if she were to be kept alive on life support then she would never come off it.  We will have hospice follow up this week.  We will have them check her lab work weekly.  I want to follow [name] every 2 weeks right now.  I told her daughter, in private, that I really would not expect [name] to make it more than 2 or 3 months.  I think that if she were to get an infection, like pneumonia, that she would not survive this.  [name] is very tenuous.  She can really decline quickly if she does not eat and continues to lose weight.  I told her that she cannot drive.  I told her that it is not safe for her to drive and that she will put herself and others at risk.  She understands this.  This was quite a lot for [name] to listen to.  She really has tried to do as much as she can because she wants to be around for her grandchildren.  They are all very young.  I applauded [name] efforts to keep going, but we are at a point now where we just do not have anything that we can offer her and that we must make sure her quality of life is the priority.  I will get her back in 2 weeks.  When we see her back, we will do her IVIG and we will do her Zometa.  We will see about  Aranesp.    ______________________________ Josph Macho, M.D. PRE/MEDQ  D:  10/08/2013  T:  10/09/2013  Job:  [id number]

## 2013-10-10 ENCOUNTER — Other Ambulatory Visit: Payer: Self-pay | Admitting: *Deleted

## 2013-10-10 ENCOUNTER — Telehealth: Payer: Self-pay | Admitting: Hematology & Oncology

## 2013-10-10 ENCOUNTER — Encounter: Payer: Self-pay | Admitting: *Deleted

## 2013-10-10 MED ORDER — DIPHENOXYLATE-ATROPINE 2.5-0.025 MG PO TABS: 1.0000 | ORAL_TABLET | Freq: Four times a day (QID) | ORAL | Status: AC | PRN

## 2013-10-10 MED ORDER — ALPRAZOLAM 0.25 MG PO TABS: 0.2500 mg | ORAL_TABLET | Freq: Three times a day (TID) | ORAL | Status: AC | PRN

## 2013-10-10 NOTE — Progress Notes (Signed)
Received call from [facility] admission nurse.  Asked what labs Dr. Myna Hidalgo wanted.  Weekly CBC per dr. Myna Hidalgo and asked that they call us with results.  Left message on Lynns personal cell phone

## 2013-10-10 NOTE — Telephone Encounter (Signed)
Larita Fife from hospice called and requested office notes on patient's last visit.  10/08/13 office notes were printed out and faxed over to [phone].Marland Kitchen

## 2013-10-15 ENCOUNTER — Encounter (HOSPITAL_COMMUNITY): Payer: Self-pay | Admitting: Emergency Medicine

## 2013-10-15 ENCOUNTER — Ambulatory Visit (HOSPITAL_BASED_OUTPATIENT_CLINIC_OR_DEPARTMENT_OTHER)

## 2013-10-15 ENCOUNTER — Inpatient Hospital Stay (HOSPITAL_COMMUNITY)
Admission: EM | Admit: 2013-10-15 | Discharge: 2013-11-18 | DRG: 871 | Disposition: E | Attending: Hematology & Oncology | Admitting: Hematology & Oncology

## 2013-10-15 DIAGNOSIS — A419 Sepsis, unspecified organism: Principal | ICD-10-CM | POA: Diagnosis present

## 2013-10-15 DIAGNOSIS — Z993 Dependence on wheelchair: Secondary | ICD-10-CM

## 2013-10-15 DIAGNOSIS — R627 Adult failure to thrive: Secondary | ICD-10-CM | POA: Diagnosis present

## 2013-10-15 DIAGNOSIS — N179 Acute kidney failure, unspecified: Secondary | ICD-10-CM | POA: Diagnosis present

## 2013-10-15 DIAGNOSIS — J09X2 Influenza due to identified novel influenza A virus with other respiratory manifestations: Secondary | ICD-10-CM | POA: Diagnosis present

## 2013-10-15 DIAGNOSIS — E039 Hypothyroidism, unspecified: Secondary | ICD-10-CM | POA: Diagnosis present

## 2013-10-15 DIAGNOSIS — Z66 Do not resuscitate: Secondary | ICD-10-CM | POA: Diagnosis not present

## 2013-10-15 DIAGNOSIS — D631 Anemia in chronic kidney disease: Secondary | ICD-10-CM

## 2013-10-15 DIAGNOSIS — R17 Unspecified jaundice: Secondary | ICD-10-CM | POA: Diagnosis present

## 2013-10-15 DIAGNOSIS — N318 Other neuromuscular dysfunction of bladder: Secondary | ICD-10-CM | POA: Diagnosis present

## 2013-10-15 DIAGNOSIS — K7682 Hepatic encephalopathy: Secondary | ICD-10-CM | POA: Diagnosis present

## 2013-10-15 DIAGNOSIS — M8448XA Pathological fracture, other site, initial encounter for fracture: Secondary | ICD-10-CM | POA: Diagnosis present

## 2013-10-15 DIAGNOSIS — N189 Chronic kidney disease, unspecified: Secondary | ICD-10-CM | POA: Diagnosis present

## 2013-10-15 DIAGNOSIS — C9000 Multiple myeloma not having achieved remission: Secondary | ICD-10-CM

## 2013-10-15 DIAGNOSIS — Z515 Encounter for palliative care: Secondary | ICD-10-CM

## 2013-10-15 DIAGNOSIS — I209 Angina pectoris, unspecified: Secondary | ICD-10-CM | POA: Diagnosis present

## 2013-10-15 DIAGNOSIS — D469 Myelodysplastic syndrome, unspecified: Secondary | ICD-10-CM | POA: Diagnosis present

## 2013-10-15 DIAGNOSIS — D649 Anemia, unspecified: Secondary | ICD-10-CM | POA: Diagnosis present

## 2013-10-15 DIAGNOSIS — R0681 Apnea, not elsewhere classified: Secondary | ICD-10-CM | POA: Diagnosis not present

## 2013-10-15 DIAGNOSIS — E860 Dehydration: Secondary | ICD-10-CM | POA: Diagnosis present

## 2013-10-15 DIAGNOSIS — D696 Thrombocytopenia, unspecified: Secondary | ICD-10-CM | POA: Diagnosis present

## 2013-10-15 DIAGNOSIS — Z79899 Other long term (current) drug therapy: Secondary | ICD-10-CM

## 2013-10-15 DIAGNOSIS — J189 Pneumonia, unspecified organism: Secondary | ICD-10-CM | POA: Diagnosis present

## 2013-10-15 DIAGNOSIS — D4620 Refractory anemia with excess of blasts, unspecified: Secondary | ICD-10-CM

## 2013-10-15 DIAGNOSIS — R509 Fever, unspecified: Secondary | ICD-10-CM | POA: Diagnosis present

## 2013-10-15 DIAGNOSIS — R112 Nausea with vomiting, unspecified: Secondary | ICD-10-CM | POA: Diagnosis present

## 2013-10-15 DIAGNOSIS — F329 Major depressive disorder, single episode, unspecified: Secondary | ICD-10-CM | POA: Diagnosis present

## 2013-10-15 DIAGNOSIS — J9600 Acute respiratory failure, unspecified whether with hypoxia or hypercapnia: Secondary | ICD-10-CM | POA: Diagnosis present

## 2013-10-15 DIAGNOSIS — Z9181 History of falling: Secondary | ICD-10-CM

## 2013-10-15 DIAGNOSIS — F3289 Other specified depressive episodes: Secondary | ICD-10-CM | POA: Diagnosis present

## 2013-10-15 DIAGNOSIS — K7290 Hepatic failure, unspecified without coma: Secondary | ICD-10-CM | POA: Diagnosis present

## 2013-10-15 DIAGNOSIS — C9200 Acute myeloblastic leukemia, not having achieved remission: Secondary | ICD-10-CM | POA: Diagnosis present

## 2013-10-15 DIAGNOSIS — Z880 Allergy status to penicillin: Secondary | ICD-10-CM

## 2013-10-15 DIAGNOSIS — IMO0002 Reserved for concepts with insufficient information to code with codable children: Secondary | ICD-10-CM

## 2013-10-15 DIAGNOSIS — Z9481 Bone marrow transplant status: Secondary | ICD-10-CM

## 2013-10-15 DIAGNOSIS — K219 Gastro-esophageal reflux disease without esophagitis: Secondary | ICD-10-CM | POA: Diagnosis present

## 2013-10-15 DIAGNOSIS — J45909 Unspecified asthma, uncomplicated: Secondary | ICD-10-CM

## 2013-10-15 DIAGNOSIS — K7460 Unspecified cirrhosis of liver: Secondary | ICD-10-CM | POA: Diagnosis present

## 2013-10-15 LAB — MANUAL DIFFERENTIAL
ANC (CHCC manual diff): 5.5 10*3/uL (ref 1.5–6.5)
Band Neutrophils: 6 % (ref 0–10)
Basophil: 0 % (ref 0–2)
Blasts: 0 % (ref 0–0)
EOS: 2 % (ref 0–7)
LYMPH: 16 % (ref 14–49)
MONO: 6 % (ref 0–14)
PLT EST: DECREASED
nRBC: 9 % — ABNORMAL HIGH (ref 0–0)

## 2013-10-15 LAB — CBC WITH DIFFERENTIAL/PLATELET
HCT: 25.6 % — ABNORMAL LOW (ref 34.8–46.6)
HGB: 8.3 g/dL — ABNORMAL LOW (ref 11.6–15.9)
MCH: 33.3 pg (ref 25.1–34.0)
MCV: 102.8 fL — ABNORMAL HIGH (ref 79.5–101.0)
Platelets: 17 10*3/uL — ABNORMAL LOW (ref 145–400)
RDW: 23.8 % — ABNORMAL HIGH (ref 11.2–14.5)

## 2013-10-15 NOTE — ED Notes (Addendum)
Per EMS, at 1930 pt has vomited 7 times, been nauseous all day. Pt has been around people who have been sick with n/v, diarrhea, and fever. Pts only c/o nausea and fever. A&Ox4. Family says pt has fever of 102.7. Pt given 1000mg  of tylenol and 4 of zofran IM.

## 2013-10-15 NOTE — ED Notes (Signed)
Bed: WA19 Expected date:  Expected time:  Means of arrival:  Comments: EMS-N/V 

## 2013-10-16 ENCOUNTER — Emergency Department (HOSPITAL_COMMUNITY)

## 2013-10-16 ENCOUNTER — Inpatient Hospital Stay (HOSPITAL_COMMUNITY)

## 2013-10-16 ENCOUNTER — Encounter (HOSPITAL_COMMUNITY): Payer: Self-pay | Admitting: Internal Medicine

## 2013-10-16 DIAGNOSIS — D46Z Other myelodysplastic syndromes: Secondary | ICD-10-CM

## 2013-10-16 DIAGNOSIS — R112 Nausea with vomiting, unspecified: Secondary | ICD-10-CM | POA: Diagnosis present

## 2013-10-16 DIAGNOSIS — D649 Anemia, unspecified: Secondary | ICD-10-CM | POA: Diagnosis present

## 2013-10-16 DIAGNOSIS — E860 Dehydration: Secondary | ICD-10-CM | POA: Diagnosis present

## 2013-10-16 DIAGNOSIS — R509 Fever, unspecified: Secondary | ICD-10-CM

## 2013-10-16 DIAGNOSIS — D696 Thrombocytopenia, unspecified: Secondary | ICD-10-CM

## 2013-10-16 DIAGNOSIS — J1189 Influenza due to unidentified influenza virus with other manifestations: Secondary | ICD-10-CM

## 2013-10-16 DIAGNOSIS — R197 Diarrhea, unspecified: Secondary | ICD-10-CM

## 2013-10-16 DIAGNOSIS — A419 Sepsis, unspecified organism: Secondary | ICD-10-CM | POA: Diagnosis present

## 2013-10-16 LAB — COMPREHENSIVE METABOLIC PANEL
ALT: 76 U/L — ABNORMAL HIGH (ref 0–35)
ALT: 66 U/L — ABNORMAL HIGH (ref 0–35)
AST: 66 U/L — ABNORMAL HIGH (ref 0–37)
Albumin: 2.7 g/dL — ABNORMAL LOW (ref 3.5–5.2)
Alkaline Phosphatase: 202 U/L — ABNORMAL HIGH (ref 39–117)
BUN: 45 mg/dL — ABNORMAL HIGH (ref 6–23)
BUN: 45 mg/dL — ABNORMAL HIGH (ref 6–23)
CO2: 15 mEq/L — ABNORMAL LOW (ref 19–32)
Calcium: 7.8 mg/dL — ABNORMAL LOW (ref 8.4–10.5)
Chloride: 99 mEq/L (ref 96–112)
Creatinine, Ser: 2.43 mg/dL — ABNORMAL HIGH (ref 0.50–1.10)
GFR calc Af Amer: 23 mL/min — ABNORMAL LOW (ref >90)
GFR calc non Af Amer: 20 mL/min — ABNORMAL LOW (ref >90)
Glucose, Bld: 134 mg/dL — ABNORMAL HIGH (ref 70–99)
Potassium: 4.6 mEq/L (ref 3.7–5.3)
Sodium: 134 mEq/L — ABNORMAL LOW (ref 137–147)
Total Bilirubin: 6.4 mg/dL — ABNORMAL HIGH (ref 0.3–1.2)
Total Protein: 5 g/dL — ABNORMAL LOW (ref 6.0–8.3)
Total Protein: 4.8 g/dL — ABNORMAL LOW (ref 6.0–8.3)

## 2013-10-16 LAB — POCT I-STAT, CHEM 8
BUN: 42 mg/dL — ABNORMAL HIGH (ref 6–23)
Chloride: 100 mEq/L (ref 96–112)
HCT: 27 % — ABNORMAL LOW (ref 36.0–46.0)
Hemoglobin: 9.2 g/dL — ABNORMAL LOW (ref 12.0–15.0)
Potassium: 4.3 mEq/L (ref 3.7–5.3)
Sodium: 135 mEq/L — ABNORMAL LOW (ref 137–147)

## 2013-10-16 LAB — CBC
HCT: 22 % — ABNORMAL LOW (ref 36.0–46.0)
Hemoglobin: 7.1 g/dL — ABNORMAL LOW (ref 12.0–15.0)
MCH: 34.1 pg — ABNORMAL HIGH (ref 26.0–34.0)
MCHC: 33.2 g/dL (ref 30.0–36.0)
MCV: 105.8 fL — ABNORMAL HIGH (ref 78.0–100.0)
Platelets: 13 10*3/uL — CL (ref 150–400)
RBC: 2.08 MIL/uL — ABNORMAL LOW (ref 3.87–5.11)
RDW: 23.6 % — ABNORMAL HIGH (ref 11.5–15.5)
WBC: 14.4 10*3/uL — ABNORMAL HIGH (ref 4.0–10.5)

## 2013-10-16 LAB — INFLUENZA PANEL BY PCR (TYPE A & B)
H1N1 flu by pcr: NOT DETECTED
Influenza A By PCR: NEGATIVE
Influenza B By PCR: POSITIVE — AB

## 2013-10-16 LAB — CREATININE, URINE, RANDOM: Creatinine, Urine: 34.1 mg/dL

## 2013-10-16 LAB — URINALYSIS, ROUTINE W REFLEX MICROSCOPIC
Bilirubin Urine: NEGATIVE
Glucose, UA: NEGATIVE mg/dL
Ketones, ur: NEGATIVE mg/dL
Leukocytes, UA: NEGATIVE
Nitrite: NEGATIVE
Specific Gravity, Urine: 1.008 (ref 1.005–1.030)
pH: 5.5 (ref 5.0–8.0)

## 2013-10-16 LAB — HEPATITIS PANEL, ACUTE
HCV Ab: NEGATIVE
Hep A IgM: NONREACTIVE
Hep B C IgM: NONREACTIVE

## 2013-10-16 LAB — PROCALCITONIN: Procalcitonin: 6.58 ng/mL

## 2013-10-16 LAB — CG4 I-STAT (LACTIC ACID): Lactic Acid, Venous: 1.82 mmol/L (ref 0.5–2.2)

## 2013-10-16 LAB — LIPASE, BLOOD: Lipase: 23 U/L (ref 11–59)

## 2013-10-16 LAB — PREPARE RBC (CROSSMATCH)

## 2013-10-16 LAB — SODIUM, URINE, RANDOM: Sodium, Ur: 51 mEq/L

## 2013-10-16 MED ORDER — VANCOMYCIN HCL 500 MG IV SOLR: 500.0000 mg | INTRAVENOUS | Status: DC

## 2013-10-16 MED ORDER — ONDANSETRON HCL 4 MG/2ML IJ SOLN
4.0000 mg | Freq: Once | INTRAMUSCULAR | Status: AC
  Administered 2013-10-16: 4 mg via INTRAVENOUS
  Filled 2013-10-16: qty 2

## 2013-10-16 MED ORDER — HYDROMORPHONE HCL PF 1 MG/ML IJ SOLN
0.2500 mg | INTRAMUSCULAR | Status: DC | PRN
  Administered 2013-10-16 – 2013-10-18 (×5): 0.25 mg via INTRAVENOUS
  Filled 2013-10-16 (×4): qty 1

## 2013-10-16 MED ORDER — SODIUM CHLORIDE 0.9 % IV BOLUS (SEPSIS)
1000.0000 mL | Freq: Once | INTRAVENOUS | Status: AC
  Administered 2013-10-16: 1000 mL via INTRAVENOUS

## 2013-10-16 MED ORDER — OSELTAMIVIR PHOSPHATE 75 MG PO CAPS: 75.0000 mg | ORAL_CAPSULE | Freq: Every day | ORAL | Status: DC

## 2013-10-16 MED ORDER — VANCOMYCIN HCL IN DEXTROSE 1-5 GM/200ML-% IV SOLN
1000.0000 mg | Freq: Once | INTRAVENOUS | Status: AC
  Administered 2013-10-16: 1000 mg via INTRAVENOUS
  Filled 2013-10-16: qty 200

## 2013-10-16 MED ORDER — SODIUM CHLORIDE 0.9 % IV SOLN: INTRAVENOUS | Status: DC

## 2013-10-16 MED ORDER — PANTOPRAZOLE SODIUM 40 MG PO TBEC
40.0000 mg | DELAYED_RELEASE_TABLET | Freq: Every day | ORAL | Status: DC
  Administered 2013-10-16 – 2013-10-22 (×7): 40 mg via ORAL
  Filled 2013-10-16 (×8): qty 1

## 2013-10-16 MED ORDER — DEXTROSE 5 % IV SOLN
2.0000 g | Freq: Every day | INTRAVENOUS | Status: DC
  Filled 2013-10-16: qty 2

## 2013-10-16 MED ORDER — SODIUM CHLORIDE 0.9 % IV SOLN: 500.0000 mL | Freq: Once | INTRAVENOUS | Status: DC

## 2013-10-16 MED ORDER — GUAIFENESIN-DM 100-10 MG/5ML PO SYRP: 5.0000 mL | ORAL_SOLUTION | ORAL | Status: DC | PRN

## 2013-10-16 MED ORDER — DIPHENHYDRAMINE HCL 12.5 MG/5ML PO ELIX
12.5000 mg | ORAL_SOLUTION | Freq: Once | ORAL | Status: AC
  Administered 2013-10-16: 12.5 mg via ORAL
  Filled 2013-10-16: qty 5

## 2013-10-16 MED ORDER — BIOTENE DRY MOUTH MT LIQD
15.0000 mL | Freq: Two times a day (BID) | OROMUCOSAL | Status: DC
  Administered 2013-10-16 – 2013-10-18 (×3): 15 mL via OROMUCOSAL

## 2013-10-16 MED ORDER — MOMETASONE FURO-FORMOTEROL FUM 100-5 MCG/ACT IN AERO
2.0000 | INHALATION_SPRAY | Freq: Two times a day (BID) | RESPIRATORY_TRACT | Status: DC
  Administered 2013-10-16 – 2013-10-19 (×6): 2 via RESPIRATORY_TRACT
  Filled 2013-10-16: qty 8.8

## 2013-10-16 MED ORDER — OLOPATADINE HCL 0.1 % OP SOLN
1.0000 [drp] | Freq: Two times a day (BID) | OPHTHALMIC | Status: DC | PRN
  Filled 2013-10-16: qty 5

## 2013-10-16 MED ORDER — DEXTROSE 5 % IV SOLN
1.0000 g | Freq: Every day | INTRAVENOUS | Status: DC
  Administered 2013-10-16: 1 g via INTRAVENOUS
  Filled 2013-10-16: qty 1

## 2013-10-16 MED ORDER — SODIUM CHLORIDE 0.9 % IJ SOLN
3.0000 mL | Freq: Two times a day (BID) | INTRAMUSCULAR | Status: DC
  Administered 2013-10-16 – 2013-10-21 (×6): 3 mL via INTRAVENOUS

## 2013-10-16 MED ORDER — OSELTAMIVIR PHOSPHATE 75 MG PO CAPS
75.0000 mg | ORAL_CAPSULE | Freq: Two times a day (BID) | ORAL | Status: DC
  Administered 2013-10-16: 75 mg via ORAL
  Filled 2013-10-16 (×3): qty 1

## 2013-10-16 MED ORDER — ALBUTEROL SULFATE HFA 108 (90 BASE) MCG/ACT IN AERS: 1.0000 | INHALATION_SPRAY | Freq: Four times a day (QID) | RESPIRATORY_TRACT | Status: DC | PRN

## 2013-10-16 MED ORDER — ONDANSETRON HCL 4 MG/2ML IJ SOLN
4.0000 mg | Freq: Four times a day (QID) | INTRAMUSCULAR | Status: DC | PRN
  Administered 2013-10-16 – 2013-10-19 (×2): 4 mg via INTRAVENOUS
  Filled 2013-10-16 (×4): qty 2

## 2013-10-16 MED ORDER — DEXTROSE 5 % IV SOLN
500.0000 mg | INTRAVENOUS | Status: DC
  Administered 2013-10-16: 500 mg via INTRAVENOUS
  Filled 2013-10-16: qty 500

## 2013-10-16 MED ORDER — TRAMADOL HCL 50 MG PO TABS
50.0000 mg | ORAL_TABLET | Freq: Two times a day (BID) | ORAL | Status: DC | PRN
  Administered 2013-10-17 – 2013-10-18 (×2): 50 mg via ORAL
  Filled 2013-10-16 (×2): qty 1

## 2013-10-16 MED ORDER — ACETAMINOPHEN 650 MG RE SUPP: 650.0000 mg | Freq: Four times a day (QID) | RECTAL | Status: DC | PRN

## 2013-10-16 MED ORDER — DRONABINOL 2.5 MG PO CAPS: 2.5000 mg | ORAL_CAPSULE | Freq: Two times a day (BID) | ORAL | Status: DC

## 2013-10-16 MED ORDER — MORPHINE SULFATE 2 MG/ML IJ SOLN: 2.0000 mg | INTRAMUSCULAR | Status: DC | PRN

## 2013-10-16 MED ORDER — DULOXETINE HCL 60 MG PO CPEP: 60.0000 mg | ORAL_CAPSULE | Freq: Every day | ORAL | Status: DC

## 2013-10-16 MED ORDER — GUAIFENESIN ER 600 MG PO TB12
600.0000 mg | ORAL_TABLET | Freq: Two times a day (BID) | ORAL | Status: DC
  Administered 2013-10-16 – 2013-10-20 (×9): 600 mg via ORAL
  Filled 2013-10-16 (×12): qty 1

## 2013-10-16 MED ORDER — TEMAZEPAM 15 MG PO CAPS: 30.0000 mg | ORAL_CAPSULE | Freq: Every evening | ORAL | Status: DC | PRN

## 2013-10-16 MED ORDER — ONDANSETRON HCL 4 MG PO TABS
4.0000 mg | ORAL_TABLET | Freq: Four times a day (QID) | ORAL | Status: DC | PRN
  Administered 2013-10-18: 4 mg via ORAL
  Filled 2013-10-16: qty 1

## 2013-10-16 MED ORDER — ACETAMINOPHEN 325 MG PO TABS
650.0000 mg | ORAL_TABLET | Freq: Once | ORAL | Status: AC
  Administered 2013-10-16: 650 mg via ORAL
  Filled 2013-10-16: qty 2

## 2013-10-16 MED ORDER — ACETAMINOPHEN 325 MG PO TABS
650.0000 mg | ORAL_TABLET | Freq: Four times a day (QID) | ORAL | Status: DC | PRN
  Administered 2013-10-16 – 2013-10-17 (×2): 650 mg via ORAL
  Filled 2013-10-16 (×2): qty 2

## 2013-10-16 MED ORDER — BENZONATATE 100 MG PO CAPS
100.0000 mg | ORAL_CAPSULE | Freq: Three times a day (TID) | ORAL | Status: DC | PRN
  Administered 2013-10-18 – 2013-10-20 (×2): 100 mg via ORAL
  Filled 2013-10-16 (×2): qty 1

## 2013-10-16 MED ORDER — FESOTERODINE FUMARATE ER 4 MG PO TB24
4.0000 mg | ORAL_TABLET | Freq: Every day | ORAL | Status: DC
  Filled 2013-10-16: qty 1

## 2013-10-16 MED ORDER — PANCRELIPASE (LIP-PROT-AMYL) 12000-38000 UNITS PO CPEP
3.0000 | ORAL_CAPSULE | Freq: Every day | ORAL | Status: DC
  Filled 2013-10-16: qty 3

## 2013-10-16 MED ORDER — ACYCLOVIR 400 MG PO TABS
400.0000 mg | ORAL_TABLET | Freq: Two times a day (BID) | ORAL | Status: DC
  Administered 2013-10-16 – 2013-10-17 (×4): 400 mg via ORAL
  Filled 2013-10-16 (×6): qty 1

## 2013-10-16 MED ORDER — SODIUM CHLORIDE 0.9 % IV SOLN
INTRAVENOUS | Status: DC
  Administered 2013-10-16 (×2): 100 mL/h via INTRAVENOUS
  Administered 2013-10-17 – 2013-10-22 (×4): via INTRAVENOUS

## 2013-10-16 MED ORDER — ALBUTEROL SULFATE (2.5 MG/3ML) 0.083% IN NEBU
2.5000 mg | INHALATION_SOLUTION | RESPIRATORY_TRACT | Status: DC | PRN
  Administered 2013-10-17: 2.5 mg via RESPIRATORY_TRACT
  Filled 2013-10-16: qty 3

## 2013-10-16 MED ORDER — LEVOTHYROXINE SODIUM 50 MCG PO TABS
50.0000 ug | ORAL_TABLET | Freq: Every day | ORAL | Status: DC
  Administered 2013-10-16 – 2013-10-21 (×6): 50 ug via ORAL
  Filled 2013-10-16 (×8): qty 1

## 2013-10-16 MED ORDER — HYDROMORPHONE HCL PF 1 MG/ML IJ SOLN
INTRAMUSCULAR | Status: AC
  Filled 2013-10-16: qty 1

## 2013-10-16 MED ORDER — OSELTAMIVIR PHOSPHATE 30 MG PO CAPS
30.0000 mg | ORAL_CAPSULE | Freq: Every day | ORAL | Status: AC
  Administered 2013-10-17 – 2013-10-20 (×4): 30 mg via ORAL
  Filled 2013-10-16 (×4): qty 1

## 2013-10-16 MED ORDER — ALPRAZOLAM 0.25 MG PO TABS: 0.2500 mg | ORAL_TABLET | Freq: Three times a day (TID) | ORAL | Status: DC | PRN

## 2013-10-16 MED ORDER — DIPHENHYDRAMINE HCL 25 MG PO CAPS
25.0000 mg | ORAL_CAPSULE | Freq: Once | ORAL | Status: AC
  Administered 2013-10-16: 25 mg via ORAL
  Filled 2013-10-16: qty 1

## 2013-10-16 NOTE — Progress Notes (Signed)
ANTIBIOTIC CONSULT NOTE - INITIAL  Pharmacy Consult for Vancomycin/cefepime Indication: rule out sepsis  Allergies  Allergen Reactions  . Trazodone And Nefazodone Other (See Comments)    nightmares  . Vicodin [Hydrocodone-Acetaminophen] Itching    Pt tried to take again and had itching.  Marland Kitchen Hydromorphone Itching  . Nefazodone Hives    nightmares  . Tramadol Itching  . Trazodone Hives    nightmares  . Morphine Rash  . Morphine And Related Rash  . Penicillins Rash    Patient Measurements: Height: 4\' 10"  (147.3 cm) Weight: 108 lb (48.988 kg) IBW/kg (Calculated) : 40.9 Adjusted Body Weight:   Vital Signs: Temp: 98.4 F (36.9 C) (12/30 0603) Temp src: Oral (12/30 0242) BP: 98/45 mmHg (12/30 0545) Pulse Rate: 78 (12/30 0545) Intake/Output from previous day:   Intake/Output from this shift:    Labs:  Recent Labs  09/29/2013 1537 10/16/13 0155 10/16/13 0231  WBC 10.4* 14.4*  --   HGB 8.3* 8.1* 9.2*  PLT 17* 13*  --   CREATININE  --  2.40* 2.80*   Estimated Creatinine Clearance: 12.8 ml/min (by C-G formula based on Cr of 2.8). No results found for this basename: VANCOTROUGH, VANCOPEAK, VANCORANDOM, GENTTROUGH, GENTPEAK, GENTRANDOM, TOBRATROUGH, TOBRAPEAK, TOBRARND, AMIKACINPEAK, AMIKACINTROU, AMIKACIN,  in the last 72 hours   Microbiology: No results found for this or any previous visit (from the past 720 hour(s)).  Medical History: Past Medical History  Diagnosis Date  . Myeloma   . Anxiety   . Hypothyroidism   . GERD (gastroesophageal reflux disease)   . Myeloma   . OAB (overactive bladder)   . Osteoporosis   . Depression   . Chronic pain   . Renal insufficiency     Medications:  Anti-infectives   Start     Dose/Rate Route Frequency Ordered Stop   10/18/13 1800  vancomycin (VANCOCIN) 500 mg in sodium chloride 0.9 % 100 mL IVPB     500 mg 100 mL/hr over 60 Minutes Intravenous Every 48 hours 10/16/13 0638     10/16/13 1000  acyclovir (ZOVIRAX) tablet  400 mg     400 mg Oral 2 times daily 10/16/13 0620     10/16/13 0645  vancomycin (VANCOCIN) IVPB 1000 mg/200 mL premix     1,000 mg 200 mL/hr over 60 Minutes Intravenous  Once 10/16/13 1610     10/16/13 0645  ceFEPIme (MAXIPIME) 2 g in dextrose 5 % 50 mL IVPB  Status:  Discontinued     2 g 100 mL/hr over 30 Minutes Intravenous Daily 10/16/13 0633 10/16/13 0637   10/16/13 0645  ceFEPIme (MAXIPIME) 1 g in dextrose 5 % 50 mL IVPB     1 g 100 mL/hr over 30 Minutes Intravenous Daily 10/16/13 0637     10/16/13 0630  azithromycin (ZITHROMAX) 500 mg in dextrose 5 % 250 mL IVPB     500 mg 250 mL/hr over 60 Minutes Intravenous Every 24 hours 10/16/13 0620     10/16/13 0530  oseltamivir (TAMIFLU) capsule 75 mg     75 mg Oral 2 times daily 10/16/13 0503 10/21/13 0959     Assessment: Patient with sepsis.  Patient with very poor renal function. Patient has gotten 75mg  tamiflu in ED x1.  Goal of Therapy:  Vancomycin trough level 15-20 mcg/ml Cefepime dosed based on patient weight and renal function   Plan:  Measure antibiotic drug levels at steady state Follow up culture results Vancomycin 500mg  iv q48hr, after 1gm load Cefepime 1gm iv  q24hr Also adjusted tamiflu for patients very poor renal function to 30mg  po daily for total of 5 days, per manufacturer dosing  Darlina Guys, Jacquenette Shone Crowford 10/16/2013,6:40 AM

## 2013-10-16 NOTE — Progress Notes (Addendum)
[facility] PROGRESS NOTE  [name] ZOX:[id number] DOB: [birth year] DOA: 2013-11-06 PCP: Lillia Mountain, MD  Family doesn't consider Dr. Valentina Lucks their PCP.  They consider Dr. Myna Hidalgo the PCP  67 yo female with h/o myelodysplasia who was placed on hospice care 12/24.  Presented with N/V/D, hypotensive and hypoxic.  Assessment/Plan:    Acute hypoxic respiratory failure secondary to influenza B  tami flu started Supportive care.   Family is aware patient's prognosis is poor Family is currently teetering between full comfort and additional medical treatment.  They are agreed in DNR/DNI.  Will order additional labs to determine if antibiotics are helping or if they should be discontinued.  If full comfort is agreed on the patient will be transferred out of the ICU.  Discussed with Dr. Rito Ehrlich.  He will meet with the family.  Acute renal failure Secondary to significant diarrhea Likely Influenza C-diff pending. IV fluids and monitor. Will reduce nephrotoxic medications (vanc) if possible.  Hypotension SBP is the 70s.   Received 3 1L bolus of NS. Again - family teetering between full comfort and more aggressive care such as pressors.  Elevated transaminases. 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 Dr. Myna Hidalgo visited the patient this morning. She was referred to hospice in December as Onc did not feel she was a candidate for any systemic therapy.  Chest pressure Likely angina due to demand. Troponins thus far are negative.      DVT Prophylaxis:  SCD  Code Status: DNR/DNI Family Communication: Meet with son, 2 daughters and son in law Disposition Plan: inpatient, GIP?  Consultants:  Dr. Myna Hidalgo has seen the patient.  Social work consulted for Possible GIP.  Procedures:    Antibiotics:  vanc  Cefepime  Azith  HPI/Subjective: Family with many questions about prognosis.  Patient mildly confused  but comfortable.  Objective: Filed Vitals:   10/16/13 1100 10/16/13 1115 10/16/13 1130 10/16/13 1145  BP: 70/47 70/27 75/18  74/39  Pulse: 102 102 101 104  Temp:   100.6 F (38.1 C) 100.9 F (38.3 C)  TempSrc:   Oral Oral  Resp: 24 24 25 22   Height:      Weight:      SpO2: 96% 100% 99% 99%    Intake/Output Summary (Last 24 hours) at 10/16/13 1259 Last data filed at 10/16/13 1157  Gross per 24 hour  Intake  812.5 ml  Output     55 ml  Net  757.5 ml   Filed Weights   10/16/13 0635  Weight: 48.988 kg (108 lb)    Exam: General: Awake, mildly confused, comfortable, takes off non re-breather mask. HEENT:  PERR, EOMI, mild icteric Sclera, MMM. No pharyngeal erythema or exudates  Neck: Supple, no JVD, no masses  Cardiovascular: RRR, S1 S2 auscultated, no rubs, murmurs or gallops.   Respiratory: Clear to auscultation bilaterally with equal chest rise  Abdomen: Soft, obese, nontender, nondistended, + bowel sounds  Extremities: warm dry without cyanosis clubbing or edema.  Skin: Without rashes exudates or nodules.   Psych: pleasantly confused, but cooperative.       Data Reviewed: Basic Metabolic Panel:  Recent Labs Lab 10/16/13 0155 10/16/13 0231  NA 134* 135*  K 4.6 4.3  CL 99 100  CO2 18*  --   GLUCOSE 154* 150*  BUN 45* 42*  CREATININE 2.40* 2.80*  CALCIUM 9.5  --    Liver Function Tests:  Recent Labs Lab 10/16/13 0155  AST 66*  ALT 76*  ALKPHOS 202*  BILITOT 6.4*  PROT 5.0*  ALBUMIN 2.7*    Recent Labs Lab 10/16/13 0155  LIPASE 23   CBC:  Recent Labs Lab 09/18/2013 1537 10/16/13 0155 10/16/13 0231  WBC 10.4* 14.4*  --   HGB 8.3* 8.1* 9.2*  HCT 25.6* 24.4* 27.0*  MCV 102.8* 103.0*  --   PLT 17* 13*  --    Cardiac Enzymes:  Recent Labs Lab 10/16/13 0740  TROPONINI <0.30     Recent Results (from the past 240 hour(s))  MRSA PCR SCREENING     Status: None   Collection Time    10/16/13  7:26 AM      Result Value Range Status    MRSA by PCR NEGATIVE  NEGATIVE Final   Comment:            The GeneXpert MRSA Assay (FDA     approved for NASAL specimens     only), is one component of a     comprehensive MRSA colonization     surveillance program. It is not     intended to diagnose MRSA     infection nor to guide or     monitor treatment for     MRSA infections.     Studies: Dg Chest 2 View  10/16/2013   CLINICAL DATA:  Cough, shortness of breath, chest discomfort. History of multiple myeloma.  EXAM: CHEST  2 VIEW  COMPARISON:  10/16/2013  FINDINGS: Shallow inspiration with elevation of right hemidiaphragm. Normal heart size and pulmonary vascularity. No focal airspace disease in the lungs. Multiple old, acute, and healing rib fractures bilaterally compatible with history of myeloma. No definite evidence of any new fractures since previous study. Degenerative changes in the spine.  IMPRESSION: No evidence of active pulmonary disease. Multiple bilateral rib fractures of varying ages.   Electronically Signed   By: Burman Nieves M.D.   On: 10/16/2013 06:39   US Abdomen Complete  10/16/2013   CLINICAL DATA:  Abdominal pain. Nausea, vomiting, and diarrhea. History of multiple myeloma and previous cholecystectomy.  EXAM: ULTRASOUND ABDOMEN COMPLETE  COMPARISON:  CT abdomen and pelvis 02/07/2012  FINDINGS: Gallbladder:  Surgically absent.  Common bile duct:  Diameter: 12 mm, normal for post cholecystectomy patient.  Liver:  Liver appears large with somewhat lobular architecture. Heterogeneous parenchymal echotexture. Small nodular changes suggested. Findings may represent cirrhosis or geographic fatty infiltration. Doppler evaluation of main portal vein shows flow in the appropriate direction.  IVC:  No abnormality visualized.  Pancreas:  Visualized portion unremarkable.  Spleen:  Spleen is enlarged with length measurement of 15.4 cm and volume measurement of 741 ml.  Right Kidney:  Length: 8.4 cm.  Diffuse parenchymal atrophy.   No hydronephrosis.  Left Kidney:  Length: 9.8 cm. Diffuse parenchymal atrophy. Probable small cyst arising from the midpole and measuring about 1 cm diameter. The cyst was also demonstrated on previous CT scan. No hydronephrosis.  Abdominal aorta:  No aneurysm visualized.  Other findings:  None.  IMPRESSION: Surgical absence of the gallbladder with physiologic bile duct prominence. Heterogeneous appearance of the liver with heterogeneous nodular architecture suggesting fatty infiltration or cirrhosis. Splenic enlargement. Bilateral renal atrophy. Small left renal cyst.   Electronically Signed   By: Burman Nieves M.D.   On: 10/16/2013 05:23   Dg Chest Portable 1 View  10/16/2013   CLINICAL DATA:  Nausea, fever.  EXAM: PORTABLE CHEST - 1 VIEW  COMPARISON:  Chest radiograph June 05, 2012  FINDINGS: Cardiomediastinal silhouette is unremarkable for this low inspiratory portable examination with crowded vasculature markings. The lungs are clear without pleural effusions or focal consolidations. Trachea projects midline and there is no pneumothorax. Multiple remote bilateral rib fractures. Multiple EKG lines overlie the patient and may obscure subtle underlying pathology.  IMPRESSION: No acute cardiopulmonary process.   Electronically Signed   By: Awilda Metro   On: 10/16/2013 04:26    Scheduled Meds: . sodium chloride  500 mL Intravenous Once  . acyclovir  400 mg Oral BID  . azithromycin  500 mg Intravenous Q24H  . ceFEPime (MAXIPIME) IV  1 g Intravenous Q0600  . guaiFENesin  600 mg Oral BID  . levothyroxine  50 mcg Oral QAC breakfast  . mometasone-formoterol  2 puff Inhalation BID  . [START ON 10/17/2013] oseltamivir  30 mg Oral Daily  . pantoprazole  40 mg Oral Daily  . sodium chloride  3 mL Intravenous Q12H  . [START ON 10/18/2013] vancomycin  500 mg Intravenous Q48H   Continuous Infusions: . sodium chloride      Active Problems:   Multiple myeloma   Dehydration   Nausea, vomiting,  and diarrhea   Thrombocytopenia   Anemia   Acute febrile illness   Sepsis    Conley Canal  [facility] Pager [phone]. If 7PM-7AM, please contact night-coverage at [email or web address], password [facility] 10/16/2013, 12:59 PM  LOS: 1 day   Patient seen after my PA. We had a long discussion about her goals of care. Despite broad-spectrum antibiotics, patient still having temperatures. Her influenza B swab was positive. She understands that she is likely actively dying. Blood pressures are down to 60 systolic. Discussed this with family. Patient is DO NOT RESUSCITATE status. Had discontinued most medications except for Tamiflu, IV fluids. Oxygen at nasal cannula. Transfer to floor. Had chaplain come to visit.

## 2013-10-16 NOTE — Progress Notes (Signed)
Nutrition Brief Note  Patient identified on the Malnutrition Screening Tool (MST) Report and Low Braden  Wt Readings from Last 15 Encounters:  10/16/13 108 lb (48.988 kg)  10/08/13 108 lb (48.988 kg)  09/17/13 111 lb (50.349 kg)  08/13/13 112 lb (50.803 kg)  07/30/13 116 lb (52.617 kg)  07/05/13 118 lb (53.524 kg)  05/29/13 116 lb (52.617 kg)  05/22/13 117 lb (53.071 kg)  04/17/13 116 lb (52.617 kg)  03/20/13 120 lb (54.432 kg)  03/07/13 113 lb (51.256 kg)  03/01/13 120 lb (54.432 kg)  02/28/13 118 lb (53.524 kg)  02/15/13 118 lb (53.524 kg)  01/08/13 120 lb (54.432 kg)   Pt screened for Malnutrition Screening Tool and Low Braden. Weight and appetite has been decreasing per MD note. Pt to be d/c with home hospice. Family declined nutritional supplementation or any other nutrition interventions at this time.  Body mass index is 22.58 kg/(m^2). Patient meets criteria for Normal weight based on current BMI.   Current diet order is Regular, patient is consuming approximately 0% of meals at this time as diet was advanced at lunch and pt has not yet received trasy. Labs and medications reviewed.   No nutrition interventions warranted/requested at this time. If nutrition issues arise, please consult RD.   Lloyd Huger MS RD LDN Clinical Dietitian Pager:[phone]

## 2013-10-16 NOTE — ED Notes (Signed)
Pt. At 95% O2 on 5L. MD made aware.

## 2013-10-16 NOTE — Progress Notes (Signed)
CARE MANAGEMENT NOTE 10/16/2013  Patient:  EDIT, RICCIARDELLI   Account Number:  [id number]  Date Initiated:  10/16/2013  Documentation initiated by:  DAVIS,RHONDA  Subjective/Objective Assessment:   patient with history of Ca has home hospice and hospice has seen.  pt is a dnr but family wants aggressive treatments at this time     Action/Plan:   home with hospice   Anticipated DC Date:  10/19/2013   Anticipated DC Plan:  HOME W HOSPICE CARE  In-house referral  NA      DC Planning Services  CM consult      PAC Choice  HOSPICE   Choice offered to / List presented to:  NA   DME arranged  NA      DME agency  NA     HH arranged  NA      HH agency  NA   Status of service:  In process, will continue to follow Medicare Important Message given?  NA - LOS <3 / Initial given by admissions (If response is "NO", the following Medicare IM given date fields will be blank) Date Medicare IM given:   Date Additional Medicare IM given:    Discharge Disposition:    Per UR Regulation:  Reviewed for med. necessity/level of care/duration of stay  If discussed at Long Length of Stay Meetings, dates discussed:    Comments:  [id number]/Rhonda Stark Jock, BSN, [place] [phone] Chart Reviewed for discharge and hospital needs. Discharge needs at time of review:  None present will follow for needs. Review of patient progress due on [id number].

## 2013-10-16 NOTE — ED Provider Notes (Signed)
CSN: [id number]     Arrival date & time 11/12/13  2328 History   First MD Initiated Contact with Patient 10/16/13 0325     Chief Complaint  Patient presents with  . Nausea  . Emesis  . Fever   (Consider location/radiation/quality/duration/timing/severity/associated sxs/prior Treatment) HPI HX per PT - 67 yo female with h/o myelodysplasia presenting with N/V/D, onset today, found to be hypotensive and hypoxic. Some SOB, no CP. Fever tonight. Symptoms mod to severe. No blood in emesis or stools.   Past Medical History  Diagnosis Date  . Myeloma   . Anxiety   . Hypothyroidism   . GERD (gastroesophageal reflux disease)   . Myeloma   . OAB (overactive bladder)   . Osteoporosis   . Depression   . Chronic pain   . Renal insufficiency    Past Surgical History  Procedure Laterality Date  . Leg surgery    . Cholecystectomy    . Tonsillectomy    . Breast surgery    . Limbal stem cell transplant    . Abdominal hysterectomy    . Esophagogastroduodenoscopy  06/11/2012    Procedure: ESOPHAGOGASTRODUODENOSCOPY (EGD);  Surgeon: Willis Modena, MD;  Location: [facility];  Service: Endoscopy;  Laterality: N/A;   Family History  Problem Relation Age of Onset  . Other Mother 14    natural causes  . Lung cancer Father    History  Substance Use Topics  . Smoking status: Never Smoker   . Smokeless tobacco: Never Used  . Alcohol Use: No   OB History   Grav Para Term Preterm Abortions TAB SAB Ect Mult Living                 Review of Systems  Constitutional: Positive for fever and chills.  Respiratory: Positive for shortness of breath.   Cardiovascular: Negative for chest pain.  Gastrointestinal: Positive for nausea, vomiting and diarrhea.  Genitourinary: Negative for dysuria.  Musculoskeletal: Negative for back pain, neck pain and neck stiffness.  Skin: Negative for rash.  Neurological: Negative for headaches.  All other systems reviewed and are negative.    Allergies   Trazodone and nefazodone; Vicodin; Hydromorphone; Nefazodone; Tramadol; Trazodone; Morphine; Morphine and related; and Penicillins  Home Medications  No current outpatient prescriptions on file. BP 80/46  Pulse 88  Temp(Src) 99.3 F (37.4 C) (Oral)  Resp 18  Ht 4\' 10"  (1.473 m)  Wt 108 lb (48.988 kg)  BMI 22.58 kg/m2  SpO2 94% Physical Exam  Constitutional: She is oriented to person, place, and time. She appears well-developed and well-nourished.  HENT:  Head: Normocephalic and atraumatic.  Eyes: EOM are normal. Pupils are equal, round, and reactive to light.  Neck: Neck supple.  Cardiovascular: Regular rhythm and intact distal pulses.   Tachycardic 90-104  Pulmonary/Chest: Effort normal. No respiratory distress.  Abdominal: Soft. Bowel sounds are normal.  Mild diffuse tenderness  Musculoskeletal: Normal range of motion. She exhibits no edema.  Neurological: She is alert and oriented to person, place, and time.  Skin: Skin is warm and dry.    ED Course  Procedures (including critical care time) Labs Review Labs Reviewed  CBC - Abnormal; Notable for the following:    WBC 14.4 (*)    RBC 2.37 (*)    Hemoglobin 8.1 (*)    HCT 24.4 (*)    MCV 103.0 (*)    MCH 34.2 (*)    RDW 23.6 (*)    Platelets 13 (*)  All other components within normal limits  COMPREHENSIVE METABOLIC PANEL - Abnormal; Notable for the following:    Sodium 134 (*)    CO2 18 (*)    Glucose, Bld 154 (*)    BUN 45 (*)    Creatinine, Ser 2.40 (*)    Total Protein 5.0 (*)    Albumin 2.7 (*)    AST 66 (*)    ALT 76 (*)    Alkaline Phosphatase 202 (*)    Total Bilirubin 6.4 (*)    GFR calc non Af Amer 20 (*)    GFR calc Af Amer 23 (*)    All other components within normal limits  URINALYSIS, ROUTINE W REFLEX MICROSCOPIC - Abnormal; Notable for the following:    Hgb urine dipstick MODERATE (*)    Protein, ur 30 (*)    All other components within normal limits  INFLUENZA PANEL BY PCR - Abnormal;  Notable for the following:    Influenza B By PCR POSITIVE (*)    All other components within normal limits  URINE MICROSCOPIC-ADD ON - Abnormal; Notable for the following:    Squamous Epithelial / LPF FEW (*)    Bacteria, UA FEW (*)    All other components within normal limits  CBC - Abnormal; Notable for the following:    WBC 15.2 (*)    RBC 2.08 (*)    Hemoglobin 7.1 (*)    HCT 22.0 (*)    MCV 105.8 (*)    MCH 34.1 (*)    RDW 23.9 (*)    Platelets 60 (*)    All other components within normal limits  COMPREHENSIVE METABOLIC PANEL - Abnormal; Notable for the following:    Sodium 134 (*)    Potassium 3.4 (*)    CO2 15 (*)    Glucose, Bld 134 (*)    BUN 45 (*)    Creatinine, Ser 2.43 (*)    Calcium 7.8 (*)    Total Protein 4.8 (*)    Albumin 2.5 (*)    AST 65 (*)    ALT 66 (*)    Alkaline Phosphatase 162 (*)    Total Bilirubin 6.1 (*)    GFR calc non Af Amer 20 (*)    GFR calc Af Amer 23 (*)    All other components within normal limits  POCT I-STAT, CHEM 8 - Abnormal; Notable for the following:    Sodium 135 (*)    BUN 42 (*)    Creatinine, Ser 2.80 (*)    Glucose, Bld 150 (*)    Hemoglobin 9.2 (*)    HCT 27.0 (*)    All other components within normal limits  MRSA PCR SCREENING  STOOL CULTURE  CLOSTRIDIUM DIFFICILE BY PCR  LIPASE, BLOOD  CREATININE, URINE, RANDOM  SODIUM, URINE, RANDOM  HEPATITIS PANEL, ACUTE  PROCALCITONIN  TROPONIN I  FECAL LACTOFERRIN  MAGNESIUM  PHOSPHORUS  TSH  COMPREHENSIVE METABOLIC PANEL  CBC  CG4 I-STAT (LACTIC ACID)  TYPE AND SCREEN  PREPARE PLATELET PHERESIS  PREPARE RBC (CROSSMATCH)   Imaging Review Dg Chest 2 View  10/16/2013   CLINICAL DATA:  Cough, shortness of breath, chest discomfort. History of multiple myeloma.  EXAM: CHEST  2 VIEW  COMPARISON:  10/16/2013  FINDINGS: Shallow inspiration with elevation of right hemidiaphragm. Normal heart size and pulmonary vascularity. No focal airspace disease in the lungs. Multiple  old, acute, and healing rib fractures bilaterally compatible with history of myeloma. No definite evidence of any  new fractures since previous study. Degenerative changes in the spine.  IMPRESSION: No evidence of active pulmonary disease. Multiple bilateral rib fractures of varying ages.   Electronically Signed   By: Burman Nieves M.D.   On: 10/16/2013 06:39   US Abdomen Complete  10/16/2013   CLINICAL DATA:  Abdominal pain. Nausea, vomiting, and diarrhea. History of multiple myeloma and previous cholecystectomy.  EXAM: ULTRASOUND ABDOMEN COMPLETE  COMPARISON:  CT abdomen and pelvis 02/07/2012  FINDINGS: Gallbladder:  Surgically absent.  Common bile duct:  Diameter: 12 mm, normal for post cholecystectomy patient.  Liver:  Liver appears large with somewhat lobular architecture. Heterogeneous parenchymal echotexture. Small nodular changes suggested. Findings may represent cirrhosis or geographic fatty infiltration. Doppler evaluation of main portal vein shows flow in the appropriate direction.  IVC:  No abnormality visualized.  Pancreas:  Visualized portion unremarkable.  Spleen:  Spleen is enlarged with length measurement of 15.4 cm and volume measurement of 741 ml.  Right Kidney:  Length: 8.4 cm.  Diffuse parenchymal atrophy.  No hydronephrosis.  Left Kidney:  Length: 9.8 cm. Diffuse parenchymal atrophy. Probable small cyst arising from the midpole and measuring about 1 cm diameter. The cyst was also demonstrated on previous CT scan. No hydronephrosis.  Abdominal aorta:  No aneurysm visualized.  Other findings:  None.  IMPRESSION: Surgical absence of the gallbladder with physiologic bile duct prominence. Heterogeneous appearance of the liver with heterogeneous nodular architecture suggesting fatty infiltration or cirrhosis. Splenic enlargement. Bilateral renal atrophy. Small left renal cyst.   Electronically Signed   By: Burman Nieves M.D.   On: 10/16/2013 05:23   Dg Chest Portable 1 View  10/16/2013    CLINICAL DATA:  Nausea, fever.  EXAM: PORTABLE CHEST - 1 VIEW  COMPARISON:  Chest radiograph June 05, 2012  FINDINGS: Cardiomediastinal silhouette is unremarkable for this low inspiratory portable examination with crowded vasculature markings. The lungs are clear without pleural effusions or focal consolidations. Trachea projects midline and there is no pneumothorax. Multiple remote bilateral rib fractures. Multiple EKG lines overlie the patient and may obscure subtle underlying pathology.  IMPRESSION: No acute cardiopulmonary process.   Electronically Signed   By: Awilda Metro   On: 10/16/2013 04:26    EKG Interpretation    Date/Time:    Ventricular Rate:    PR Interval:    QRS Duration:   QT Interval:    QTC Calculation:   R Axis:     Text Interpretation:             CRITICAL CARE Performed by: Sunnie Nielsen Total critical care time: 45 Critical care time was exclusive of separately billable procedures and treating other patients. Critical care was necessary to treat or prevent imminent or life-threatening deterioration. Critical care was time spent personally by me on the following activities: development of treatment plan with patient and/or surrogate as well as nursing, discussions with consultants, evaluation of patient's response to treatment, examination of patient, obtaining history from patient or surrogate, ordering and performing treatments and interventions, ordering and review of laboratory studies, ordering and review of radiographic studies, pulse oximetry and re-evaluation of patient's condition. Aggressive IV fluid resuscitation. Tamiflu, IV zofran. PT recently made comfort care DNI/ DNR. Plan admit SDU  MDM   1. Acute febrile illness   2. Nausea vomiting and diarrhea   3. Sepsis   4. Thrombocytopenia    Evaluated with labs and imaging. IVFs and medications provided. Dr Adela Glimpse to admit.l   Admit SDU  Sunnie Nielsen,  MD 10/16/13 2346

## 2013-10-16 NOTE — ED Notes (Signed)
Critical platelet of 13 reported to Dr Dierdre Highman

## 2013-10-16 NOTE — Consult Note (Signed)
[name] is well-known to me. 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  She is now on hospice. We are supporting her. She is no longer a candidate for any intervention for the myeloma or the myelodysplasia.  She came in for earlier this morning with respiratory GI symptoms. She did test positive for influenza B. she is on broad-spectrum antibiotic coverage as well as antiviral coverage.  We did have a long talk with her. Her family was with her. She understands that even with treating the influenza, we still have to deal with her refractory myeloma and high-grade myelodysplasia. Her performance status is ECOG 3 at best. I think it she were to decline and have to be kept alive on a machine that she would not be able to come off a ventilator as she is just too weak. I would hate to see her exist like this.  She has decided that she does not want to be put on life support. I agree with this. As such, she is a DO NOT RESUSCITATE. I reassured her and her family that we would still pursue aggressive supportive measures to get her through this.  She's not eating much. Her weight has been going down.  When she came in, her liver function tests showed bilirubin 6.4. She had an ultrasound done which showed hepatomegaly and splenomegaly. This, it is likely, from her progressive myeloma.   She's not complain of any pain at this point.  She is thrombocytopenic. She will have a transfusion. This I believe would help her.  She has chronic renal insufficiency. Her creatinine is 2.8.  I'll check a prealbumin on her. I have not done this test in a couple years.  She does have some other health issues. She does have some underlying pulmonary disease. She is on inhalers.   Her allergies and medicines are well recorded in the medical record.  On her physical exam, temperatures 100.9. Pulse is 104. Blood pressure is low at  74/39. Her lungs show some scattered wheezes bilaterally. Oral exam is slightly dry. There is no thrush. Cardiac exam is tachycardia regular. Abdomen is soft. Bowel sounds are slightly decreased. Extremities shows chronic changes in her right thigh from past radiation. She has a stable 1+ edema in her lower legs.  At this point, I believe that the next 48 hours will definitely show is how she is going to do. She is on aggressive antibiotic and antiviral coverage. She is Tamiflu. I realize that her immune system is very compromise and that she may not be able to pull through this event.  I very much appreciate the outstanding care that she is getting out of the ICU.  I have known [name] for about 3 or 4 years. She is doing fairly well. He is now point that her immune system a bone marrow just are failing her. Her family definitely understands this well.  We will follow along and help out as much as we can. We need to make sure that we provide supportive care.  I suspect that if she does make this, she'll be in the hospital for a least 2 weeks if not more.  Dario Ave 17:14

## 2013-10-16 NOTE — Progress Notes (Signed)
[facility] MSW note: Pt is a current hospice home care patient that was admitted on 10-10-13. Pt lives with daughter, son in law and 2 young grandsons under the age of 27. Met with son-Geoffrey and his finance. Son reported pt is now a DNR. Pt working on finalizing her will. Son is aware of pt's critical condition. Son coping well. Request hospice chaplain to visit today. MSW provided emotional support and comfort. Discussed case with Marylene Land, staff RN. Please call with concerns or questions.   Elijio Miles, MSW [phone]

## 2013-10-16 NOTE — Progress Notes (Addendum)
Inpatient [facility] Rm 1237  [facility] RN Visit- M. Konrad Dolores, RN  Pt admitted 12/30 with N/V/D acute respiratory failure related to Influenza B Pt currently followed by [facility] with diagnosis of Multiple Myeloma; this is a hospice related admission Per Epic note, after discussions with attending physician Dr Rito Ehrlich and Dr Myna Hidalgo, pt and family have decided on DNR/DNI; on visit son Baxter Flattery and s-i-l Karleen Hampshire at bedside; pt alert oriented x3 tearful, aware she is declining; family asking for Chaplain visit and [facility] Chaplain on way to hospital as well as hospital chaplain aware of request; pt indicated her pain level was 8/10 had just received IV pain medication (at 1345) and was waiting for effect; pt continues to have nausea discussed with staff RN Marylene Land  Patient's home medication list is on shadow chart.   Please call [facility] @ [phone]- ask for RN Liaison or after hours,ask for on-call RN with any hospice needs.   Thank you.  Valente David, RN  [facility]  Hospice Liaison  [phone])

## 2013-10-16 NOTE — H&P (Signed)
PCP:  Lillia Mountain, MD  [facility]   Chief Complaint:  Nausea vomiting diarrhea  HPI: [name] is a 67 y.o. female   has a past medical history of Myeloma; Anxiety; Hypothyroidism; GERD (gastroesophageal reflux disease); Myeloma; OAB (overactive bladder); Osteoporosis; Depression; Chronic pain; and Renal insufficiency.   Presented with  24 hour hx of Nausea, vomiting, and now some diarrhea as well. She has been exposed to daoughter in law that has similar illness. Today she developped fever and cough as well. 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 As well as myelodysplastic disorder currently. Required numerous transfusions. Patient has been actively followed up by Dr. Myna Hidalgo she states she have not seen her PCP for years. Reviewing the notes seems that patient this point has poor prognosis and limited options. Per review which were associated symptoms the patient have had some liver insufficiency going back to September etiology at this point unclear. Family noticed that the past week she has been somewhat jaundiced.  Since her arrival to emerge department patient's oxygenation has dropped and she required high levels of oxygen. She was also noted to be hypotensive down to 90s who patient's places 13 creatinine 2.8 Hospitalist was called on admission   Review of Systems:    abdominal pain, nausea, vomiting, diarrhea,   Pertinent positives include:  Fevers, chills, shortness of breath at rest  productive cough,   Constitutional:  No weight loss, night sweats, fatigue, weight loss  HEENT:  No headaches, Difficulty swallowing,Tooth/dental problems,Sore throat,  No sneezing, itching, ear ache, nasal congestion, post nasal drip,  Cardio-vascular:  No chest pain, Orthopnea, PND, anasarca, dizziness, palpitations.no Bilateral lower extremity swelling  GI:  No heartburn, indigestion,change in bowel  habits, loss of appetite, melena, blood in stool, hematemesis Resp:  no . No dyspnea on exertion, No excess mucus, noNo non-productive cough, No coughing up of blood.No change in color of mucus.No wheezing. Skin:  no rash or lesions. No jaundice GU:  no dysuria, change in color of urine, no urgency or frequency. No straining to urinate.  No flank pain.  Musculoskeletal:  No joint pain or no joint swelling. No decreased range of motion. No back pain.  Psych:  No change in mood or affect. No depression or anxiety. No memory loss.  Neuro: no localizing neurological complaints, no tingling, no weakness, no double vision, no gait abnormality, no slurred speech, no confusion  Otherwise ROS are negative except for above, 10 systems were reviewed  Past Medical History: Past Medical History  Diagnosis Date  . Myeloma   . Anxiety   . Hypothyroidism   . GERD (gastroesophageal reflux disease)   . Myeloma   . OAB (overactive bladder)   . Osteoporosis   . Depression   . Chronic pain   . Renal insufficiency    Past Surgical History  Procedure Laterality Date  . Leg surgery    . Cholecystectomy    . Tonsillectomy    . Breast surgery    . Limbal stem cell transplant    . Abdominal hysterectomy    . Esophagogastroduodenoscopy  06/11/2012    Procedure: ESOPHAGOGASTRODUODENOSCOPY (EGD);  Surgeon: Willis Modena, MD;  Location: [facility];  Service: Endoscopy;  Laterality: N/A;     Medications: Prior to Admission medications   Medication Sig Start Date End Date Taking? Authorizing Provider  acyclovir (ZOVIRAX) 400 MG tablet Take 400 mg by mouth 2 (two) times daily. 10/05/12  Yes Theron Arista  Tacy Dura, MD  ALPRAZolam (XANAX) 0.25 MG tablet Take 1 tablet (0.25 mg total) by mouth 3 (three) times daily as needed for anxiety. 10/10/13  Yes Josph Macho, MD  benzonatate (TESSALON) 100 MG capsule Take 100 mg by mouth as needed. For cough.   Yes Historical Provider, MD  cyclobenzaprine (FLEXERIL) 5  MG tablet Take 1 tablet (5 mg total) by mouth 3 (three) times daily as needed for muscle spasms. 09/28/13  Yes Josph Macho, MD  diphenoxylate-atropine (LOMOTIL) 2.5-0.025 MG per tablet Take 1 tablet by mouth 4 (four) times daily as needed for diarrhea or loose stools. 10/10/13  Yes Josph Macho, MD  dronabinol (MARINOL) 2.5 MG capsule Take 1 capsule (2.5 mg total) by mouth 2 (two) times daily before a meal. 05/31/13  Yes Josph Macho, MD  DULoxetine (CYMBALTA) 60 MG capsule Take 60 mg by mouth daily.   Yes Historical Provider, MD  levothyroxine (SYNTHROID, LEVOTHROID) 50 MCG tablet Take 50 mcg by mouth every morning.    Yes Historical Provider, MD  NEXIUM 40 MG capsule TAKE ONE CAPSULE BY MOUTH EVERY DAY 07/08/13  Yes Josph Macho, MD  ondansetron (ZOFRAN) 8 MG tablet Take 8 mg by mouth every 8 (eight) hours as needed for nausea. For nausea. 03/01/13  Yes Josph Macho, MD  Pancrelipase, Lip-Prot-Amyl, 36000 UNITS CPEP Take 1 capsule by mouth daily.   Yes Historical Provider, MD  temazepam (RESTORIL) 30 MG capsule TAKE 1 CAPSULE AT BEDTIME AS NEEDED FOR SLEEP 09/26/13  Yes Josph Macho, MD  tolterodine (DETROL LA) 4 MG 24 hr capsule Take 4 mg by mouth daily.   Yes Historical Provider, MD  traMADol (ULTRAM) 50 MG tablet Take 50 mg by mouth every 12 (twelve) hours as needed.  07/30/13  Yes Historical Provider, MD  Vitamin D, Ergocalciferol, (DRISDOL) 50000 UNITS CAPS Take 50,000 Units by mouth every Monday.   Yes Historical Provider, MD  Alum & Mag Hydroxide-Simeth (MAGIC MOUTHWASH) SOLN Take 5 mLs by mouth as needed ([facility] formula--no Simethicone. For sores in mouth.).     Historical Provider, MD  fluticasone-salmeterol (ADVAIR HFA) 115-21 MCG/ACT inhaler Inhale 2 puffs into the lungs 2 (two) times daily.    Historical Provider, MD  hydrOXYzine (ATARAX/VISTARIL) 25 MG tablet Take 25 mg by mouth as needed for itching.     Historical Provider, MD  naproxen sodium (ALEVE) 220 MG tablet  Take 440 mg by mouth. For mild pain    Historical Provider, MD  olopatadine (PATANOL) 0.1 % ophthalmic solution Place 1 drop into both eyes 2 (two) times daily as needed (For allergies.).     Historical Provider, MD  PROAIR HFA 108 (90 BASE) MCG/ACT inhaler Inhale 1 puff into the lungs every 6 (six) hours as needed for shortness of breath.  08/17/13   Historical Provider, MD  promethazine (PHENERGAN) 25 MG tablet TAKE 1/2 TABLET AS NEEDED FOR NAUSEA AND VOMITING 05/31/13   Josph Macho, MD  triamcinolone cream (KENALOG) 0.1 % Apply 1 application topically every morning.  07/18/13   Historical Provider, MD    Allergies:   Allergies  Allergen Reactions  . Trazodone And Nefazodone Other (See Comments)    nightmares  . Vicodin [Hydrocodone-Acetaminophen] Itching    Pt tried to take again and had itching.  Marland Kitchen Hydromorphone Itching  . Nefazodone Hives    nightmares  . Tramadol Itching  . Trazodone Hives    nightmares  . Morphine Rash  . Morphine And  Related Rash  . Penicillins Rash    Social History:  Ambulatory wheelchair bound Lives at  Home with family   reports that she has never smoked. She has never used smokeless tobacco. She reports that she does not drink alcohol or use illicit drugs.   Family History: family history includes Lung cancer in her father; Other (age of onset: 42) in her mother.    Physical Exam: Patient Vitals for the past 24 hrs:  BP Temp Temp src Pulse Resp SpO2  10/16/13 0430 90/42 mmHg - - 80 21 91 %  10/16/13 0415 93/38 mmHg - - 82 18 96 %  10/16/13 0400 86/44 mmHg - - 82 20 99 %  10/16/13 0345 87/40 mmHg - - 84 18 97 %  10/16/13 0330 113/63 mmHg - - 67 - 99 %  10/16/13 0325 - - - 91 19 90 %  10/16/13 0316 90/42 mmHg - - 89 17 95 %  10/16/13 0245 82/30 mmHg - - 92 - 95 %  10/16/13 0242 88/33 mmHg 99.3 F (37.4 C) Oral 94 16 94 %  10/16/13 0231 77/39 mmHg 98.9 F (37.2 C) Oral 95 16 84 %  11/08/2013 2331 89/59 mmHg 100 F (37.8 C) Oral 104 17 -     1. General:  in No Acute distress 2. Psychological: Alert and Oriented 3. Head/ENT:    Dry Mucous Membranes                          Head Non traumatic, neck supple                          Normal  Dentition 4. SKIN: normal  Skin turgor,  Skin clean Dry and intact no rash 5. Heart: Regular rate and rhythm no Murmur, Rub or gallop 6. Lungs:  no wheezes occasional crackles   7. Abdomen: Soft, non-tender, slightly distended 8. Lower extremities: no clubbing, cyanosis, or edema 9. Neurologically Grossly intact, moving all 4 extremities equally 10. MSK: Normal range of motion  body mass index is unknown because there is no weight on file.   Labs on Admission:   Recent Labs  10/16/13 0155 10/16/13 0231  NA 134* 135*  K 4.6 4.3  CL 99 100  CO2 18*  --   GLUCOSE 154* 150*  BUN 45* 42*  CREATININE 2.40* 2.80*  CALCIUM 9.5  --     Recent Labs  10/16/13 0155  AST 66*  ALT 76*  ALKPHOS 202*  BILITOT 6.4*  PROT 5.0*  ALBUMIN 2.7*    Recent Labs  10/16/13 0155  LIPASE 23    Recent Labs  10/20/2013 1537 10/16/13 0155 10/16/13 0231  WBC 10.4* 14.4*  --   HGB 8.3* 8.1* 9.2*  HCT 25.6* 24.4* 27.0*  MCV 102.8* 103.0*  --   PLT 17* 13*  --    No results found for this basename: CKTOTAL, CKMB, CKMBINDEX, TROPONINI,  in the last 72 hours No results found for this basename: TSH, T4TOTAL, FREET3, T3FREE, THYROIDAB,  in the last 72 hours No results found for this basename: VITAMINB12, FOLATE, FERRITIN, TIBC, IRON, RETICCTPCT,  in the last 72 hours No results found for this basename: HGBA1C    The CrCl is unknown because both a height and weight (above a minimum accepted value) are required for this calculation. ABG    Component Value Date/Time   TCO2 19 10/16/2013 0231  Lab Results  Component Value Date   DDIMER 5.18* 11/29/2012      Lactic Acid, Venous 1.82  Cultures:    Component Value Date/Time   SDES STOOL 11/29/2012 0906   SPECREQUEST  Immunocompromised 11/29/2012 0906   CULT NO SALMONELLA, SHIGELLA, CAMPYLOBACTER, YERSINIA, OR E.COLI 0157:H7 ISOLATED 11/29/2012 1610   REPTSTATUS 12/03/2012 FINAL 11/29/2012 9604       Radiological Exams on Admission: US Abdomen Complete  10/16/2013   CLINICAL DATA:  Abdominal pain. Nausea, vomiting, and diarrhea. History of multiple myeloma and previous cholecystectomy.  EXAM: ULTRASOUND ABDOMEN COMPLETE  COMPARISON:  CT abdomen and pelvis 02/07/2012  FINDINGS: Gallbladder:  Surgically absent.  Common bile duct:  Diameter: 12 mm, normal for post cholecystectomy patient.  Liver:  Liver appears large with somewhat lobular architecture. Heterogeneous parenchymal echotexture. Small nodular changes suggested. Findings may represent cirrhosis or geographic fatty infiltration. Doppler evaluation of main portal vein shows flow in the appropriate direction.  IVC:  No abnormality visualized.  Pancreas:  Visualized portion unremarkable.  Spleen:  Spleen is enlarged with length measurement of 15.4 cm and volume measurement of 741 ml.  Right Kidney:  Length: 8.4 cm.  Diffuse parenchymal atrophy.  No hydronephrosis.  Left Kidney:  Length: 9.8 cm. Diffuse parenchymal atrophy. Probable small cyst arising from the midpole and measuring about 1 cm diameter. The cyst was also demonstrated on previous CT scan. No hydronephrosis.  Abdominal aorta:  No aneurysm visualized.  Other findings:  None.  IMPRESSION: Surgical absence of the gallbladder with physiologic bile duct prominence. Heterogeneous appearance of the liver with heterogeneous nodular architecture suggesting fatty infiltration or cirrhosis. Splenic enlargement. Bilateral renal atrophy. Small left renal cyst.   Electronically Signed   By: Burman Nieves M.D.   On: 10/16/2013 05:23   Dg Chest Portable 1 View  10/16/2013   CLINICAL DATA:  Nausea, fever.  EXAM: PORTABLE CHEST - 1 VIEW  COMPARISON:  Chest radiograph June 05, 2012  FINDINGS: Cardiomediastinal  silhouette is unremarkable for this low inspiratory portable examination with crowded vasculature markings. The lungs are clear without pleural effusions or focal consolidations. Trachea projects midline and there is no pneumothorax. Multiple remote bilateral rib fractures. Multiple EKG lines overlie the patient and may obscure subtle underlying pathology.  IMPRESSION: No acute cardiopulmonary process.   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 Nausea, vomiting, and diarrhea - most likely secondary to gastroenteritis stool studies ordered  . Multiple myeloma - as per oncology  . Dehydration - IV fluids to be administered being mindful of respiratory status  . Thrombocytopenia - given fever we'll transfuse for for a short 20,000  . Anemia - this point no indication for transfusion patient had required transfusions in the past but her to oncology  . Acute febrile illness - evaluate for influenza. Repeat chest x-ray to evaluate for any evidence of pneumonia or fluid overload since her pulmonary status has declined  . Sepsis - hypertension slightly secondary to dehydration sepsis could not be ruled out we'll watch and step down broad-spectrum antibiotics Liver disease with evidence of cirrhosis ordered the hepatitis serologies. Will need to be further evaluated by GI  Prophylaxis: SCD Protonix  CODE STATUS: Full code by the patient's wishes  Other plan as per orders.  I  have spent a total of on this admission time taken given severity of the patient's disease  Terianna Peggs 10/16/2013, 5:37 AM

## 2013-10-17 ENCOUNTER — Inpatient Hospital Stay (HOSPITAL_COMMUNITY)

## 2013-10-17 DIAGNOSIS — J09X2 Influenza due to identified novel influenza A virus with other respiratory manifestations: Secondary | ICD-10-CM

## 2013-10-17 DIAGNOSIS — C9000 Multiple myeloma not having achieved remission: Secondary | ICD-10-CM

## 2013-10-17 DIAGNOSIS — E860 Dehydration: Secondary | ICD-10-CM

## 2013-10-17 DIAGNOSIS — N189 Chronic kidney disease, unspecified: Secondary | ICD-10-CM

## 2013-10-17 DIAGNOSIS — D631 Anemia in chronic kidney disease: Secondary | ICD-10-CM

## 2013-10-17 LAB — CBC WITH DIFFERENTIAL/PLATELET
Band Neutrophils: 0 % (ref 0–10)
Basophils Absolute: 0.4 10*3/uL — ABNORMAL HIGH (ref 0.0–0.1)
Basophils Relative: 2 % — ABNORMAL HIGH (ref 0–1)
Eosinophils Absolute: 0.4 10*3/uL (ref 0.0–0.7)
Eosinophils Relative: 2 % (ref 0–5)
HCT: 32 % — ABNORMAL LOW (ref 36.0–46.0)
Hemoglobin: 10.6 g/dL — ABNORMAL LOW (ref 12.0–15.0)
MCV: 94.7 fL (ref 78.0–100.0)
Metamyelocytes Relative: 0 %
Monocytes Absolute: 1.3 10*3/uL — ABNORMAL HIGH (ref 0.1–1.0)
Monocytes Relative: 7 % (ref 3–12)
Neutrophils Relative %: 20 % — ABNORMAL LOW (ref 43–77)
RBC: 3.38 MIL/uL — ABNORMAL LOW (ref 3.87–5.11)
RDW: 25 % — ABNORMAL HIGH (ref 11.5–15.5)
WBC: 18.1 10*3/uL — ABNORMAL HIGH (ref 4.0–10.5)

## 2013-10-17 LAB — COMPREHENSIVE METABOLIC PANEL
AST: 56 U/L — ABNORMAL HIGH (ref 0–37)
Alkaline Phosphatase: 149 U/L — ABNORMAL HIGH (ref 39–117)
BUN: 48 mg/dL — ABNORMAL HIGH (ref 6–23)
CO2: 16 mEq/L — ABNORMAL LOW (ref 19–32)
Calcium: 7.8 mg/dL — ABNORMAL LOW (ref 8.4–10.5)
Chloride: 104 mEq/L (ref 96–112)
Creatinine, Ser: 2.63 mg/dL — ABNORMAL HIGH (ref 0.50–1.10)
GFR calc Af Amer: 21 mL/min — ABNORMAL LOW (ref >90)
GFR calc non Af Amer: 18 mL/min — ABNORMAL LOW (ref >90)
Total Bilirubin: 9.5 mg/dL — ABNORMAL HIGH (ref 0.3–1.2)

## 2013-10-17 LAB — CBC
HCT: 30.7 % — ABNORMAL LOW (ref 36.0–46.0)
Hemoglobin: 10.2 g/dL — ABNORMAL LOW (ref 12.0–15.0)
MCHC: 33.2 g/dL (ref 30.0–36.0)
MCV: 94.5 fL (ref 78.0–100.0)
RBC: 3.25 MIL/uL — ABNORMAL LOW (ref 3.87–5.11)

## 2013-10-17 LAB — PREPARE PLATELET PHERESIS: Unit division: 0

## 2013-10-17 LAB — FECAL LACTOFERRIN, QUANT: Fecal Lactoferrin: POSITIVE

## 2013-10-17 LAB — CLOSTRIDIUM DIFFICILE BY PCR: Toxigenic C. Difficile by PCR: NEGATIVE

## 2013-10-17 LAB — PHOSPHORUS: Phosphorus: 4.1 mg/dL (ref 2.3–4.6)

## 2013-10-17 LAB — TSH: TSH: 3.316 u[IU]/mL (ref 0.350–4.500)

## 2013-10-17 MED ORDER — DIPHENHYDRAMINE HCL 50 MG/ML IJ SOLN
12.5000 mg | Freq: Four times a day (QID) | INTRAMUSCULAR | Status: DC | PRN
  Administered 2013-10-17 – 2013-10-21 (×4): 12.5 mg via INTRAVENOUS
  Filled 2013-10-17 (×4): qty 1

## 2013-10-17 MED ORDER — MORPHINE SULFATE 2 MG/ML IJ SOLN
1.0000 mg | INTRAMUSCULAR | Status: AC
  Administered 2013-10-17: 1 mg via INTRAVENOUS
  Filled 2013-10-17: qty 1

## 2013-10-17 MED ORDER — IPRATROPIUM-ALBUTEROL 0.5-2.5 (3) MG/3ML IN SOLN
3.0000 mL | Freq: Four times a day (QID) | RESPIRATORY_TRACT | Status: DC
  Administered 2013-10-17: 3 mL via RESPIRATORY_TRACT
  Filled 2013-10-17: qty 3

## 2013-10-17 MED ORDER — LEVALBUTEROL HCL 0.63 MG/3ML IN NEBU
0.6300 mg | INHALATION_SOLUTION | Freq: Four times a day (QID) | RESPIRATORY_TRACT | Status: DC | PRN
  Administered 2013-10-17: 0.63 mg via RESPIRATORY_TRACT
  Filled 2013-10-17 (×2): qty 3

## 2013-10-17 MED ORDER — DIPHENHYDRAMINE HCL 25 MG PO CAPS
25.0000 mg | ORAL_CAPSULE | Freq: Four times a day (QID) | ORAL | Status: DC | PRN
  Administered 2013-10-17: 25 mg via ORAL
  Filled 2013-10-17: qty 1

## 2013-10-17 MED ORDER — IPRATROPIUM-ALBUTEROL 0.5-2.5 (3) MG/3ML IN SOLN
3.0000 mL | Freq: Two times a day (BID) | RESPIRATORY_TRACT | Status: DC
  Administered 2013-10-17 – 2013-10-18 (×2): 3 mL via RESPIRATORY_TRACT
  Filled 2013-10-17 (×17): qty 3

## 2013-10-17 NOTE — Progress Notes (Signed)
Received another order for PICC insertion.   IV Team unable to insert PICCs due to occlusions.  Spoke with primary RN and informed her IR would need to insert the PICC due to occlusions.   I contacted Interventional Radiology and spoke with Tiffany.  Gave her information regarding referring PICC to them.  I then contacted the primary RN and made her aware that order needed to be entered for IR and cancelled for IV Team.

## 2013-10-17 NOTE — Progress Notes (Signed)
[facility] PROGRESS NOTE  [name] ZOX:[id number] DOB: 02-10-1946 DOA: Oct 21, 2013 PCP: Lillia Mountain, MD  Family doesn't consider Dr. Valentina Lucks their PCP.  They consider Dr. Myna Hidalgo the PCP  67 yo female with h/o myelodysplasia who was placed on hospice care 12/24.  Presented with N/V/D, hypotensive and hypoxic.  Time spent: 25 minutes   Assessment/Plan:  Have asked palliative care for goals of care meeting. Patient is to be on hospice,? Need for aggressive intervention including PICC line and transfusions?  Acute hypoxic respiratory failure secondary to influenza B  tami flu started Supportive care.   Family is aware patient's prognosis is poor More stable today. Oncology has placed PICC line and ordered transfusion. Other antibiotics discontinued as patient has no bacterial infection, only influenza. In discussion with patient, morphine causes itching. We'll try IV morphine to see this is his work of breathing. If she tolerates it, could look very low dose morphine drip. Will also add breathing treatments  Acute renal failure Secondary to significant diarrhea Likely Influenza IV fluids and monitor.   Hypotension SBP is the 70s.   Received 3 1L bolus of NS. Again - family teetering between full comfort and more aggressive care such as pressors.  Elevated transaminases. 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 Dr. Myna Hidalgo visited the patient this morning. She was referred to hospice in December as Onc did not feel she was a candidate for any systemic therapy.  Chest pressure Likely angina due to demand. Troponins thus far are negative.      DVT Prophylaxis:  SCD  Code Status: DNR/DNI Family Communication: Patient's sister at the bedside Disposition Plan: Based on goals of care meeting  Consultants:  Oncology  Palliative care  Procedures:  PICC line placed 12/31  Antibiotics:  Vanc,  Cefepime, Azith-discontinued after 12/30  HPI/Subjective: Patient feeling better. Breathing still labored. No pain.  Objective: Filed Vitals:   10/17/13 0659 10/17/13 0934 10/17/13 1140 10/17/13 1553  BP: 89/52  95/55 113/73  Pulse: 78  88 93  Temp: 97.8 F (36.6 C)  97.8 F (36.6 C) 97.9 F (36.6 C)  TempSrc: Oral  Oral Oral  Resp: 18  18 18   Height:      Weight:      SpO2: 95% 92% 93% 93%    Intake/Output Summary (Last 24 hours) at 10/17/13 1905 Last data filed at 10/17/13 0945  Gross per 24 hour  Intake    324 ml  Output    650 ml  Net   -326 ml   Filed Weights   10/16/13 0635  Weight: 48.988 kg (108 lb)    Exam: General: Awake, mild distress secondary to respiratory deficiency, pale, otherwise comfortable HEENT:  PERR, EOMI, mild icteric Sclera, MMM. Neck: Supple, no JVD, no masses  Cardiovascular: RRR, S1 S2 Respiratory: Clear to auscultation bilaterally with equal chest rise  Abdomen: Soft, obese, nontender, nondistended, + bowel sounds  Extremities: warm dry without cyanosis clubbing or edema.  Skin: Without rashes exudates or nodules.  Pale.       Data Reviewed: Basic Metabolic Panel:  Recent Labs Lab 10/16/13 0155 10/16/13 0231 10/16/13 1310 10/17/13 1146  NA 134* 135* 134* 136*  K 4.6 4.3 3.4* 3.5*  CL 99 100 99 104  CO2 18*  --  15* 16*  GLUCOSE 154* 150* 134* 90  BUN 45* 42* 45* 48*  CREATININE 2.40* 2.80* 2.43* 2.63*  CALCIUM 9.5  --  7.8* 7.8*  MG  --   --   --  1.6  PHOS  --   --   --  4.1   Liver Function Tests:  Recent Labs Lab 10/16/13 0155 10/16/13 1310 10/17/13 1146  AST 66* 65* 56*  ALT 76* 66* 63*  ALKPHOS 202* 162* 149*  BILITOT 6.4* 6.1* 9.5*  PROT 5.0* 4.8* 4.7*  ALBUMIN 2.7* 2.5* 2.4*    Recent Labs Lab 10/16/13 0155  LIPASE 23   CBC:  Recent Labs Lab 09/27/2013 1537 10/16/13 0155 10/16/13 0231 10/16/13 1310 10/17/13 1146 10/17/13 1705  WBC 10.4* 14.4*  --  15.2* 12.9* 18.1*  NEUTROABS  --   --    --   --   --  3.6  HGB 8.3* 8.1* 9.2* 7.1* 10.2* 10.6*  HCT 25.6* 24.4* 27.0* 22.0* 30.7* 32.0*  MCV 102.8* 103.0*  --  105.8* 94.5 94.7  PLT 17* 13*  --  60* 24* 32*   Cardiac Enzymes:  Recent Labs Lab 10/16/13 0740  TROPONINI <0.30     Recent Results (from the past 240 hour(s))  MRSA PCR SCREENING     Status: None   Collection Time    10/16/13  7:26 AM      Result Value Range Status   MRSA by PCR NEGATIVE  NEGATIVE Final   Comment:            The GeneXpert MRSA Assay (FDA     approved for NASAL specimens     only), is one component of a     comprehensive MRSA colonization     surveillance program. It is not     intended to diagnose MRSA     infection nor to guide or     monitor treatment for     MRSA infections.  CLOSTRIDIUM DIFFICILE BY PCR     Status: None   Collection Time    10/16/13  7:36 PM      Result Value Range Status   C difficile by pcr NEGATIVE  NEGATIVE Final   Comment: Performed at [facility]     Studies: Dg Chest 2 View  10/16/2013   CLINICAL DATA:  Cough, shortness of breath, chest discomfort. History of multiple myeloma.  EXAM: CHEST  2 VIEW  COMPARISON:  10/16/2013  FINDINGS: Shallow inspiration with elevation of right hemidiaphragm. Normal heart size and pulmonary vascularity. No focal airspace disease in the lungs. Multiple old, acute, and healing rib fractures bilaterally compatible with history of myeloma. No definite evidence of any new fractures since previous study. Degenerative changes in the spine.  IMPRESSION: No evidence of active pulmonary disease. Multiple bilateral rib fractures of varying ages.   Electronically Signed   By: Burman Nieves M.D.   On: 10/16/2013 06:39   US Abdomen Complete  10/16/2013   CLINICAL DATA:  Abdominal pain. Nausea, vomiting, and diarrhea. History of multiple myeloma and previous cholecystectomy.  EXAM: ULTRASOUND ABDOMEN COMPLETE  COMPARISON:  CT abdomen and pelvis 02/07/2012  FINDINGS: Gallbladder:   Surgically absent.  Common bile duct:  Diameter: 12 mm, normal for post cholecystectomy patient.  Liver:  Liver appears large with somewhat lobular architecture. Heterogeneous parenchymal echotexture. Small nodular changes suggested. Findings may represent cirrhosis or geographic fatty infiltration. Doppler evaluation of main portal vein shows flow in the appropriate direction.  IVC:  No abnormality visualized.  Pancreas:  Visualized portion unremarkable.  Spleen:  Spleen is enlarged with length measurement of 15.4 cm and volume measurement of 741 ml.  Right Kidney:  Length: 8.4 cm.  Diffuse parenchymal  atrophy.  No hydronephrosis.  Left Kidney:  Length: 9.8 cm. Diffuse parenchymal atrophy. Probable small cyst arising from the midpole and measuring about 1 cm diameter. The cyst was also demonstrated on previous CT scan. No hydronephrosis.  Abdominal aorta:  No aneurysm visualized.  Other findings:  None.  IMPRESSION: Surgical absence of the gallbladder with physiologic bile duct prominence. Heterogeneous appearance of the liver with heterogeneous nodular architecture suggesting fatty infiltration or cirrhosis. Splenic enlargement. Bilateral renal atrophy. Small left renal cyst.   Electronically Signed   By: Burman Nieves M.D.   On: 10/16/2013 05:23   Ir Fluoro Guide Cv Line Right  10/17/2013   CLINICAL DATA:  67 year old female with influenza and needs IV access. Patient has known lower SVC occlusion.  EXAM: RIGHT PICC LINE PLACEMENT WITH ULTRASOUND AND FLUOROSCOPIC GUIDANCE  FLUOROSCOPY TIME:  54 seconds  PROCEDURE: The patient was advised of the possible risks and complications and agreed to undergo the procedure. The patient was then brought to the angiographic suite for the procedure.  The right arm was prepped with chlorhexidine, draped in the usual sterile fashion using maximum barrier technique (cap and mask, sterile gown, sterile gloves, large sterile sheet, hand hygiene and cutaneous antisepsis) and  infiltrated locally with 1% Lidocaine.  Ultrasound demonstrated patency of the right brachial vein, and this was documented with an image. Under real-time ultrasound guidance, this vein was accessed with a 21 gauge micropuncture needle and image documentation was performed. A 0.018 wire was introduced in to the vein. A peel-away sheath was placed. A 5 French dual lumen power injectable PICC was advanced to the right subclavian vein. Fluoroscopy during the procedure and fluoro spot radiograph confirms appropriate catheter position. The catheter was flushed and covered with a sterile dressing.  Complications: None  IMPRESSION: Successful right arm power injectable PICC line placement with ultrasound and fluoroscopic guidance. The catheter is ready for use. Catheter tip in the right subclavian vein due to SVC occlusion.   Electronically Signed   By: Richarda Overlie M.D.   On: 10/17/2013 13:17   Ir US Guide Vasc Access Right  10/17/2013   CLINICAL DATA:  67 year old female with influenza and needs IV access. Patient has known lower SVC occlusion.  EXAM: RIGHT PICC LINE PLACEMENT WITH ULTRASOUND AND FLUOROSCOPIC GUIDANCE  FLUOROSCOPY TIME:  54 seconds  PROCEDURE: The patient was advised of the possible risks and complications and agreed to undergo the procedure. The patient was then brought to the angiographic suite for the procedure.  The right arm was prepped with chlorhexidine, draped in the usual sterile fashion using maximum barrier technique (cap and mask, sterile gown, sterile gloves, large sterile sheet, hand hygiene and cutaneous antisepsis) and infiltrated locally with 1% Lidocaine.  Ultrasound demonstrated patency of the right brachial vein, and this was documented with an image. Under real-time ultrasound guidance, this vein was accessed with a 21 gauge micropuncture needle and image documentation was performed. A 0.018 wire was introduced in to the vein. A peel-away sheath was placed. A 5 French dual lumen  power injectable PICC was advanced to the right subclavian vein. Fluoroscopy during the procedure and fluoro spot radiograph confirms appropriate catheter position. The catheter was flushed and covered with a sterile dressing.  Complications: None  IMPRESSION: Successful right arm power injectable PICC line placement with ultrasound and fluoroscopic guidance. The catheter is ready for use. Catheter tip in the right subclavian vein due to SVC occlusion.   Electronically Signed   By: Madelaine Bhat  Lowella Dandy M.D.   On: 10/17/2013 13:17   Dg Chest Portable 1 View  10/16/2013   CLINICAL DATA:  Nausea, fever.  EXAM: PORTABLE CHEST - 1 VIEW  COMPARISON:  Chest radiograph June 05, 2012  FINDINGS: Cardiomediastinal silhouette is unremarkable for this low inspiratory portable examination with crowded vasculature markings. The lungs are clear without pleural effusions or focal consolidations. Trachea projects midline and there is no pneumothorax. Multiple remote bilateral rib fractures. Multiple EKG lines overlie the patient and may obscure subtle underlying pathology.  IMPRESSION: No acute cardiopulmonary process.   Electronically Signed   By: Awilda Metro   On: 10/16/2013 04:26    Scheduled Meds: . sodium chloride  500 mL Intravenous Once  . acyclovir  400 mg Oral BID  . antiseptic oral rinse  15 mL Mouth Rinse BID  . guaiFENesin  600 mg Oral BID  . ipratropium-albuterol  3 mL Nebulization BID  . levothyroxine  50 mcg Oral QAC breakfast  . mometasone-formoterol  2 puff Inhalation BID  . oseltamivir  30 mg Oral Daily  . pantoprazole  40 mg Oral Daily  . sodium chloride  3 mL Intravenous Q12H   Continuous Infusions: . sodium chloride 100 mL/hr (10/16/13 1818)    Active Problems:   Multiple myeloma   Dehydration   Nausea, vomiting, and diarrhea   Thrombocytopenia   Anemia   Acute febrile illness   Sepsis    Ernestina Patches  [facility] Pager [phone] If 7PM-7AM, please contact  night-coverage at [email or web address], password [email or web address] 10/17/2013, 7:05 PM  LOS: 2 days

## 2013-10-17 NOTE — Progress Notes (Signed)
[name] is is looking better this morning. She is still receiving a blood transfusion. She did get platelets. She continues on Tamiflu for influenza B. She does sound very congested. A chest x-ray yesterday did not show any active disease.  I really think that a PICC line will help her out. She needs a lot of IVs right now and just does not have an access.  Her liver tests are about the same. Her kidney function is also stable. Is possible that the elevated liver test to reflect his viral syndrome.  She's had no obvious bleeding.  Her appetite is not that could. Hopefully she will be a little more today. Blood pressure is better although still on the low side.  Her temperature is 97.8. Pulse is 78. Blood pressure 89/52. Oxygen saturation is 95%. Her lungs sound good. She good breath sounds bilaterally. Cardiac exam is regular rate and rhythm. There are no murmurs. Abdomen is soft. She has no obvious hepatosplenomegaly. Extremities shows no edema.  There are no labs back yet.  We will continue on the Tamiflu and IV antibiotics.  She may benefit from PT when she gets stronger.  Pete E.  Hebrews 12:12

## 2013-10-17 NOTE — Procedures (Signed)
Right midline PICC placed due to known SVC occlusion.  PICC tip in right subclavian vein and ready to use.

## 2013-10-17 NOTE — Progress Notes (Signed)
Room [facility]20-[facility] RN Visit-R.Adriella Essex RN  Related admission to [facility] diagnosis of multiple myeloma.   Pt is DNR code.    Pt asleep at beginning of visit and then awoke, alert & oriented, lying upright in bed, without complaints of pain or discomfort.   Son and dtr present. Patient's home medication list is on shadow chart.   Pt receiving unit of PRBC - just completing infusion.  Pt on droplet and contact precautions.  Dr. Myna Hidalgo ordered PICC line.  No family needs at this time.   Please call [facility] @ [phone]- ask for RN Liaison or after hours,ask for on-call RN with any hospice needs.   Thank you.  Joneen Boers, RN  [facility]  Hospice Liaison  [phone])

## 2013-10-18 ENCOUNTER — Inpatient Hospital Stay (HOSPITAL_COMMUNITY)

## 2013-10-18 DIAGNOSIS — R945 Abnormal results of liver function studies: Secondary | ICD-10-CM

## 2013-10-18 DIAGNOSIS — N179 Acute kidney failure, unspecified: Secondary | ICD-10-CM | POA: Diagnosis present

## 2013-10-18 DIAGNOSIS — Z515 Encounter for palliative care: Secondary | ICD-10-CM

## 2013-10-18 LAB — CBC WITH DIFFERENTIAL/PLATELET
Band Neutrophils: 0 % (ref 0–10)
Basophils Absolute: 0 10*3/uL (ref 0.0–0.1)
Basophils Relative: 0 % (ref 0–1)
Blasts: 0 %
Eosinophils Absolute: 0 10*3/uL (ref 0.0–0.7)
Eosinophils Relative: 0 % (ref 0–5)
HCT: 31.3 % — ABNORMAL LOW (ref 36.0–46.0)
Hemoglobin: 10.3 g/dL — ABNORMAL LOW (ref 12.0–15.0)
Lymphocytes Relative: 66 % — ABNORMAL HIGH (ref 12–46)
Lymphs Abs: 9.9 10*3/uL — ABNORMAL HIGH (ref 0.7–4.0)
MCH: 31.3 pg (ref 26.0–34.0)
MCHC: 32.9 g/dL (ref 30.0–36.0)
MCV: 95.1 fL (ref 78.0–100.0)
Metamyelocytes Relative: 0 %
Monocytes Absolute: 1.5 10*3/uL — ABNORMAL HIGH (ref 0.1–1.0)
Monocytes Relative: 10 % (ref 3–12)
Myelocytes: 1 %
Neutro Abs: 3.6 10*3/uL (ref 1.7–7.7)
Neutrophils Relative %: 23 % — ABNORMAL LOW (ref 43–77)
Platelets: 20 10*3/uL — CL (ref 150–400)
Promyelocytes Absolute: 0 %
RBC: 3.29 MIL/uL — ABNORMAL LOW (ref 3.87–5.11)
RDW: 25.4 % — ABNORMAL HIGH (ref 11.5–15.5)
WBC: 15 10*3/uL — ABNORMAL HIGH (ref 4.0–10.5)
nRBC: 10 /100 WBC — ABNORMAL HIGH

## 2013-10-18 LAB — GLUCOSE, CAPILLARY: GLUCOSE-CAPILLARY: 74 mg/dL (ref 70–99)

## 2013-10-18 LAB — COMPREHENSIVE METABOLIC PANEL
ALT: 70 U/L — ABNORMAL HIGH (ref 0–35)
AST: 70 U/L — ABNORMAL HIGH (ref 0–37)
Albumin: 2.4 g/dL — ABNORMAL LOW (ref 3.5–5.2)
Alkaline Phosphatase: 172 U/L — ABNORMAL HIGH (ref 39–117)
BUN: 49 mg/dL — ABNORMAL HIGH (ref 6–23)
CO2: 14 mEq/L — ABNORMAL LOW (ref 19–32)
Calcium: 7.5 mg/dL — ABNORMAL LOW (ref 8.4–10.5)
Chloride: 107 mEq/L (ref 96–112)
Creatinine, Ser: 2.6 mg/dL — ABNORMAL HIGH (ref 0.50–1.10)
GFR calc Af Amer: 21 mL/min — ABNORMAL LOW (ref >90)
GFR calc non Af Amer: 18 mL/min — ABNORMAL LOW (ref >90)
Glucose, Bld: 78 mg/dL (ref 70–99)
Potassium: 3.7 mEq/L (ref 3.7–5.3)
Sodium: 138 mEq/L (ref 137–147)
Total Bilirubin: 11.6 mg/dL — ABNORMAL HIGH (ref 0.3–1.2)
Total Protein: 4.8 g/dL — ABNORMAL LOW (ref 6.0–8.3)

## 2013-10-18 LAB — PROCALCITONIN: Procalcitonin: 61.58 ng/mL

## 2013-10-18 MED ORDER — LEVOFLOXACIN IN D5W 500 MG/100ML IV SOLN
500.0000 mg | INTRAVENOUS | Status: DC
Start: 2013-10-20 — End: 2013-10-22
  Administered 2013-10-20: 500 mg via INTRAVENOUS
  Filled 2013-10-18: qty 100

## 2013-10-18 MED ORDER — HYDROMORPHONE HCL PF 1 MG/ML IJ SOLN
0.5000 mg | INTRAMUSCULAR | Status: DC | PRN
  Administered 2013-10-18: 0.5 mg via INTRAVENOUS
  Filled 2013-10-18: qty 1

## 2013-10-18 MED ORDER — LIDOCAINE 5 % EX PTCH
1.0000 | MEDICATED_PATCH | CUTANEOUS | Status: DC
  Administered 2013-10-18 – 2013-10-19 (×2): 1 via TRANSDERMAL
  Filled 2013-10-18 (×3): qty 1

## 2013-10-18 MED ORDER — BIOTENE DRY MOUTH MT LIQD
15.0000 mL | Freq: Every day | OROMUCOSAL | Status: DC
  Administered 2013-10-18 – 2013-10-24 (×21): 15 mL via OROMUCOSAL

## 2013-10-18 MED ORDER — ACETAMINOPHEN 325 MG PO TABS
650.0000 mg | ORAL_TABLET | Freq: Three times a day (TID) | ORAL | Status: DC
  Administered 2013-10-18 – 2013-10-20 (×7): 650 mg via ORAL
  Filled 2013-10-18 (×12): qty 2

## 2013-10-18 MED ORDER — ALPRAZOLAM 0.5 MG PO TABS
0.5000 mg | ORAL_TABLET | Freq: Three times a day (TID) | ORAL | Status: DC | PRN
  Administered 2013-10-19 – 2013-10-21 (×3): 0.5 mg via ORAL
  Filled 2013-10-18 (×4): qty 1

## 2013-10-18 MED ORDER — ALPRAZOLAM 1 MG PO TABS
1.0000 mg | ORAL_TABLET | Freq: Once | ORAL | Status: AC
  Administered 2013-10-18: 1 mg via ORAL
  Filled 2013-10-18: qty 1

## 2013-10-18 MED ORDER — LEVALBUTEROL HCL 0.63 MG/3ML IN NEBU
0.6300 mg | INHALATION_SOLUTION | Freq: Four times a day (QID) | RESPIRATORY_TRACT | Status: DC
Start: 2013-10-18 — End: 2013-10-19
  Administered 2013-10-18 – 2013-10-19 (×3): 0.63 mg via RESPIRATORY_TRACT
  Filled 2013-10-18 (×7): qty 3

## 2013-10-18 MED ORDER — LEVOFLOXACIN IN D5W 750 MG/150ML IV SOLN
750.0000 mg | Freq: Once | INTRAVENOUS | Status: AC
  Administered 2013-10-18: 750 mg via INTRAVENOUS
  Filled 2013-10-18: qty 150

## 2013-10-18 MED ORDER — ALPRAZOLAM 1 MG PO TABS: 1.0000 mg | ORAL_TABLET | Freq: Every evening | ORAL | Status: DC | PRN

## 2013-10-18 MED ORDER — IPRATROPIUM BROMIDE 0.02 % IN SOLN
0.5000 mg | Freq: Four times a day (QID) | RESPIRATORY_TRACT | Status: DC
  Administered 2013-10-18 – 2013-10-19 (×3): 0.5 mg via RESPIRATORY_TRACT
  Filled 2013-10-18 (×3): qty 2.5

## 2013-10-18 MED ORDER — LOPERAMIDE HCL 2 MG PO CAPS
2.0000 mg | ORAL_CAPSULE | Freq: Three times a day (TID) | ORAL | Status: AC
  Administered 2013-10-18 (×3): 2 mg via ORAL
  Filled 2013-10-18 (×3): qty 1

## 2013-10-18 MED ORDER — ACYCLOVIR 400 MG PO TABS
200.0000 mg | ORAL_TABLET | Freq: Two times a day (BID) | ORAL | Status: DC
  Administered 2013-10-18 – 2013-10-19 (×4): 200 mg via ORAL
  Filled 2013-10-18 (×6): qty 0.5

## 2013-10-18 MED ORDER — SODIUM CHLORIDE 0.9 % IJ SOLN
10.0000 mL | INTRAMUSCULAR | Status: DC | PRN
  Administered 2013-10-18: 10 mL
  Administered 2013-10-19: 20 mL
  Administered 2013-10-21 – 2013-10-24 (×4): 10 mL

## 2013-10-18 MED ORDER — LOPERAMIDE HCL 2 MG PO CAPS
2.0000 mg | ORAL_CAPSULE | ORAL | Status: DC | PRN
  Administered 2013-10-19 – 2013-10-20 (×2): 2 mg via ORAL
  Filled 2013-10-18 (×2): qty 1

## 2013-10-18 MED ORDER — CAMPHOR-MENTHOL 0.5-0.5 % EX LOTN
TOPICAL_LOTION | CUTANEOUS | Status: DC | PRN
  Filled 2013-10-18: qty 222

## 2013-10-18 MED ORDER — ONDANSETRON HCL 4 MG/2ML IJ SOLN
4.0000 mg | Freq: Three times a day (TID) | INTRAMUSCULAR | Status: DC
  Administered 2013-10-18: 4 mg via INTRAVENOUS
  Filled 2013-10-18: qty 2

## 2013-10-18 MED ORDER — FENTANYL CITRATE 0.05 MG/ML IJ SOLN
25.0000 ug | INTRAMUSCULAR | Status: DC | PRN
  Administered 2013-10-19: 25 ug via INTRAVENOUS
  Filled 2013-10-18: qty 2

## 2013-10-18 NOTE — Progress Notes (Signed)
Mrs. Bebee seems to be a little bit better. Her mouth is awful dry.  She did have a PICC line placed. This really has helped. We can use this as an outpatient.  Again, I think the PICC line will be very helpful as an outpatient. We are still supporting her as necessary with transfusions until transfusions stop working.  Hospice is following her.  She does not feel short of breath. She is on nebulizers. Rest of her cultures are negative.  Her liver test continue to elevate. This I think is going to be a problem. She did have a liver ultrasound. This did not show any obvious mass. 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  Her renal function is about the same.  Her white cell count is 15. Platelet count is down to 20.    Her vital signs are okay. Blood pressure is a little better at 100/58. Her lungs sound pretty good. She has decent air movement bilaterally. Cardiac exam is regular rate and rhythm. Abdomen is soft. Liver edge might be palpable at the right costal margin. There is no fluid wave. Extremities shows no clubbing cyanosis or edema.  At this point, I think that her liver function might be the primary factor in her prognosis. It is certainly possible that she may not new hospital if her liver tests continue to elevate.  I think that the next 3 or 4 days will be critical.  I will try to speak with her daughter today.  I very much appreciate the great care that she is receiving.  Pete E.  Psalm 27:1

## 2013-10-18 NOTE — Progress Notes (Signed)
ANTIBIOTIC CONSULT NOTE - INITIAL  Pharmacy Consult for Levaquin Indication: pneumonia  Allergies  Allergen Reactions  . Trazodone And Nefazodone Other (See Comments)    nightmares  . Vicodin [Hydrocodone-Acetaminophen] Itching    Pt tried to take again and had itching.  Marland Kitchen Hydromorphone Itching  . Nefazodone Hives    nightmares  . Tramadol Itching  . Trazodone Hives    nightmares  . Morphine Rash  . Morphine And Related Rash  . Penicillins Rash    Patient Measurements: Height: 4\' 10"  (147.3 cm) Weight: 108 lb (48.988 kg) IBW/kg (Calculated) : 40.9  Vital Signs: Temp: 97.7 F (36.5 C) (01/01 1513) Temp src: Oral (01/01 1513) BP: 119/64 mmHg (01/01 1513) Pulse Rate: 102 (01/01 1513) Intake/Output from previous day: 12/31 0701 - 01/01 0700 In: 3526.7 [P.O.:260; I.V.:3266.7] Out: 800 [Urine:800] Intake/Output from this shift:    Labs:  Recent Labs  10/16/13 0155  10/16/13 0930 10/16/13 1310 10/17/13 1146 10/17/13 1705 10/18/13 0620  WBC 14.4*  --   --  15.2* 12.9* 18.1* 15.0*  HGB 8.1*  < >  --  7.1* 10.2* 10.6* 10.3*  PLT 13*  --   --  60* 24* 32* 20*  LABCREA  --   --  34.1  --   --   --   --   CREATININE 2.40*  < >  --  2.43* 2.63*  --  2.60*  < > = values in this interval not displayed. Estimated Creatinine Clearance: 13.7 ml/min (by C-G formula based on Cr of 2.6). No results found for this basename: VANCOTROUGH, Corlis Leak, VANCORANDOM, GENTTROUGH, GENTPEAK, GENTRANDOM, TOBRATROUGH, TOBRAPEAK, TOBRARND, AMIKACINPEAK, AMIKACINTROU, AMIKACIN,  in the last 72 hours   Microbiology: Recent Results (from the past 720 hour(s))  MRSA PCR SCREENING     Status: None   Collection Time    10/16/13  7:26 AM      Result Value Range Status   MRSA by PCR NEGATIVE  NEGATIVE Final   Comment:            The GeneXpert MRSA Assay (FDA     approved for NASAL specimens     only), is one component of a     comprehensive MRSA colonization     surveillance program. It is  not     intended to diagnose MRSA     infection nor to guide or     monitor treatment for     MRSA infections.  CLOSTRIDIUM DIFFICILE BY PCR     Status: None   Collection Time    10/16/13  7:36 PM      Result Value Range Status   C difficile by pcr NEGATIVE  NEGATIVE Final   Comment: Performed at [facility] History: Past Medical History  Diagnosis Date  . Myeloma   . Anxiety   . Hypothyroidism   . GERD (gastroesophageal reflux disease)   . Myeloma   . OAB (overactive bladder)   . Osteoporosis   . Depression   . Chronic pain   . Renal insufficiency     Medications:  Scheduled:  . sodium chloride  500 mL Intravenous Once  . acetaminophen  650 mg Oral TID  . acyclovir  200 mg Oral BID  . antiseptic oral rinse  15 mL Mouth Rinse 6 X Daily  . guaiFENesin  600 mg Oral BID  . ipratropium  0.5 mg Nebulization Q6H  . levalbuterol  0.63 mg Nebulization Q6H  .  levothyroxine  50 mcg Oral QAC breakfast  . lidocaine  1 patch Transdermal Q24H  . loperamide  2 mg Oral TID  . mometasone-formoterol  2 puff Inhalation BID  . ondansetron (ZOFRAN) IV  4 mg Intravenous Q8H  . oseltamivir  30 mg Oral Daily  . pantoprazole  40 mg Oral Daily  . sodium chloride  3 mL Intravenous Q12H   Infusions:  . sodium chloride 75 mL/hr at 10/18/13 1822   Assessment: 68 yo woman with end stage MM and currently enrolled in hospice home care per palliative Md now at increased risk of secondary bacterial PNA and family would desire aggressive IV abxs at this time. Levaquin per pharmacy ordered. Afebrile. Renal dysfunction - CrCl ~ 14 ml/min. WBC elevated but improved some.  Goal of Therapy:  adjustment per renal function  Plan:  1) Levaquin 750mg  IV x 1 then 500mg  IV q48 2) Monitor renal function   Adrian Saran, PharmD, BCPS Pager [phone] 10/18/2013 7:35 PM'

## 2013-10-18 NOTE — Progress Notes (Signed)
VASCULAR LAB PRELIMINARY  PRELIMINARY  PRELIMINARY  PRELIMINARY  Right upper extremity venous Doppler completed.    Preliminary report:  There is no obvious evidence of DVT or SVT noted in the visualized veins of the right upper extremity.  Difficult study secondary to bandages and edema.  Marlyn Rabine, RVT 10/18/2013, 1:18 PM

## 2013-10-18 NOTE — Progress Notes (Addendum)
[facility] PROGRESS NOTE  [name] JKK:[id number] DOB: 02/18/1946 DOA: 09/23/2013 PCP: Irven Shelling, MD  Family doesn't consider Dr. Laurann Montana their PCP.  They consider Dr. Marin Olp the PCP  68 yo female with h/o myelodysplasia who was placed on hospice care 12/24.  Presented with N/V/D, hypotensive and hypoxic.  Time spent: 25 minutes   Assessment/Plan:  Have asked palliative care for goals of care meeting. Patient is to be on hospice,? Need for aggressive intervention including PICC line and transfusions?  Bruising of right arm: Concerns for clot. Ultrasound fortunately noted no evidence of DVT. Continue IV fluids  Sepsis: Noted elevated pro calcitonin levels. Not sure if patient will survive this hospitalization. Really need to focus on comfort and quality.  Acute hypoxic respiratory failure secondary to influenza B  tami flu started Supportive care.   Family is aware patient's prognosis is poor More stable today. Oncology has placed PICC line and ordered transfusion. Other antibiotics discontinued as patient has no bacterial infection, only influenza. In discussion with patient, morphine causes itching.  Tried IV morphine, but this caused significant itching  Acute renal failure in the setting of chronic renal disease Secondary to significant diarrhea Likely Influenza IV fluids and monitor. Still elevated though. Will try to increase ivf  Hypotension Systolic BP better Received 3 1L bolus of NS.  Elevated transaminases. 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 Dr. Marin Olp visited the patient this morning. She was referred to hospice in December as Onc did not feel she was a candidate for any systemic therapy.  Chest pressure Likely angina due to demand. Troponins thus far are negative.  DVT Prophylaxis:  SCD  Code Status: DNR/DNI Family Communication: Patient's family home with flu Disposition Plan:  Based on goals of care meeting  Consultants:  Oncology  Palliative care  Procedures:  PICC line placed 12/31  Antibiotics:  Vanc, Cefepime, Azith-discontinued after 12/30  Tamiflu 12/29-present.  HPI/Subjective: Patient feeling better. Breathing still labored. No pain.  Objective: Filed Vitals:   10/17/13 2156 10/18/13 0603 10/18/13 0848 10/18/13 1513  BP: 110/68 100/58  119/64  Pulse: 106 90  102  Temp: 97.8 F (36.6 C) 97.7 F (36.5 C)  97.7 F (36.5 C)  TempSrc: Oral Oral  Oral  Resp: _0 Height:      Weight:      SpO2: 90% 95% 93% 90%    Intake/Output Summary (Last 24 hours) at 10/18/13 1627 Last data filed at 10/18/13 0640  Gross per 24 hour  Intake 3526.66 ml  Output    350 ml  Net 3176.66 ml   Filed Weights   10/16/13 0635  Weight: 48.988 kg (108 lb)    Exam: General: Awake, mild distress secondary to respiratory deficiency, pale, otherwise comfortable HEENT:  PERR, EOMI, mild icteric Sclera, MMM. Neck: Supple, no JVD, no masses  Cardiovascular: RRR, S1 S2 Respiratory: Clear to auscultation bilaterally with equal chest rise  Abdomen: Soft, obese, nontender, nondistended, + bowel sounds  Extremities: warm dry without cyanosis clubbing or edema.  Skin: Without rashes exudates or nodules.  Pale.       Data Reviewed: Basic Metabolic Panel:  Recent Labs Lab 10/16/13 0155 10/16/13 0231 10/16/13 1310 10/17/13 1146 10/18/13 0620  NA 134* 135* 134* 136* 138  K 4.6 4.3 3.4* 3.5* 3.7  CL 99 100 99 104 107  CO2 18*  --  15* 16* 14*  GLUCOSE 154* 150* 134* 90 78  BUN 45*  42* 45* 48* 49*  CREATININE 2.40* 2.80* 2.43* 2.63* 2.60*  CALCIUM 9.5  --  7.8* 7.8* 7.5*  MG  --   --   --  1.6  --   PHOS  --   --   --  4.1  --    Liver Function Tests:  Recent Labs Lab 10/16/13 0155 10/16/13 1310 10/17/13 1146 10/18/13 0620  AST 66* 65* 56* 70*  ALT 76* 66* 63* 70*  ALKPHOS 202* 162* 149* 172*  BILITOT 6.4* 6.1* 9.5* 11.6*  PROT  5.0* 4.8* 4.7* 4.8*  ALBUMIN 2.7* 2.5* 2.4* 2.4*    Recent Labs Lab 10/16/13 0155  LIPASE 23   CBC:  Recent Labs Lab 10/16/13 0155 10/16/13 0231 10/16/13 1310 10/17/13 1146 10/17/13 1705 10/18/13 0620  WBC 14.4*  --  15.2* 12.9* 18.1* 15.0*  NEUTROABS  --   --   --   --  3.6 3.6  HGB 8.1* 9.2* 7.1* 10.2* 10.6* 10.3*  HCT 24.4* 27.0* 22.0* 30.7* 32.0* 31.3*  MCV 103.0*  --  105.8* 94.5 94.7 95.1  PLT 13*  --  60* 24* 32* 20*   Cardiac Enzymes:  Recent Labs Lab 10/16/13 0740  TROPONINI <0.30     Recent Results (from the past 240 hour(s))  MRSA PCR SCREENING     Status: None   Collection Time    10/16/13  7:26 AM      Result Value Range Status   MRSA by PCR NEGATIVE  NEGATIVE Final   Comment:            The GeneXpert MRSA Assay (FDA     approved for NASAL specimens     only), is one component of a     comprehensive MRSA colonization     surveillance program. It is not     intended to diagnose MRSA     infection nor to guide or     monitor treatment for     MRSA infections.  CLOSTRIDIUM DIFFICILE BY PCR     Status: None   Collection Time    10/16/13  7:36 PM      Result Value Range Status   C difficile by pcr NEGATIVE  NEGATIVE Final   Comment: Performed at [facility]     Studies: Ir Fluoro Guide Cv Line Right  10/17/2013   CLINICAL DATA:  68 year old female with influenza and needs IV access. Patient has known lower SVC occlusion.  EXAM: RIGHT PICC LINE PLACEMENT WITH ULTRASOUND AND FLUOROSCOPIC GUIDANCE  FLUOROSCOPY TIME:  54 seconds  PROCEDURE: The patient was advised of the possible risks and complications and agreed to undergo the procedure. The patient was then brought to the angiographic suite for the procedure.  The right arm was prepped with chlorhexidine, draped in the usual sterile fashion using maximum barrier technique (cap and mask, sterile gown, sterile gloves, large sterile sheet, hand hygiene and cutaneous antisepsis) and infiltrated  locally with 1% Lidocaine.  Ultrasound demonstrated patency of the right brachial vein, and this was documented with an image. Under real-time ultrasound guidance, this vein was accessed with a 21 gauge micropuncture needle and image documentation was performed. A 0.018 wire was introduced in to the vein. A peel-away sheath was placed. A 5 French dual lumen power injectable PICC was advanced to the right subclavian vein. Fluoroscopy during the procedure and fluoro spot radiograph confirms appropriate catheter position. The catheter was flushed and covered with a sterile dressing.  Complications: None  IMPRESSION: Successful right arm power injectable PICC line placement with ultrasound and fluoroscopic guidance. The catheter is ready for use. Catheter tip in the right subclavian vein due to SVC occlusion.   Electronically Signed   By: Markus Daft M.D.   On: 10/17/2013 13:17   Ir US Guide Vasc Access Right  10/17/2013   CLINICAL DATA:  68 year old female with influenza and needs IV access. Patient has known lower SVC occlusion.  EXAM: RIGHT PICC LINE PLACEMENT WITH ULTRASOUND AND FLUOROSCOPIC GUIDANCE  FLUOROSCOPY TIME:  54 seconds  PROCEDURE: The patient was advised of the possible risks and complications and agreed to undergo the procedure. The patient was then brought to the angiographic suite for the procedure.  The right arm was prepped with chlorhexidine, draped in the usual sterile fashion using maximum barrier technique (cap and mask, sterile gown, sterile gloves, large sterile sheet, hand hygiene and cutaneous antisepsis) and infiltrated locally with 1% Lidocaine.  Ultrasound demonstrated patency of the right brachial vein, and this was documented with an image. Under real-time ultrasound guidance, this vein was accessed with a 21 gauge micropuncture needle and image documentation was performed. A 0.018 wire was introduced in to the vein. A peel-away sheath was placed. A 5 French dual lumen power  injectable PICC was advanced to the right subclavian vein. Fluoroscopy during the procedure and fluoro spot radiograph confirms appropriate catheter position. The catheter was flushed and covered with a sterile dressing.  Complications: None  IMPRESSION: Successful right arm power injectable PICC line placement with ultrasound and fluoroscopic guidance. The catheter is ready for use. Catheter tip in the right subclavian vein due to SVC occlusion.   Electronically Signed   By: Markus Daft M.D.   On: 10/17/2013 13:17    Scheduled Meds: . sodium chloride  500 mL Intravenous Once  . acyclovir  200 mg Oral BID  . antiseptic oral rinse  15 mL Mouth Rinse 6 X Daily  . guaiFENesin  600 mg Oral BID  . ipratropium-albuterol  3 mL Nebulization BID  . levothyroxine  50 mcg Oral QAC breakfast  . loperamide  2 mg Oral TID  . mometasone-formoterol  2 puff Inhalation BID  . oseltamivir  30 mg Oral Daily  . pantoprazole  40 mg Oral Daily  . sodium chloride  3 mL Intravenous Q12H   Continuous Infusions: . sodium chloride 50 mL/hr at 10/18/13 6629    Principal Problem:   Sepsis Active Problems:   Multiple myeloma   Dehydration   Nausea, vomiting, and diarrhea   Thrombocytopenia   Anemia   Acute febrile illness   Influenza due to identified novel influenza A virus with other respiratory manifestations    Calvert Cantor  [facility] Pager [phone] If 7PM-7AM, please contact night-coverage at [email or web address], password [email or web address] 10/18/2013, 4:27 PM  LOS: 3 days

## 2013-10-18 NOTE — Progress Notes (Addendum)
Palliative Medicine Team Progress Note  Received and reviewed results of stat portable CXR. New right upper and lower lobe airspace disease, consistent with Pneumonia.I am concerned for a post-influenza secondary bacterial PNA which carries an ominous prognosis given her current level of debility and myeloma. I shared the results and possible disease trajectories with her family at bedside-she received one dose of alprazolam for air hunger and anxiety and is now resting comfortably. Will need to re-address her goals in the context of this developing PNA. Will add on Levaquin to be in line with her previously stated desire for treatment.  Additional 35 minutes at bedside. Greater than 50%  of this time was spent counseling and coordinating care related to the above assessment and plan.  Stacie Hacker, DO Palliative Medicine

## 2013-10-18 NOTE — Progress Notes (Signed)
[facility] MSW note: Pt is a current hospice home care pt. This is a related admission. Pt is a DNR. Pt was alert and oriented x3. Pt denied pain. Pt is having right arm bruising, swelling and pain s/p midline PICC. Pt stated that she does feel better since coming to the hospital. Pt hopes to return home. Currently, daughter and son in law are sick that live in the home. Pt appreciative of hospice visits. MSw offered emotional support. Please call with concerns or questions.   Marilynne Halsted, MSW [phone]

## 2013-10-18 NOTE — Progress Notes (Signed)
IR PA asked to evaluate the patient given c/o right arm bruising, swelling and pain s/p midline PICC with patient with known SVC syndrome placed by IR 12/31. The patient denies any numbness or tingling of her right digits, but does admit to feeling cooler than the left digits. She c/o increased bruising and swelling with associated pain. The patient's platelet count today is 20k and yesterday during procedure 32k. The patient does have radial pulses palpable bilaterally. I have spoke with Dr. Laurence Ferrari today and he recommends elevating the RUE given the patient's platelet count she may have had some procedural bleeding around access site and this will take time to absorb and swelling to improve. IR would also recommend venous imaging of the RUE to evaluate for thrombus given she is in a more coagulable state with her significant PMHx. IR will follow up 10/19/13 to see how patient is doing. PICC flushes and aspirates well with IV fluids currently infusing.   Tsosie Billing PA-C Interventional Radiology  10/18/13  11:31 AM

## 2013-10-18 NOTE — Consult Note (Addendum)
Palliative Medicine Team Consult Note   HPI: [name] is a 68 yo woman with end-stage multiple myeloma, followed by Dr. Marin Olp and currently enrolled in Hospice home care with [facility]. She was admitted with Influenza. She has been getting outpatient blood transfusions. PMT consulted by hospitalist service for goals of care and symptom management-the hospice care team is also following.   I met with [name], her son and her daughter on speaker phone. She has been declining at home for the past 2 weeks, requiring more assistance with her ADLs, full assist from bed to bathroom, has had falls and a general decline. She was admitted with respiratory failure and was projected to not survive her critical illness, but she has since stabilized and was found to have flu-while she has had transient improvement she is this evening not feeling well again. She is having issues with her PICC line- no clot/DVT, but the site is oozing blood and serous fluid, it is not hot to touch, but there is extremity ecchymosis and tense skin-she does complain of pain at the site. Her major source of pain is in her chest and upper back. She also has issues with anxiety.  Goals of Care:  1. DNR  2. She tells me she has been given a 3 week prognosis-but this prognostication she tells me is not new to her-she is accepting of her illness-but is very anxious about how quickly and rapidly things "got bad quick". She feels that she is ok with her current level of intervention and that she is tolerating hospitalization and interventions without significant suffering- "nothing I can endure for now".  3. Hospice Home Care, I expressed concerns about her safety and need for close supervision if she is able to be discharged-she has had falls-her daughter works from home from 8-5 but also has young kids at home and cannot provide level of care she will likely need. Her son knows about [employer], but the patient herself got  anxious about that discussion and expressed a wish to return to her daughter's house. Will defer to hospice team regarding further discussion on hospice facility level care.  4. She desires treatment for reversible conditions. Transfusions per Dr. Marin Olp if indicated or helpful for QOL.  Assessment/Recommendations:  1. Influenza with Respiratory Failure: this evening having worsening cough, now with thick brown/black sputum that I have seen. I reviewed her chest xray from 12/30 and noted that she has multiple acute and old rib fracture-quite dramatic. She endorses that she is splinting and not taking deep breaths due to severe pain. "I don't even try to take a deep breath". This puts her at increased risk for secondary bacterial PNA following influenza which can be severe. She would desire aggressive treatment for this including IV antibiotics if indicated. Maximize nebulizers, place a lidoderm patch on her chest wall. She is having dyspnea-she doesn't tolerate the O2 nasal cannula well.  2. Pain secondary to pathologic fractures and recurrent falls at home. She has multiple rib fracture-pathologic, probably also related to falls at home (X2), and possibly from coughing. She has severe itching with current opiate regimen. Will try Fentanyl which has less puritus associated with administration-if she tolerates IV form can place a duragesic patch.Will also schedule Tylenol.  3. Puritus, secondary to opioids and very dry skin  Sarna Lotion PRN  Scheduled Zofran TID-blocks serotonin mediated itching from opiates and is generally well tolerated.  Benedryl probably not effective if opiate related but will leave on as  prn-may be allergic histamine component to her itching.  4. Anxiety, situational and very appropriate given circumstances. Provided emtional support and reassurance-will also give her by her request additional alprazolam. Increase to 0.5 TID and give her a once time dose of 31m now.  5.  PICC Line issues: Line was leaking sero-sanguinous blood into her bed on my evaluation and her arm is swollen, but fortunately she isnt having pain, there is no clot and it seems to be working for now-asked PICC nurse to change the drg and evaluate again.   Thank you for allowing our team to assist with the care of this very kind patient and her family. Will follow.  70 minutes. Greater than 50%  of this time was spent counseling and coordinating care related to the above assessment and plan.  ELane Hacker DO Palliative Medicine

## 2013-10-18 NOTE — Progress Notes (Addendum)
Ask to evaluate right upper arm due to complaints of pain and swelling. Right upper arm Midline was placed 12/31 and arm is tender per patient. Noted bruised area to right medial arm from insertion site to mid-forearm. Right hand cold and some swelling noted. Blood backed up in red port, was able to flush with NS 10 ml without difficulty. Arm elevated and nurse notified of my findings.   Recommended IR be contacted and inform MD.

## 2013-10-18 DEATH — deceased

## 2013-10-19 ENCOUNTER — Inpatient Hospital Stay (HOSPITAL_COMMUNITY)

## 2013-10-19 DIAGNOSIS — J189 Pneumonia, unspecified organism: Secondary | ICD-10-CM

## 2013-10-19 DIAGNOSIS — R05 Cough: Secondary | ICD-10-CM

## 2013-10-19 DIAGNOSIS — R059 Cough, unspecified: Secondary | ICD-10-CM

## 2013-10-19 LAB — CBC WITH DIFFERENTIAL/PLATELET
BAND NEUTROPHILS: 0 % (ref 0–10)
BASOS ABS: 0 10*3/uL (ref 0.0–0.1)
BASOS PCT: 0 % (ref 0–1)
Blasts: 0 %
Eosinophils Absolute: 0 10*3/uL (ref 0.0–0.7)
Eosinophils Relative: 0 % (ref 0–5)
HEMATOCRIT: 29.7 % — AB (ref 36.0–46.0)
Hemoglobin: 10.1 g/dL — ABNORMAL LOW (ref 12.0–15.0)
Lymphocytes Relative: 51 % — ABNORMAL HIGH (ref 12–46)
Lymphs Abs: 6 10*3/uL — ABNORMAL HIGH (ref 0.7–4.0)
MCH: 32.3 pg (ref 26.0–34.0)
MCHC: 34 g/dL (ref 30.0–36.0)
MCV: 94.9 fL (ref 78.0–100.0)
MYELOCYTES: 2 %
Metamyelocytes Relative: 3 %
Monocytes Absolute: 1.4 10*3/uL — ABNORMAL HIGH (ref 0.1–1.0)
Monocytes Relative: 12 % (ref 3–12)
NEUTROS PCT: 32 % — AB (ref 43–77)
Neutro Abs: 4.4 10*3/uL (ref 1.7–7.7)
Platelets: 13 10*3/uL — CL (ref 150–400)
Promyelocytes Absolute: 0 %
RBC: 3.13 MIL/uL — ABNORMAL LOW (ref 3.87–5.11)
RDW: 25.1 % — ABNORMAL HIGH (ref 11.5–15.5)
WBC: 11.8 10*3/uL — ABNORMAL HIGH (ref 4.0–10.5)
nRBC: 15 /100 WBC — ABNORMAL HIGH

## 2013-10-19 LAB — COMPREHENSIVE METABOLIC PANEL
ALK PHOS: 190 U/L — AB (ref 39–117)
ALT: 76 U/L — AB (ref 0–35)
AST: 73 U/L — ABNORMAL HIGH (ref 0–37)
Albumin: 2.2 g/dL — ABNORMAL LOW (ref 3.5–5.2)
BUN: 48 mg/dL — ABNORMAL HIGH (ref 6–23)
CALCIUM: 7.1 mg/dL — AB (ref 8.4–10.5)
CO2: 13 mEq/L — ABNORMAL LOW (ref 19–32)
Chloride: 109 mEq/L (ref 96–112)
Creatinine, Ser: 2.41 mg/dL — ABNORMAL HIGH (ref 0.50–1.10)
GFR calc Af Amer: 23 mL/min — ABNORMAL LOW (ref >90)
GFR calc non Af Amer: 20 mL/min — ABNORMAL LOW (ref >90)
Glucose, Bld: 83 mg/dL (ref 70–99)
POTASSIUM: 3.5 meq/L — AB (ref 3.7–5.3)
SODIUM: 138 meq/L (ref 137–147)
TOTAL PROTEIN: 4.5 g/dL — AB (ref 6.0–8.3)
Total Bilirubin: 14.1 mg/dL — ABNORMAL HIGH (ref 0.3–1.2)

## 2013-10-19 LAB — TYPE AND SCREEN
ABO/RH(D): A NEG
Antibody Screen: NEGATIVE
Unit division: 0
Unit division: 0

## 2013-10-19 MED ORDER — ONDANSETRON 8 MG PO TBDP
8.0000 mg | ORAL_TABLET | Freq: Three times a day (TID) | ORAL | Status: DC
Start: 2013-10-19 — End: 2013-10-22
  Administered 2013-10-19 – 2013-10-21 (×6): 8 mg via ORAL
  Filled 2013-10-19 (×13): qty 1

## 2013-10-19 MED ORDER — ALPRAZOLAM 0.5 MG PO TABS
0.5000 mg | ORAL_TABLET | Freq: Every day | ORAL | Status: DC
  Administered 2013-10-19: 0.5 mg via ORAL
  Filled 2013-10-19: qty 1

## 2013-10-19 MED ORDER — MORPHINE (PF) INJECTION FOR INHALATION 10 MG/ML
10.0000 mg | Freq: Four times a day (QID) | RESPIRATORY_TRACT | Status: DC
  Administered 2013-10-19 – 2013-10-23 (×16): 10 mg via RESPIRATORY_TRACT
  Filled 2013-10-19 (×14): qty 1

## 2013-10-19 MED ORDER — BIOTENE DRY MOUTH MT LIQD: 15.0000 mL | OROMUCOSAL | Status: DC | PRN

## 2013-10-19 NOTE — Progress Notes (Signed)
Will formally have Oncology & Dr.Ennever take over attending services.  Attempted to leave message earlier & spoke with Dr.Shidad.  Will make transition in the morning.

## 2013-10-19 NOTE — Progress Notes (Signed)
Room [facility] RN Visit-R.Aeisha Minarik RN  Related admission to [facility] diagnosis of Multiple myeloma.  Pt is DNR code with OOF DNR on shadow chart.  Pt alert & oriented, lying upright in bed, with no complaints of pain or discomfort.  Pt's only complaint is dry throat - requested ice chips and Coke.  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   No family present. Patient's home medication list is on shadow chart.   Per Dr. Antonieta Pert note, pt now being treated for PNA with elevated bilirubin and he does not think pt will leave the hospital.    Please call [facility] @ [phone]- ask for RN Liaison or after hours,ask for on-call RN with any hospice needs.   Thank you.  Vista Lawman, RN  [facility]  Hospice Liaison  [phone])

## 2013-10-19 NOTE — Progress Notes (Signed)
Mrs. Parrilla now has pneumonia. I believe that this will be "final straw" for her. Her bilirubin is now up to 15. She is weaker. She's not eating much at all.  There is some slight swelling in the right arm. She has some ecchymoses. I suspect that she will has some bleeding under the skin where she had her PICC line placed. I will give her a unit of platelets and I think this will be the last transfusion for her.  Her mouth is very dry.  She is coughing. She sound somewhat congested. I'll try some morphine nebulizers.  I do not believe that she will he the hospital. I suspect now that her prognosis is easily less than 2 weeks. Her family knows this and understands this. We just want to make sure that she is comfortable.  Her vital signs show temperature of 97.5. Blood pressure is 115/74. Her lungs showed decent breath sounds bilaterally. Cardiac exam is slightly tachycardic but regular. Abdomen is soft. Bowel sounds are slightly decreased. Extremities shows some ecchymoses. Neurological exam shows some intermittent disorientation.  Again, I suspect that she will succomb to liver failure. The pneumonia will move this along more quickly.  Again, her family understands the imminent nature of her passing. There are very thankful for the wonderful care that she is getting from the staff and doctors on [address].  Lum Keas  2 Timothy 4:16-18

## 2013-10-19 NOTE — Progress Notes (Signed)
Clinical Social Work Department BRIEF PSYCHOSOCIAL ASSESSMENT 10/19/2013  Patient:  [name], [name]     Account Number:  [id number]     [place] date:  10/16/2013  Clinical Social Worker:  Earlie Server  Date/Time:  10/19/2013 01:45 PM  Referred by:  Physician  Date Referred:  10/19/2013 Referred for  Residential hospice placement   Other Referral:   Interview type:  Patient Other interview type:    PSYCHOSOCIAL DATA Living Status:  FAMILY Admitted from facility:   Level of care:   Primary support name:  Anderson Malta Primary support relationship to patient:  CHILD, ADULT Degree of support available:   Strong    CURRENT CONCERNS Current Concerns  Post-Acute Placement   Other Concerns:    SOCIAL WORK ASSESSMENT / PLAN CSW received referral to assist with GIP vs hospice needs. CSW spoke with Hospice RN who reports patient is not GIP appropriate due to being a hospice patient already. CSW spoke with hospice SW who reports family called and left a message on 10/18/13 regarding DC plans. Family reports that they are unsure if they can manage patient at home if she DC due to having small children. Hospice SW reports she spoke with family who reports they spoke with Dr. Marin Olp who reports patient is not ready to DC and should have a hospital death.    CSW met with patient and friends at bedside. Patient reports she is feeling somewhat better but reports that she is still experiencing some pain. Patient reports that she lives at home with dtr and dtr plans on coming to stay with her today. Patient reports whenever she leaves she wants to go back home. CSW spoke with attending MD regarding family perspective vs information from MD and barriers with discussing residential hospice due to family's belief that patient can remain in the hospital.    CSW will continue to follow and will assist as needed with DC plans.   Assessment/plan status:  Psychosocial Support/Ongoing Assessment of  Needs Other assessment/ plan:   Information/referral to community resources:   CSW will continue to follow and will assist with residential hospice placement if that is what patient and family desire.    PATIENT'S/FAMILY'S RESPONSE TO PLAN OF CARE: Patient alert and oriented. Patient engaged in assessment and happy that friends are visiting her. Patient reports she will talk with dtr tonight but wants to return home if she DC from the hospital.       Sindy Messing, LCSW [phone]

## 2013-10-19 NOTE — Progress Notes (Signed)
Clinical Social Work  CSW consulted due to patient being hospice patient and inquiring about GIP. Patient is not eligible for GIP due to already being hospice patient. CSW reviewed chart which stated that during [place] meeting, [employer] was suggested but patient was hesitant about decision. PMT requested that hospice SW speak with family about goals as well. CSW called [facility] SW) and left a message in order to staff case. CSW will continue to follow.  [place], [place] [phone]

## 2013-10-19 NOTE — Progress Notes (Addendum)
Palliative Medicine Team Progress Note  Problem List:  1. Multiple Myeloma, advanced 2. Post-viral PNA 3. Influenza 4. Anxiety 5. Failure to thrive 6. Liver Failure 7. Renal Failure 8. Rib Fractures 9. Frequent Falls 10. Dyspnea 11. Pain 12. Puritus 13. Diarrhea   [name] appears to be doing better this evening. She reports sleeping very well last night and thinks the Xanax has helped her with sleep and anxiety. Tolerating morphine nebs per Dr. Marin Olp. Itching is better-as her bili rises it may become more of an issue but yesterday her family felt like it was from the oxycodone-changed Zofran to oral TID for itching and she got one dose of benedryl earlier. She received 2 doses of the IV fentanyl with good response in addition to the morphine nebs. She is alert and conversant-she is ordering dinner. Noted transition of attending service. Overall her symptoms are now improved and stable, her goals of care are established. If Palliative Medicine Team involvement is desired please re-consult by placing an order in EPIC- we would be happy to continue to follow along and support this patient and family who are now under the excellent care of the oncology service and the hospice team.   25 minutes. Greater than 50%  of this time was spent counseling and coordinating care related to the above assessment and plan.  Lane Hacker, DO Palliative Medicine

## 2013-10-19 NOTE — Progress Notes (Signed)
CRITICAL VALUE ALERT  Critical value received:  plts 13  Date of notification: 10/19/13  Time of notification:  0650  Critical value read back: yes  Nurse who received alert:  TR  MD notified (1st page):  McClung  Time of first page: 0650  MD notified (2nd page):  Time of second page:  Responding MD:   Time MD responded

## 2013-10-20 DIAGNOSIS — R233 Spontaneous ecchymoses: Secondary | ICD-10-CM

## 2013-10-20 LAB — COMPREHENSIVE METABOLIC PANEL
ALBUMIN: 2.2 g/dL — AB (ref 3.5–5.2)
ALT: 79 U/L — ABNORMAL HIGH (ref 0–35)
AST: 77 U/L — AB (ref 0–37)
Alkaline Phosphatase: 207 U/L — ABNORMAL HIGH (ref 39–117)
BILIRUBIN TOTAL: 14.9 mg/dL — AB (ref 0.3–1.2)
BUN: 44 mg/dL — AB (ref 6–23)
CALCIUM: 7.3 mg/dL — AB (ref 8.4–10.5)
CHLORIDE: 110 meq/L (ref 96–112)
CO2: 15 mEq/L — ABNORMAL LOW (ref 19–32)
Creatinine, Ser: 2.32 mg/dL — ABNORMAL HIGH (ref 0.50–1.10)
GFR calc Af Amer: 24 mL/min — ABNORMAL LOW (ref >90)
GFR calc non Af Amer: 21 mL/min — ABNORMAL LOW (ref >90)
Glucose, Bld: 72 mg/dL (ref 70–99)
Potassium: 3.6 mEq/L — ABNORMAL LOW (ref 3.7–5.3)
Sodium: 142 mEq/L (ref 137–147)
Total Protein: 4.5 g/dL — ABNORMAL LOW (ref 6.0–8.3)

## 2013-10-20 LAB — CBC WITH DIFFERENTIAL/PLATELET
BASOS ABS: 0.5 10*3/uL — AB (ref 0.0–0.1)
Basophils Relative: 4 % — ABNORMAL HIGH (ref 0–1)
EOS PCT: 0 % (ref 0–5)
Eosinophils Absolute: 0 10*3/uL (ref 0.0–0.7)
HCT: 29.3 % — ABNORMAL LOW (ref 36.0–46.0)
Hemoglobin: 9.8 g/dL — ABNORMAL LOW (ref 12.0–15.0)
Lymphocytes Relative: 12 % (ref 12–46)
Lymphs Abs: 1.5 10*3/uL (ref 0.7–4.0)
MCH: 32.3 pg (ref 26.0–34.0)
MCHC: 33.4 g/dL (ref 30.0–36.0)
MCV: 96.7 fL (ref 78.0–100.0)
Monocytes Absolute: 8 10*3/uL — ABNORMAL HIGH (ref 0.1–1.0)
Monocytes Relative: 63 % — ABNORMAL HIGH (ref 3–12)
NEUTROS PCT: 21 % — AB (ref 43–77)
Neutro Abs: 2.6 10*3/uL (ref 1.7–7.7)
Platelets: 27 10*3/uL — CL (ref 150–400)
RBC: 3.03 MIL/uL — AB (ref 3.87–5.11)
RDW: 25.4 % — AB (ref 11.5–15.5)
WBC: 12.6 10*3/uL — AB (ref 4.0–10.5)

## 2013-10-20 LAB — PREPARE PLATELET PHERESIS: UNIT DIVISION: 0

## 2013-10-20 LAB — PROCALCITONIN: Procalcitonin: 20.3 ng/mL

## 2013-10-20 MED ORDER — ACYCLOVIR 200 MG PO CAPS
200.0000 mg | ORAL_CAPSULE | Freq: Two times a day (BID) | ORAL | Status: DC
  Administered 2013-10-20 (×2): 200 mg via ORAL
  Filled 2013-10-20 (×4): qty 1

## 2013-10-20 NOTE — Progress Notes (Signed)
Mrs. Shawhan is stable. He is still very weak. Not eating much. No nausea or vomiting. Her morphine nebulizers seem to be helping. She's not having problems with bleeding. She still has the large ecchymoses on the right arm with her PICC line. She got platelets yesterday. Now is 27,000.  Her bilirubin is up to 15. Her creatinine is a little bit better at 2.3.  She's not complain of any dyspnea. Overall, she does seem to be fairly comfortable.  Her vital signs looked okay. Blood pressure 138/66. She is afebrile. Right arm does not appear to be a swollen. She does have ecchymoses on the inside of her right upper arm. She has good pulses. Cardiac exam regular rate and rhythm. Lungs showed decent breath sounds. Abdomen shows some slight decrease in bowel sounds. Extremities shows no clubbing or cyanosis. Skin exam does show some scattered ecchymoses or petechia.  We will continue comfort care. We will start to adjust her medicines.  We will have to see how her bilirubin continues to trend.  I still do not expect her to leave the hospital.  I still suspect that her prognosis will be less than 2 weeks.  Stacie E.

## 2013-10-20 NOTE — Progress Notes (Signed)
Inpatient RN visit Room [facility] RN Visit-Erin Maxwell Caul RN  Related admission to [facility] diagnosis of Multiple myeloma.  Pt is DNR code with OOF DNR on shadow chart.  Pt alert & oriented, lying upright in bed, c/o abdominal pain 7/10. patient also c/o incontinence of urine and diarrhea requesitng a brief. Notified nurse and she will address the issues. Spoke with son Merry Proud at bedside. Merry Proud stated that Dr. Marin Olp told him that his mother would probably not be leaving the hospital. Merry Proud has no concerns at this time.  Please call [facility] @ [phone]- ask for RN Liaison or after hours,ask for on-call RN with any hospice needs.  Thank you. Ardath Sax , RN On Call

## 2013-10-21 DIAGNOSIS — F4489 Other dissociative and conversion disorders: Secondary | ICD-10-CM

## 2013-10-21 DIAGNOSIS — K7689 Other specified diseases of liver: Secondary | ICD-10-CM

## 2013-10-21 LAB — CBC WITH DIFFERENTIAL/PLATELET
Basophils Absolute: 0.7 10*3/uL — ABNORMAL HIGH (ref 0.0–0.1)
Basophils Relative: 4 % — ABNORMAL HIGH (ref 0–1)
EOS ABS: 0.2 10*3/uL (ref 0.0–0.7)
EOS PCT: 1 % (ref 0–5)
HCT: 30 % — ABNORMAL LOW (ref 36.0–46.0)
Hemoglobin: 10.1 g/dL — ABNORMAL LOW (ref 12.0–15.0)
Lymphocytes Relative: 14 % (ref 12–46)
Lymphs Abs: 2.4 10*3/uL (ref 0.7–4.0)
MCH: 32.3 pg (ref 26.0–34.0)
MCHC: 33.7 g/dL (ref 30.0–36.0)
MCV: 95.8 fL (ref 78.0–100.0)
MONO ABS: 10.3 10*3/uL — AB (ref 0.1–1.0)
Monocytes Relative: 59 % — ABNORMAL HIGH (ref 3–12)
Neutro Abs: 3.8 10*3/uL (ref 1.7–7.7)
Neutrophils Relative %: 22 % — ABNORMAL LOW (ref 43–77)
PLATELETS: 15 10*3/uL — AB (ref 150–400)
RBC: 3.13 MIL/uL — AB (ref 3.87–5.11)
RDW: 25.5 % — ABNORMAL HIGH (ref 11.5–15.5)
WBC: 17.4 10*3/uL — ABNORMAL HIGH (ref 4.0–10.5)

## 2013-10-21 LAB — COMPREHENSIVE METABOLIC PANEL
ALBUMIN: 2.1 g/dL — AB (ref 3.5–5.2)
ALT: 85 U/L — ABNORMAL HIGH (ref 0–35)
AST: 72 U/L — ABNORMAL HIGH (ref 0–37)
Alkaline Phosphatase: 245 U/L — ABNORMAL HIGH (ref 39–117)
BUN: 43 mg/dL — ABNORMAL HIGH (ref 6–23)
CO2: 15 mEq/L — ABNORMAL LOW (ref 19–32)
CREATININE: 2.55 mg/dL — AB (ref 0.50–1.10)
Calcium: 7.2 mg/dL — ABNORMAL LOW (ref 8.4–10.5)
Chloride: 108 mEq/L (ref 96–112)
GFR calc Af Amer: 21 mL/min — ABNORMAL LOW (ref >90)
GFR calc non Af Amer: 19 mL/min — ABNORMAL LOW (ref >90)
Glucose, Bld: 82 mg/dL (ref 70–99)
Potassium: 3.6 mEq/L — ABNORMAL LOW (ref 3.7–5.3)
SODIUM: 139 meq/L (ref 137–147)
Total Bilirubin: 15.4 mg/dL — ABNORMAL HIGH (ref 0.3–1.2)
Total Protein: 4.5 g/dL — ABNORMAL LOW (ref 6.0–8.3)

## 2013-10-21 MED ORDER — MORPHINE SULFATE 2 MG/ML IJ SOLN
1.0000 mg | INTRAMUSCULAR | Status: DC | PRN
  Administered 2013-10-21 – 2013-10-24 (×10): 2 mg via INTRAVENOUS
  Filled 2013-10-21 (×11): qty 1

## 2013-10-21 MED ORDER — MORPHINE SULFATE 2 MG/ML IJ SOLN
INTRAMUSCULAR | Status: AC
  Administered 2013-10-21: 2 mg via INTRAVENOUS
  Filled 2013-10-21: qty 1

## 2013-10-21 MED ORDER — LORAZEPAM 2 MG/ML IJ SOLN
1.0000 mg | INTRAMUSCULAR | Status: DC | PRN
  Administered 2013-10-21 – 2013-10-22 (×3): 1 mg via INTRAVENOUS
  Filled 2013-10-21 (×3): qty 1

## 2013-10-21 MED ORDER — SCOPOLAMINE 1 MG/3DAYS TD PT72
1.0000 | MEDICATED_PATCH | TRANSDERMAL | Status: DC
  Administered 2013-10-21 – 2013-10-24 (×2): 1.5 mg via TRANSDERMAL
  Filled 2013-10-21 (×3): qty 1

## 2013-10-21 NOTE — Progress Notes (Signed)
[facility] MSW note: This is a hospice related admission. Pt denied pain on visit. Pt appeared more jaundiced then on previous visit. Pt seemed more withdrawn and distant. Son, sister-Pat and 2 friends at bedside. Son and sister aware of pt's decline. Son expressed how he understood Dr. Marin Olp that pt may not live through the week. MSW spoke with [place], [place] on Friday to discuss family's understanding of pt remaining in the hospital. MSW educated family on signs of decline. MSW discussed the importance of having closure with pt. MSW offered support and education.   Marilynne Halsted, MSW [phone]

## 2013-10-21 NOTE — Progress Notes (Signed)
ANTIBIOTIC CONSULT NOTE - FOLLOW UP  Pharmacy Consult for Levaquin Indication: pneumonia  Allergies  Allergen Reactions  . Trazodone And Nefazodone Other (See Comments)    nightmares  . Vicodin [Hydrocodone-Acetaminophen] Itching    Pt tried to take again and had itching.  Marland Kitchen Hydromorphone Itching  . Nefazodone Hives    nightmares  . Tramadol Itching  . Trazodone Hives    nightmares  . Morphine Rash  . Morphine And Related Rash  . Penicillins Rash    Patient Measurements: Height: 4\' 10"  (147.3 cm) Weight: 108 lb (48.988 kg) IBW/kg (Calculated) : 40.9  Vital Signs: Temp: 98.1 F (36.7 C) (01/04 0629) Temp src: Oral (01/04 0629) BP: 94/62 mmHg (01/04 0629) Pulse Rate: 89 (01/04 0629) Intake/Output from previous day: 01/03 0701 - 01/04 0700 In: 600 [P.O.:360; I.V.:240] Out: 600 [Urine:600] Intake/Output from this shift:    Labs:  Recent Labs  10/19/13 0511 10/20/13 0540 10/21/13 0437  WBC 11.8* 12.6* 17.4*  HGB 10.1* 9.8* 10.1*  PLT 13* 27* 15*  CREATININE 2.41* 2.32* 2.55*   Estimated Creatinine Clearance: 14 ml/min (by C-G formula based on Cr of 2.55). No results found for this basename: VANCOTROUGH, Corlis Leak, VANCORANDOM, GENTTROUGH, GENTPEAK, GENTRANDOM, TOBRATROUGH, TOBRAPEAK, TOBRARND, AMIKACINPEAK, AMIKACINTROU, AMIKACIN,  in the last 72 hours   Microbiology: Recent Results (from the past 720 hour(s))  MRSA PCR SCREENING     Status: None   Collection Time    10/16/13  7:26 AM      Result Value Range Status   MRSA by PCR NEGATIVE  NEGATIVE Final   Comment:            The GeneXpert MRSA Assay (FDA     approved for NASAL specimens     only), is one component of a     comprehensive MRSA colonization     surveillance program. It is not     intended to diagnose MRSA     infection nor to guide or     monitor treatment for     MRSA infections.  CLOSTRIDIUM DIFFICILE BY PCR     Status: None   Collection Time    10/16/13  7:36 PM      Result Value  Range Status   C difficile by pcr NEGATIVE  NEGATIVE Final   Comment: Performed at [facility]   Start     Dose/Rate Route Frequency Ordered Stop   10/20/13 2000  levofloxacin (LEVAQUIN) IVPB 500 mg     500 mg 100 mL/hr over 60 Minutes Intravenous Every 48 hours 10/18/13 1937     10/20/13 1000  acyclovir (ZOVIRAX) 200 MG capsule 200 mg  Status:  Discontinued     200 mg Oral 2 times daily 10/20/13 0540 10/21/13 0731   10/18/13 2000  levofloxacin (LEVAQUIN) IVPB 750 mg     750 mg 100 mL/hr over 90 Minutes Intravenous  Once 10/18/13 1937 10/18/13 2245   10/18/13 1800  vancomycin (VANCOCIN) 500 mg in sodium chloride 0.9 % 100 mL IVPB  Status:  Discontinued     500 mg 100 mL/hr over 60 Minutes Intravenous Every 48 hours 10/16/13 0638 10/16/13 1314   10/18/13 1000  acyclovir (ZOVIRAX) tablet 200 mg  Status:  Discontinued     200 mg Oral 2 times daily 10/18/13 0825 10/20/13 0540   10/17/13 1000  oseltamivir (TAMIFLU) capsule 30 mg     30 mg Oral Daily 10/16/13 0644 10/20/13 1020   10/16/13 1215  oseltamivir (TAMIFLU) capsule 75 mg  Status:  Discontinued     75 mg Oral Daily 10/16/13 1205 10/16/13 1240   10/16/13 1000  acyclovir (ZOVIRAX) tablet 400 mg  Status:  Discontinued     400 mg Oral 2 times daily 10/16/13 0620 10/18/13 0825   10/16/13 0645  vancomycin (VANCOCIN) IVPB 1000 mg/200 mL premix     1,000 mg 200 mL/hr over 60 Minutes Intravenous  Once 10/16/13 1610 10/16/13 1031   10/16/13 0645  ceFEPIme (MAXIPIME) 2 g in dextrose 5 % 50 mL IVPB  Status:  Discontinued     2 g 100 mL/hr over 30 Minutes Intravenous Daily 10/16/13 9604 10/16/13 0637   10/16/13 0645  ceFEPIme (MAXIPIME) 1 g in dextrose 5 % 50 mL IVPB  Status:  Discontinued     1 g 100 mL/hr over 30 Minutes Intravenous Daily 10/16/13 5409 10/16/13 1314   10/16/13 0630  azithromycin (ZITHROMAX) 500 mg in dextrose 5 % 250 mL IVPB  Status:  Discontinued     500 mg 250 mL/hr over 60 Minutes Intravenous  Every 24 hours 10/16/13 0620 10/16/13 1314   10/16/13 0530  oseltamivir (TAMIFLU) capsule 75 mg  Status:  Discontinued     75 mg Oral 2 times daily 10/16/13 0503 10/16/13 8119      Assessment: 68 yo woman with end stage MM and currently enrolled in hospice home care per palliative  Per MD, now at increased risk of secondary bacterial PNA and family would desire aggressive IV abxs at this time.  D4 Levaquin 750mg  IV x 1 then 500mg  IV q48 for CrCl 10-19 ml/min, Tamiflu completed 1/4.  Renal function worsening, CrCl ~15 ml/min  WBC rising  No bacterial cultures  Prognosis noted   Goal of Therapy:  Eradication of infection, dose per renal function  Plan:   Continue levaquin 500mg  IV q48h  Monitor renal function, duration of therapy.    Peggyann Juba, PharmD, BCPS Pager: [phone] 10/21/2013,10:25 AM

## 2013-10-21 NOTE — Progress Notes (Signed)
CRITICAL VALUE ALERT  Critical value received: PLT 15  Date of notification:  10/21/13  Time of notification: 6579  Critical value read back:yes  Nurse who received alert: Minette Headland RN  MD notified (1st page): Ennever  Time of first page:0556  MD notified (2nd page):  Time of second page:  Responding MD:   Time MD responded:

## 2013-10-21 NOTE — Progress Notes (Signed)
[name] is a little more agitated. I suspect this is all from the liver failure and hepatic encephalopathy. I explained this to her son in law. I suspect this may last a day or so. We will make sure she has Ativan to help with the agitation.  I really do not see her making it more than another 4 or 5 days. Her bilirubin is 15.5. Her renal function is declining. She still has a lot of congestion. I don't think she does bring up this congestion. She may benefit from scopolamine.  She is really not getting out of bed. Her speech is a little bit more jumbled. She has a little bit more confusion.  Her vital signs show her blood pressure to be 94/62. Heart rate is 89.  Her platelet count is 15,000. There is no bleeding.  BUN creatinine are 43 and 2.55.  She has decent air movement in her lungs. There is some crackles and wheezes. Cardiac exam is a little erratic. Abdomen is soft. Bowel sounds are decreased. Extremities shows no clubbing. Neurological exam shows no focal neurological deficits.  Again, I suspect her prognosis will be no more than 4-5 days. Comfort care is still our primary goal.  I believe that we need to allow her to see her grandchildren today. She will lapse into a coma in 2-3 days.  Lum Keas  2 Timothy 4:16-18

## 2013-10-22 ENCOUNTER — Ambulatory Visit: Payer: Medicare Other | Admitting: Hematology & Oncology

## 2013-10-22 ENCOUNTER — Other Ambulatory Visit: Payer: Medicare Other | Admitting: Lab

## 2013-10-22 ENCOUNTER — Ambulatory Visit: Payer: Medicare Other

## 2013-10-22 LAB — CBC WITH DIFFERENTIAL/PLATELET
BAND NEUTROPHILS: 1 % (ref 0–10)
BASOS ABS: 0 10*3/uL (ref 0.0–0.1)
BASOS PCT: 0 % (ref 0–1)
Blasts: 0 %
EOS ABS: 0 10*3/uL (ref 0.0–0.7)
EOS PCT: 0 % (ref 0–5)
HEMATOCRIT: 29.4 % — AB (ref 36.0–46.0)
Hemoglobin: 9.7 g/dL — ABNORMAL LOW (ref 12.0–15.0)
Lymphocytes Relative: 17 % (ref 12–46)
Lymphs Abs: 4.3 10*3/uL — ABNORMAL HIGH (ref 0.7–4.0)
MCH: 32.4 pg (ref 26.0–34.0)
MCHC: 33 g/dL (ref 30.0–36.0)
MCV: 98.3 fL (ref 78.0–100.0)
MYELOCYTES: 1 %
Metamyelocytes Relative: 3 %
Monocytes Absolute: 9.3 10*3/uL — ABNORMAL HIGH (ref 0.1–1.0)
Monocytes Relative: 37 % — ABNORMAL HIGH (ref 3–12)
NEUTROS ABS: 11.4 10*3/uL — AB (ref 1.7–7.7)
NEUTROS PCT: 41 % — AB (ref 43–77)
Platelets: 11 10*3/uL — CL (ref 150–400)
Promyelocytes Absolute: 0 %
RBC: 2.99 MIL/uL — AB (ref 3.87–5.11)
RDW: 25.6 % — ABNORMAL HIGH (ref 11.5–15.5)
WBC: 25 10*3/uL — ABNORMAL HIGH (ref 4.0–10.5)
nRBC: 11 /100 WBC — ABNORMAL HIGH

## 2013-10-22 LAB — COMPREHENSIVE METABOLIC PANEL
ALT: 85 U/L — AB (ref 0–35)
AST: 69 U/L — ABNORMAL HIGH (ref 0–37)
Albumin: 2.2 g/dL — ABNORMAL LOW (ref 3.5–5.2)
Alkaline Phosphatase: 273 U/L — ABNORMAL HIGH (ref 39–117)
BUN: 50 mg/dL — ABNORMAL HIGH (ref 6–23)
CALCIUM: 7 mg/dL — AB (ref 8.4–10.5)
CO2: 13 mEq/L — ABNORMAL LOW (ref 19–32)
Chloride: 107 mEq/L (ref 96–112)
Creatinine, Ser: 3.11 mg/dL — ABNORMAL HIGH (ref 0.50–1.10)
GFR calc Af Amer: 17 mL/min — ABNORMAL LOW (ref >90)
GFR calc non Af Amer: 15 mL/min — ABNORMAL LOW (ref >90)
Glucose, Bld: 76 mg/dL (ref 70–99)
Potassium: 4.1 mEq/L (ref 3.7–5.3)
SODIUM: 137 meq/L (ref 137–147)
TOTAL PROTEIN: 4.5 g/dL — AB (ref 6.0–8.3)
Total Bilirubin: 16.3 mg/dL — ABNORMAL HIGH (ref 0.3–1.2)

## 2013-10-22 MED ORDER — MORPHINE SULFATE 10 MG/ML IJ SOLN
INTRAMUSCULAR | Status: AC
  Filled 2013-10-22: qty 1

## 2013-10-22 NOTE — Progress Notes (Signed)
Clinical Social Work  CSW attempted to meet with patient but other staff assisting patient at this time. CSW continuing to follow to offer support and to assist with DC planning. MD please alert CSW if patient is appropriate for residential hospice placement.  [place], [place] [phone]

## 2013-10-22 NOTE — Progress Notes (Signed)
[facility] Visit: Pt very weak in bed, unable to vocalize even though attempting to do so, nodding head in response to interactions.  Pt joined by daughter Anderson Malta, sister Fraser Din (here from [place]), and close friend Remo Lipps.  Pt in some labored breathing and indicating she needed pain medication, and unit nurses provided medication and repositioning in bed.  Pt then able to listen well to affirmation of faith with Scripture and reassurance, and indicate she was at peace spiritually. Family holding back tears with spiritual affirmation. Family able to share some stories of pt's tenacity and determination.  Before leaving, Anderson Malta asked about any questions she had re "blue book" present in room, and she indicated she was OK for the present.  Carlus Pavlov, Chaplain, [facility]

## 2013-10-22 NOTE — Progress Notes (Addendum)
[facility] RN Visit- M. Wynetta Emery, RN  Related admission to [facility] diagnosis of Multiple myeloma. Pt is DNR, OOF DNR on shadow chart.  Pt intermittently alert, attempts to answer questions voice in a whisper, denied pain, however observed to be moving legs around in the bed, shaking head intermittently; mouth care given pt unable to stick out tongue did co-operate with mouth swab and asked for jello- although could not follow through to swallow once given bite of jello it laid in her mouth & had to be suctioned out. RR rate =22-24 on this visit appears to have more WOB -receiving scheduled Morphine nebs, has prn Morphine ordered as well as prn Ativan,  -Pt's sister and son Sudie Bailey at bedside, they voiced their observation of pt's decline now less talkative, sleeping more, more yellow, generalized body swelling; voiced patient has had a few nights where she was more anxious and asked about Ativan dosing; reinforced education on EOL s/sx and non-verbal s/sx they may see such as grimacing, restlessness, family agreed with pt's current increased WOB and intermittent restlessness to request dose of Ativan; staff RN Hinton Dyer aware and will follow up; emotional support offered, family voiced appreciation of care and support received   Per Dr. Antonieta Pert note, pt with continued decline requiring medication monitoring/adjustment for comfort; [facility] will continue to support pt/family   Please call [facility] @ [phone]- ask for RN Liaison or after hours,ask for on-call RN with any hospice needs.  Thank you. Danton Sewer, RN c [phone]

## 2013-10-22 NOTE — Progress Notes (Signed)
the decline for Ms.Ertel is obvious. She is more lethargic. She will not talk. She to her eyes. She's not eating. She does not seem to be in any pain. She's not having any dyspnea. We have on a scopolamine patch now. Her breathing seems to be very less labored.  There is no bleeding. Her platelet count is down to 11,000. I will hold on transfusing her.  I don't think she needs antibiotics a longer period we will discontinue his.  We will discontinue a lot of her medications that really are not necessary at this point.  Her son was with her. I spent the night. I told him that I thought that she might have another 3 days or so.  On her physical exam her temperature is 98.3. Pulse is 92. Blood pressure 94/66. Her lungs sound good today. She has good breath sounds bilaterally. Cardiac exam is regular rate and rhythm. Her rate is much lower than yesterday. I her no ectopic beats. Abdomen is soft. Bowel sounds are decreased. Her liver edge is about 1 cm below the right costal margin. Extremities shows some slight nonpitting edema of the right arm. Her PICC site is intact.  Her white cell count is now up to 45,000. I see more blasts. Hemoglobin 9.7. Her creatinine is now 3.11. Bilirubin 16.3.  Again, I just do not see her surviving more than 3 days. Her decline has been steady since last week.  Her family is very grateful for the care that she is received in the hospital. She was able to see her grandchildren yesterday. I am thankful that the hospital allow her to have her grandchildren come in despite the policy during flu season. This really made a huge intact on her and really get some comfort to her family.  [place]ump 55:22

## 2013-10-23 LAB — PATHOLOGIST SMEAR REVIEW

## 2013-10-23 MED ORDER — MORPHINE (PF) INJECTION FOR INHALATION 10 MG/ML: 10.0000 mg | RESPIRATORY_TRACT | Status: DC | PRN

## 2013-10-23 NOTE — Progress Notes (Signed)
[name] is close to passing now. She's not responding. She opens her on is on occasion.  There is no need for blood work now. We will make the nebulizers when necessary.  She has Kussmaul's respirations.  Her son-in-law was with her. I told him that I thought she had probably 2 days now. I expect that she should start to have periods of apnea with her breathing.  She is comfortable. There is no dyspnea. Her lungs are clear. Cardiac exam is slightly irregular.  Again, is no further need for blood work.  I suspect that she will pass on him 1-to 2 days. There've I spoke to her daughter yesterday and she definitely understands this and is not surprised.  The family is very appreciative of the great care that she is receiving on 5 E.!!!  Pete E.  Isaiah 12:2

## 2013-10-23 NOTE — Progress Notes (Signed)
Clinical Social Work  Per chart review, MD feels patient will have hospital death. Patient's hospice team continues to follow for support. CSW is signing off but available if any needs arise.  [place], [place] [phone]

## 2013-10-23 NOTE — Progress Notes (Signed)
Room [facility] RN Visit- M. Wynetta Emery, RN   Pt non-responsive to touch or voice on this visit; dtr Anderson Malta at bedside applying moisturizer to face- pt opened eyes briefly during this but non-verbal and quickly closed eyes again; Anderson Malta talked about how 'mom always took such good care of her skin and thought this would make her feel good' Pt with respiratory changes 5 second periods of apnea noted; RR=20; O2 3LNC in place;no audible upper airway congestion noted; feet cool pulses faint;  recent prn dose 2 mg IV Morphine given by staff RN Simona Huh at [phone]; Anderson Malta voiced she felt 'mom was close to passing' and voiced she felt her mom was comfortable at this time; Anderson Malta stated family members continue to take turns being with pt they do not want her to die alone; asked about need for comfort cart - family would be appreciative - staff CNA brought some soda and ice to room Charge RN will also pass along for comfort cart later today as patient appears to be transitioning to actively dying Related admission to [facility] diagnosis of Multiple myeloma. Pt is DNR, OOF DNR on shadow chart. [facility] will continue to follow for support pt/family  Please call [facility] @ [phone]- ask for RN Liaison or after hours,ask for on-call RN with any hospice needs.  Thank you. Danton Sewer, RN c [phone]

## 2013-10-24 MED ORDER — MORPHINE SULFATE 10 MG/ML IJ SOLN
1.0000 mg/h | INTRAVENOUS | Status: DC
  Administered 2013-10-24: 1 mg/h via INTRAVENOUS
  Filled 2013-10-24: qty 10

## 2013-10-24 MED ORDER — SODIUM CHLORIDE 0.9 % IV SOLN: 1.0000 mg/h | INTRAVENOUS | Status: DC

## 2013-10-25 NOTE — Discharge Summary (Signed)
#   [id number] is death summary.  Pete E.

## 2013-10-26 NOTE — Discharge Summary (Signed)
[name] NO.:  [id number]  MEDICAL RECORD NO.:  [id number]  LOCATION:  3086                         FACILITY:  [facility]  PHYSICIAN:  Volanda Napoleon, M.D.  DATE OF BIRTH:  10-20-1945  DATE OF ADMISSION:  November 02, 2013 DATE OF DISCHARGE:  10/26/2013                              DISCHARGE SUMMARY   DEATH SUMMARY  DIAGNOSES ON DEATH: 1. Refractory kappa light chain myeloma. 2. 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 3. 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 4. Influenza B infection. 5. Right lower lobe pneumonia.  HOSPITAL COURSE:  [name] was admitted from home.  She had refractory kappa light chain myeloma.  She then developed treatment related myelodysplasia that was transformed into acute myeloid leukemia. She had numerous cytogenetic abnormalities.  She was no longer a candidate for any intervention to try to help with the myeloma or the myelodysplasia, and we had her on hospice.  She was admitted on 29th.  She had shortness of breath.  She had diarrhea.  She had nausea and vomiting.  She was found to be positive for influenza B.  She was placed on Tamiflu.  She had a blood culture. These were negative.  Urine culture was also negative.  Stool was also negative for C. diff.  She was initially placed in the ICU.  I spoke to the family at length.  They are very nice.  They are very reasonable.  They certainly understood the situation that [name] was dealing with.  We made [name] a do not resuscitate.  She did have a double-lumen PICC line placed into her right arm.  This was for IV access.  I felt this was something that would help with the fluids and antibiotics that she would need.  When she was admitted, she had marked hepatic dysfunction.  Her bilirubin was 6.4 on admission, this went up rapidly.  On January 1st, 2015, it was  up to 11.6.  On January 5th, 2015, it was up to 16.3.  I felt that the hepatic failure was all reflective of infiltration by either myeloma cells or leukemia cells.  She did have an abdominal ultrasound done.  Her liver was enlarged. There was heterogeneous parenchymal echo texture.  It was felt that this represented either "cirrhosis or geographic fatty infiltration".  She also had some splenomegaly.  We found that she was anemic and thrombocytopenic on admission.  Again, this was all reflective of her mild dysplasia with transformation over to acute leukemia, in addition to her plasma cell issue that was progressing.  We did transfuse her with platelets to help with bleeding. She did receive 2 units of packed red blood cells during the hospital stay.  We transfused her with platelets on 3 occasions.  Again, this was to minimize bleeding issues.  We were able to move her up to the 5th floor.  The hospitalists were very, very kind in helping [place] out with her management.  We had her on broad-spectrum antibiotics in addition to the Tamiflu.  We then found that she had developed pneumonia in her right lung.  A chest x-ray was done on January 1st.  This showed new right upper and right lower lobe airspace disease.  Again, her immune system was pretty much nonfunctional so the development of pneumonia was clearly the "last straw" for [name] that she would not be able to overcome.  We put her on nebulizers.  I ultimately put on some morphine nebulizers to help with her breathing.  She was really not coughing up all that much secretions.  She was not coughing up any blood which was a blessing.  Her appetite began to decline.  She just got weaker.  She was really not getting out of bed.  We clearly were focusing on comfort care.  Her quality of life was of primary importance.  Her family clearly agreed with this.  We continued to manage her up on [place], the hospitalists  were very instrumental in helping [place] out with her care.  She was then transferred over to my service on January 3rd.  Her decline continued.  She then became more lethargic.  This was all reflective of her liver failure.  Her kidneys then began to have worsening function.  Her urine output began to decline.  She basically went to coma like condition on the 5th.  She maybe opened her eyes up a little bit.  For the most part, she was sleeping.  She then began to have some periods of apnea on the morning of the 7th. She still appeared to be fairly comfortable.  She had a good air movement in her lungs.  I really do not hear much in the way of wheezing.  She began to have a little bit of agitation on the afternoon of the 7th. We then started her on a low-dose morphine infusion at 1 mg an hour for comfort and dyspnea.  Her family was incredibly attentive.  They were with her the entire time.  The hospital did a great job and allowed her grandkids to see her on the 4th while she was still somewhat alert.  I really applaud the hospital for reaching out to the patient and her family during her last few day and make an exception to the policy with no kids under 57 years old because of the flu.  This really made a huge difference for [name] and her family.  [name] passed away quietly on the evening of January 7th at 9:10 p.m.  Her family was with her.  With deep regrets, this is the death summary for [name].     Volanda Napoleon, M.D.     PRE/MEDQ  D:  11/15/2013  T:  10/19/2013  Job:  [id number]

## 2013-11-18 NOTE — Progress Notes (Signed)
[name] continues her decline. She is having some periods of apnea with her breathing. She is less responsive. She may of her eyes a little bit.  She's not in pain. There is no dyspnea. She has no bleeding. We stopped doing labs.  Her blood pressure is dropping slowly but surely. Today her blood pressure is 87/ 56.  Her lungs are clear. She is good air movement bilaterally. This wheezes which are mild. Cardiac exam is regular rate and rhythm. Abdomen is soft.  I suspect that she has no more than 2 days at this point.  Her son in law was with [place] this morning. He is not surprised by this.  We will continue comfort care.  Lum Keas  2 Timothy 4:16-18

## 2013-11-18 NOTE — Progress Notes (Addendum)
[facility] [place] [facility] RN Visit-R.Sharnelle Cappelli RN Related admission to [facility] diagnosis of multiple myeloma.   Pt is DNR code.  Pt able to open her eyes only, then returns to sleep.  Pt lying in bed, appears to be working hard to breathe - pt's head moves 90 degrees with every inhalation and accessory muscles (raising L/shoulder) to breath - discussed with Diplomatic Services operational officer.  Pt has MS q/1-2 hrs for pain,  Recommend calling Dr. Johnette Abraham and asking for MS drip for pt's increased comfort and family requested "scheduled" so they do not have to ask every time for her medications.  with complaints of pain or discomfort.   Son and son in law present.    Pt has increased jaundice appearance,  RUE bruised below PICC.   UPDATE: Discussed with staff RN Simona Huh - he asked if this writer would call Dr. Johnette Abraham.  Discussed via phone with Dr. Johnette Abraham and described pts WOB - he stated he would take care of it.  Advised staff RN and family.   Please call [facility] @ [phone]- ask for RN Liaison or after hours,ask for on-call RN with any hospice needs.   Thank you.  Vista Lawman, RN  [facility]  Hospice Liaison  [phone])

## 2013-11-18 NOTE — Progress Notes (Signed)
[facility] MSW note: This is a hospice related admission. Pt is a DNR. Pt did not respond to MSW. Pt appears to be actively dying. Pt did open her eyes when staff RN-Dennis was doing mouth care. Pt quickly closed her eyes. Dr. Marin Olp has ordered a morphine drip according to Simona Huh, Diplomatic Services operational officer. Son and son in law present on visit. Both aware of impending death. Son in law discussed bereavement concerns for daughter. MSW educated both on [facility] bereavement services. MSW discussed the importance of having closure with pt. MSW offered emotional support and education.   [name], MSW [phone]

## 2013-11-18 NOTE — Progress Notes (Signed)
[facility] Chaplain Visit: Met with pt's daughter jennifer as she sat at bedside.  Pt unresponsive and in end-stage breathing, appearing comfortable and in no distress.  Daughter thankful for care, and was more open and talkative in visit today about pt's life and personality as chaplain encouraged thoughts about how the memorial service will be conduct in [place].  Daughter amazed pt has lasted this long, and can not think of anything she would be waiting for.  Discussed spiritual issues and questions, and how to refer to pt dying to young grandchildren.  Daughter encouraged to know hospice team is available as family needs. Carlus Pavlov, ThM, [place]

## 2013-11-18 NOTE — Progress Notes (Signed)
Patient expired at 2110. Attending MD/AC notified. Family at bedside, funeral home notified. As per family, Will transport body to morgue after family vistatiion

## 2013-11-18 DEATH — deceased

## 2014-02-12 IMAGING — CR DG CHEST 2V
2 series · 2 of 2 positions shown · non-contrast
Comparison: 07/13/2011

CLINICAL DATA: cough, shortness of breath.

CHEST - 2 VIEW

[w chest pa]
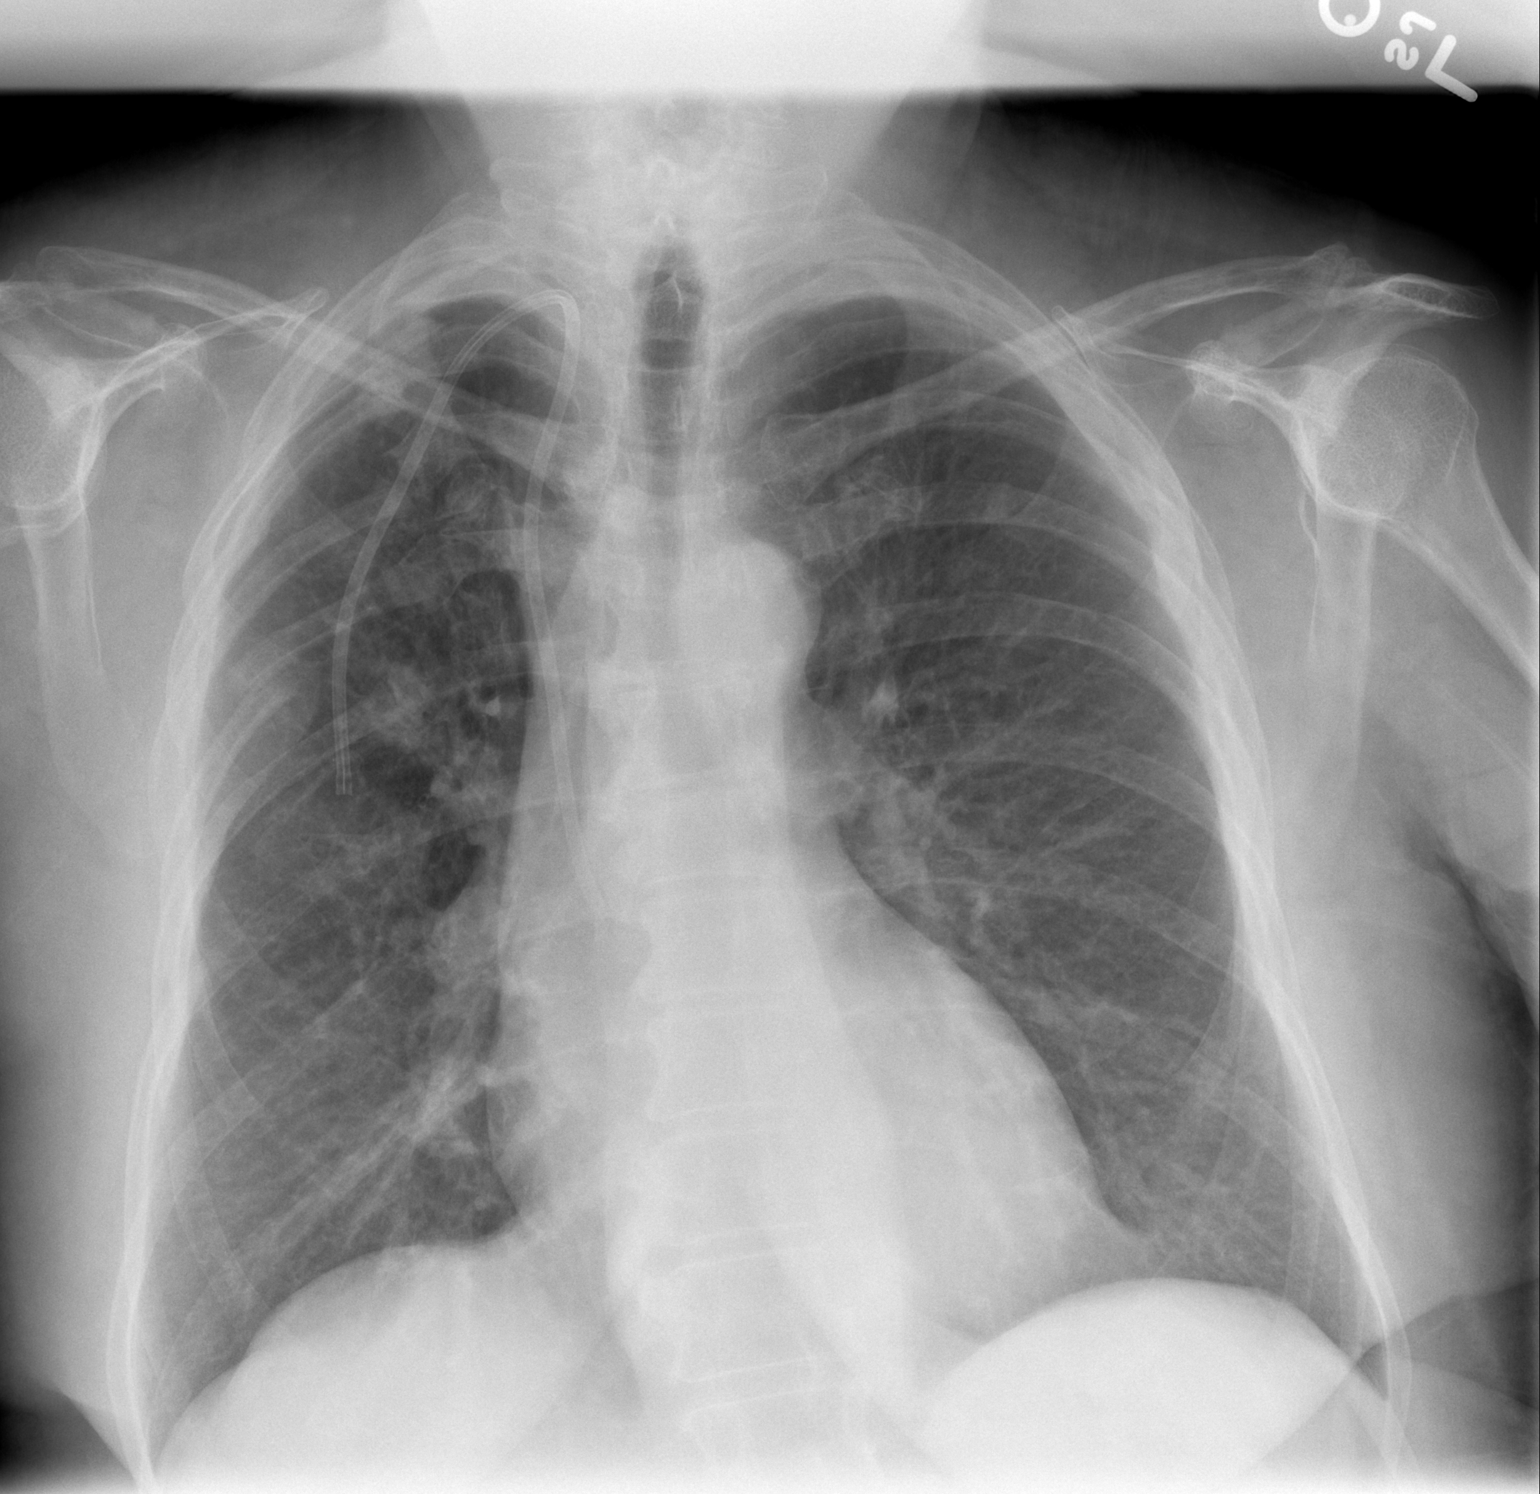

[w chest lat]
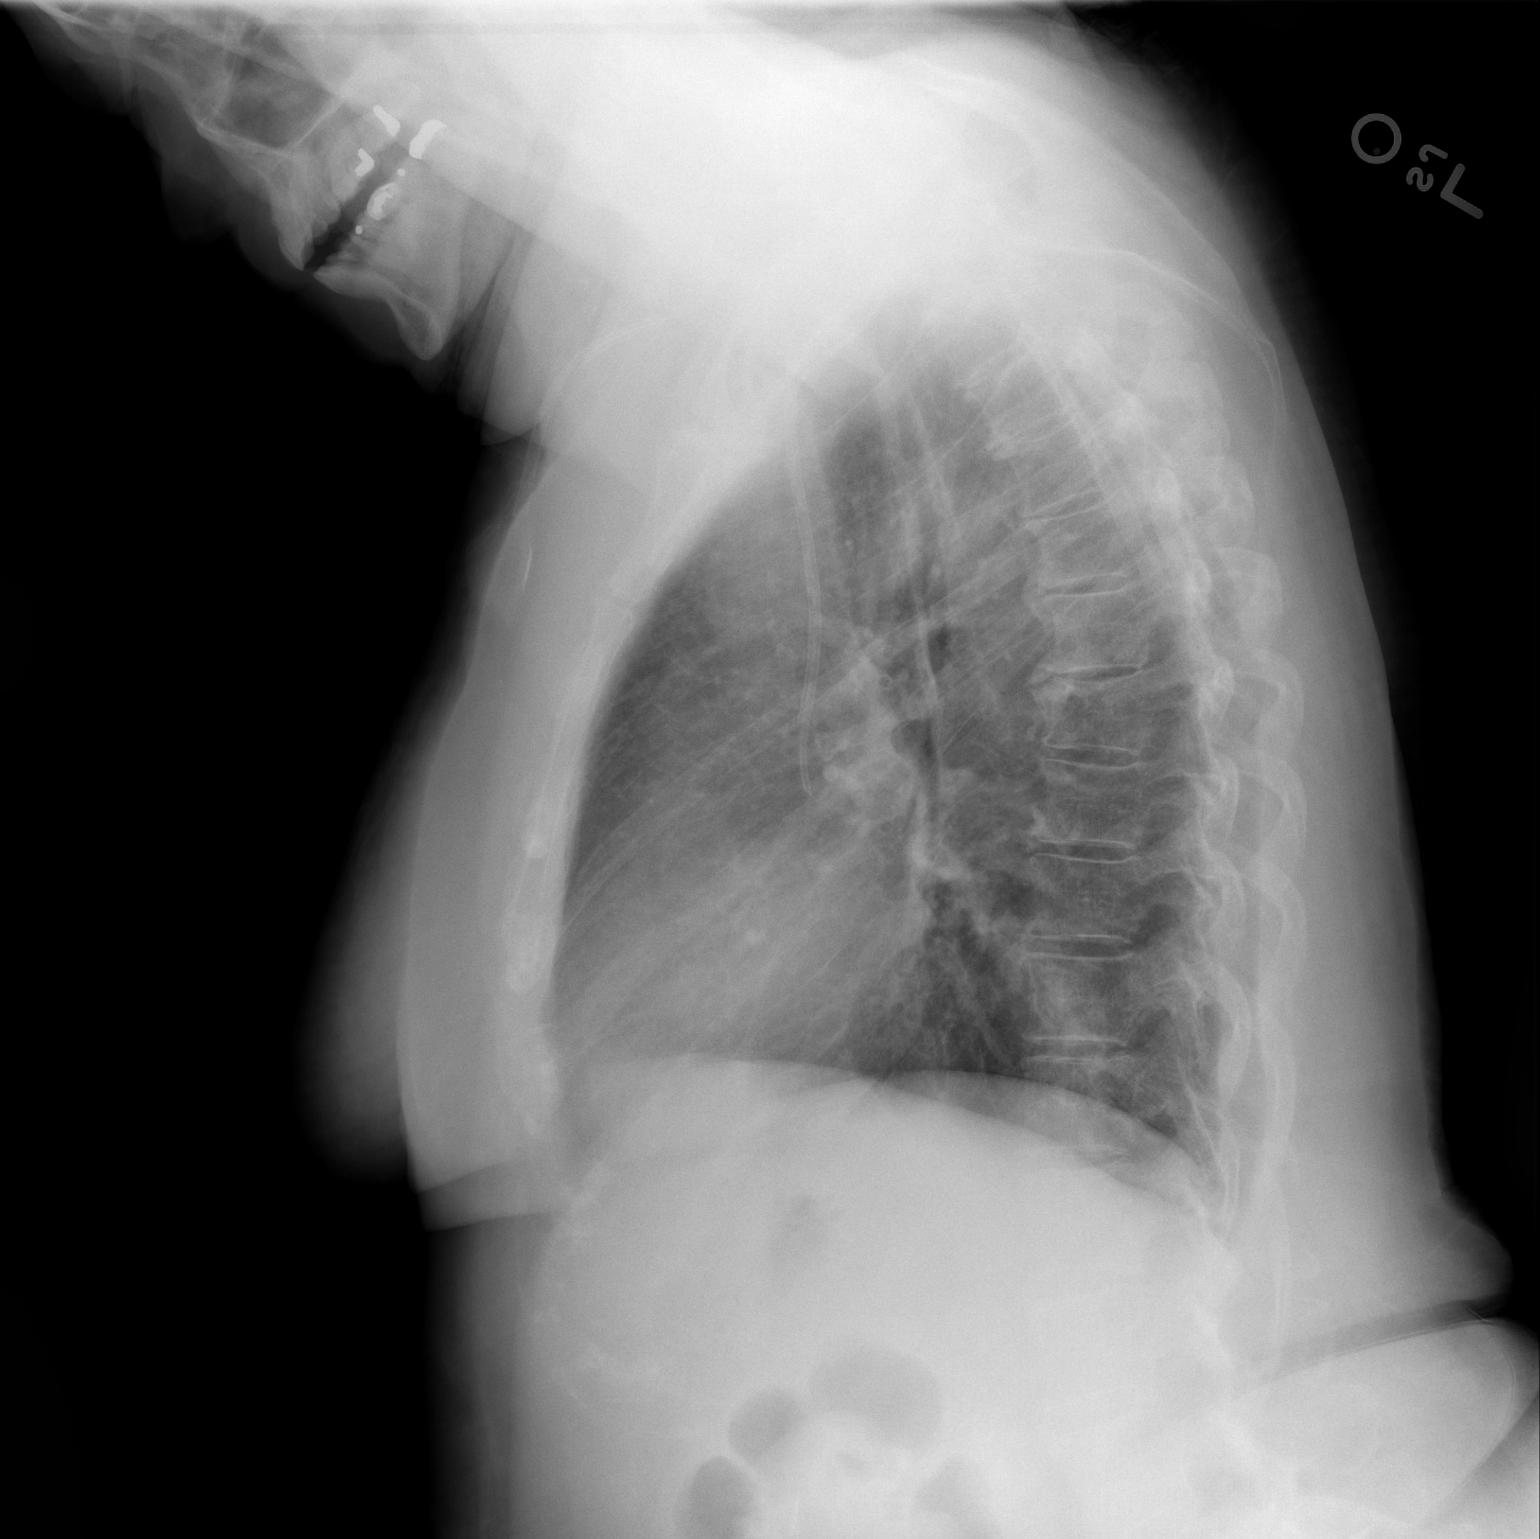

[2 of 2 positions shown; findings below may reference images not displayed]

FINDINGS: Right Port-A-Cath remains in place, unchanged.  Heart is
normal size.  Mild hyperinflation of the lungs.  Multiple old
bilateral rib fractures are noted.  It is difficult to exclude that
any of these fractures could be new or acute.

Again noted is a lucency within the distal left clavicle, stable,
likely related to patient's known myeloma.  No confluent airspace
opacities.  No effusions.  Degenerative changes in the thoracic
spine.
IMPRESSION: Mild hyperinflation of the lungs.  No active cardiopulmonary
disease.

Multiple bilateral rib fractures which appear old.  Difficult to
completely exclude acute fractures.

## 2014-03-09 IMAGING — CR DG ABDOMEN ACUTE W/ 1V CHEST
3 series · 3 of 3 positions shown · non-contrast
Comparison: Chest x-ray dated 01/13/2012 and a CT scan of the
abdomen and pelvis dated 08/12/2010

CLINICAL DATA: Diffuse abdominal pain and distention.
Constipation.

ACUTE ABDOMEN SERIES (ABDOMEN 2 VIEW & CHEST 1 VIEW)

[w chest pa]
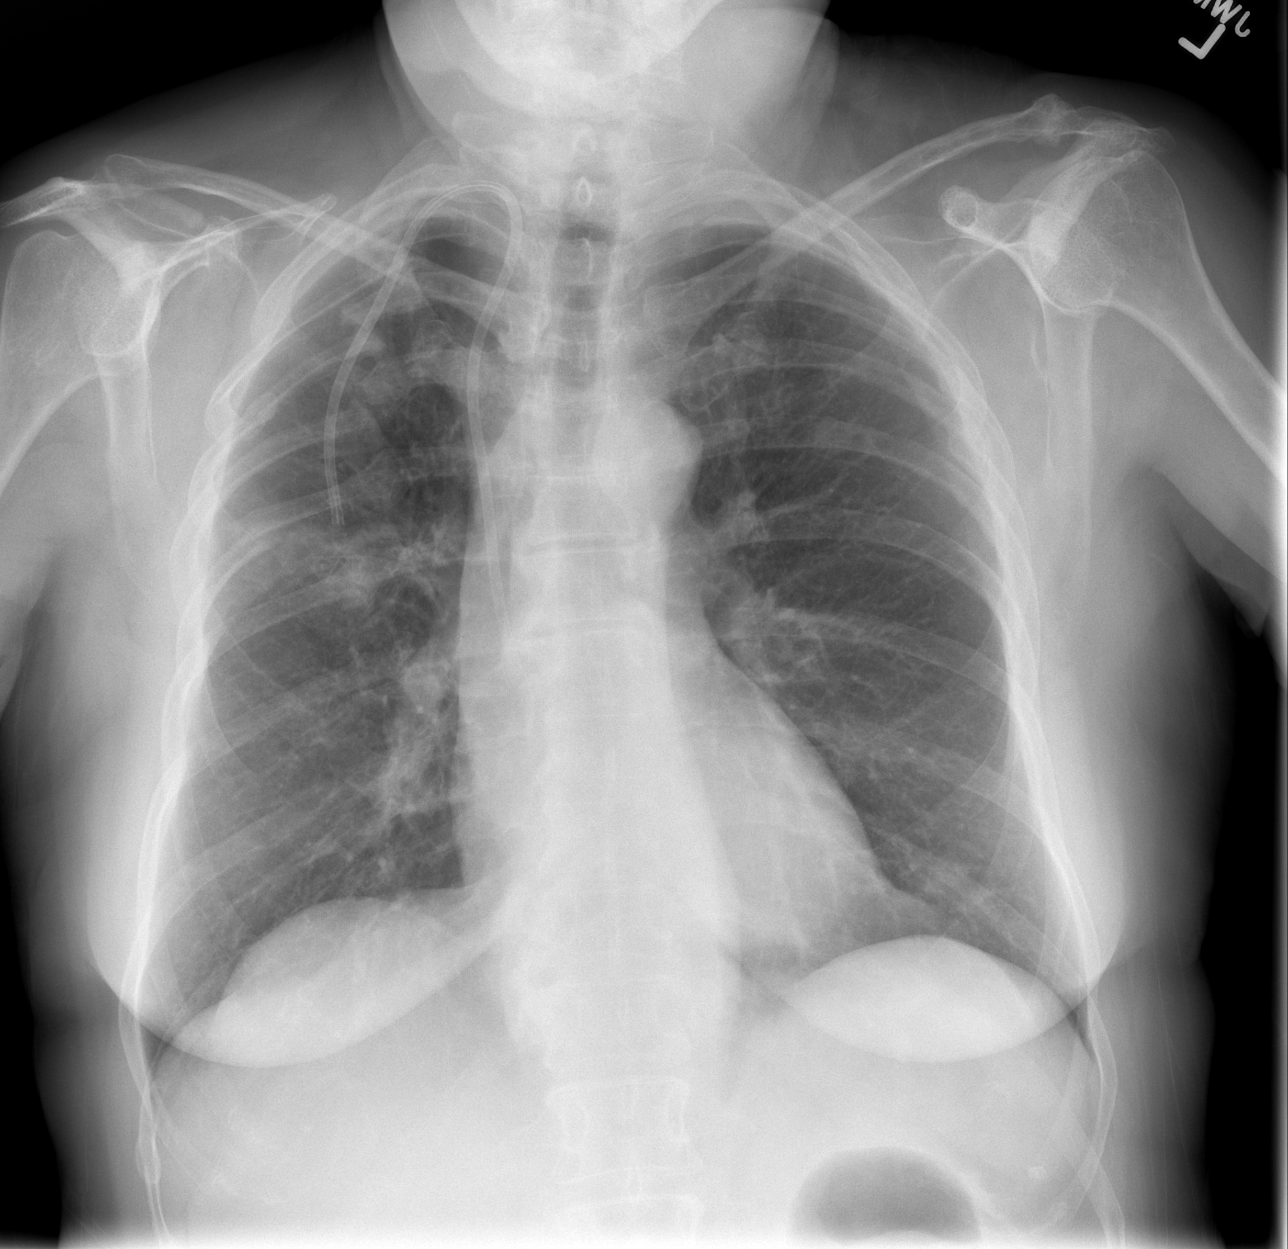

[w abdomen upright]
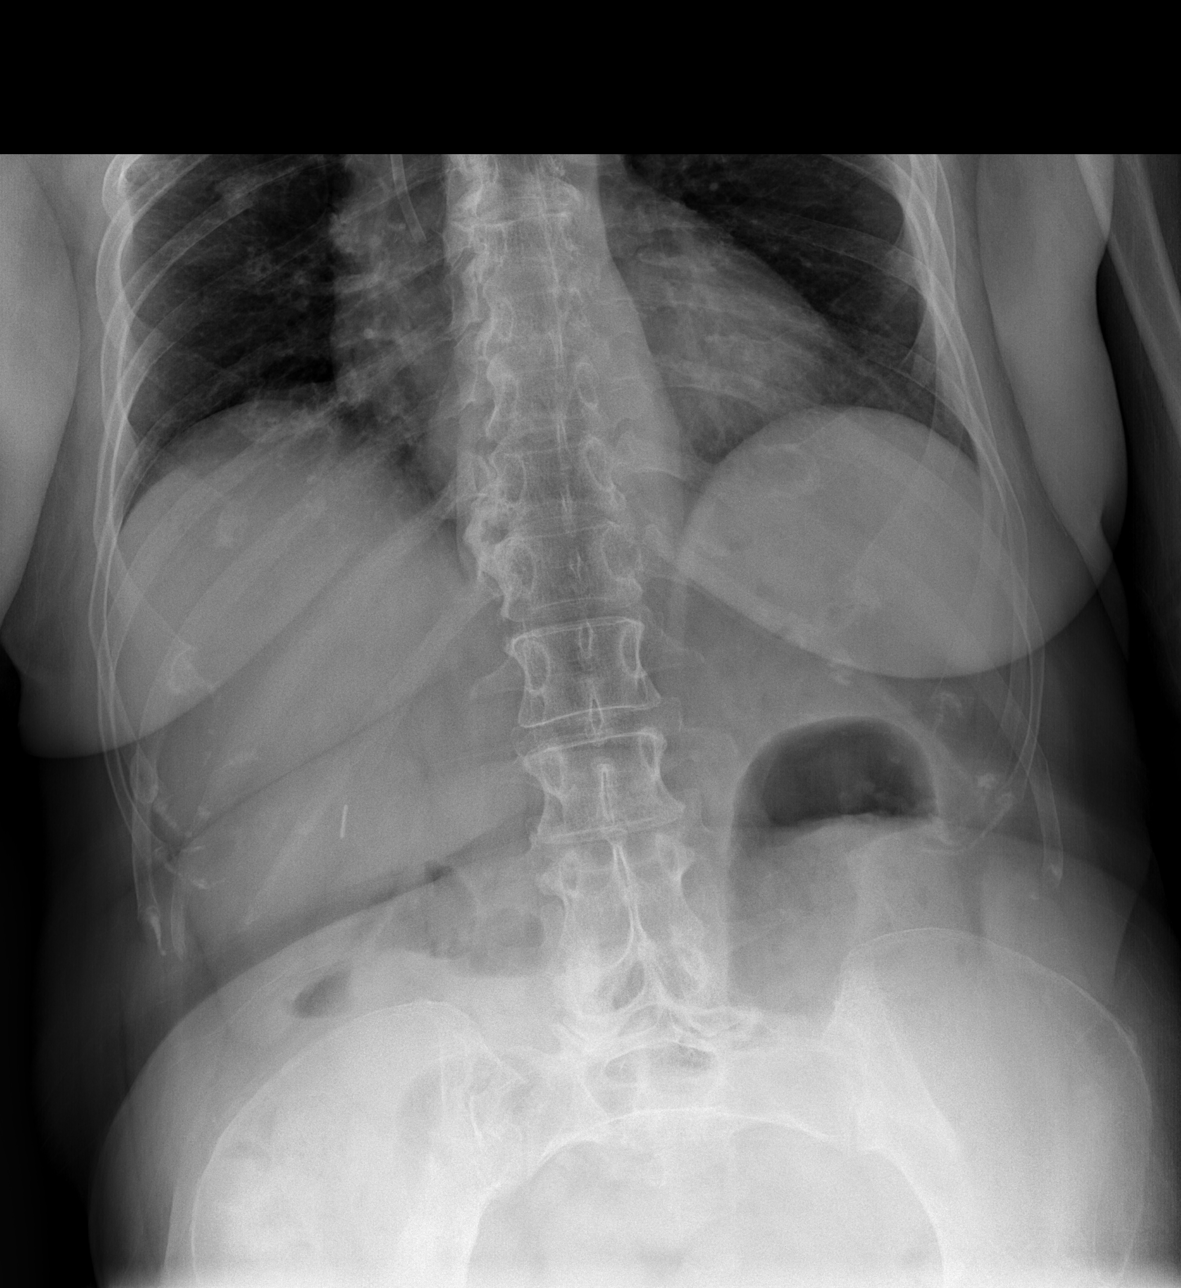

[t abdomen supine]
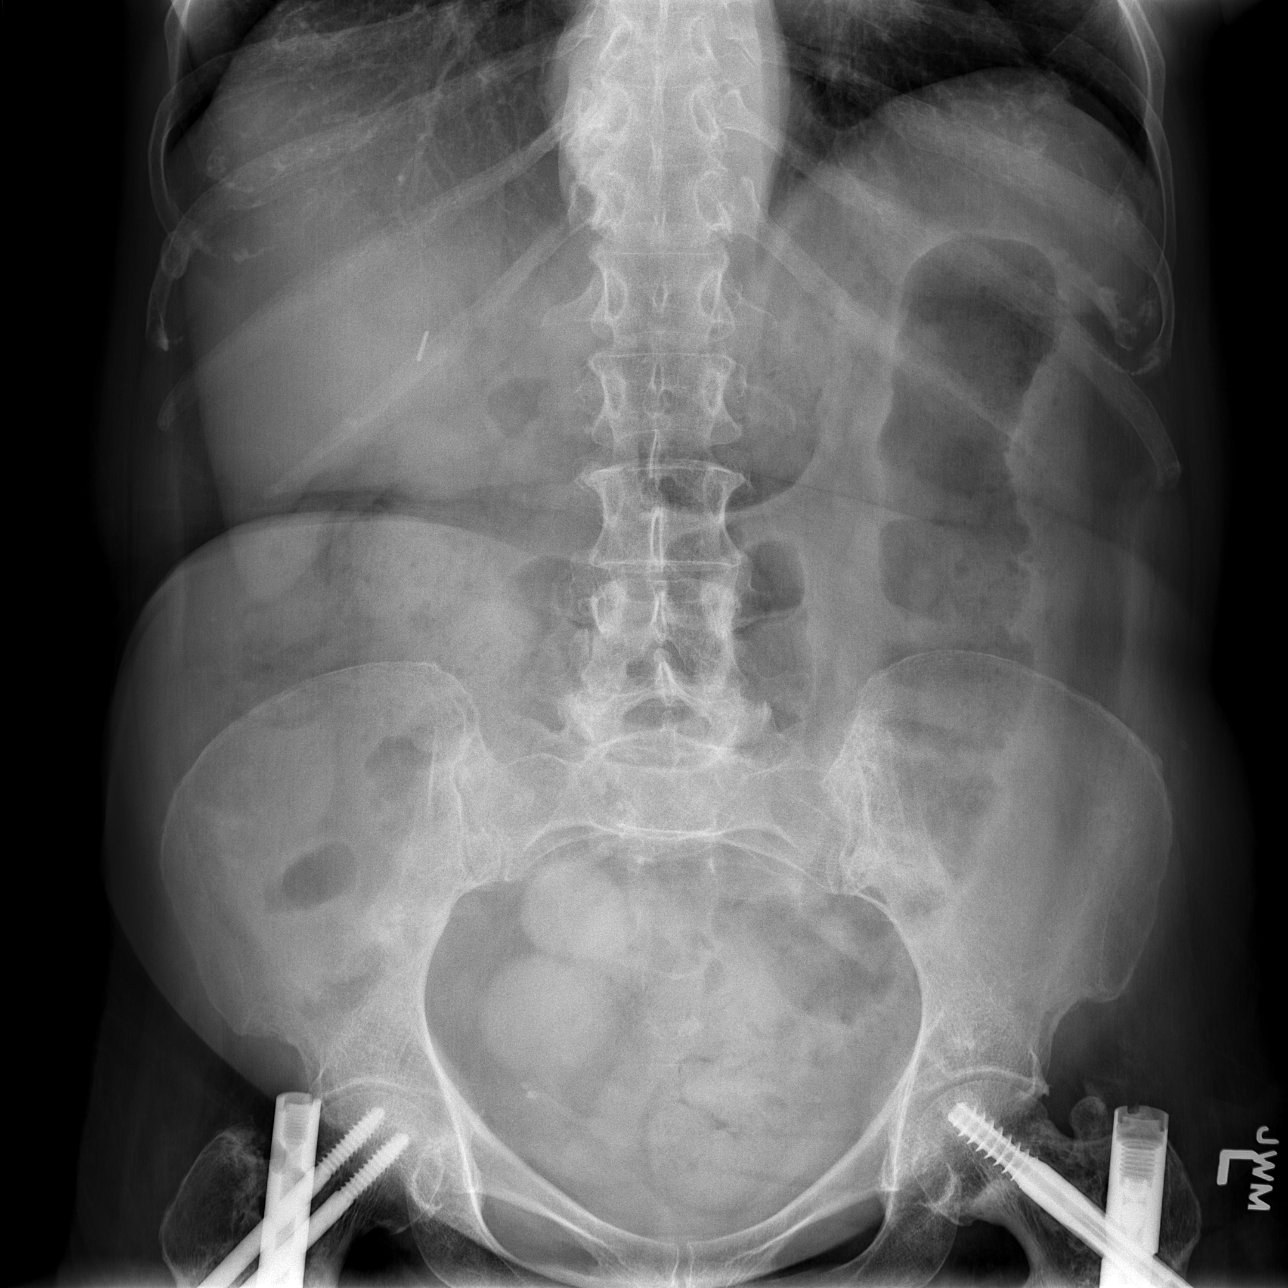

[3 of 3 positions shown; findings below may reference images not displayed]

FINDINGS: The heart size and vascularity are normal.  No acute
infiltrates or effusions.  Multiple old bilateral rib fractures
with scarring in the right midzone posteriorly adjacent to the
fractures.

No free air or free fluid in the abdomen.  Bowel gas pattern is
normal.  Evidence consistent with previous cholecystectomy.
Bilateral hip pinnings.
IMPRESSION: No acute abnormalities of the abdomen or chest.

## 2014-06-14 ENCOUNTER — Other Ambulatory Visit: Payer: Self-pay | Admitting: *Deleted
